# Patient Record
Sex: Female | Born: 1935 | Race: Black or African American | Hispanic: No | Smoking: Former smoker
Health system: Southern US, Community
[De-identification: ages and names within clinical notes are randomized; demographics above are authoritative.]

## PROBLEM LIST (undated history)

## (undated) DIAGNOSIS — M1990 Unspecified osteoarthritis, unspecified site: Secondary | ICD-10-CM

## (undated) DIAGNOSIS — I639 Cerebral infarction, unspecified: Secondary | ICD-10-CM

## (undated) DIAGNOSIS — D649 Anemia, unspecified: Secondary | ICD-10-CM

## (undated) DIAGNOSIS — Z9289 Personal history of other medical treatment: Secondary | ICD-10-CM

## (undated) DIAGNOSIS — M109 Gout, unspecified: Secondary | ICD-10-CM

## (undated) DIAGNOSIS — I10 Essential (primary) hypertension: Secondary | ICD-10-CM

## (undated) DIAGNOSIS — I4891 Unspecified atrial fibrillation: Secondary | ICD-10-CM

## (undated) DIAGNOSIS — I82409 Acute embolism and thrombosis of unspecified deep veins of unspecified lower extremity: Secondary | ICD-10-CM

## (undated) DIAGNOSIS — E119 Type 2 diabetes mellitus without complications: Secondary | ICD-10-CM

## (undated) DIAGNOSIS — H409 Unspecified glaucoma: Secondary | ICD-10-CM

## (undated) DIAGNOSIS — N289 Disorder of kidney and ureter, unspecified: Secondary | ICD-10-CM

## (undated) DIAGNOSIS — I6529 Occlusion and stenosis of unspecified carotid artery: Secondary | ICD-10-CM

## (undated) DIAGNOSIS — K759 Inflammatory liver disease, unspecified: Secondary | ICD-10-CM

## (undated) DIAGNOSIS — E7800 Pure hypercholesterolemia, unspecified: Secondary | ICD-10-CM

## (undated) DIAGNOSIS — I739 Peripheral vascular disease, unspecified: Secondary | ICD-10-CM

## (undated) DIAGNOSIS — I2510 Atherosclerotic heart disease of native coronary artery without angina pectoris: Secondary | ICD-10-CM

## (undated) DIAGNOSIS — N182 Chronic kidney disease, stage 2 (mild): Secondary | ICD-10-CM

## (undated) HISTORY — PX: JOINT REPLACEMENT: SHX530

## (undated) HISTORY — PX: CORONARY ARTERY BYPASS GRAFT: SHX141

## (undated) HISTORY — PX: CARDIAC CATHETERIZATION: SHX172

## (undated) HISTORY — PX: CATARACT EXTRACTION W/ INTRAOCULAR LENS  IMPLANT, BILATERAL: SHX1307

## (undated) HISTORY — PX: CORONARY ANGIOPLASTY: SHX604

---

## 1989-07-22 HISTORY — PX: REPLACEMENT TOTAL KNEE: SUR1224

## 2013-11-21 HISTORY — PX: ATRIAL FIBRILLATION ABLATION: SHX5732

## 2013-11-21 HISTORY — PX: CAROTID ENDARTERECTOMY: SUR193

## 2014-08-21 DIAGNOSIS — I639 Cerebral infarction, unspecified: Secondary | ICD-10-CM

## 2014-08-21 HISTORY — DX: Cerebral infarction, unspecified: I63.9

## 2014-08-31 ENCOUNTER — Emergency Department (HOSPITAL_COMMUNITY)
Admission: EM | Admit: 2014-08-31 | Discharge: 2014-08-31 | Disposition: A | Discharge status: Home / Self Care | Payer: Medicare (Managed Care) | Source: Home / Self Care | Attending: Emergency Medicine | Admitting: Emergency Medicine

## 2014-08-31 ENCOUNTER — Encounter (HOSPITAL_COMMUNITY): Payer: Self-pay | Admitting: Emergency Medicine

## 2014-08-31 ENCOUNTER — Emergency Department (HOSPITAL_COMMUNITY): Payer: Medicare (Managed Care)

## 2014-08-31 ENCOUNTER — Emergency Department (INDEPENDENT_AMBULATORY_CARE_PROVIDER_SITE_OTHER)
Admission: EM | Admit: 2014-08-31 | Discharge: 2014-08-31 | Disposition: A | Discharge status: Nursing Home (Includes ICF) | Payer: PRIVATE HEALTH INSURANCE | Source: Home / Self Care | Attending: Family Medicine | Admitting: Family Medicine

## 2014-08-31 DIAGNOSIS — Z7901 Long term (current) use of anticoagulants: Secondary | ICD-10-CM

## 2014-08-31 DIAGNOSIS — R410 Disorientation, unspecified: Secondary | ICD-10-CM

## 2014-08-31 DIAGNOSIS — Z87448 Personal history of other diseases of urinary system: Secondary | ICD-10-CM

## 2014-08-31 DIAGNOSIS — I4891 Unspecified atrial fibrillation: Secondary | ICD-10-CM | POA: Insufficient documentation

## 2014-08-31 DIAGNOSIS — Z79899 Other long term (current) drug therapy: Secondary | ICD-10-CM | POA: Insufficient documentation

## 2014-08-31 DIAGNOSIS — E119 Type 2 diabetes mellitus without complications: Secondary | ICD-10-CM | POA: Insufficient documentation

## 2014-08-31 DIAGNOSIS — M549 Dorsalgia, unspecified: Secondary | ICD-10-CM | POA: Insufficient documentation

## 2014-08-31 DIAGNOSIS — Z792 Long term (current) use of antibiotics: Secondary | ICD-10-CM

## 2014-08-31 DIAGNOSIS — N3000 Acute cystitis without hematuria: Secondary | ICD-10-CM | POA: Insufficient documentation

## 2014-08-31 DIAGNOSIS — R4182 Altered mental status, unspecified: Secondary | ICD-10-CM | POA: Insufficient documentation

## 2014-08-31 DIAGNOSIS — M5416 Radiculopathy, lumbar region: Secondary | ICD-10-CM

## 2014-08-31 DIAGNOSIS — Z7982 Long term (current) use of aspirin: Secondary | ICD-10-CM | POA: Insufficient documentation

## 2014-08-31 DIAGNOSIS — G9340 Encephalopathy, unspecified: Secondary | ICD-10-CM

## 2014-08-31 DIAGNOSIS — I615 Nontraumatic intracerebral hemorrhage, intraventricular: Secondary | ICD-10-CM | POA: Diagnosis not present

## 2014-08-31 HISTORY — DX: Unspecified atrial fibrillation: I48.91

## 2014-08-31 HISTORY — DX: Disorder of kidney and ureter, unspecified: N28.9

## 2014-08-31 LAB — CBC
HEMATOCRIT: 32.6 % — AB (ref 36.0–46.0)
HEMOGLOBIN: 10.4 g/dL — AB (ref 12.0–15.0)
MCH: 29.3 pg (ref 26.0–34.0)
MCHC: 31.9 g/dL (ref 30.0–36.0)
MCV: 91.8 fL (ref 78.0–100.0)
Platelets: 204 10*3/uL (ref 150–400)
RBC: 3.55 MIL/uL — ABNORMAL LOW (ref 3.87–5.11)
RDW: 17.4 % — ABNORMAL HIGH (ref 11.5–15.5)
WBC: 9.1 10*3/uL (ref 4.0–10.5)

## 2014-08-31 LAB — COMPREHENSIVE METABOLIC PANEL
ALT: 16 U/L (ref 0–35)
ANION GAP: 13 (ref 5–15)
AST: 18 U/L (ref 0–37)
Albumin: 3.5 g/dL (ref 3.5–5.2)
Alkaline Phosphatase: 73 U/L (ref 39–117)
BUN: 24 mg/dL — ABNORMAL HIGH (ref 6–23)
CALCIUM: 9.1 mg/dL (ref 8.4–10.5)
CO2: 23 mEq/L (ref 19–32)
Chloride: 108 mEq/L (ref 96–112)
Creatinine, Ser: 1.37 mg/dL — ABNORMAL HIGH (ref 0.50–1.10)
GFR, EST AFRICAN AMERICAN: 40 mL/min — AB (ref >90)
GFR, EST NON AFRICAN AMERICAN: 34 mL/min — AB (ref >90)
GLUCOSE: 99 mg/dL (ref 70–99)
Potassium: 4.1 mEq/L (ref 3.7–5.3)
Sodium: 144 mEq/L (ref 137–147)
TOTAL PROTEIN: 7.5 g/dL (ref 6.0–8.3)
Total Bilirubin: 0.3 mg/dL (ref 0.3–1.2)

## 2014-08-31 LAB — PROTIME-INR
INR: 1.84 — AB (ref 0.00–1.49)
Prothrombin Time: 21.3 seconds — ABNORMAL HIGH (ref 11.6–15.2)

## 2014-08-31 LAB — URINALYSIS, ROUTINE W REFLEX MICROSCOPIC
BILIRUBIN URINE: NEGATIVE
Glucose, UA: NEGATIVE mg/dL
Ketones, ur: NEGATIVE mg/dL
LEUKOCYTES UA: NEGATIVE
Nitrite: POSITIVE — AB
Specific Gravity, Urine: 1.019 (ref 1.005–1.030)
Urobilinogen, UA: 0.2 mg/dL (ref 0.0–1.0)
pH: 5.5 (ref 5.0–8.0)

## 2014-08-31 LAB — CBG MONITORING, ED: Glucose-Capillary: 94 mg/dL (ref 70–99)

## 2014-08-31 LAB — URINE MICROSCOPIC-ADD ON

## 2014-08-31 LAB — I-STAT CG4 LACTIC ACID, ED: Lactic Acid, Venous: 0.7 mmol/L (ref 0.5–2.2)

## 2014-08-31 LAB — AMMONIA: Ammonia: 21 umol/L (ref 11–60)

## 2014-08-31 MED ORDER — ONDANSETRON HCL 4 MG/2ML IJ SOLN
4.0000 mg | Freq: Once | INTRAMUSCULAR | Status: AC
  Administered 2014-08-31: 4 mg via INTRAVENOUS
  Filled 2014-08-31: qty 2

## 2014-08-31 MED ORDER — ACETAMINOPHEN 500 MG PO TABS
500.0000 mg | ORAL_TABLET | Freq: Once | ORAL | Status: AC
Start: 2014-08-31 — End: 2014-08-31
  Administered 2014-08-31: 500 mg via ORAL
  Filled 2014-08-31: qty 1

## 2014-08-31 MED ORDER — CEPHALEXIN 500 MG PO CAPS: 500.0000 mg | ORAL_CAPSULE | Freq: Four times a day (QID) | ORAL | Status: DC

## 2014-08-31 MED ORDER — DEXTROSE 5 % IV SOLN
1.0000 g | Freq: Once | INTRAVENOUS | Status: AC
  Administered 2014-08-31: 1 g via INTRAVENOUS
  Filled 2014-08-31: qty 10

## 2014-08-31 NOTE — ED Notes (Signed)
The pt vomited after she received the tylenol po.  zofran given for  Nausea.  Family wants to speak with the edp before she is discharged

## 2014-08-31 NOTE — ED Provider Notes (Signed)
Susan Calhoun is a 80 y.o. female who presents to Urgent Care today for right leg pain. Patient is visiting from [place]. She has a history of atrial fibrillation status post ablation currently on warfarin. For the past several days she's had pain radiating from her back down her right leg however this morning she became more confused according to her caregivers and was a little more unsteady on her feet. She denies any chest pains or palpitations or shortness of breath. Her caregivers note that she forgot how to take her medications this morning and cannot remember that she are to them. This is very uncharacteristic of her. No new medications. She notes that she's been having trouble managing her warfarin recently.   Past Medical History  Diagnosis Date  . Diabetes mellitus without complication   . A-fib   . Renal disorder    History  Substance Use Topics  . Smoking status: Never Smoker   . Smokeless tobacco: Not on file  . Alcohol Use: No   ROS as above Medications: No current facility-administered medications for this encounter.   No current outpatient prescriptions on file.    Exam:  BP 166/44  Pulse 58  Temp(Src) 98.7 F (37.1 C) (Oral)  Resp 18  SpO2 99% Gen: Well NAD HEENT: EOMI,  MMM Lungs: Normal work of breathing. CTABL Heart: RRR no MRG Abd: NABS, Soft. Nondistended, Nontender Exts: Brisk capillary refill, warm and well perfused.  Neuro: Alert and oriented to person and place. Patient states that it is September 1975 and on a Sunday. She is not sure about the date of the month.  Back: Nontender to spinal midline Patient is able to stand up on her own and stand on her heels and toes. She is able to squat a little bit. Reflexes are equal bilateral knees and ankles. Sensation is intact throughout Patient has normal bilateral intact strength upper extremities.  No results found for this or any previous visit (from the past 24 hour(s)). No results  found.  Assessment and Plan: 80 y.o. female with right leg radicular pain in the setting of questionably controlled INR and new confusion. The differential at this time is quite large. Plan to transfer to the emergency department for evaluation and management of this issue.  Discussed warning signs or symptoms. Please see discharge instructions. Patient expresses understanding.     Rodolph BongEvan S Corey, MD 08/31/14 [phone]

## 2014-08-31 NOTE — Discharge Instructions (Signed)

## 2014-08-31 NOTE — ED Notes (Addendum)
Family states she is typically independent and has noticed since Wednesday that she has been disoriented, incontinent, and forgetting about her medications. Pt is diabetic, hx of afib, oot from [place] visiting family. Family states this is not her usual. Pt also states the back of both of her legs hurt. Denies any SOB, cp, or palpitations.

## 2014-08-31 NOTE — ED Provider Notes (Signed)
80 y.o. Female with leg pain and some confusion over the past 36 hours.  NO report of trauma or fever.  Patient taking po without difficulty.  Filed Vitals:   08/31/14 2253  BP: 175/59  Pulse: 71  Temp: 98.9 F (37.2 C)  Resp:    WDWN elderly female nad Patient is oriented to person and place but not to date.   Hr- rrr Abdomen- soft nontender Lower extremities with full arom, dtr equal, sensation intact Back- no deformity noted  Patient with le positive, bacteria and wbc on urinalysis.  Patient to be treated for uti.  Family given strict return precautions and need for follow up.  I saw and evaluated the patient, reviewed the resident's note and I agree with the findings and plan.    Hilario Quarryanielle S Avonte Sensabaugh, MD 08/31/14 [phone]

## 2014-08-31 NOTE — ED Notes (Signed)
Pt triaged and assessed by provider 

## 2014-08-31 NOTE — ED Provider Notes (Signed)
CSN: [id number]     Arrival date & time 08/31/14  1738 History   First MD Initiated Contact with Patient 08/31/14 1848     Chief Complaint  Patient presents with  . Leg Pain  . Altered Mental Status     (Consider location/radiation/quality/duration/timing/severity/associated sxs/prior Treatment) HPI Comments: Patient is 80 year old female who is a transfer from urgent care. 10 days ago patient came from [place] to visit family. She's been having bilateral back pain radiating into her thighs she's had before. Denies any numbness or weakness of her legs. Is still able to ambulate without difficulty. Does have some back pain from time to time which is similar. Worse with movement better with rest. She also has some confusion and her family has reported. Patient normally lives by herself and does all her ADLs without difficulty. Family states that she has been a little more confused still able to function. She still oriented. Patient denies any symptoms outside of her back pain. Denies any vomiting diarrhea or abdominal pain. No fevers. Denies chest pain, shortness of breath, hemoptysis. She is on warfarin for A. fib. Denies any leg swelling. No lower leg pain.  Patient is a 80 y.o. female presenting with altered mental status. The history is provided by the patient.  Altered Mental Status Presenting symptoms: confusion   Presenting symptoms: no behavior changes, no combativeness, no disorientation, no lethargy, no partial responsiveness and no unresponsiveness   Severity:  Mild Most recent episode:  2 days ago Episode history:  Continuous Duration:  2 days Timing:  Constant Progression:  Waxing and waning Chronicity:  New Context: not alcohol use, not head injury, not homeless, taking medications as prescribed and not a recent illness   Associated symptoms: no abdominal pain, normal movement, no difficulty breathing, no fever, no nausea, no rash, no vomiting and no weakness     Past  Medical History  Diagnosis Date  . Diabetes mellitus without complication   . A-fib   . Renal disorder    Past Surgical History  Procedure Laterality Date  . Replacement total knee     No family history on file. History  Substance Use Topics  . Smoking status: Never Smoker   . Smokeless tobacco: Not on file  . Alcohol Use: No   OB History   Grav Para Term Preterm Abortions TAB SAB Ect Mult Living                 Review of Systems  Constitutional: Negative for fever, activity change, appetite change and fatigue.  HENT: Negative for congestion and rhinorrhea.   Eyes: Negative for discharge, redness and itching.  Respiratory: Negative for shortness of breath and wheezing.   Cardiovascular: Negative for chest pain.  Gastrointestinal: Negative for nausea, vomiting, abdominal pain and diarrhea.  Genitourinary: Negative for dysuria and hematuria.  Musculoskeletal: Positive for back pain.  Skin: Negative for rash and wound.  Neurological: Negative for syncope and weakness.  Psychiatric/Behavioral: Positive for confusion.      Allergies  Septra  Home Medications   Prior to Admission medications   Medication Sig Start Date End Date Taking? Authorizing Provider  acetaminophen (TYLENOL) 500 MG tablet Take 1,000 mg by mouth 3 (three) times daily as needed (pain).   Yes Historical Provider, MD  allopurinol (ZYLOPRIM) 100 MG tablet Take 100 mg by mouth daily before supper.    Yes Historical Provider, MD  aspirin EC 81 MG tablet Take 81 mg by mouth daily.   Yes Historical  Provider, MD  atorvastatin (LIPITOR) 80 MG tablet Take 80 mg by mouth daily after supper.    Yes Historical Provider, MD  carvedilol (COREG) 25 MG tablet Take 25 mg by mouth 2 (two) times daily with a meal.   Yes Historical Provider, MD  cholecalciferol (VITAMIN D) 1000 UNITS tablet Take 1,000 Units by mouth every other day.   Yes Historical Provider, MD  Cyanocobalamin (VITAMIN B-12 PO) Take 1 tablet by mouth  every other day.   Yes Historical Provider, MD  ferrous sulfate 325 (65 FE) MG tablet Take 325 mg by mouth daily with breakfast.   Yes Historical Provider, MD  furosemide (LASIX) 20 MG tablet Take 20 mg by mouth 2 (two) times daily.    Yes Historical Provider, MD  lisinopril (PRINIVIL,ZESTRIL) 5 MG tablet Take 5 mg by mouth daily.   Yes Historical Provider, MD  metFORMIN (GLUCOPHAGE) 500 MG tablet Take 500 mg by mouth daily with breakfast.    Yes Historical Provider, MD  NIFEdipine (NIFEDICAL XL) 60 MG 24 hr tablet Take 60 mg by mouth daily after supper.    Yes Historical Provider, MD  Travoprost, BAK Free, (TRAVATAN) 0.004 % SOLN ophthalmic solution Place 1 drop into the right eye at bedtime.   Yes Historical Provider, MD  warfarin (COUMADIN) 5 MG tablet Take 5-10 mg by mouth daily after supper. Take 2 tablets (10 mg) on Monday, then take 1 tablet (5 mg) on Tuesday thru Sunday   Yes Historical Provider, MD  cephALEXin (KEFLEX) 500 MG capsule Take 1 capsule (500 mg total) by mouth 4 (four) times daily. 08/31/14   Pilar Jarvisoug Jesiel Garate, MD   BP 110/91  Pulse 73  Temp(Src) 99.1 F (37.3 C) (Rectal)  Resp 18  Ht 4\' 11"  (1.499 m)  Wt 140 lb (63.504 kg)  BMI 28.26 kg/m2  SpO2 100% Physical Exam  Constitutional: She is oriented to person, place, and time. She appears well-developed and well-nourished. No distress.  No acute distress, pleasant, nontoxic  HENT:  Head: Normocephalic and atraumatic.  Mouth/Throat: Oropharynx is clear and moist. No oropharyngeal exudate.  Eyes: Conjunctivae and EOM are normal. Pupils are equal, round, and reactive to light. Right eye exhibits no discharge. Left eye exhibits no discharge. No scleral icterus.  Neck: Normal range of motion. Neck supple.  Cardiovascular: Normal rate, regular rhythm and normal heart sounds.   No murmur heard. Pulmonary/Chest: Effort normal and breath sounds normal. No respiratory distress. She has no wheezes. She has no rales.  Abdominal: Soft.  She exhibits no distension and no mass. There is no tenderness.  Musculoskeletal:  Mild bilateral lower back (lumbosacral )ttp. No midline ttp. Pain reproduced with moving legs.  No CVA tenderness to palpation  Neurological: She is alert and oriented to person, place, and time. She exhibits normal muscle tone. Coordination normal.  5/5 strength in all exts Normal sensation in all exts 2+ DTRs in patella and brachioradilias b/l Alert oriented x3. Has some mild confusion with complex questions but gives full history and is very pleasant  Skin: Skin is warm. No rash noted. She is not diaphoretic.    ED Course  Procedures (including critical care time) Labs Review Labs Reviewed  CBC - Abnormal; Notable for the following:    RBC 3.55 (*)    Hemoglobin 10.4 (*)    HCT 32.6 (*)    RDW 17.4 (*)    All other components within normal limits  COMPREHENSIVE METABOLIC PANEL - Abnormal; Notable for the following:  BUN 24 (*)    Creatinine, Ser 1.37 (*)    GFR calc non Af Amer 34 (*)    GFR calc Af Amer 40 (*)    All other components within normal limits  PROTIME-INR - Abnormal; Notable for the following:    Prothrombin Time 21.3 (*)    INR 1.84 (*)    All other components within normal limits  URINALYSIS, ROUTINE W REFLEX MICROSCOPIC - Abnormal; Notable for the following:    APPearance CLOUDY (*)    Hgb urine dipstick SMALL (*)    Protein, ur >300 (*)    Nitrite POSITIVE (*)    All other components within normal limits  URINE MICROSCOPIC-ADD ON - Abnormal; Notable for the following:    Squamous Epithelial / LPF FEW (*)    Bacteria, UA MANY (*)    All other components within normal limits  AMMONIA  CBG MONITORING, ED  I-STAT CG4 LACTIC ACID, ED    Imaging Review Dg Chest 2 View  08/31/2014   CLINICAL DATA:  Disoriented. History of atrial fibrillation. Initial encounter.  EXAM: CHEST  2 VIEW  COMPARISON:  None.  FINDINGS: Borderline enlarged cardiac silhouette and mediastinal  contours with atherosclerotic plaque within the thoracic aorta. Post median sternotomy. Evaluation retrosternal clear space obscured secondary to overlying soft tissues. The lungs appear mildly hyperexpanded with mild diffuse slightly nodular thickening of the pulmonary interstitium. No focal airspace opacities. No pleural effusion or pneumothorax. No evidence of edema. No acute osseus abnormalities.  IMPRESSION: Borderline cardiomegaly and lung hyperexpansion without acute cardiopulmonary disease.   Electronically Signed   By: Simonne Come M.D.   On: 08/31/2014 21:03   Ct Head Wo Contrast  08/31/2014   CLINICAL DATA:  Disoriented.  Initial encounter.  EXAM: CT HEAD WITHOUT CONTRAST  TECHNIQUE: Contiguous axial images were obtained from the base of the skull through the vertex without intravenous contrast.  COMPARISON:  None.  FINDINGS: There is mild, likely age-appropriate, centralized volume loss with mild ex vacuo dilatation of the ventricular system. Gray-white differentiation is maintained without CT evidence of acute large territory infarct. No intraparenchymal or extra-axial mass or hemorrhage. Intracranial atherosclerosis. Limited visualization of the paranasal sinuses and mastoid air cells is normal. No air-fluid levels. Note is made of mild hyperostosis frontalis. Post bilateral cataract surgery. Regional soft tissues appear normal. No displaced calvarial fracture.  IMPRESSION: Mild, likely age-appropriate, centralized volume loss without acute intracranial process.   Electronically Signed   By: Simonne Come M.D.   On: 08/31/2014 21:01     EKG Interpretation None      MDM   MDM: 80 year old female presents today for bilateral back pain as well as mild confusion. Patient is well-appearing nontoxic has a mild confusion. She's alert she's oriented has no other neurological deficits. She's here with her family as they're traveling from [place]. Normally she does all ADLs herself. She is afebrile  and has normal vitals. Nontoxic. Patient is on Coumadin for A. fib but has no head trauma. Concern for bleed we'll check head CT. Will check labs and urine for infectious versus metabolic cause. Head CT negative. Chest x-ray negative. Labs largely unremarkable. Urine shows likely GI. Covered with Rocephin 1 mg while in emergency department. We'll treat with by mouth course of antibiotics. Patient has reliable family around the clock as she is on medication. He understands to bring her back if she has worsening mental status, vomiting, or any concerning symptoms. She should see her Dr. when  she gets back to [place] regardless. Discharged.  Final diagnoses:  Altered mental state  Acute cystitis without hematuria    New Prescriptions   CEPHALEXIN (KEFLEX) 500 MG CAPSULE    Take 1 capsule (500 mg total) by mouth 4 (four) times daily.   [facility] EMERGENCY DEPARTMENT [id number] [place] [address] [phone]  As needed  Pcp Not In System  In 1 week    Discharged  Pilar Jarvis, MD 08/31/14 2246

## 2014-08-31 NOTE — ED Notes (Signed)
CBG Taken =  94 

## 2014-08-31 NOTE — ED Notes (Signed)
Up to the br 

## 2014-09-01 ENCOUNTER — Encounter (HOSPITAL_COMMUNITY): Payer: Self-pay | Admitting: Family Medicine

## 2014-09-01 ENCOUNTER — Inpatient Hospital Stay (HOSPITAL_COMMUNITY)
Admission: EM | Admit: 2014-09-01 | Discharge: 2014-09-30 | DRG: 023 | Disposition: A | Discharge status: Skilled Nursing Facility | Payer: Medicare (Managed Care) | Attending: Neurology | Admitting: Neurology

## 2014-09-01 DIAGNOSIS — R059 Cough, unspecified: Secondary | ICD-10-CM

## 2014-09-01 DIAGNOSIS — Z9289 Personal history of other medical treatment: Secondary | ICD-10-CM

## 2014-09-01 DIAGNOSIS — Z96651 Presence of right artificial knee joint: Secondary | ICD-10-CM | POA: Diagnosis present

## 2014-09-01 DIAGNOSIS — I2510 Atherosclerotic heart disease of native coronary artery without angina pectoris: Secondary | ICD-10-CM | POA: Diagnosis present

## 2014-09-01 DIAGNOSIS — I6529 Occlusion and stenosis of unspecified carotid artery: Secondary | ICD-10-CM | POA: Diagnosis present

## 2014-09-01 DIAGNOSIS — Z4659 Encounter for fitting and adjustment of other gastrointestinal appliance and device: Secondary | ICD-10-CM

## 2014-09-01 DIAGNOSIS — G039 Meningitis, unspecified: Secondary | ICD-10-CM

## 2014-09-01 DIAGNOSIS — R251 Tremor, unspecified: Secondary | ICD-10-CM

## 2014-09-01 DIAGNOSIS — I10 Essential (primary) hypertension: Secondary | ICD-10-CM | POA: Diagnosis present

## 2014-09-01 DIAGNOSIS — N189 Chronic kidney disease, unspecified: Secondary | ICD-10-CM | POA: Diagnosis present

## 2014-09-01 DIAGNOSIS — E1342 Other specified diabetes mellitus with diabetic polyneuropathy: Secondary | ICD-10-CM

## 2014-09-01 DIAGNOSIS — I482 Chronic atrial fibrillation, unspecified: Secondary | ICD-10-CM

## 2014-09-01 DIAGNOSIS — H409 Unspecified glaucoma: Secondary | ICD-10-CM | POA: Diagnosis present

## 2014-09-01 DIAGNOSIS — Z951 Presence of aortocoronary bypass graft: Secondary | ICD-10-CM

## 2014-09-01 DIAGNOSIS — T45515A Adverse effect of anticoagulants, initial encounter: Secondary | ICD-10-CM | POA: Diagnosis present

## 2014-09-01 DIAGNOSIS — N12 Tubulo-interstitial nephritis, not specified as acute or chronic: Secondary | ICD-10-CM | POA: Diagnosis present

## 2014-09-01 DIAGNOSIS — R05 Cough: Secondary | ICD-10-CM

## 2014-09-01 DIAGNOSIS — I4891 Unspecified atrial fibrillation: Secondary | ICD-10-CM

## 2014-09-01 DIAGNOSIS — I454 Nonspecific intraventricular block: Secondary | ICD-10-CM | POA: Diagnosis not present

## 2014-09-01 DIAGNOSIS — R40244 Other coma, without documented Glasgow coma scale score, or with partial score reported, unspecified time: Secondary | ICD-10-CM

## 2014-09-01 DIAGNOSIS — N170 Acute kidney failure with tubular necrosis: Secondary | ICD-10-CM | POA: Diagnosis present

## 2014-09-01 DIAGNOSIS — R531 Weakness: Secondary | ICD-10-CM | POA: Insufficient documentation

## 2014-09-01 DIAGNOSIS — R401 Stupor: Secondary | ICD-10-CM

## 2014-09-01 DIAGNOSIS — R0602 Shortness of breath: Secondary | ICD-10-CM

## 2014-09-01 DIAGNOSIS — E870 Hyperosmolality and hypernatremia: Secondary | ICD-10-CM | POA: Diagnosis not present

## 2014-09-01 DIAGNOSIS — Z6829 Body mass index (BMI) 29.0-29.9, adult: Secondary | ICD-10-CM

## 2014-09-01 DIAGNOSIS — N181 Chronic kidney disease, stage 1: Secondary | ICD-10-CM

## 2014-09-01 DIAGNOSIS — I472 Ventricular tachycardia, unspecified: Secondary | ICD-10-CM

## 2014-09-01 DIAGNOSIS — I5033 Acute on chronic diastolic (congestive) heart failure: Secondary | ICD-10-CM | POA: Diagnosis present

## 2014-09-01 DIAGNOSIS — R4182 Altered mental status, unspecified: Secondary | ICD-10-CM

## 2014-09-01 DIAGNOSIS — Z7901 Long term (current) use of anticoagulants: Secondary | ICD-10-CM

## 2014-09-01 DIAGNOSIS — I480 Paroxysmal atrial fibrillation: Secondary | ICD-10-CM

## 2014-09-01 DIAGNOSIS — N390 Urinary tract infection, site not specified: Secondary | ICD-10-CM

## 2014-09-01 DIAGNOSIS — I639 Cerebral infarction, unspecified: Secondary | ICD-10-CM

## 2014-09-01 DIAGNOSIS — I619 Nontraumatic intracerebral hemorrhage, unspecified: Secondary | ICD-10-CM

## 2014-09-01 DIAGNOSIS — E785 Hyperlipidemia, unspecified: Secondary | ICD-10-CM | POA: Diagnosis present

## 2014-09-01 DIAGNOSIS — E669 Obesity, unspecified: Secondary | ICD-10-CM | POA: Diagnosis present

## 2014-09-01 DIAGNOSIS — N10 Acute tubulo-interstitial nephritis: Secondary | ICD-10-CM

## 2014-09-01 DIAGNOSIS — E119 Type 2 diabetes mellitus without complications: Secondary | ICD-10-CM

## 2014-09-01 DIAGNOSIS — G9340 Encephalopathy, unspecified: Secondary | ICD-10-CM

## 2014-09-01 DIAGNOSIS — E860 Dehydration: Secondary | ICD-10-CM | POA: Diagnosis present

## 2014-09-01 DIAGNOSIS — I4819 Other persistent atrial fibrillation: Secondary | ICD-10-CM

## 2014-09-01 DIAGNOSIS — R400 Somnolence: Secondary | ICD-10-CM

## 2014-09-01 DIAGNOSIS — J9811 Atelectasis: Secondary | ICD-10-CM | POA: Diagnosis present

## 2014-09-01 DIAGNOSIS — K9429 Other complications of gastrostomy: Secondary | ICD-10-CM

## 2014-09-01 DIAGNOSIS — G919 Hydrocephalus, unspecified: Secondary | ICD-10-CM

## 2014-09-01 DIAGNOSIS — I509 Heart failure, unspecified: Secondary | ICD-10-CM

## 2014-09-01 DIAGNOSIS — E46 Unspecified protein-calorie malnutrition: Secondary | ICD-10-CM

## 2014-09-01 DIAGNOSIS — E876 Hypokalemia: Secondary | ICD-10-CM

## 2014-09-01 DIAGNOSIS — I5031 Acute diastolic (congestive) heart failure: Secondary | ICD-10-CM

## 2014-09-01 DIAGNOSIS — R000 Tachycardia, unspecified: Secondary | ICD-10-CM

## 2014-09-01 DIAGNOSIS — I615 Nontraumatic intracerebral hemorrhage, intraventricular: Principal | ICD-10-CM

## 2014-09-01 DIAGNOSIS — I739 Peripheral vascular disease, unspecified: Secondary | ICD-10-CM | POA: Diagnosis present

## 2014-09-01 DIAGNOSIS — Z95828 Presence of other vascular implants and grafts: Secondary | ICD-10-CM

## 2014-09-01 DIAGNOSIS — R509 Fever, unspecified: Secondary | ICD-10-CM

## 2014-09-01 DIAGNOSIS — E1142 Type 2 diabetes mellitus with diabetic polyneuropathy: Secondary | ICD-10-CM

## 2014-09-01 DIAGNOSIS — Z113 Encounter for screening for infections with a predominantly sexual mode of transmission: Secondary | ICD-10-CM

## 2014-09-01 DIAGNOSIS — I129 Hypertensive chronic kidney disease with stage 1 through stage 4 chronic kidney disease, or unspecified chronic kidney disease: Secondary | ICD-10-CM | POA: Diagnosis present

## 2014-09-01 DIAGNOSIS — D649 Anemia, unspecified: Secondary | ICD-10-CM | POA: Diagnosis present

## 2014-09-01 DIAGNOSIS — R1310 Dysphagia, unspecified: Secondary | ICD-10-CM | POA: Diagnosis present

## 2014-09-01 DIAGNOSIS — E871 Hypo-osmolality and hyponatremia: Secondary | ICD-10-CM | POA: Diagnosis not present

## 2014-09-01 HISTORY — DX: Essential (primary) hypertension: I10

## 2014-09-01 HISTORY — DX: Unspecified glaucoma: H40.9

## 2014-09-01 HISTORY — DX: Peripheral vascular disease, unspecified: I73.9

## 2014-09-01 HISTORY — DX: Occlusion and stenosis of unspecified carotid artery: I65.29

## 2014-09-01 LAB — URINE MICROSCOPIC-ADD ON

## 2014-09-01 LAB — URINALYSIS, ROUTINE W REFLEX MICROSCOPIC
Bilirubin Urine: NEGATIVE
Glucose, UA: NEGATIVE mg/dL
Ketones, ur: NEGATIVE mg/dL
LEUKOCYTES UA: NEGATIVE
NITRITE: NEGATIVE
Protein, ur: >300 mg/dL — AB
Specific Gravity, Urine: 1.018 (ref 1.005–1.030)
Urobilinogen, UA: 0.2 mg/dL (ref 0.0–1.0)
pH: 6 (ref 5.0–8.0)

## 2014-09-01 LAB — PROTIME-INR
INR: 1.51 — ABNORMAL HIGH (ref 0.00–1.49)
Prothrombin Time: 18.3 seconds — ABNORMAL HIGH (ref 11.6–15.2)

## 2014-09-01 LAB — COMPREHENSIVE METABOLIC PANEL
ALT: 17 U/L (ref 0–35)
AST: 22 U/L (ref 0–37)
Albumin: 3.5 g/dL (ref 3.5–5.2)
Alkaline Phosphatase: 81 U/L (ref 39–117)
Anion gap: 17 — ABNORMAL HIGH (ref 5–15)
BUN: 18 mg/dL (ref 6–23)
CO2: 20 meq/L (ref 19–32)
CREATININE: 1.04 mg/dL (ref 0.50–1.10)
Calcium: 9.4 mg/dL (ref 8.4–10.5)
Chloride: 103 mEq/L (ref 96–112)
GFR, EST AFRICAN AMERICAN: 56 mL/min — AB (ref >90)
GFR, EST NON AFRICAN AMERICAN: 48 mL/min — AB (ref >90)
Glucose, Bld: 129 mg/dL — ABNORMAL HIGH (ref 70–99)
Potassium: 4.1 mEq/L (ref 3.7–5.3)
Sodium: 140 mEq/L (ref 137–147)
Total Bilirubin: 0.5 mg/dL (ref 0.3–1.2)
Total Protein: 8.1 g/dL (ref 6.0–8.3)

## 2014-09-01 LAB — CBC WITH DIFFERENTIAL/PLATELET
Basophils Absolute: 0 10*3/uL (ref 0.0–0.1)
Basophils Relative: 0 % (ref 0–1)
EOS PCT: 0 % (ref 0–5)
Eosinophils Absolute: 0 10*3/uL (ref 0.0–0.7)
HEMATOCRIT: 39.2 % (ref 36.0–46.0)
Hemoglobin: 12.9 g/dL (ref 12.0–15.0)
LYMPHS PCT: 12 % (ref 12–46)
Lymphs Abs: 1.2 10*3/uL (ref 0.7–4.0)
MCH: 29.8 pg (ref 26.0–34.0)
MCHC: 32.9 g/dL (ref 30.0–36.0)
MCV: 90.5 fL (ref 78.0–100.0)
MONO ABS: 0.3 10*3/uL (ref 0.1–1.0)
Monocytes Relative: 3 % (ref 3–12)
Neutro Abs: 8.4 10*3/uL — ABNORMAL HIGH (ref 1.7–7.7)
Neutrophils Relative %: 85 % — ABNORMAL HIGH (ref 43–77)
Platelets: 252 10*3/uL (ref 150–400)
RBC: 4.33 MIL/uL (ref 3.87–5.11)
RDW: 17 % — AB (ref 11.5–15.5)
WBC: 9.9 10*3/uL (ref 4.0–10.5)

## 2014-09-01 LAB — I-STAT CG4 LACTIC ACID, ED: Lactic Acid, Venous: 0.74 mmol/L (ref 0.5–2.2)

## 2014-09-01 LAB — AMMONIA: AMMONIA: 20 umol/L (ref 11–60)

## 2014-09-01 LAB — TROPONIN I: Troponin I: <0.3 ng/mL (ref <0.30)

## 2014-09-01 MED ORDER — WARFARIN SODIUM 2.5 MG PO TABS
2.5000 mg | ORAL_TABLET | Freq: Once | ORAL | Status: DC
  Filled 2014-09-01: qty 1

## 2014-09-01 MED ORDER — ALLOPURINOL 100 MG PO TABS
100.0000 mg | ORAL_TABLET | Freq: Two times a day (BID) | ORAL | Status: DC
  Filled 2014-09-01 (×5): qty 1

## 2014-09-01 MED ORDER — FUROSEMIDE 20 MG PO TABS
20.0000 mg | ORAL_TABLET | Freq: Two times a day (BID) | ORAL | Status: DC
  Filled 2014-09-01 (×5): qty 1

## 2014-09-01 MED ORDER — ACETAMINOPHEN 325 MG PO TABS
650.0000 mg | ORAL_TABLET | Freq: Four times a day (QID) | ORAL | Status: DC | PRN
  Administered 2014-09-04 – 2014-09-24 (×9): 650 mg via ORAL
  Filled 2014-09-01 (×9): qty 2

## 2014-09-01 MED ORDER — LATANOPROST 0.005 % OP SOLN
1.0000 [drp] | Freq: Every day | OPHTHALMIC | Status: DC
  Filled 2014-09-01: qty 2.5

## 2014-09-01 MED ORDER — WARFARIN - PHARMACIST DOSING INPATIENT: Freq: Every day | Status: DC

## 2014-09-01 MED ORDER — LEVOFLOXACIN IN D5W 750 MG/150ML IV SOLN
750.0000 mg | INTRAVENOUS | Status: DC
  Administered 2014-09-01: 750 mg via INTRAVENOUS
  Filled 2014-09-01: qty 150

## 2014-09-01 MED ORDER — ASPIRIN EC 81 MG PO TBEC
81.0000 mg | DELAYED_RELEASE_TABLET | Freq: Every day | ORAL | Status: DC
  Filled 2014-09-01 (×3): qty 1

## 2014-09-01 MED ORDER — CARVEDILOL 25 MG PO TABS
25.0000 mg | ORAL_TABLET | Freq: Two times a day (BID) | ORAL | Status: DC
  Filled 2014-09-01 (×5): qty 1

## 2014-09-01 MED ORDER — NIFEDIPINE ER 60 MG PO TB24
60.0000 mg | ORAL_TABLET | Freq: Every day | ORAL | Status: DC
  Filled 2014-09-01 (×2): qty 1

## 2014-09-01 MED ORDER — ACETAMINOPHEN 650 MG RE SUPP: 650.0000 mg | Freq: Four times a day (QID) | RECTAL | Status: DC | PRN

## 2014-09-01 MED ORDER — LISINOPRIL 5 MG PO TABS
5.0000 mg | ORAL_TABLET | Freq: Every day | ORAL | Status: DC
  Filled 2014-09-01 (×3): qty 1

## 2014-09-01 MED ORDER — SODIUM CHLORIDE 0.9 % IV SOLN
INTRAVENOUS | Status: DC
  Administered 2014-09-01: 1000 mL via INTRAVENOUS
  Administered 2014-09-02 – 2014-09-03 (×2): via INTRAVENOUS
  Administered 2014-09-03: 1000 mL via INTRAVENOUS
  Administered 2014-09-05 (×2): via INTRAVENOUS

## 2014-09-01 MED ORDER — ATORVASTATIN CALCIUM 80 MG PO TABS
80.0000 mg | ORAL_TABLET | Freq: Every day | ORAL | Status: DC
  Administered 2014-09-04 – 2014-09-05 (×2): 80 mg via ORAL
  Filled 2014-09-01 (×5): qty 1

## 2014-09-01 MED ORDER — INSULIN ASPART 100 UNIT/ML ~~LOC~~ SOLN
0.0000 [IU] | Freq: Three times a day (TID) | SUBCUTANEOUS | Status: DC
  Administered 2014-09-02 – 2014-09-03 (×3): 1 [IU] via SUBCUTANEOUS

## 2014-09-01 NOTE — Progress Notes (Signed)
ANTICOAGULATION CONSULT NOTE - Initial Consult  Pharmacy Consult for Warfarin Indication: atrial fibrillation  Allergies  Allergen Reactions  . Septra [Sulfamethoxazole-Trimethoprim] Hives    Patient Measurements: Height: 4\' 11"  (149.9 cm) Weight: 139 lb 15.9 oz (63.5 kg) IBW/kg (Calculated) : 43.2  Vital Signs: Temp: 98.3 F (36.8 C) (10/12 2040) Temp Source: Axillary (10/12 2040) BP: 127/76 mmHg (10/12 2040) Pulse Rate: 81 (10/12 2040)  Labs:  Recent Labs  08/31/14 1807 09/01/14 1648  HGB 10.4* 12.9  HCT 32.6* 39.2  PLT 204 252  LABPROT 21.3* 18.3*  INR 1.84* 1.51*  CREATININE 1.37* 1.04  TROPONINI  --  <0.30    Estimated Creatinine Clearance: 32.6 ml/min (by C-G formula based on Cr of 1.04).   Medical History: Past Medical History  Diagnosis Date  . Diabetes mellitus without complication   . A-fib   . Renal disorder     Medications:  Scheduled:  . allopurinol  100 mg Oral BID  . [START ON 09/02/2014] aspirin EC  81 mg Oral Q breakfast  . [START ON 09/02/2014] atorvastatin  80 mg Oral QPC supper  . [START ON 09/02/2014] carvedilol  25 mg Oral BID WC  . [START ON 09/02/2014] furosemide  20 mg Oral BID  . [START ON 09/02/2014] insulin aspart  0-9 Units Subcutaneous TID WC  . latanoprost  1 drop Both Eyes QHS  . levofloxacin (LEVAQUIN) IV  750 mg Intravenous Q24H  . [START ON 09/02/2014] lisinopril  5 mg Oral Q breakfast  . NIFEdipine  60 mg Oral QPC supper   Infusions:  . sodium chloride 1,000 mL (09/01/14 2101)    Assessment: [age] yoF admitted on 10/12 with AMS, suspected UTI.  She is on chronic warfarin anticoagulation for atrial fibrillation.  She takes 5mg  daily except 10mg  mondays, with her last dose taken on 10/12 (10mg ).  Pharmacy is consulted to continue warfarin dosing inpatient.  INR: 1.51, subtherapeutic CBC: 12.9, Plt 252 Drug-drug interactions:  Levaquin will prolong INR.   Goal of Therapy:  INR 2-3 Monitor platelets by  anticoagulation protocol: Yes   Plan:   Warfarin dose 10mg  taken PTA.  For subtherapeutic INR, take additional 2.5mg  PO today once.  (Caution dose increase due to expected levaquin interaction.)  Daily INR  Lynann Beaverhristine Daneille Desilva PharmD, BCPS Pager [phone] 09/01/2014 9:18 PM

## 2014-09-01 NOTE — H&P (Addendum)
[facility] History and Physical  Brande Uncapher WUJ:[id number] DOB: 12/03/1927 DOA: 09/01/2014  Referring physician: Dr. Blinda Leatherwood PCP: Pcp Not In System  Specialists: none  Chief Complaint: Confusion  Assessment/Plan Active Problems:   Acute encephalopathy UTI HTN Afib DM CKD HLD  Acute Encephalopathy: No worse than on day of presentation. No focal deficits. Pt answers questions appropriately but is primarily tired adn un able to care for self. Likely secondary to UTI/Pyelo. Pt has not received any ABX since being seen in the ED the day prior to admission. Polypharmacy is also a posibility but less likely. No sign of stroke and pt anticoagulated. CT scan from 10/11, nml. Lactic Acid 0.74. WBC 9.9. BUN nml. Glucose 129. CXR w/o PNA.  - Admit for Obs - Pyelo workup below - Ammonia - BCX, UCX (low yield as not done prior to ABX) - Bedside swallow eval, advance diet as tolerated  Pyelonephritis: UA improved on admission compared to 10/11. CTX x 1 in ED on 10/11. No UCX done. No CVA tenderness today. WBC 9.9. Renal function returned to normal on todays admission - IV Levaquin (monitor dosing and consider changing when improving due to interaction w/ coumadin). PHarmacy to assist - UCX as above - NS 43ml/hr - Tylenol PRN  HTN: SBP 164-189. Pt has not had medications in over a day - Restart home carvedilol, nifedipine, lasix, lisinopril,    Afib: Rate controlled and on anticoagulation. subtherapeutic INR 1.59 - Continue home Nifedipine, carvedilol - Warfarin   DM: on metformin at home - Hold metformin during admission - SSI - A1c  CKD: No evidence of CKD on admission. Acute kidney injury from 10/11 from pyelo, resolved. Nml Cr 1.04.  - IVF as above - BMET in the am  DVT Prophylaxis: FUll anticoagulation due to Afib  Code Status: FULL Family Communication: Daughter adn Chief Technology Officer at bedside.  Disposition Plan: Pending improvement  HPI: Susan Calhoun  is a 80 y.o. female came to [facility] ed 09/01/2014 with  Altered Mental Status. Came down from [place] 12 days ago to visit. Started to devleop confusion, refusal eat, fatigue and inability to care for self. At baseline pt is able to live independently. Denies any CP, SOB, palpitations, abd pain, dysuria, frequency. Went to ED last night and treated for UTI. Given Rocephin last night in ED. Rx for Keflex given but pt has not taken this as she has not been awake enough to take the medication. Has not taken any medications since yesterday.   Review of Systems: Per HPI w/ all other systems negative.   Past Medical History  Diagnosis Date  . Diabetes mellitus without complication   . A-fib   . Renal disorder    Past Surgical History  Procedure Laterality Date  . Replacement total knee     Social History:  History   Social History Narrative  . No narrative on file    Allergies  Allergen Reactions  . Septra [Sulfamethoxazole-Trimethoprim] Hives     Prior to Admission medications   Medication Sig Start Date End Date Taking? Authorizing Provider  acetaminophen (TYLENOL) 500 MG tablet Take 1,000 mg by mouth 3 (three) times daily as needed (pain).   Yes Historical Provider, MD  allopurinol (ZYLOPRIM) 100 MG tablet Take 100 mg by mouth 2 (two) times daily.    Yes Historical Provider, MD  aspirin EC 81 MG tablet Take 81 mg by mouth daily with breakfast.    Yes Historical Provider, MD  atorvastatin (LIPITOR) 80 MG tablet  Take 80 mg by mouth daily after supper.    Yes Historical Provider, MD  carvedilol (COREG) 25 MG tablet Take 25 mg by mouth 2 (two) times daily with a meal.   Yes Historical Provider, MD  cephALEXin (KEFLEX) 500 MG capsule Take 1 capsule (500 mg total) by mouth 4 (four) times daily. 08/31/14  Yes Pilar Jarvisoug Brtalik, MD  cholecalciferol (VITAMIN D) 1000 UNITS tablet Take 1,000 Units by mouth every other day.   Yes Historical Provider, MD  Cyanocobalamin (VITAMIN B-12 PO) Take 1 tablet by mouth  every other day.   Yes Historical Provider, MD  ferrous sulfate 325 (65 FE) MG tablet Take 325 mg by mouth daily with breakfast.   Yes Historical Provider, MD  furosemide (LASIX) 20 MG tablet Take 20 mg by mouth 2 (two) times daily.    Yes Historical Provider, MD  lisinopril (PRINIVIL,ZESTRIL) 5 MG tablet Take 5 mg by mouth daily with breakfast.    Yes Historical Provider, MD  metFORMIN (GLUCOPHAGE) 500 MG tablet Take 500 mg by mouth daily with breakfast.    Yes Historical Provider, MD  NIFEdipine (NIFEDICAL XL) 60 MG 24 hr tablet Take 60 mg by mouth daily after supper.    Yes Historical Provider, MD  Travoprost, BAK Free, (TRAVATAN) 0.004 % SOLN ophthalmic solution Place 1 drop into the right eye at bedtime.   Yes Historical Provider, MD  warfarin (COUMADIN) 5 MG tablet Take 5-10 mg by mouth daily after supper. Take 2 tablets (10 mg) on Monday, then take 1 tablet (5 mg) on Tuesday thru Sunday   Yes Historical Provider, MD   Physical Exam: Filed Vitals:   09/01/14 1700 09/01/14 1730 09/01/14 1800 09/01/14 1830  BP: 188/54 175/53 164/98 187/80  Pulse:      Temp:      TempSrc:      Resp: 20 24 21 26   SpO2:         General:  Sleeping but arousable. elderly  Eyes: EOMI, PERRL  ENT: numerous missing teeth, mmm,  Neck: no JVD, FROM  Cardiovascular: RRR, III/VI systolic murmur  Respiratory: Nml WOB. No wheezes, ronchi. Good breath sounds throughout  Abdomen: NABS, nonttp  Skin: warm, well perfused, intact  Musculoskeletal: No LE edema, moves all extremities spontaneously   Psychiatric: sleepy, but responds to commands w/ yest and no appropriately  Neurologic: CN2-12 Grossly intact,   Labs on Admission:  Basic Metabolic Panel:  Recent Labs Lab 08/31/14 1807 09/01/14 1648  NA 144 140  K 4.1 4.1  CL 108 103  CO2 23 20  GLUCOSE 99 129*  BUN 24* 18  CREATININE 1.37* 1.04  CALCIUM 9.1 9.4   Liver Function Tests:  Recent Labs Lab 08/31/14 1807 09/01/14 1648  AST 18  22  ALT 16 17  ALKPHOS 73 81  BILITOT 0.3 0.5  PROT 7.5 8.1  ALBUMIN 3.5 3.5   No results found for this basename: LIPASE, AMYLASE,  in the last 168 hours  Recent Labs Lab 08/31/14 2107  AMMONIA 21   CBC:  Recent Labs Lab 08/31/14 1807 09/01/14 1648  WBC 9.1 9.9  NEUTROABS  --  8.4*  HGB 10.4* 12.9  HCT 32.6* 39.2  MCV 91.8 90.5  PLT 204 252   Cardiac Enzymes:  Recent Labs Lab 09/01/14 1648  TROPONINI <0.30    BNP (last 3 results) No results found for this basename: PROBNP,  in the last 8760 hours CBG:  Recent Labs Lab 08/31/14 1926  GLUCAP 94  Radiological Exams on Admission: Dg Chest 2 View  08/31/2014   CLINICAL DATA:  Disoriented. History of atrial fibrillation. Initial encounter.  EXAM: CHEST  2 VIEW  COMPARISON:  None.  FINDINGS: Borderline enlarged cardiac silhouette and mediastinal contours with atherosclerotic plaque within the thoracic aorta. Post median sternotomy. Evaluation retrosternal clear space obscured secondary to overlying soft tissues. The lungs appear mildly hyperexpanded with mild diffuse slightly nodular thickening of the pulmonary interstitium. No focal airspace opacities. No pleural effusion or pneumothorax. No evidence of edema. No acute osseus abnormalities.  IMPRESSION: Borderline cardiomegaly and lung hyperexpansion without acute cardiopulmonary disease.   Electronically Signed   By: Simonne ComeJohn  Watts M.D.   On: 08/31/2014 21:03   Ct Head Wo Contrast  08/31/2014   CLINICAL DATA:  Disoriented.  Initial encounter.  EXAM: CT HEAD WITHOUT CONTRAST  TECHNIQUE: Contiguous axial images were obtained from the base of the skull through the vertex without intravenous contrast.  COMPARISON:  None.  FINDINGS: There is mild, likely age-appropriate, centralized volume loss with mild ex vacuo dilatation of the ventricular system. Gray-white differentiation is maintained without CT evidence of acute large territory infarct. No intraparenchymal or  extra-axial mass or hemorrhage. Intracranial atherosclerosis. Limited visualization of the paranasal sinuses and mastoid air cells is normal. No air-fluid levels. Note is made of mild hyperostosis frontalis. Post bilateral cataract surgery. Regional soft tissues appear normal. No displaced calvarial fracture.  IMPRESSION: Mild, likely age-appropriate, centralized volume loss without acute intracranial process.   Electronically Signed   By: Simonne ComeJohn  Watts M.D.   On: 08/31/2014 21:01       Time spent: >70 min in direct pt care and coordination   Corisa Montini J, MD [facility] [email or web address] Password [email or web address] 09/01/2014, 7:08 PM

## 2014-09-01 NOTE — ED Provider Notes (Signed)
CSN: [id number]     Arrival date & time 09/01/14  1451 History   First MD Initiated Contact with Patient 09/01/14 1520     Chief Complaint  Patient presents with  . Altered Mental Status  . Urinary Tract Infection     (Consider location/radiation/quality/duration/timing/severity/associated sxs/prior Treatment) HPI Comments: Patient brought to the emergency department by family for evaluation of confusion. Patient was seen in [facility] emergency department yesterday for back pain and mild confusion. She was diagnosed with urinary tract infection. Patient was administered Rocephin and discharged. Family reports that since discharge she has had progressive decline of her mental status. She is much less alert than normally. She has not been able to get up to eat, drink or take any of her medications. Family report low-grade fever earlier. He reports that they called the primary doctor who suggested coming back to the ER for admission because of her progressive decline.  Patient is a 80 y.o. female presenting with altered mental status and urinary tract infection.  Altered Mental Status Presenting symptoms: confusion   Urinary Tract Infection    Past Medical History  Diagnosis Date  . Diabetes mellitus without complication   . A-fib   . Renal disorder    Past Surgical History  Procedure Laterality Date  . Replacement total knee     No family history on file. History  Substance Use Topics  . Smoking status: Never Smoker   . Smokeless tobacco: Not on file  . Alcohol Use: No   OB History   Grav Para Term Preterm Abortions TAB SAB Ect Mult Living                 Review of Systems  Musculoskeletal: Positive for back pain.  Psychiatric/Behavioral: Positive for confusion.  All other systems reviewed and are negative.     Allergies  Septra  Home Medications   Prior to Admission medications   Medication Sig Start Date End Date Taking? Authorizing Provider  acetaminophen  (TYLENOL) 500 MG tablet Take 1,000 mg by mouth 3 (three) times daily as needed (pain).   Yes Historical Provider, MD  allopurinol (ZYLOPRIM) 100 MG tablet Take 100 mg by mouth 2 (two) times daily.    Yes Historical Provider, MD  aspirin EC 81 MG tablet Take 81 mg by mouth daily with breakfast.    Yes Historical Provider, MD  atorvastatin (LIPITOR) 80 MG tablet Take 80 mg by mouth daily after supper.    Yes Historical Provider, MD  carvedilol (COREG) 25 MG tablet Take 25 mg by mouth 2 (two) times daily with a meal.   Yes Historical Provider, MD  cephALEXin (KEFLEX) 500 MG capsule Take 1 capsule (500 mg total) by mouth 4 (four) times daily. 08/31/14  Yes Pilar Jarvis, MD  cholecalciferol (VITAMIN D) 1000 UNITS tablet Take 1,000 Units by mouth every other day.   Yes Historical Provider, MD  Cyanocobalamin (VITAMIN B-12 PO) Take 1 tablet by mouth every other day.   Yes Historical Provider, MD  ferrous sulfate 325 (65 FE) MG tablet Take 325 mg by mouth daily with breakfast.   Yes Historical Provider, MD  furosemide (LASIX) 20 MG tablet Take 20 mg by mouth 2 (two) times daily.    Yes Historical Provider, MD  lisinopril (PRINIVIL,ZESTRIL) 5 MG tablet Take 5 mg by mouth daily with breakfast.    Yes Historical Provider, MD  metFORMIN (GLUCOPHAGE) 500 MG tablet Take 500 mg by mouth daily with breakfast.    Yes  Historical Provider, MD  NIFEdipine (NIFEDICAL XL) 60 MG 24 hr tablet Take 60 mg by mouth daily after supper.    Yes Historical Provider, MD  Travoprost, BAK Free, (TRAVATAN) 0.004 % SOLN ophthalmic solution Place 1 drop into the right eye at bedtime.   Yes Historical Provider, MD  warfarin (COUMADIN) 5 MG tablet Take 5-10 mg by mouth daily after supper. Take 2 tablets (10 mg) on Monday, then take 1 tablet (5 mg) on Tuesday thru Sunday   Yes Historical Provider, MD   BP 112/92  Pulse 73  Temp(Src) 99.2 F (37.3 C) (Oral)  SpO2 96% Physical Exam  Constitutional: She appears well-developed and  well-nourished. No distress.  HENT:  Head: Normocephalic and atraumatic.  Right Ear: Hearing normal.  Left Ear: Hearing normal.  Nose: Nose normal.  Mouth/Throat: Oropharynx is clear and moist and mucous membranes are normal.  Eyes: Conjunctivae and EOM are normal. Pupils are equal, round, and reactive to light.  Neck: Normal range of motion. Neck supple.  Cardiovascular: Regular rhythm, S1 normal and S2 normal.  Exam reveals no gallop and no friction rub.   No murmur heard. Pulmonary/Chest: Effort normal and breath sounds normal. No respiratory distress. She exhibits no tenderness.  Abdominal: Soft. Normal appearance and bowel sounds are normal. There is no hepatosplenomegaly. There is no tenderness. There is no rebound, no guarding, no tenderness at McBurney's point and negative Murphy's sign. No hernia.  Musculoskeletal: Normal range of motion.  Neurological: She is alert. She has normal strength. No cranial nerve deficit or sensory deficit. Coordination normal. GCS eye subscore is 4. GCS verbal subscore is 5. GCS motor subscore is 6.  Patient is somnolent, does awaken to voice and follow commands  Skin: Skin is warm, dry and intact. No rash noted. No cyanosis.  Psychiatric: She has a normal mood and affect. Her speech is normal and behavior is normal. Thought content normal.    ED Course  Procedures (including critical care time) Labs Review Labs Reviewed  CBC WITH DIFFERENTIAL - Abnormal; Notable for the following:    RDW 17.0 (*)    Neutrophils Relative % 85 (*)    Neutro Abs 8.4 (*)    All other components within normal limits  COMPREHENSIVE METABOLIC PANEL - Abnormal; Notable for the following:    Glucose, Bld 129 (*)    GFR calc non Af Amer 48 (*)    GFR calc Af Amer 56 (*)    Anion gap 17 (*)    All other components within normal limits  URINALYSIS, ROUTINE W REFLEX MICROSCOPIC - Abnormal; Notable for the following:    Hgb urine dipstick SMALL (*)    Protein, ur >300  (*)    All other components within normal limits  TROPONIN I  URINE MICROSCOPIC-ADD ON  PROTIME-INR  I-STAT CG4 LACTIC ACID, ED    Imaging Review Dg Chest 2 View  08/31/2014   CLINICAL DATA:  Disoriented. History of atrial fibrillation. Initial encounter.  EXAM: CHEST  2 VIEW  COMPARISON:  None.  FINDINGS: Borderline enlarged cardiac silhouette and mediastinal contours with atherosclerotic plaque within the thoracic aorta. Post median sternotomy. Evaluation retrosternal clear space obscured secondary to overlying soft tissues. The lungs appear mildly hyperexpanded with mild diffuse slightly nodular thickening of the pulmonary interstitium. No focal airspace opacities. No pleural effusion or pneumothorax. No evidence of edema. No acute osseus abnormalities.  IMPRESSION: Borderline cardiomegaly and lung hyperexpansion without acute cardiopulmonary disease.   Electronically Signed   By:  Simonne ComeJohn  Watts M.D.   On: 08/31/2014 21:03   Ct Head Wo Contrast  08/31/2014   CLINICAL DATA:  Disoriented.  Initial encounter.  EXAM: CT HEAD WITHOUT CONTRAST  TECHNIQUE: Contiguous axial images were obtained from the base of the skull through the vertex without intravenous contrast.  COMPARISON:  None.  FINDINGS: There is mild, likely age-appropriate, centralized volume loss with mild ex vacuo dilatation of the ventricular system. Gray-white differentiation is maintained without CT evidence of acute large territory infarct. No intraparenchymal or extra-axial mass or hemorrhage. Intracranial atherosclerosis. Limited visualization of the paranasal sinuses and mastoid air cells is normal. No air-fluid levels. Note is made of mild hyperostosis frontalis. Post bilateral cataract surgery. Regional soft tissues appear normal. No displaced calvarial fracture.  IMPRESSION: Mild, likely age-appropriate, centralized volume loss without acute intracranial process.   Electronically Signed   By: Simonne ComeJohn  Watts M.D.   On: 08/31/2014 21:01      EKG Interpretation   Date/Time:  Monday September 01 2014 16:07:48 EDT Ventricular Rate:  79 PR Interval:  188 QRS Duration: 94 QT Interval:  414 QTC Calculation: 475 R Axis:   -34 Text Interpretation:  Sinus rhythm Left axis deviation Consider anterior  infarct No significant change since last tracing Confirmed by Merari Pion  MD,  Dov Dill [phone]) on 09/01/2014 4:30:46 PM      MDM   Final diagnoses:  None   acute encephalopathy  UTI  Patient presents to the ER for evaluation of progressive worsening of mental status changes. Patient was seen overnight last night for similar symptoms and diagnosed with urinary tract infection. She was treated with Rocephin and discharged on Keflex. Family reports that today, however, patient has not gotten out of bed. She has not been able to get a PE or drink anything. Has not been able to get her to take her medications. This is a significant change for her, she normally is very independent and can manage her medications and take care of herself independently.  Examination here reveals that the patient is somnolent. She does awaken to voice and answer questions, but is disoriented. This is not his norm. There are no focal findings, moving all 4 extremities and following commands with all 4 extremities. CT was performed yesterday and did not show any acute findings. Check chest x-ray performed yesterday as well. Repeat blood work and urinalysis today did not show any significant findings, urinalysis does appear improved since yesterday. Based on the patient's clinical decline, however, she will require hospitalization for further management.    Gilda Creasehristopher J. Ante Arredondo, MD 09/01/14 [id number]

## 2014-09-01 NOTE — ED Notes (Signed)
Pt's daughter states pt started having back pain on Friday and was [facility] for back pain and altered mental status and diagnosed with a UTI. Pt's daughter further states she was given IV rocephin yesterday and prescribed keflex, but they have been unable to feed her and get her to take the medication due to decreased LOC.

## 2014-09-01 NOTE — ED Notes (Signed)
Bed: WA15 Expected date:  Expected time:  Means of arrival:  Comments: EMS  

## 2014-09-02 ENCOUNTER — Encounter (HOSPITAL_COMMUNITY): Payer: Self-pay | Admitting: *Deleted

## 2014-09-02 DIAGNOSIS — E860 Dehydration: Secondary | ICD-10-CM | POA: Diagnosis present

## 2014-09-02 DIAGNOSIS — I472 Ventricular tachycardia: Secondary | ICD-10-CM | POA: Diagnosis not present

## 2014-09-02 DIAGNOSIS — I739 Peripheral vascular disease, unspecified: Secondary | ICD-10-CM | POA: Diagnosis present

## 2014-09-02 DIAGNOSIS — H409 Unspecified glaucoma: Secondary | ICD-10-CM | POA: Diagnosis present

## 2014-09-02 DIAGNOSIS — E1142 Type 2 diabetes mellitus with diabetic polyneuropathy: Secondary | ICD-10-CM | POA: Diagnosis present

## 2014-09-02 DIAGNOSIS — E669 Obesity, unspecified: Secondary | ICD-10-CM | POA: Diagnosis present

## 2014-09-02 DIAGNOSIS — E870 Hyperosmolality and hypernatremia: Secondary | ICD-10-CM | POA: Diagnosis not present

## 2014-09-02 DIAGNOSIS — I6529 Occlusion and stenosis of unspecified carotid artery: Secondary | ICD-10-CM | POA: Diagnosis present

## 2014-09-02 DIAGNOSIS — I2510 Atherosclerotic heart disease of native coronary artery without angina pectoris: Secondary | ICD-10-CM | POA: Diagnosis present

## 2014-09-02 DIAGNOSIS — I4891 Unspecified atrial fibrillation: Secondary | ICD-10-CM | POA: Diagnosis present

## 2014-09-02 DIAGNOSIS — Z951 Presence of aortocoronary bypass graft: Secondary | ICD-10-CM | POA: Diagnosis not present

## 2014-09-02 DIAGNOSIS — I615 Nontraumatic intracerebral hemorrhage, intraventricular: Secondary | ICD-10-CM | POA: Diagnosis present

## 2014-09-02 DIAGNOSIS — E876 Hypokalemia: Secondary | ICD-10-CM | POA: Diagnosis present

## 2014-09-02 DIAGNOSIS — J9811 Atelectasis: Secondary | ICD-10-CM | POA: Diagnosis present

## 2014-09-02 DIAGNOSIS — T45515A Adverse effect of anticoagulants, initial encounter: Secondary | ICD-10-CM | POA: Diagnosis present

## 2014-09-02 DIAGNOSIS — E871 Hypo-osmolality and hyponatremia: Secondary | ICD-10-CM | POA: Diagnosis not present

## 2014-09-02 DIAGNOSIS — E46 Unspecified protein-calorie malnutrition: Secondary | ICD-10-CM | POA: Diagnosis present

## 2014-09-02 DIAGNOSIS — I482 Chronic atrial fibrillation: Secondary | ICD-10-CM

## 2014-09-02 DIAGNOSIS — N170 Acute kidney failure with tubular necrosis: Secondary | ICD-10-CM | POA: Diagnosis present

## 2014-09-02 DIAGNOSIS — N390 Urinary tract infection, site not specified: Secondary | ICD-10-CM | POA: Diagnosis present

## 2014-09-02 DIAGNOSIS — I5033 Acute on chronic diastolic (congestive) heart failure: Secondary | ICD-10-CM | POA: Diagnosis present

## 2014-09-02 DIAGNOSIS — I10 Essential (primary) hypertension: Secondary | ICD-10-CM | POA: Diagnosis present

## 2014-09-02 DIAGNOSIS — Z7901 Long term (current) use of anticoagulants: Secondary | ICD-10-CM | POA: Diagnosis not present

## 2014-09-02 DIAGNOSIS — Z6829 Body mass index (BMI) 29.0-29.9, adult: Secondary | ICD-10-CM | POA: Diagnosis not present

## 2014-09-02 DIAGNOSIS — R410 Disorientation, unspecified: Secondary | ICD-10-CM | POA: Diagnosis present

## 2014-09-02 DIAGNOSIS — D649 Anemia, unspecified: Secondary | ICD-10-CM | POA: Diagnosis present

## 2014-09-02 DIAGNOSIS — N10 Acute tubulo-interstitial nephritis: Secondary | ICD-10-CM

## 2014-09-02 DIAGNOSIS — R1310 Dysphagia, unspecified: Secondary | ICD-10-CM | POA: Diagnosis present

## 2014-09-02 DIAGNOSIS — I129 Hypertensive chronic kidney disease with stage 1 through stage 4 chronic kidney disease, or unspecified chronic kidney disease: Secondary | ICD-10-CM | POA: Diagnosis present

## 2014-09-02 DIAGNOSIS — R400 Somnolence: Secondary | ICD-10-CM

## 2014-09-02 DIAGNOSIS — G919 Hydrocephalus, unspecified: Secondary | ICD-10-CM | POA: Diagnosis present

## 2014-09-02 DIAGNOSIS — N12 Tubulo-interstitial nephritis, not specified as acute or chronic: Secondary | ICD-10-CM | POA: Diagnosis present

## 2014-09-02 DIAGNOSIS — Z96651 Presence of right artificial knee joint: Secondary | ICD-10-CM | POA: Diagnosis present

## 2014-09-02 DIAGNOSIS — E785 Hyperlipidemia, unspecified: Secondary | ICD-10-CM | POA: Diagnosis present

## 2014-09-02 DIAGNOSIS — N189 Chronic kidney disease, unspecified: Secondary | ICD-10-CM | POA: Diagnosis present

## 2014-09-02 DIAGNOSIS — I454 Nonspecific intraventricular block: Secondary | ICD-10-CM | POA: Diagnosis not present

## 2014-09-02 LAB — GLUCOSE, CAPILLARY
GLUCOSE-CAPILLARY: 150 mg/dL — AB (ref 70–99)
Glucose-Capillary: 130 mg/dL — ABNORMAL HIGH (ref 70–99)
Glucose-Capillary: 140 mg/dL — ABNORMAL HIGH (ref 70–99)
Glucose-Capillary: 127 mg/dL — ABNORMAL HIGH (ref 70–99)
Glucose-Capillary: 135 mg/dL — ABNORMAL HIGH (ref 70–99)

## 2014-09-02 LAB — COMPREHENSIVE METABOLIC PANEL
ALBUMIN: 2.9 g/dL — AB (ref 3.5–5.2)
ALK PHOS: 71 U/L (ref 39–117)
ALT: 15 U/L (ref 0–35)
AST: 19 U/L (ref 0–37)
Anion gap: 16 — ABNORMAL HIGH (ref 5–15)
BUN: 24 mg/dL — ABNORMAL HIGH (ref 6–23)
CO2: 20 mEq/L (ref 19–32)
Calcium: 8.6 mg/dL (ref 8.4–10.5)
Chloride: 108 mEq/L (ref 96–112)
Creatinine, Ser: 1.08 mg/dL (ref 0.50–1.10)
GFR calc Af Amer: 53 mL/min — ABNORMAL LOW (ref >90)
GFR calc non Af Amer: 46 mL/min — ABNORMAL LOW (ref >90)
Glucose, Bld: 149 mg/dL — ABNORMAL HIGH (ref 70–99)
POTASSIUM: 3.8 meq/L (ref 3.7–5.3)
SODIUM: 144 meq/L (ref 137–147)
TOTAL PROTEIN: 6.8 g/dL (ref 6.0–8.3)
Total Bilirubin: 0.4 mg/dL (ref 0.3–1.2)

## 2014-09-02 LAB — CBC
HCT: 39.4 % (ref 36.0–46.0)
Hemoglobin: 12.5 g/dL (ref 12.0–15.0)
MCH: 29.1 pg (ref 26.0–34.0)
MCHC: 31.7 g/dL (ref 30.0–36.0)
MCV: 91.8 fL (ref 78.0–100.0)
PLATELETS: 237 10*3/uL (ref 150–400)
RBC: 4.29 MIL/uL (ref 3.87–5.11)
RDW: 17.1 % — AB (ref 11.5–15.5)
WBC: 11.1 10*3/uL — ABNORMAL HIGH (ref 4.0–10.5)

## 2014-09-02 LAB — HEMOGLOBIN A1C
HEMOGLOBIN A1C: 5.6 % (ref <5.7)
MEAN PLASMA GLUCOSE: 114 mg/dL (ref <117)

## 2014-09-02 LAB — PROTIME-INR
INR: 1.92 — AB (ref 0.00–1.49)
Prothrombin Time: 22.2 seconds — ABNORMAL HIGH (ref 11.6–15.2)

## 2014-09-02 LAB — LIPID PANEL
CHOLESTEROL: 204 mg/dL — AB (ref 0–200)
HDL: 64 mg/dL (ref >39)
LDL CALC: 110 mg/dL — AB (ref 0–99)
Total CHOL/HDL Ratio: 3.2 RATIO
Triglycerides: 149 mg/dL (ref <150)
VLDL: 30 mg/dL (ref 0–40)

## 2014-09-02 MED ORDER — CETYLPYRIDINIUM CHLORIDE 0.05 % MT LIQD
7.0000 mL | Freq: Two times a day (BID) | OROMUCOSAL | Status: DC
  Administered 2014-09-04 – 2014-09-30 (×45): 7 mL via OROMUCOSAL

## 2014-09-02 MED ORDER — CHLORHEXIDINE GLUCONATE 0.12 % MT SOLN
15.0000 mL | Freq: Two times a day (BID) | OROMUCOSAL | Status: DC
  Administered 2014-09-03 – 2014-09-30 (×50): 15 mL via OROMUCOSAL
  Filled 2014-09-02 (×53): qty 15

## 2014-09-02 MED ORDER — ONDANSETRON HCL 4 MG/2ML IJ SOLN
4.0000 mg | Freq: Three times a day (TID) | INTRAMUSCULAR | Status: DC | PRN
  Administered 2014-09-02 – 2014-09-09 (×2): 4 mg via INTRAVENOUS
  Filled 2014-09-02 (×2): qty 2

## 2014-09-02 MED ORDER — LATANOPROST 0.005 % OP SOLN
1.0000 [drp] | Freq: Every day | OPHTHALMIC | Status: DC
  Administered 2014-09-02 – 2014-09-29 (×29): 1 [drp] via OPHTHALMIC
  Filled 2014-09-02 (×4): qty 2.5

## 2014-09-02 MED ORDER — HYDRALAZINE HCL 20 MG/ML IJ SOLN: 10.0000 mg | INTRAMUSCULAR | Status: DC | PRN

## 2014-09-02 MED ORDER — AMIODARONE HCL 200 MG PO TABS
200.0000 mg | ORAL_TABLET | Freq: Every day | ORAL | Status: DC
  Administered 2014-09-05 – 2014-09-06 (×2): 200 mg via ORAL
  Filled 2014-09-02 (×5): qty 1

## 2014-09-02 MED ORDER — WARFARIN SODIUM 5 MG PO TABS
5.0000 mg | ORAL_TABLET | Freq: Once | ORAL | Status: DC
  Filled 2014-09-02: qty 1

## 2014-09-02 NOTE — Progress Notes (Addendum)
Progress Note   Susan SoGiuseppina Rotondo RUE:[id number] DOB: 12/27/1927 DOA: 09/01/2014 PCP: Pcp Not In System Dr. Saul FordyceSaito in [place] [phone]   Brief Narrative:   Susan Calhoun is an 80 y.o. female with PMH of diabetes managed with metformin, a true fibrillation on aspirin/Coumadin and rate control with nifedipine and Coreg, who was admitted 09/01/14 with altered mental status after being evaluated in the ED and sent home the day before with back pain for which she was found to have a UTI and discharged on Keflex.  The patient became increasingly confused prompting her family to bring her back for further evaluation.  She lives in [place] and is visiting her granddaughter locally.  Upon initial evaluation in the ED, the patient had an unremarkable CT of the head.  Her [place]CP in [place] was contacted and provided additional information about her history and medications.  Assessment/Plan:   Principal Problem:   Acute encephalopathy rule out CVA  Although UTI is a possible cause of her encephalopathy, the patient is at high risk for CVA with carotid artery disease, chronic atrial fibrillation, diabetes and hypertension. Her INR has been subtherapeutic. Her inability to follow commands makes her neuro exam difficult to interpret.  We'll obtain MRI. Add fasting lipid panel to ensure good lipid control.  PT/OT/ST evaluations ordered, but patient may not be able to participate fully at present.  No evidence of hyperammonemia or significant metabolic derangements to explain.  On empiric antibiotics for treatment of her UTI.  Note: Please send a copy of the patient's discharge summary home with the patient when she is discharged along with any radiographic reports so that she can bring this to her PCP in [place].  Active Problems:   Diabetes mellitus with diabetic polyneuropathy  Metformin on hold, currently on insulin sensitive SS I.  CBGs 94-150.  Hemoglobin A1c is 5.6%, indicating excellent  outpatient control.    A-fib  Currently maintaining sinus rhythm.  Resume amiodarone.  Continue Coumadin per pharmacy and nifedipine/Coreg for rate control.    Accelerated Hypertension  Blood pressure suboptimally controlled, but would not institute aggressive treatment until acute CVA has been ruled out.  If MRI rules out CVA, would work to lower bpm she may have hypertensive encephalopathy.    UTI (urinary tract infection)  On empiric Levaquin. Followup cultures.    DVT Prophylaxis  On Coumadin.  Code Status: Full. Family Communication: Granddaughter at bedside. Disposition Plan: Home when stable.   IV Access:    Peripheral IV   Procedures and diagnostic studies:   Dg Chest 2 View 08/31/2014: Borderline cardiomegaly and lung hyperexpansion without acute cardiopulmonary disease.     Ct Head Wo Contrast 08/31/2014: Mild, likely age-appropriate, centralized volume loss without acute intracranial process.     Medical Consultants:    None.   Other Consultants:    None.   Anti-Infectives:    Levaquin 09/01/14--->  Subjective:   Susan SoGiuseppina Huether is somnolent, responsive to stimulation but unable to follow commands.  Objective:    Filed Vitals:   09/01/14 1900 09/01/14 1905 09/01/14 2040 09/02/14 0450  BP: 189/112  127/76 167/67  Pulse:   81 86  Temp:  99.2 F (37.3 C) 98.3 F (36.8 C) 97.7 F (36.5 C)  TempSrc:   Axillary Oral  Resp: 24  22 20   Height:   4\' 11"  (1.499 m)   Weight:   63.5 kg (139 lb 15.9 oz)   SpO2:   98% 99%  Intake/Output Summary (Last 24 hours) at 09/02/14 1034 Last data filed at 09/02/14 0925  Gross per 24 hour  Intake    926 ml  Output    650 ml  Net    276 ml    Exam: Gen:  Somnolent Cardiovascular:  RRR, No M/R/G Respiratory:  Lungs diminished throughout Gastrointestinal:  Abdomen soft, NT/ND, + BS Extremities:  No C/E/C Neuro: Unable to follow commands. Left pupil with defect, likely from prior  cataract surgery. Right pupil responsive.   Data Reviewed:    Labs: Basic Metabolic Panel:  Recent Labs Lab 08/31/14 1807 09/01/14 1648 09/02/14 0530  NA 144 140 144  K 4.1 4.1 3.8  CL 108 103 108  CO2 23 20 20   GLUCOSE 99 129* 149*  BUN 24* 18 24*  CREATININE 1.37* 1.04 1.08  CALCIUM 9.1 9.4 8.6   GFR Estimated Creatinine Clearance: 31.4 ml/min (by C-G formula based on Cr of 1.08). Liver Function Tests:  Recent Labs Lab 08/31/14 1807 09/01/14 1648 09/02/14 0530  AST 18 22 19   ALT 16 17 15   ALKPHOS 73 81 71  BILITOT 0.3 0.5 0.4  PROT 7.5 8.1 6.8  ALBUMIN 3.5 3.5 2.9*    Recent Labs Lab 08/31/14 2107 09/01/14 2116  AMMONIA 21 20   Coagulation profile  Recent Labs Lab 08/31/14 1807 09/01/14 1648 09/02/14 0530  INR 1.84* 1.51* 1.92*    CBC:  Recent Labs Lab 08/31/14 1807 09/01/14 1648 09/02/14 0530  WBC 9.1 9.9 11.1*  NEUTROABS  --  8.4*  --   HGB 10.4* 12.9 12.5  HCT 32.6* 39.2 39.4  MCV 91.8 90.5 91.8  PLT 204 252 237   Cardiac Enzymes:  Recent Labs Lab 09/01/14 1648  TROPONINI <0.30   CBG:  Recent Labs Lab 08/31/14 1926 09/02/14 0008 09/02/14 0745  GLUCAP 94 150* 130*   Hgb A1c:  Recent Labs  09/01/14 1648  HGBA1C 5.6   Sepsis Labs:  Recent Labs Lab 08/31/14 1807 08/31/14 2119 09/01/14 1648 09/01/14 1657 09/02/14 0530  WBC 9.1  --  9.9  --  11.1*  LATICACIDVEN  --  0.70  --  0.74  --    Microbiology No results found for this or any previous visit (from the past 240 hour(s)).   Medications:   . allopurinol  100 mg Oral BID  . amiodarone  200 mg Oral Daily  . antiseptic oral rinse  7 mL Mouth Rinse q12n4p  . aspirin EC  81 mg Oral Q breakfast  . atorvastatin  80 mg Oral QPC supper  . carvedilol  25 mg Oral BID WC  . chlorhexidine  15 mL Mouth Rinse BID  . furosemide  20 mg Oral BID  . insulin aspart  0-9 Units Subcutaneous TID WC  . latanoprost  1 drop Right Eye QHS  . levofloxacin (LEVAQUIN) IV   750 mg Intravenous Q48H  . lisinopril  5 mg Oral Q breakfast  . NIFEdipine  60 mg Oral QPC supper  . warfarin  5 mg Oral ONCE-1800  . Warfarin - Pharmacist Dosing Inpatient   Does not apply q1800   Continuous Infusions: . sodium chloride 1,000 mL (09/01/14 2101)    Time spent: 35 minutes with > 50% of time discussing current diagnostic test results, clinical impression and plan of care with the patient's daughter at the bedside and coordinating care with PCP in [place].    LOS: 1 day   Kandis Henry  [facility] Pager [phone]. If unable  to reach me by pager, please call my cell phone at [phone].  *Please refer to [email or web address], password [email or web address] to get updated schedule on who will round on this patient, as hospitalists switch teams weekly. If 7PM-7AM, please contact night-coverage at [email or web address], password [email or web address] for any overnight needs.  09/02/2014, 10:34 AM

## 2014-09-02 NOTE — Care Management Note (Signed)
CARE MANAGEMENT NOTE 09/02/2014  Patient:  Susan Calhoun,Susan Calhoun   Account Number:  [id number]  Date Initiated:  09/02/2014  Documentation initiated by:  Sandford CrazeLEMENTS,Josue Kass  Subjective/Objective Assessment:   80 yo female admitted for Acute Encephalopathy.     Action/Plan:   Pt from [place] is here with daughter visiting granddaugter   Anticipated DC Date:  09/05/2014   Anticipated DC Plan:  HOME/SELF CARE      DC Planning Services  CM consult      Choice offered to / List presented to:             Status of service:  In process, will continue to follow Medicare Important Message given?   (If response is "NO", the following Medicare IM given date fields will be blank) Date Medicare IM given:   Medicare IM given by:   Date Additional Medicare IM given:   Additional Medicare IM given by:    Discharge Disposition:    Per UR Regulation:  Reviewed for med. necessity/level of care/duration of stay  If discussed at Long Length of Stay Meetings, dates discussed:    Comments:  09/02/14 Berenda MoraleNora Hefner RN,BSN,NCM Chart reviewed and CM following for DC needs.

## 2014-09-02 NOTE — Progress Notes (Signed)
UR completed 

## 2014-09-02 NOTE — Evaluation (Addendum)
Clinical/Bedside Swallow Evaluation Patient Details  Name: Susan Calhoun MRN: [id number] Date of Birth: 04/07/1928  Today's Date: 09/02/2014 Time: [id number] SLP Time Calculation (min): 17 min  Past Medical History:  Past Medical History  Diagnosis Date  . Diabetes mellitus with diabetic polyneuropathy   . A-fib   . Renal disorder   . Hypertension   . Anemia   . Glaucoma     right eye   . Carotid artery stenosis   . PVD (peripheral vascular disease)    Past Surgical History:  Past Surgical History  Procedure Laterality Date  . Replacement total knee Right   . Carotid endarterectomy  2015  . Coronary artery bypass graft    . Ablation      cardiac for arrthymia    HPI:  80 yo female [facility]- pt is from [place] and independent prior to admission. Pt presented to ED recently with confusion - concern is present for possible CVA per MD notes.  Per hospitalist note, UTI is a possible cause of her encephalopathy, but the patient is at high risk for CVA with carotid artery disease, chronic atrial fibrillation, diabetes and hypertension. Per MD note, pt's INR has been subtherapeutic. Neuro exam difficult to interpret due to decreased ability for pt to follow commands.  Head CT and chest xray negative at admission.    Assessment / Plan / Recommendation Clinical Impression  Anticipate pt's swallow function to be adequate when her mental status improves given lack of CN deficit (from eval able to be completed).    Pt did awaken to answer approximately 50% of questions and help to slide herself up when placed in Trendelenburg position.  She did require max verbal/tactile cues to stay alert and participate.    No indications of significant pharyngeal dysphagia with minimal intake observed.  Pt accepted only 1 tsp, one straw sip and 1/4 tsp amount of applesauce.  She demonstrated poor oral awareness of applesauce and did not orally transit or swallow- liquid swallow effective.     Recommend continue npo due to lethargy/AMS (except medicine with water when fully alert).  SLP to follow up for readiness for dietary advancement.  RN informed of results and suspicion for rapid improvement with swallow ability.  Please order speech/language evaluation if indicated when pt becomes alert.  Thanks.     Aspiration Risk  Severe    Diet Recommendation NPO;NPO except meds        Other  Recommendations Oral Care Recommendations: Oral care Q4 per protocol   Follow Up Recommendations   (tbd)    Frequency and Duration min 2x/week  2 weeks   Pertinent Vitals/Pain Low grade temperature      Swallow Study Prior Functional Status   per RN, pt was independent living in [place] prior to recent events leading up to this admission     General Date of Onset: 09/02/14 HPI: 80 [facility] female [facility]- pt is from [place] and independent prior to admission. Pt presented to ED recently with confusion - concern is present for possible CVA per MD notes.  Per hospitalist note, UTI is a possible cause of her encephalopathy, but the patient is at high risk for CVA with carotid artery disease, chronic atrial fibrillation, diabetes and hypertension. Per MD note, pt's INR has been subtherapeutic. Neuro exam difficult to interpret due to decreased ability for pt to follow commands.   Type of Study: Bedside swallow evaluation Diet Prior to this Study: NPO (  except medications with water) Temperature Spikes Noted:  (low grade fever) Respiratory Status: Room air History of Recent Intubation: No Behavior/Cognition: Doesn't follow directions;Decreased sustained attention;Confused;Lethargic;Distractible Oral Cavity - Dentition:  (no upper dentition, few lower dentition) Self-Feeding Abilities: Total assist Patient Positioning: Upright in bed Baseline Vocal Quality: Low vocal intensity Volitional Cough: Cognitively unable to elicit Volitional Swallow: Unable to elicit    Oral/Motor/Sensory Function  Overall Oral Motor/Sensory Function: Appears within functional limits for tasks assessed (from exam able to be completed due to decreased participation) Lingual Strength:  (tongue midline upon protrusion) Facial Symmetry:  (no focal droop) Velum: Within Functional Limits   Ice Chips Ice chips: Not tested   Thin Liquid Thin Liquid: Impaired Presentation: Spoon;Straw Oral Phase Impairments: Impaired anterior to posterior transit Oral Phase Functional Implications: Prolonged oral transit;Oral holding Other Comments: pt able to form suction on straw with second attempt    Nectar Thick Nectar Thick Liquid: Not tested   Honey Thick Honey Thick Liquid: Not tested   Puree Puree: Impaired Presentation: Spoon (pt accepted only 1/4 tsp) Oral Phase Impairments: Poor awareness of bolus;Reduced lingual movement/coordination;Impaired anterior to posterior transit Oral Phase Functional Implications: Oral holding Other Comments: pt did not swallow applesauce, use of thin via straw faciliated clearance   Solid   GO    Solid: Not tested Other Comments: due to mental status, aspiration risk       Donavan Burnetamara Markis Langland, MS [facility] SLP [phone]

## 2014-09-02 NOTE — Progress Notes (Addendum)
ANTICOAGULATION CONSULT NOTE - Follow Up Consult  Pharmacy Consult for Warfarin Indication: atrial fibrillation  Allergies  Allergen Reactions  . Septra [Sulfamethoxazole-Trimethoprim] Hives    Patient Measurements: Height: 4\' 11"  (149.9 cm) Weight: 139 lb 15.9 oz (63.5 kg) IBW/kg (Calculated) : 43.2  Vital Signs: Temp: 97.7 F (36.5 C) (10/13 0450) Temp Source: Oral (10/13 0450) BP: 167/67 mmHg (10/13 0450) Pulse Rate: 86 (10/13 0450)  Labs:  Recent Labs  08/31/14 1807 09/01/14 1648 09/02/14 0530  HGB 10.4* 12.9 12.5  HCT 32.6* 39.2 39.4  PLT 204 252 237  LABPROT 21.3* 18.3* 22.2*  INR 1.84* 1.51* 1.92*  CREATININE 1.37* 1.04 1.08  TROPONINI  --  <0.30  --     Estimated Creatinine Clearance: 31.4 ml/min (by C-G formula based on Cr of 1.08).   Medical History: Past Medical History  Diagnosis Date  . Diabetes mellitus without complication   . A-fib   . Renal disorder   . Hypertension   . Anemia   . Glaucoma     right eye     Medications:  Scheduled:  . allopurinol  100 mg Oral BID  . antiseptic oral rinse  7 mL Mouth Rinse q12n4p  . aspirin EC  81 mg Oral Q breakfast  . atorvastatin  80 mg Oral QPC supper  . carvedilol  25 mg Oral BID WC  . chlorhexidine  15 mL Mouth Rinse BID  . furosemide  20 mg Oral BID  . insulin aspart  0-9 Units Subcutaneous TID WC  . latanoprost  1 drop Right Eye QHS  . levofloxacin (LEVAQUIN) IV  750 mg Intravenous Q48H  . lisinopril  5 mg Oral Q breakfast  . NIFEdipine  60 mg Oral QPC supper  . Warfarin - Pharmacist Dosing Inpatient   Does not apply q1800   Infusions:  . sodium chloride 1,000 mL (09/01/14 2101)    Assessment: [age] yoF admitted on 10/12 with AMS, suspected UTI.  She is on chronic warfarin anticoagulation for atrial fibrillation.  She takes 5mg  daily except 10mg  mondays, with her last dose PTA taken on 10/12 (10mg ).  Pharmacy is consulted to continue warfarin dosing inpatient.  INR: slightly below goal  range of 2-3 at 1.92, though trending up (1.51 yesterday) CBC: stable, Hgb 12.5, Plt 237 Drug-drug interactions:  Levaquin may increase the INR. Amiodarone also started today which can increase the INR as well.  No bleeding issues noted.   Of note, given INR below goal range upon admission, an extra 2.5 mg of warfarin was ordered, though per the [facility], patient refused this dose.    Goal of Therapy:  INR 2-3 Monitor platelets by anticoagulation protocol: Yes   Plan:   Given the quick upward trend in INR, and the potential for interaction with Levaquin and amiodarone, will remain slightly more conservative with dosing, and continue with home regimen dose today (5 mg PO x 1) and adjust as necessary based on daily INR.  Obtain daily INR  Monitor for bleeding complications  Jamse ArnKatie Odile Veloso, PharmD Clinical Pharmacist PGY2 Hematology/Oncology Resident Pager: [phone] 09/02/2014 10:27 AM

## 2014-09-03 ENCOUNTER — Inpatient Hospital Stay (HOSPITAL_COMMUNITY): Payer: Medicare (Managed Care)

## 2014-09-03 ENCOUNTER — Encounter (HOSPITAL_COMMUNITY): Payer: Self-pay | Admitting: Neurological Surgery

## 2014-09-03 DIAGNOSIS — E1142 Type 2 diabetes mellitus with diabetic polyneuropathy: Secondary | ICD-10-CM

## 2014-09-03 DIAGNOSIS — G9340 Encephalopathy, unspecified: Secondary | ICD-10-CM

## 2014-09-03 DIAGNOSIS — N390 Urinary tract infection, site not specified: Secondary | ICD-10-CM

## 2014-09-03 DIAGNOSIS — G919 Hydrocephalus, unspecified: Secondary | ICD-10-CM

## 2014-09-03 DIAGNOSIS — I4891 Unspecified atrial fibrillation: Secondary | ICD-10-CM

## 2014-09-03 DIAGNOSIS — E1342 Other specified diabetes mellitus with diabetic polyneuropathy: Secondary | ICD-10-CM

## 2014-09-03 DIAGNOSIS — I10 Essential (primary) hypertension: Secondary | ICD-10-CM

## 2014-09-03 DIAGNOSIS — I480 Paroxysmal atrial fibrillation: Secondary | ICD-10-CM

## 2014-09-03 DIAGNOSIS — I615 Nontraumatic intracerebral hemorrhage, intraventricular: Principal | ICD-10-CM

## 2014-09-03 LAB — PROTIME-INR
INR: 2.1 — ABNORMAL HIGH (ref 0.00–1.49)
INR: 1.46 (ref 0.00–1.49)
Prothrombin Time: 23.7 seconds — ABNORMAL HIGH (ref 11.6–15.2)
Prothrombin Time: 17.9 seconds — ABNORMAL HIGH (ref 11.6–15.2)

## 2014-09-03 LAB — GLUCOSE, CAPILLARY
GLUCOSE-CAPILLARY: 142 mg/dL — AB (ref 70–99)
GLUCOSE-CAPILLARY: 125 mg/dL — AB (ref 70–99)
GLUCOSE-CAPILLARY: 113 mg/dL — AB (ref 70–99)
Glucose-Capillary: 167 mg/dL — ABNORMAL HIGH (ref 70–99)

## 2014-09-03 LAB — BLOOD GAS, ARTERIAL
Acid-base deficit: 3.8 mmol/L — ABNORMAL HIGH (ref 0.0–2.0)
Bicarbonate: 19.5 mEq/L — ABNORMAL LOW (ref 20.0–24.0)
Drawn by: 331001
FIO2: 0.21 %
O2 Saturation: 96.7 %
PCO2 ART: 27.3 mmHg — AB (ref 35.0–45.0)
PO2 ART: 90.8 mmHg (ref 80.0–100.0)
Patient temperature: 97.5
TCO2: 20.4 mmol/L (ref 0–100)
pH, Arterial: 7.465 — ABNORMAL HIGH (ref 7.350–7.450)

## 2014-09-03 LAB — URINE CULTURE
Colony Count: NO GROWTH
Culture: NO GROWTH

## 2014-09-03 LAB — ABO/RH: ABO/RH(D): O POS

## 2014-09-03 LAB — APTT: APTT: 30 s (ref 24–37)

## 2014-09-03 LAB — MRSA PCR SCREENING: MRSA BY PCR: NEGATIVE

## 2014-09-03 LAB — TYPE AND SCREEN
ABO/RH(D): O POS
Antibody Screen: NEGATIVE

## 2014-09-03 MED ORDER — HYDRALAZINE HCL 20 MG/ML IJ SOLN
10.0000 mg | INTRAMUSCULAR | Status: DC | PRN
  Administered 2014-09-03 – 2014-09-04 (×4): 10 mg via INTRAVENOUS
  Filled 2014-09-03 (×2): qty 1

## 2014-09-03 MED ORDER — PANTOPRAZOLE SODIUM 40 MG IV SOLR
40.0000 mg | INTRAVENOUS | Status: DC
  Administered 2014-09-03 – 2014-09-05 (×3): 40 mg via INTRAVENOUS
  Filled 2014-09-03 (×5): qty 40

## 2014-09-03 MED ORDER — FUROSEMIDE 10 MG/ML IJ SOLN
20.0000 mg | Freq: Every day | INTRAMUSCULAR | Status: DC
  Administered 2014-09-03: 20 mg via INTRAVENOUS
  Filled 2014-09-03: qty 2

## 2014-09-03 MED ORDER — DEXTROSE 5 % IV SOLN
430.0000 mg | Freq: Once | INTRAVENOUS | Status: AC
  Administered 2014-09-03: 430 mg via INTRAVENOUS
  Filled 2014-09-03: qty 8.6

## 2014-09-03 MED ORDER — SODIUM CHLORIDE 0.9 % IV BOLUS (SEPSIS)
250.0000 mL | Freq: Once | INTRAVENOUS | Status: AC
  Administered 2014-09-03: 250 mL via INTRAVENOUS

## 2014-09-03 MED ORDER — SODIUM CHLORIDE 0.9 % IV SOLN
Freq: Once | INTRAVENOUS | Status: AC
  Administered 2014-09-03: 17:00:00 via INTRAVENOUS

## 2014-09-03 MED ORDER — HYDRALAZINE HCL 20 MG/ML IJ SOLN
5.0000 mg | Freq: Four times a day (QID) | INTRAMUSCULAR | Status: DC
  Administered 2014-09-03 – 2014-09-04 (×4): 5 mg via INTRAVENOUS
  Administered 2014-09-04: 22:00:00 via INTRAVENOUS
  Administered 2014-09-04: 5 mg via INTRAVENOUS
  Filled 2014-09-03 (×2): qty 0.25
  Filled 2014-09-03 (×3): qty 1
  Filled 2014-09-03: qty 0.25
  Filled 2014-09-03: qty 1
  Filled 2014-09-03: qty 0.25
  Filled 2014-09-03: qty 1

## 2014-09-03 MED ORDER — DEXTROSE 5 % IV SOLN
2.0000 g | Freq: Once | INTRAVENOUS | Status: DC
  Filled 2014-09-03: qty 2

## 2014-09-03 MED ORDER — SODIUM CHLORIDE 0.9 % IV SOLN: Freq: Once | INTRAVENOUS | Status: DC

## 2014-09-03 MED ORDER — VITAMIN K1 10 MG/ML IJ SOLN
5.0000 mg | Freq: Once | INTRAVENOUS | Status: AC
  Administered 2014-09-03: 5 mg via INTRAVENOUS
  Filled 2014-09-03: qty 0.5

## 2014-09-03 MED ORDER — VANCOMYCIN HCL IN DEXTROSE 750-5 MG/150ML-% IV SOLN: 750.0000 mg | INTRAVENOUS | Status: DC

## 2014-09-03 MED ORDER — LORAZEPAM 2 MG/ML IJ SOLN
INTRAMUSCULAR | Status: AC
  Administered 2014-09-03: 2 mg
  Filled 2014-09-03: qty 1

## 2014-09-03 MED ORDER — DEXTROSE 5 % IV SOLN
430.0000 mg | Freq: Two times a day (BID) | INTRAVENOUS | Status: DC
  Administered 2014-09-03 – 2014-09-04 (×2): 430 mg via INTRAVENOUS
  Filled 2014-09-03 (×4): qty 8.6

## 2014-09-03 MED ORDER — DEXTROSE 5 % IV SOLN: 2.0000 g | Freq: Two times a day (BID) | INTRAVENOUS | Status: DC

## 2014-09-03 MED ORDER — VANCOMYCIN HCL 10 G IV SOLR
1500.0000 mg | Freq: Once | INTRAVENOUS | Status: DC
  Filled 2014-09-03: qty 1500

## 2014-09-03 MED ORDER — GADOBENATE DIMEGLUMINE 529 MG/ML IV SOLN
15.0000 mL | Freq: Once | INTRAVENOUS | Status: AC | PRN
  Administered 2014-09-03: 13 mL via INTRAVENOUS

## 2014-09-03 MED ORDER — LORAZEPAM 2 MG/ML IJ SOLN
INTRAMUSCULAR | Status: AC
  Filled 2014-09-03: qty 1

## 2014-09-03 MED ORDER — LORAZEPAM 2 MG/ML IJ SOLN
1.0000 mg | Freq: Once | INTRAMUSCULAR | Status: AC
  Administered 2014-09-03: 1 mg via INTRAVENOUS
  Filled 2014-09-03: qty 1

## 2014-09-03 MED ORDER — SODIUM CHLORIDE 0.9 % IV SOLN
Freq: Once | INTRAVENOUS | Status: AC
  Administered 2014-09-04: 01:00:00 via INTRAVENOUS

## 2014-09-03 MED ORDER — MIDAZOLAM HCL 2 MG/2ML IJ SOLN
INTRAMUSCULAR | Status: AC
  Administered 2014-09-04: 2 mg
  Filled 2014-09-03: qty 6

## 2014-09-03 MED ORDER — SODIUM CHLORIDE 0.9 % IV SOLN
1.0000 g | Freq: Four times a day (QID) | INTRAVENOUS | Status: DC
  Filled 2014-09-03: qty 1000

## 2014-09-03 MED ORDER — MORPHINE SULFATE 2 MG/ML IJ SOLN
INTRAMUSCULAR | Status: AC
  Administered 2014-09-04: 2 mg via INTRAVENOUS
  Filled 2014-09-03: qty 1

## 2014-09-03 MED ORDER — METOPROLOL TARTRATE 1 MG/ML IV SOLN
2.5000 mg | Freq: Four times a day (QID) | INTRAVENOUS | Status: DC
  Administered 2014-09-03 – 2014-09-04 (×3): 2.5 mg via INTRAVENOUS
  Administered 2014-09-04: 5 mg via INTRAVENOUS
  Administered 2014-09-04 (×3): 2.5 mg via INTRAVENOUS
  Filled 2014-09-03 (×11): qty 5

## 2014-09-03 MED ORDER — INSULIN ASPART 100 UNIT/ML ~~LOC~~ SOLN
0.0000 [IU] | SUBCUTANEOUS | Status: DC
  Administered 2014-09-03: 2 [IU] via SUBCUTANEOUS
  Administered 2014-09-04 – 2014-09-05 (×3): 1 [IU] via SUBCUTANEOUS
  Administered 2014-09-05: 2 [IU] via SUBCUTANEOUS
  Administered 2014-09-05: 1 [IU] via SUBCUTANEOUS
  Administered 2014-09-05: 2 [IU] via SUBCUTANEOUS
  Administered 2014-09-05: 1 [IU] via SUBCUTANEOUS
  Administered 2014-09-05 – 2014-09-06 (×2): 2 [IU] via SUBCUTANEOUS
  Administered 2014-09-06: 1 [IU] via SUBCUTANEOUS

## 2014-09-03 NOTE — Progress Notes (Signed)
OT Cancellation Note  Patient Details Name: Susan Calhoun MRN: [id number] DOB: 07/09/1928   Cancelled Treatment:    Reason Eval/Treat Not Completed: Other (comment) Pt being moved to negative pressure room and is going for MRI.  Will recheck on pt later in day or tomorrow Thanks, Dorena BodoLori   Rilyn Upshaw D 09/03/2014, 10:32 AM

## 2014-09-03 NOTE — Consult Note (Signed)
Reason for Consult:HCP Referring Physician: hospitalists  Susan Calhoun is an 80 y.o. female.   HPI:  80 yo wf admitted 3 days ago with MS changes. Initial CT showed some ventriculomegaly. Was being treated for UTI. MS changes worsened  and an MRI was performed with the findings as below. Pt is lethargic and cannot cooperate with H&P. I can take no history. She was on coumadin for afib. Latest INR 2.1. I was called after MRI. ID is seeing to r/o meningitis. She was transferred to ICU.  Past Medical History  Diagnosis Date  . Diabetes mellitus with diabetic polyneuropathy   . A-fib   . Renal disorder   . Hypertension   . Anemia   . Glaucoma     right eye   . Carotid artery stenosis   . PVD (peripheral vascular disease)     Past Surgical History  Procedure Laterality Date  . Replacement total knee Right   . Carotid endarterectomy  2015  . Coronary artery bypass graft    . Ablation      cardiac for arrthymia     Allergies  Allergen Reactions  . Septra [Sulfamethoxazole-Trimethoprim] Hives    History  Substance Use Topics  . Smoking status: Never Smoker   . Smokeless tobacco: Not on file  . Alcohol Use: No    History reviewed. No pertinent family history.   Review of Systems  Positive ROS: unable to obtain  All other systems have been reviewed and were otherwise negative with the exception of those mentioned in the HPI and as above.  Objective: Vital signs in last 24 hours: Temp:  [97.3 F (36.3 C)-99 F (37.2 C)] 99 F (37.2 C) (10/14 1908) Pulse Rate:  [86-98] 94 (10/14 1908) Resp:  [16-25] 23 (10/14 1908) BP: (142-204)/(64-107) 204/70 mmHg (10/14 2039) SpO2:  [96 %-100 %] 96 % (10/14 1900)  General Appearance: somnolent, appears stated age Head: Normocephalic, without obvious abnormality, atraumatic Eyes: PERRL, conjunctiva/corneas clear      Neck: Supple, symmetrical, trachea midline Lungs: respirations unlabored  NEUROLOGIC:   Mental status:  somnolent, unable to test speech, attention span, Memory or fund of knowledge Motor Exam - localizes B Sensory Exam - unable to test Reflexes: symmetric, no pathologic reflexes, No Hoffman's, No clonus Coordination - unable to test Gait - unable to test Balance - unable to test Cranial Nerves: I: smell Not tested  II: visual acuity  OS: na   OD: na  II: visual fields Unable to test  II: pupils Equal, round, reactive to light  III,VII: ptosis   III,IV,VI: extraocular muscles  Gaze conjugate  V: mastication   V: facial light touch sensation    V,VII: corneal reflex  Present  VII: facial muscle function - upper    VII: facial muscle function - lower   VIII: hearing   IX: soft palate elevation    IX,X: gag reflex   XI: trapezius strength    XI: sternocleidomastoid strength   XI: neck flexion strength    XII: tongue strength      Data Review Lab Results  Component Value Date   WBC 11.1* 09/02/2014   HGB 12.5 09/02/2014   HCT 39.4 09/02/2014   MCV 91.8 09/02/2014   PLT 237 09/02/2014   Lab Results  Component Value Date   NA 144 09/02/2014   K 3.8 09/02/2014   CL 108 09/02/2014   CO2 20 09/02/2014   BUN 24* 09/02/2014   CREATININE 1.08 09/02/2014  GLUCOSE 149* 09/02/2014   Lab Results  Component Value Date   INR 2.10* 09/03/2014    Radiology: No results found.   Assessment/Plan: 80 yo female with acute HCP of unclear etiology and potentially some IVH whose anticoagulation is being reversed with FFP. Once coags are corrected I would rec a ventric and CSF sampling. I have spoken to dr. Venetia MaxonStern who takes over at 7 pm.   Tia AlertJONES,Seth Friedlander S 09/03/2014 8:49 PM

## 2014-09-03 NOTE — Progress Notes (Signed)
Report called to Victorino DikeJennifer, RN on [facility].

## 2014-09-03 NOTE — Progress Notes (Signed)
SLP Cancellation Note  Patient Details Name: Susan Calhoun MRN: [id number] DOB: 08/10/1928   Cancelled treatment:       Reason Eval/Treat Not Completed:  Per RN, pt is not more alert today than previous date. For MRI at approx 11 am today.  Will attempt to return this pm for reevaluation of swallow ability.     Donavan Burnetamara Brandt Chaney, MS [facility] SLP [phone]

## 2014-09-03 NOTE — Consult Note (Addendum)
[facility]    Date of Admission:  09/01/2014  Date of Consult:  09/03/2014  Reason for Consult: possible meningitis in pt with hydrocephalus Referring Physician: Dr. Charlies Silvers  HPI: Susan Calhoun is an 80 y.o. female known Atrial fibrillation on coumadin, HTN, PVD who had been in typical state of health which was per daughter very high functioning until this past weekend when she became more confused as noticed by her daughter. She then lost balance and fell vs dresser but did not hit her head. She developed progressive confusion as well as pain radiating down her back and right side. She had been seen in the ED and thought to possibly have UTI with confusion due to this. UA with 7-10 wbc on 08/31/14 but no culture done. l She was given rocephin 32m in ED. She was dc w script for keflex which she did not yet fill and she was brought back with worsening confusion and was admitted on 10/12 with worsening confusion. She was admitted and placed on levaquin for possible UTI on the 12th but cultures were negative from urine and UA was negative. CT from the 11th read as atrophy.   On admission she was somnolent but hypertensive, with temp max of 99.2.  She ultimately had an MRI brain which showed :   "Acute hydrocephalus, worsened since 3 days ago (where it was present with retrospect) . Layering material in  the occipital horns of lateral ventricles, at least partly  Hemorrhagic."  There was a question of ?  focal thrombus in the atrium of the right  lateral ventricle, possibly associated with the choroid. Dr. SMaree Eriementioned that meningitis was also in differential for this presentation.  Pt also has a crop of few vesicles on her buttocks that team was concerned could be VZV. She has been started on broad spectrum antibiotics for meningoencephalitis with rocephin, AMP, Vancomycin along with acyclovir.   Past Medical History  Diagnosis Date  . Diabetes  mellitus with diabetic polyneuropathy   . A-fib   . Renal disorder   . Hypertension   . Anemia   . Glaucoma     right eye   . Carotid artery stenosis   . PVD (peripheral vascular disease)     Past Surgical History  Procedure Laterality Date  . Replacement total knee Right   . Carotid endarterectomy  2015  . Coronary artery bypass graft    . Ablation      cardiac for arrthymia   ergies:   Allergies  Allergen Reactions  . Septra [Sulfamethoxazole-Trimethoprim] Hives     Medications: I have reviewed patients current medications as documented in Epic Anti-infectives   Start     Dose/Rate Route Frequency Ordered Stop   09/04/14 1600  vancomycin (VANCOCIN) IVPB 750 mg/150 ml premix  Status:  Discontinued     750 mg 150 mL/hr over 60 Minutes Intravenous Every 24 hours 09/03/14 1518 09/03/14 1537   09/04/14 0200  cefTRIAXone (ROCEPHIN) 2 g in dextrose 5 % 50 mL IVPB  Status:  Discontinued     2 g 100 mL/hr over 30 Minutes Intravenous Every 12 hours 09/03/14 1431 09/03/14 1537   09/03/14 2200  acyclovir (ZOVIRAX) 430 mg in dextrose 5 % 100 mL IVPB     430 mg 108.6 mL/hr over 60 Minutes Intravenous Every 12 hours 09/03/14 1011     09/03/14 1800  ampicillin (OMNIPEN) 1 g in sodium chloride 0.9 % 50 mL IVPB  Status:  Discontinued     1 g 150 mL/hr over 20 Minutes Intravenous 4 times per day 09/03/14 1519 09/03/14 1537   09/03/14 1600  vancomycin (VANCOCIN) 1,500 mg in sodium chloride 0.9 % 500 mL IVPB  Status:  Discontinued     1,500 mg 250 mL/hr over 120 Minutes Intravenous  Once 09/03/14 1517 09/03/14 1537   09/03/14 1515  cefTRIAXone (ROCEPHIN) 2 g in dextrose 5 % 50 mL IVPB  Status:  Discontinued     2 g 100 mL/hr over 30 Minutes Intravenous  Once 09/03/14 1430 09/03/14 1537   09/03/14 1100  acyclovir (ZOVIRAX) 430 mg in dextrose 5 % 100 mL IVPB     430 mg 108.6 mL/hr over 60 Minutes Intravenous  Once 09/03/14 1010 09/03/14 1342   09/01/14 2200  levofloxacin (LEVAQUIN) IVPB  750 mg  Status:  Discontinued     750 mg 100 mL/hr over 90 Minutes Intravenous Every 48 hours 09/01/14 2039 09/03/14 1422      Social History:  reports that she has never smoked. She does not have any smokeless tobacco history on file. She reports that she does not drink alcohol or use illicit drugs.  History reviewed. No pertinent family history.  As in HPI and primary teams notes otherwise 12 point review of systems is negative  Blood pressure 173/80, pulse 95, temperature 98.5 F (36.9 C), temperature source Oral, resp. rate 18, height 4' 11"  (1.499 m), weight 139 lb 15.9 oz (63.5 kg), SpO2 100.00%.   General: obtunded HEENT:  No focal lesions,  CVS tachycardic  rate, normal r,  no murmur rubs or gallops Chest: clear to auscultation bilaterally, no wheezing, rales or rhonchi Abdomen: softnondistended, normal bowel sounds, Extremities: no  clubbing or edema noted bilaterally Skin:  She has a crop of vesicles on buttocks, certainly NOT taking up the entire dermatome      Neuro:  Obtunded but moving all extremities   Results for orders placed during the hospital encounter of 09/01/14 (from the past 48 hour(s))  URINALYSIS, ROUTINE W REFLEX MICROSCOPIC     Status: Abnormal   Collection Time    09/01/14  4:27 PM      Result Value Ref Range   Color, Urine YELLOW  YELLOW   APPearance CLEAR  CLEAR   Specific Gravity, Urine 1.018  1.005 - 1.030   pH 6.0  5.0 - 8.0   Glucose, UA NEGATIVE  NEGATIVE mg/dL   Hgb urine dipstick SMALL (*) NEGATIVE   Bilirubin Urine NEGATIVE  NEGATIVE   Ketones, ur NEGATIVE  NEGATIVE mg/dL   Protein, ur >300 (*) NEGATIVE mg/dL   Urobilinogen, UA 0.2  0.0 - 1.0 mg/dL   Nitrite NEGATIVE  NEGATIVE   Leukocytes, UA NEGATIVE  NEGATIVE  URINE MICROSCOPIC-ADD ON     Status: None   Collection Time    09/01/14  4:27 PM      Result Value Ref Range   Squamous Epithelial / LPF RARE  RARE   WBC, UA 0-2  <3 WBC/hpf   RBC / HPF 0-2  <3 RBC/hpf  CBC WITH  DIFFERENTIAL     Status: Abnormal   Collection Time    09/01/14  4:48 PM      Result Value Ref Range   WBC 9.9  4.0 - 10.5 K/uL   RBC 4.33  3.87 - 5.11 MIL/uL   Hemoglobin 12.9  12.0 - 15.0 g/dL   HCT 39.2  36.0 - 46.0 %   MCV 90.5  78.0 - 100.0 fL   MCH 29.8  26.0 - 34.0 pg   MCHC 32.9  30.0 - 36.0 g/dL   RDW 17.0 (*) 11.5 - 15.5 %   Platelets 252  150 - 400 K/uL   Neutrophils Relative % 85 (*) 43 - 77 %   Neutro Abs 8.4 (*) 1.7 - 7.7 K/uL   Lymphocytes Relative 12  12 - 46 %   Lymphs Abs 1.2  0.7 - 4.0 K/uL   Monocytes Relative 3  3 - 12 %   Monocytes Absolute 0.3  0.1 - 1.0 K/uL   Eosinophils Relative 0  0 - 5 %   Eosinophils Absolute 0.0  0.0 - 0.7 K/uL   Basophils Relative 0  0 - 1 %   Basophils Absolute 0.0  0.0 - 0.1 K/uL  COMPREHENSIVE METABOLIC PANEL     Status: Abnormal   Collection Time    09/01/14  4:48 PM      Result Value Ref Range   Sodium 140  137 - 147 mEq/L   Potassium 4.1  3.7 - 5.3 mEq/L   Chloride 103  96 - 112 mEq/L   CO2 20  19 - 32 mEq/L   Glucose, Bld 129 (*) 70 - 99 mg/dL   BUN 18  6 - 23 mg/dL   Creatinine, Ser 1.04  0.50 - 1.10 mg/dL   Calcium 9.4  8.4 - 10.5 mg/dL   Total Protein 8.1  6.0 - 8.3 g/dL   Albumin 3.5  3.5 - 5.2 g/dL   AST 22  0 - 37 U/L   Comment: SLIGHT HEMOLYSIS     HEMOLYSIS AT THIS LEVEL MAY AFFECT RESULT   ALT 17  0 - 35 U/L   Alkaline Phosphatase 81  39 - 117 U/L   Total Bilirubin 0.5  0.3 - 1.2 mg/dL   GFR calc non Af Amer 48 (*) >90 mL/min   GFR calc Af Amer 56 (*) >90 mL/min   Comment: (NOTE)     The eGFR has been calculated using the CKD EPI equation.     This calculation has not been validated in all clinical situations.     eGFR's persistently <90 mL/min signify possible Chronic Kidney     Disease.   Anion gap 17 (*) 5 - 15  TROPONIN I     Status: None   Collection Time    09/01/14  4:48 PM      Result Value Ref Range   Troponin I <0.30  <0.30 ng/mL   Comment:            Due to the release kinetics of cTnI,      a negative result within the first hours     of the onset of symptoms does not rule out     myocardial infarction with certainty.     If myocardial infarction is still suspected,     repeat the test at appropriate intervals.  PROTIME-INR     Status: Abnormal   Collection Time    09/01/14  4:48 PM      Result Value Ref Range   Prothrombin Time 18.3 (*) 11.6 - 15.2 seconds   INR 1.51 (*) 0.00 - 1.49  HEMOGLOBIN A1C     Status: None   Collection Time    09/01/14  4:48 PM      Result Value Ref Range   Hemoglobin A1C 5.6  <5.7 %   Comment: (NOTE)  According to the ADA Clinical Practice Recommendations for 2011, when     HbA1c is used as a screening test:      >=6.5%   Diagnostic of Diabetes Mellitus               (if abnormal result is confirmed)     5.7-6.4%   Increased risk of developing Diabetes Mellitus     References:Diagnosis and Classification of Diabetes Mellitus,Diabetes     OYDX,4128,78(MVEHM 1):S62-S69 and Standards of Medical Care in             Diabetes - 2011,Diabetes CNOB,0962,83 (Suppl 1):S11-S61.   Mean Plasma Glucose 114  <117 mg/dL   Comment: Performed at [facility], ED     Status: None   Collection Time    09/01/14  4:57 PM      Result Value Ref Range   Lactic Acid, Venous 0.74  0.5 - 2.2 mmol/L  URINE CULTURE     Status: None   Collection Time    09/01/14  8:52 PM      Result Value Ref Range   Specimen Description URINE, RANDOM     Special Requests NONE     Culture  Setup Time       Value: 09/02/2014 05:33     Performed at [place] [facility]       Value: NO GROWTH     Performed at [facility]   Culture       Value: NO GROWTH     Performed at [facility]   Report Status 09/03/2014 FINAL    AMMONIA     Status: None   Collection Time    09/01/14  9:16 PM      Result Value Ref Range   Ammonia 20  11 - 60  umol/L  CULTURE, BLOOD (ROUTINE X 2)     Status: None   Collection Time    09/01/14  9:18 PM      Result Value Ref Range   Specimen Description BLOOD RIGHT ARM     Special Requests BOTTLES DRAWN AEROBIC AND ANAEROBIC 4CC     Culture  Setup Time       Value: 09/02/2014 04:13     Performed at [facility]   Culture       Value:        BLOOD CULTURE RECEIVED NO GROWTH TO DATE CULTURE WILL BE HELD FOR 5 DAYS BEFORE ISSUING A FINAL NEGATIVE REPORT     Performed at [facility]   Report Status PENDING    CULTURE, BLOOD (ROUTINE X 2)     Status: None   Collection Time    09/01/14  9:45 PM      Result Value Ref Range   Specimen Description BLOOD RIGHT HAND     Special Requests BOTTLES DRAWN AEROBIC ONLY 8CC     Culture  Setup Time       Value: 09/02/2014 04:12     Performed at [facility]   Culture       Value:        BLOOD CULTURE RECEIVED NO GROWTH TO DATE CULTURE WILL BE HELD FOR 5 DAYS BEFORE ISSUING A FINAL NEGATIVE REPORT     Performed at [facility]   Report Status PENDING    GLUCOSE, CAPILLARY     Status: Abnormal   Collection Time    09/02/14 12:08 AM  Result Value Ref Range   Glucose-Capillary 150 (*) 70 - 99 mg/dL   Comment 1 Notify RN    CBC     Status: Abnormal   Collection Time    09/02/14  5:30 AM      Result Value Ref Range   WBC 11.1 (*) 4.0 - 10.5 K/uL   RBC 4.29  3.87 - 5.11 MIL/uL   Hemoglobin 12.5  12.0 - 15.0 g/dL   HCT 39.4  36.0 - 46.0 %   MCV 91.8  78.0 - 100.0 fL   MCH 29.1  26.0 - 34.0 pg   MCHC 31.7  30.0 - 36.0 g/dL   RDW 17.1 (*) 11.5 - 15.5 %   Platelets 237  150 - 400 K/uL  COMPREHENSIVE METABOLIC PANEL     Status: Abnormal   Collection Time    09/02/14  5:30 AM      Result Value Ref Range   Sodium 144  137 - 147 mEq/L   Potassium 3.8  3.7 - 5.3 mEq/L   Chloride 108  96 - 112 mEq/L   CO2 20  19 - 32 mEq/L   Glucose, Bld 149 (*) 70 - 99 mg/dL   BUN 24 (*) 6 - 23 mg/dL   Creatinine, Ser 1.08  0.50 -  1.10 mg/dL   Calcium 8.6  8.4 - 10.5 mg/dL   Total Protein 6.8  6.0 - 8.3 g/dL   Albumin 2.9 (*) 3.5 - 5.2 g/dL   AST 19  0 - 37 U/L   ALT 15  0 - 35 U/L   Alkaline Phosphatase 71  39 - 117 U/L   Total Bilirubin 0.4  0.3 - 1.2 mg/dL   GFR calc non Af Amer 46 (*) >90 mL/min   GFR calc Af Amer 53 (*) >90 mL/min   Comment: (NOTE)     The eGFR has been calculated using the CKD EPI equation.     This calculation has not been validated in all clinical situations.     eGFR's persistently <90 mL/min signify possible Chronic Kidney     Disease.   Anion gap 16 (*) 5 - 15  PROTIME-INR     Status: Abnormal   Collection Time    09/02/14  5:30 AM      Result Value Ref Range   Prothrombin Time 22.2 (*) 11.6 - 15.2 seconds   INR 1.92 (*) 0.00 - 1.49  LIPID PANEL     Status: Abnormal   Collection Time    09/02/14  5:30 AM      Result Value Ref Range   Cholesterol 204 (*) 0 - 200 mg/dL   Triglycerides 149  <150 mg/dL   HDL 64  >39 mg/dL   Total CHOL/HDL Ratio 3.2     VLDL 30  0 - 40 mg/dL   LDL Cholesterol 110 (*) 0 - 99 mg/dL   Comment:            Total Cholesterol/HDL:CHD Risk     Coronary Heart Disease Risk Table                         Men   Women      1/2 Average Risk   3.4   3.3      Average Risk       5.0   4.4      2 X Average Risk   9.6   7.1  3 X Average Risk  23.4   11.0                Use the calculated Patient Ratio     above and the CHD Risk Table     to determine the patient's CHD Risk.                ATP III CLASSIFICATION (LDL):      <100     mg/dL   Optimal      100-129  mg/dL   Near or Above                        Optimal      130-159  mg/dL   Borderline      160-189  mg/dL   High      >190     mg/dL   Very High     Performed at [place], CAPILLARY     Status: Abnormal   Collection Time    09/02/14  7:45 AM      Result Value Ref Range   Glucose-Capillary 130 (*) 70 - 99 mg/dL   Comment 1 Documented in Chart     Comment 2 Notify RN      GLUCOSE, CAPILLARY     Status: Abnormal   Collection Time    09/02/14 11:52 AM      Result Value Ref Range   Glucose-Capillary 140 (*) 70 - 99 mg/dL  GLUCOSE, CAPILLARY     Status: Abnormal   Collection Time    09/02/14  4:43 PM      Result Value Ref Range   Glucose-Capillary 127 (*) 70 - 99 mg/dL  GLUCOSE, CAPILLARY     Status: Abnormal   Collection Time    09/02/14  9:08 PM      Result Value Ref Range   Glucose-Capillary 135 (*) 70 - 99 mg/dL   Comment 1 Notify RN    PROTIME-INR     Status: Abnormal   Collection Time    09/03/14  4:46 AM      Result Value Ref Range   Prothrombin Time 23.7 (*) 11.6 - 15.2 seconds   INR 2.10 (*) 0.00 - 1.49  GLUCOSE, CAPILLARY     Status: Abnormal   Collection Time    09/03/14  7:58 AM      Result Value Ref Range   Glucose-Capillary 142 (*) 70 - 99 mg/dL   Comment 1 Documented in Chart     Comment 2 Notify RN    GLUCOSE, CAPILLARY     Status: Abnormal   Collection Time    09/03/14 12:55 PM      Result Value Ref Range   Glucose-Capillary 125 (*) 70 - 99 mg/dL   Comment 1 Documented in Chart     Comment 2 Notify RN     @BRIEFLABTABLE (sdes,specrequest,cult,reptstatus)   ) Recent Results (from the past 720 hour(s))  URINE CULTURE     Status: None   Collection Time    09/01/14  8:52 PM      Result Value Ref Range Status   Specimen Description URINE, RANDOM   Final   Special Requests NONE   Final   Culture  Setup Time     Final   Value: 09/02/2014 05:33     Performed at [place]     Final   Value: NO GROWTH  Performed at [facility]     Final   Value: NO GROWTH     Performed at [facility]   Report Status 09/03/2014 FINAL   Final  CULTURE, BLOOD (ROUTINE X 2)     Status: None   Collection Time    09/01/14  9:18 PM      Result Value Ref Range Status   Specimen Description BLOOD RIGHT ARM   Final   Special Requests BOTTLES DRAWN AEROBIC AND ANAEROBIC 4CC   Final    Culture  Setup Time     Final   Value: 09/02/2014 04:13     Performed at [facility]   Culture     Final   Value:        BLOOD CULTURE RECEIVED NO GROWTH TO DATE CULTURE WILL BE HELD FOR 5 DAYS BEFORE ISSUING A FINAL NEGATIVE REPORT     Performed at [facility]   Report Status PENDING   Incomplete  CULTURE, BLOOD (ROUTINE X 2)     Status: None   Collection Time    09/01/14  9:45 PM      Result Value Ref Range Status   Specimen Description BLOOD RIGHT HAND   Final   Special Requests BOTTLES DRAWN AEROBIC ONLY 8CC   Final   Culture  Setup Time     Final   Value: 09/02/2014 04:12     Performed at [facility]   Culture     Final   Value:        BLOOD CULTURE RECEIVED NO GROWTH TO DATE CULTURE WILL BE HELD FOR 5 DAYS BEFORE ISSUING A FINAL NEGATIVE REPORT     Performed at [facility]   Report Status PENDING   Incomplete     Impression/Recommendation  Principal Problem:   Acute encephalopathy Active Problems:   Diabetes mellitus with diabetic polyneuropathy   A-fib   Hypertension   UTI (urinary tract infection)   Puneet Selden is a 80 y.o. female with atrial fibrillation on coumadin, with onset of confusion, obtundation now found to have hydrocephalus with likely hemorrhagic component  #1 Hydrocephalus with hemorrhagic component. I discussed this case with Dr. Maree Erie with re to differential for meningitis. He listed that as his less likely dx. Additionally upon review with him there is NO leptomeningeal enhancement whatsoever which I would certainly expect with TRUE bacterial meningoencephalitis. I also dont think her clinical hx is c/w this.  Certainly she DOES NOT have HSV 1 meningoencephalitis with pristine temporal lobes on MRI 4-5 days into her illness  HSV 2 does not cause this type of picture  VZV also does NOT look c/w with picture on MRI  I am stopping her antibiotics (with exception of acylovir) see below  I think key to her  care will be directed by Neurosurgery and I expect that they will likely drain of CSF as part of therapy  This can certainly be sent for CSF cell count, differential protein, glucose and CSF cultures  #2 ? Possible shingles: lesions on back are more likely HSV 2 eruption rather than VZV though both are possible  --I would recommend having one of these unroofed and sent for HSV by PCR and VZV by PCR --IP tells me their policy is airborne precautions. This seems overkill to me but is policy unless we can exclude VZV --ok to continue acyclovir but I am certain she DOES NOT have CNS HSV1, or VZV infection  of CNS   #3 Will screen for HIV and hepatitis  Dr. Linus Salmons will be doing consults at [facility] tomorrow and can follow her up there  Tomorrow.   09/03/2014, 3:37 PM   Thank you so much for this interesting consult  [place] [facility] [place] [phone] (pager) [phone] (office) 09/03/2014, 3:37 PM  Susan Calhoun 09/03/2014, 3:37 PM

## 2014-09-03 NOTE — Consult Note (Signed)
PULMONARY / CRITICAL CARE MEDICINE - consult   Name: Susan Calhoun MRN: [id number] DOB: [birth year]    ADMISSION DATE:  09/01/2014 CONSULTATION DATE:  10/14   REFERRING MD :  Elisabeth Pigeon   CHIEF COMPLAINT:  Acute encephalopathy   INITIAL PRESENTATION:   80 y.o. female with PMH of dm, AF (on rate cntl and coumadin). Admitted 09/01/14 with working dx of UTI and acute encephalopathy. CT head Patient was started on empiric Levaquin for possible urinary tract infection. CT head was negative on admit. Had no improvement so MRI obtained on 10/14 showing:  worsening hydrocephalus and layering material in the occipital horns of the lateral ventricles w/ question of hemorrhage raising concern for meningitis. Transferred to [facility] for further evalation.    STUDIES:  CT head 10/14: Mild, likely age-appropriate, centralized volume loss without acute intracranial process MR brain 10/14: Acute hydrocephalus, worsened since 3 days ago. Layering material in the occipital horns of lateral ventricles, at least partly hemorrhagic. Question focal thrombus in the atrium of the right lateral ventricle, possibly associated with the choroid. This could  be the site of hemorrhage. No specific vascular lesion is identified. No intraparenchymal hemorrhage. The differential diagnosis does include meningitis.   SIGNIFICANT EVENTS: 10/12: admitted to [facility] for acute encephalopathy  10/14: No improvement. MR brain worrisome for worsening hydrocephalus and possibly meningitis. Moved to [facility]     HISTORY OF PRESENT ILLNESS:    80 y.o. female with PMH of diabetes managed with metformin, atrial fibrillation on aspirin/Coumadin and rate control with nifedipine and Coreg, who was admitted 09/01/14 with altered mental status after being evaluated in the ED and sent home the day before with back pain for which she was found to have a UTI and discharged on Keflex. The patient became increasingly confused prompting her family to bring her  back for further evaluation. She lives in [place] and is visiting her granddaughter locally. CT head on admission did not show acute intracranial findings. Patient was started on empiric Levaquin for possible urinary tract infection. No significant changes in mental status since the admission. She had an MRI on 10/14 to further evaluate. MRI findings showed worsening hydrocephalus and layering material in the occipital horns of the lateral ventricles w/ question of hemorrhage raising concern for meningitis.    PAST MEDICAL HISTORY :   has a past medical history of Diabetes mellitus with diabetic polyneuropathy; A-fib; Renal disorder; Hypertension; Anemia; Glaucoma; Carotid artery stenosis; and PVD (peripheral vascular disease).  has past surgical history that includes Replacement total knee (Right); Carotid endarterectomy (2015); Coronary artery bypass graft; and Ablation. Prior to Admission medications   Medication Sig Start Date End Date Taking? Authorizing Provider  acetaminophen (TYLENOL) 500 MG tablet Take 1,000 mg by mouth 3 (three) times daily as needed (pain).   Yes Historical Provider, MD  allopurinol (ZYLOPRIM) 100 MG tablet Take 100 mg by mouth 2 (two) times daily.    Yes Historical Provider, MD  aspirin EC 81 MG tablet Take 81 mg by mouth daily with breakfast.    Yes Historical Provider, MD  atorvastatin (LIPITOR) 80 MG tablet Take 80 mg by mouth daily after supper.    Yes Historical Provider, MD  carvedilol (COREG) 25 MG tablet Take 25 mg by mouth 2 (two) times daily with a meal.   Yes Historical Provider, MD  cephALEXin (KEFLEX) 500 MG capsule Take 1 capsule (500 mg total) by mouth 4 (four) times daily. 08/31/14  Yes Pilar Jarvis, MD  cholecalciferol (VITAMIN D) 1000  UNITS tablet Take 1,000 Units by mouth every other day.   Yes Historical Provider, MD  Cyanocobalamin (VITAMIN B-12 PO) Take 1 tablet by mouth every other day.   Yes Historical Provider, MD  ferrous sulfate 325 (65 FE) MG tablet  Take 325 mg by mouth daily with breakfast.   Yes Historical Provider, MD  furosemide (LASIX) 20 MG tablet Take 20 mg by mouth 2 (two) times daily.    Yes Historical Provider, MD  lisinopril (PRINIVIL,ZESTRIL) 5 MG tablet Take 5 mg by mouth daily with breakfast.    Yes Historical Provider, MD  metFORMIN (GLUCOPHAGE) 500 MG tablet Take 500 mg by mouth daily with breakfast.    Yes Historical Provider, MD  NIFEdipine (NIFEDICAL XL) 60 MG 24 hr tablet Take 60 mg by mouth daily after supper.    Yes Historical Provider, MD  Travoprost, BAK Free, (TRAVATAN) 0.004 % SOLN ophthalmic solution Place 1 drop into the right eye at bedtime.   Yes Historical Provider, MD  warfarin (COUMADIN) 5 MG tablet Take 5-10 mg by mouth daily after supper. Take 2 tablets (10 mg) on Monday, then take 1 tablet (5 mg) on Tuesday thru Sunday   Yes Historical Provider, MD   Allergies  Allergen Reactions  . Septra [Sulfamethoxazole-Trimethoprim] Hives    FAMILY HISTORY:  indicated that her mother is deceased. She indicated that her father is deceased.  SOCIAL HISTORY:  reports that she has never smoked. She does not have any smokeless tobacco history on file. She reports that she does not drink alcohol or use illicit drugs.  REVIEW OF SYSTEMS:  Unable   SUBJECTIVE: limited arousal   VITAL SIGNS: Temp:  [98.7 F (37.1 C)-98.8 F (37.1 C)] 98.7 F (37.1 C) (10/14 0518) Pulse Rate:  [86-87] 87 (10/14 0518) Resp:  [16-18] 16 (10/14 0518) BP: (182-198)/(80-89) 196/80 mmHg (10/14 0518) SpO2:  [98 %-100 %] 98 % (10/14 0518) HEMODYNAMICS:   VENTILATOR SETTINGS:   INTAKE / OUTPUT:  Intake/Output Summary (Last 24 hours) at 09/03/14 1503 Last data filed at 09/02/14 1758  Gross per 24 hour  Intake      0 ml  Output    500 ml  Net   -500 ml    PHYSICAL EXAMINATION: General:  Lethargic, to sternal rub will awaken, moves upper ext Neuro:  Cat left eye, rt 1 mm sluggish, moves uppers and lowers HEENT:  jvd low, some  rigidity Cardiovascular:  s1s2 RRR Lungs:  CTA Abdomen:  Soft, BS wnl, no r/g Musculoskeletal:  No edema Skin:  Left buttox small cluster nondermatomal pustules no joint swelling  LABS:  CBC  Recent Labs Lab 08/31/14 1807 09/01/14 1648 09/02/14 0530  WBC 9.1 9.9 11.1*  HGB 10.4* 12.9 12.5  HCT 32.6* 39.2 39.4  PLT 204 252 237   Coag's  Recent Labs Lab 09/01/14 1648 09/02/14 0530 09/03/14 0446  INR 1.51* 1.92* 2.10*   BMET  Recent Labs Lab 08/31/14 1807 09/01/14 1648 09/02/14 0530  NA 144 140 144  K 4.1 4.1 3.8  CL 108 103 108  CO2 23 20 20   BUN 24* 18 24*  CREATININE 1.37* 1.04 1.08  GLUCOSE 99 129* 149*   Electrolytes  Recent Labs Lab 08/31/14 1807 09/01/14 1648 09/02/14 0530  CALCIUM 9.1 9.4 8.6   Sepsis Markers  Recent Labs Lab 08/31/14 2119 09/01/14 1657  LATICACIDVEN 0.70 0.74   ABG No results found for this basename: PHART, PCO2ART, PO2ART,  in the last 168 hours Liver Enzymes  Recent Labs Lab 08/31/14 1807 09/01/14 1648 09/02/14 0530  AST 18 22 19   ALT 16 17 15   ALKPHOS 73 81 71  BILITOT 0.3 0.5 0.4  ALBUMIN 3.5 3.5 2.9*   Cardiac Enzymes  Recent Labs Lab 09/01/14 1648  TROPONINI <0.30   Glucose  Recent Labs Lab 09/02/14 0745 09/02/14 1152 09/02/14 1643 09/02/14 2108 09/03/14 0758 09/03/14 1255  GLUCAP 130* 140* 127* 135* 142* 125*    Imaging No results found.   ASSESSMENT / PLAN:  PULMONARY OETT A: Air way protection concerns P:   Pulse ox  O2 for sats > 92%  abg May need ett  CARDIOVASCULAR CVL A:  Chronic Afib  P:  Rate control Tele reversing coags for drain likely needed / LP  RENAL A: At risk ATN P:   Keep euvolemic  Trend chemistry  Acyclovir on board, no lasix  GASTROINTESTINAL A:  No acute  P:   NPO  If ett will OGT and feed  HEMATOLOGIC A:   Coumadin induced coagulapthy P:  Reverse INR w/ goal < 1.5 pending LP vs IVC insertion  4 STAT ffp ordered Vir K 5 mg  IV coags to follow Place scds D/c coumadin   INFECTIOUS A:   UTI  Acute hydrocephalus r/o meningitis/ encephalitis   P:   BCx2 10/12>>> UC 10/12: neg   Abx: CTX, start date 10/14, day 0/x ABX vanc start date 10/14 day 0/x abx ampicillin date 10/14 day 0/x Acyclovir start date 10/14>>>  LP or drain with analysis  ENDOCRINE A:   DM   P:   ssi protocol   NEUROLOGIC A:   Acute encephalitis / meningitis? R/o Intrvent blood complicating above  P:   RASS goal: n/a Neuro checks q 2 Supportive care  D/w NS, consider drian placement for analayis, FFP ordered STAT   Family updated: none   Interdisciplinary Family Meeting v Palliative Care Meeting:    TODAY'S SUMMARY: Acute hydro, concern meningitis / enceph, with debri favor bacterial, need sample CSF, hydro likely needs drain, aggressive BBB ABX, low threshold ETT, assess abg  I have personally obtained a history, examined the patient, evaluated laboratory and imaging results, formulated the assessment and plan and placed orders.   Mcarthur Rossettianiel J. Tyson AliasFeinstein, MD, FACP Pgr: [phone] [facility]  Pulmonary and Critical Care Medicine [facility] Pager: [phone]  09/03/2014, 3:03 PM

## 2014-09-03 NOTE — Progress Notes (Signed)
[facility] Medical [facility] to give report to receiving . Nurse stated receiving RN would return call in 5 minutes.

## 2014-09-03 NOTE — Progress Notes (Signed)
VASCULAR LAB PRELIMINARY  PRELIMINARY  PRELIMINARY  PRELIMINARY  Carotid duplex completed.    Preliminary report:  Right - 1% to 39% Internal Carotid Artery stenosis. Vertebral artery flow is antegrade. Left - 40% to 59% Internal Carotid Artery Stenosis. Vertebral artery flow is antegrade  Letesha Klecker, RVS 09/03/2014, 2:57 PM

## 2014-09-03 NOTE — Progress Notes (Signed)
Called [facility]. No transport available at this time. Advised by [facility] to notify EMS for transport. EMS notified.

## 2014-09-03 NOTE — Progress Notes (Addendum)
ANTIBIOTIC CONSULT NOTE - INITIAL  Pharmacy Consult for acyclovir and renal adjustment of antibiotics, ceftriaxone, ampicillin, vancomycin Indication: shingles and UTI and meningitis  Allergies  Allergen Reactions  . Septra [Sulfamethoxazole-Trimethoprim] Hives    Patient Measurements: Height: 4\' 11"  (149.9 cm) Weight: 139 lb 15.9 oz (63.5 kg) IBW/kg (Calculated) : 43.2 ABW: 54kg  Vital Signs: Temp: 98.7 F (37.1 C) (10/14 0518) Temp Source: Oral (10/14 0518) BP: 196/80 mmHg (10/14 0518) Pulse Rate: 87 (10/14 0518) Intake/Output from previous day: 10/13 0701 - 10/14 0700 In: 0  Out: 500 [Urine:500] Intake/Output from this shift:    Labs:  Recent Labs  08/31/14 1807 09/01/14 1648 09/02/14 0530  WBC 9.1 9.9 11.1*  HGB 10.4* 12.9 12.5  PLT 204 252 237  CREATININE 1.37* 1.04 1.08   Estimated Creatinine Clearance: 31.4 ml/min (by C-G formula based on Cr of 1.08).  Medical History: Past Medical History  Diagnosis Date  . Diabetes mellitus with diabetic polyneuropathy   . A-fib   . Renal disorder   . Hypertension   . Anemia   . Glaucoma     right eye   . Carotid artery stenosis   . PVD (peripheral vascular disease)     Medications:  Scheduled:  . allopurinol  100 mg Oral BID  . amiodarone  200 mg Oral Daily  . antiseptic oral rinse  7 mL Mouth Rinse q12n4p  . aspirin EC  81 mg Oral Q breakfast  . atorvastatin  80 mg Oral QPC supper  . chlorhexidine  15 mL Mouth Rinse BID  . furosemide  20 mg Intravenous Daily  . hydrALAZINE  5 mg Intravenous Q6H  . insulin aspart  0-9 Units Subcutaneous TID WC  . latanoprost  1 drop Right Eye QHS  . levofloxacin (LEVAQUIN) IV  750 mg Intravenous Q48H  . warfarin  5 mg Oral ONCE-1800  . Warfarin - Pharmacist Dosing Inpatient   Does not apply q1800   Infusions:  . sodium chloride 1,000 mL (09/03/14 0030)   Assessment: 86 yoF seen in ED 10/11 for back pain and AMS. Pt was treated for UTI at that time with ceftriaxone  x 1 in the ED and then sent home on PO cephalexin. Pt then admitted 10/12 because family was unable to give patient PO antibiotics due to progressive AMS. Patient was ruled out for CVA per CT and levofloxacin was started on admission for the UTI. Pt now with rash on back, MD suspects shingles and Pharmacy has been consulted to dose acyclovir for shingles. Noted patient unable to take PO medications, therefore will use the IV formulation.  Antiinfectives 10/11 >> ceftriaxone x1 10/12 >> levofloxacin >> 10/14 >> acyclovir >>    Labs / vitals Tmax: afebrile WBCs: 11.1 on 10/13 Renal: SCr 1.08, CrCl 31 ml/min CG, 44 ml/min N  Microbiology 10/11 UA: many bacteria, few squams, +nitrites 10/12 UA: grossly negative for UTI (received ceftriaxone x1 on 10/11) 10/12 urine: NGF 10/12 blood x2: IP  10/14:  D3 levofloxacin 750mg  IV q48h for UTI.  D1/7 acyclovir 430mg  IV q12h (10 mg/kg/dose using IBW) for shingles.  Normal acyclovir dosing is 10 mg/kg/dose q8h for 7 days, but reduced frequency to q12h for decreased renal function.    Goal of Therapy:  levofloxacin and acyclovir per indication and renal function  Plan:  - continue levofloxacin 750mg  IV q48h as ordered for UTI - start acyclovir 430mg  IV q12h for 7 days for shingles treatment - follow-up clinical course, culture results, renal  function - follow-up antibiotic de-escalation and length of therapy - follow up for ability to change to PO formulations  Thank you for the consult.  Ross LudwigJesse Mack Akers, PharmD, BCPS Pager: [phone] Pharmacy: [phone] 09/03/2014 10:06 AM    Addendum: 14:27 Brain MRI completed. Shows acute hydrocephalus, partly hemorrhagic focal thrombus in the atrium of the R ventricle, with meningitis in the differential diagnosis. Pharmacy has been consulted to dose ceftriaxone. TR[facility] MD note states she is contacting ID to decide whether or not to add vancomycin and ampicillin to regimen as is indicated for her  age. Lumbar puncture by IR ordered. Noted levofloxacin has been discontinued.  Plan - start ceftriaxone 2g IV q12h - follow-up additional antibiotics/ID conult  Thank you for the consult.  Ross LudwigJesse Mack Akers, PharmD, BCPS Pager: [phone] Pharmacy: [phone] 09/03/2014 2:39 PM   Addendum: 15:11 MD adding vancomycin and ampicillin as indicated for meningitis based on patient's age  Plan - ampicillin 1g IV q6h - vancomycin 1500mg  IV x1 as a loading dose - vancomycin 750mg  IV q24h to start 10/15 at 1600 - vancomycin trough at steady state if indicated - follow-up clinical course, culture results, renal function - follow-up antibiotic de-escalation and length of therapy  Thank you for the consult.  Ross LudwigJesse Mack Akers, PharmD, BCPS Pager: [phone] Pharmacy: [phone] 09/03/2014 3:20 PM   Addendum:  Changed Acyclovir to 500mg  IV q12h (Rounded to nearest vial size, still appropriate dose based on wt/ht).  Link SnufferJessica Leslee Haueter, PharmD, BCPS Clinical Pharmacist [phone] 09/04/2014, 1:49 PM

## 2014-09-03 NOTE — Progress Notes (Addendum)
Patient ID: Susan Calhoun, female   DOB: 03/14/1928, 80 y.o.   MRN: [id number] [facility] PROGRESS NOTE  Susan Calhoun [id number] DOB: 06/29/1928 DOA: 09/01/2014 PCP: Pcp Not In System  Brief narrative: 80 y.o. female with PMH of diabetes managed with metformin, atrial fibrillation on aspirin/Coumadin and rate control with nifedipine and Coreg, who was admitted 09/01/14 with altered mental status after being evaluated in the ED and sent home the day before with back pain for which she was found to have a UTI and discharged on Keflex. The patient became increasingly confused prompting her family to bring her back for further evaluation. She lives in [place] and is visiting her granddaughter locally. CT head on admission did not show acute intracranial findings. Patient was started on empiric Levaquin for possible urinary tract infection. No significant changes in mental status since the admission.  Assessment/Plan:   Principal Problem:  Acute encephalopathy  Most certainly this could be a stroke but we should rule out potential infections including but not limited to HSV encephalitis. Per patient's daughter, patient has been in good state of health until the day of the admission when her mental status suddenly deteriorated and she was confused, lethargic, minimally following commands. No slurred speech, no upper or lower extremity weakness, numbness.Patient may also require LP for further evaluation. Patient is on Levaquin for UTI but will stop this since urine culture shows no growth. Patient may need different antibiotics pending the results of MRI. Started acyclovir for possible shingles ?HSV- she has a small vesicular rash on right buttock area concerning for shingles. MRI of the brain is pending. Order placed for carotid Doppler as part of stroke work up. Lipid panel showed cholesterol 204 and LDL 110 otherwise HDL and triglycerides within normal limits. PT and OT evaluation once  patient able to participate.  Active Problems:  Small vesicular rash noted on right gluteal region  Concerning for shingles in patient with previous history of shingles. Started acyclovir, dosing per pharmacy. Diabetes mellitus with diabetic polyneuropathy  Metformin on hold. Continue sliding scale insulin. CBGs 94-150.  Hemoglobin A1c is 5.6%, indicating excellent outpatient control. A-fib, chronic Currently maintaining sinus rhythm.  Resumed amiodarone.  Therapeutic INR this am. Last dose of coumadin 09/02/2014. Will stop Coreg and will use low dose metoprolol 2.5 mg IV every 6 hours. Patient cant take anything PO due to altered mental status.  Accelerated Hypertension  By mouth meds on hold. Started Lasix 20 mg IV (patient is on 40 mg Lasix by mouth at home), hydralazine 5 mg IV every 6 hours, metoprolol 2.5 mg IV every 6 hours. May use hydralazine when necessary for blood pressure of 150/90. UTI (urinary tract infection)  On empiric Levaquin. Urine culture shows no growth. Stop Levaquin. DVT Prophylaxis  On therapeutic anticoagulation with Coumadin.   Code Status: Full.  Family Communication: Daughter at bedside.  Disposition Plan: Home when stable.   IV Access:   Peripheral IV Procedures and diagnostic studies:    Dg Chest 2 View 08/31/2014: Borderline cardiomegaly and lung hyperexpansion without acute cardiopulmonary disease.   Ct Head Wo Contrast 08/31/2014: Mild, likely age-appropriate, centralized volume loss without acute intracranial process.  Medical Consultants:   None. Other Consultants:   None. Anti-Infectives:   Levaquin 09/01/14---> 09/03/2014   Manson PasseyEVINE, ALMA, MD  [facility] Pager [phone]  If 7PM-7AM, please contact night-coverage [email or web address] Password [email or web address] 09/03/2014, 12:10 PM   LOS: 2 days    HPI/Subjective: No acute overnight events.top   Objective: Filed Vitals:  09/02/14 1413 09/02/14 2112 09/03/14 0219 09/03/14 0518  BP:  167/83 198/80 182/89 196/80  Pulse: 86 86  87  Temp: 98.5 F (36.9 C) 98.8 F (37.1 C)  98.7 F (37.1 C)  TempSrc: Axillary Oral  Oral  Resp: 20 18  16   Height:      Weight:      SpO2: 99% 100%  98%    Intake/Output Summary (Last 24 hours) at 09/03/14 1210 Last data filed at 09/02/14 1758  Gross per 24 hour  Intake      0 ml  Output    500 ml  Net   -500 ml    Exam:   General:  Pt is somewhat alert, not following any commands  Cardiovascular: rate controlled, S1/S2 appreciated   Respiratory: Clear to auscultation bilaterally, no wheezing, no crackles, no rhonchi  Abdomen: Soft, non tender, non distended, bowel sounds present  Extremities: No edema, pulses DP and PT palpable bilaterally  Neuro: confused, restless  Data Reviewed: Basic Metabolic Panel:  Recent Labs Lab 08/31/14 1807 09/01/14 1648 09/02/14 0530  NA 144 140 144  K 4.1 4.1 3.8  CL 108 103 108  CO2 23 20 20   GLUCOSE 99 129* 149*  BUN 24* 18 24*  CREATININE 1.37* 1.04 1.08  CALCIUM 9.1 9.4 8.6   Liver Function Tests:  Recent Labs Lab 08/31/14 1807 09/01/14 1648 09/02/14 0530  AST 18 22 19   ALT 16 17 15   ALKPHOS 73 81 71  BILITOT 0.3 0.5 0.4  PROT 7.5 8.1 6.8  ALBUMIN 3.5 3.5 2.9*   No results found for this basename: LIPASE, AMYLASE,  in the last 168 hours  Recent Labs Lab 08/31/14 2107 09/01/14 2116  AMMONIA 21 20   CBC:  Recent Labs Lab 08/31/14 1807 09/01/14 1648 09/02/14 0530  WBC 9.1 9.9 11.1*  NEUTROABS  --  8.4*  --   HGB 10.4* 12.9 12.5  HCT 32.6* 39.2 39.4  MCV 91.8 90.5 91.8  PLT 204 252 237   Cardiac Enzymes:  Recent Labs Lab 09/01/14 1648  TROPONINI <0.30   BNP: No components found with this basename: POCBNP,  CBG:  Recent Labs Lab 09/02/14 0745 09/02/14 1152 09/02/14 1643 09/02/14 2108 09/03/14 0758  GLUCAP 130* 140* 127* 135* 142*    URINE CULTURE     Status: None   Collection Time    09/01/14  8:52 PM      Result Value Ref Range  Status   Specimen Description URINE, RANDOM   Final   Value: NO GROWTH     [facility] at [facility]   Report Status 09/03/2014 FINAL   Final  CULTURE, BLOOD (ROUTINE X 2)     Status: None   Collection Time    09/01/14  9:18 PM      Result Value Ref Range Status   Specimen Description BLOOD RIGHT ARM   Final   Value:        BLOOD CULTURE RECEIVED NO GROWTH TO DATE CULTURE WILL BE HELD FOR 5 DAYS BEFORE ISSUING A FINAL NEGATIVE REPORT     Performed at [facility]   Report Status PENDING   Incomplete  CULTURE, BLOOD (ROUTINE X 2)     Status: None   Collection Time    09/01/14  9:45 PM      Result Value Ref Range Status   Specimen Description BLOOD RIGHT HAND   Final   Value:        BLOOD  CULTURE RECEIVED NO GROWTH TO DATE CULTURE WILL BE HELD FOR 5 DAYS BEFORE ISSUING A FINAL NEGATIVE REPORT     Performed at [facility]   Report Status PENDING   Incomplete     Scheduled Meds: . acyclovir  430 mg Intravenous Once  . acyclovir  430 mg Intravenous Q12H  . allopurinol  100 mg Oral BID  . amiodarone  200 mg Oral Daily  . aspirin EC  81 mg Oral Q breakfast  . atorvastatin  80 mg Oral QPC supper  . furosemide  20 mg Intravenous Daily  . hydrALAZINE  5 mg Intravenous Q6H  . insulin aspart  0-9 Units Subcutaneous TID WC  . latanoprost  1 drop Right Eye QHS  . levofloxacin (LEVAQUIN)   750 mg Intravenous Q48H  . LORazepam      . warfarin  5 mg Oral ONCE-1800

## 2014-09-03 NOTE — Progress Notes (Addendum)
MRI brain findings reviewed. Called neurosurgery for input on management in regards to acute hydrocephalus. ID also called since there is a finding of meningitis on MRI brain. Started rocephin, vanco and ampicillin.  Stopped anticoagulation ( warfarin, aspirin). [facility]. Spoke with [facility] and [facility]. I updated the patient;s daughter at the bedside. Manson Passeylma Enrique Weiss [facility] [phone].

## 2014-09-03 NOTE — Progress Notes (Signed)
SLP Cancellation Note  Patient Details Name: Susan Calhoun MRN: [id number] DOB: 01/10/1928   Cancelled treatment:       Reason Eval/Treat Not Completed:  (SlP spoke to RN and reviewed results of MRI, RN reports pt continues to be lethargic.  Will sign off, please reorder when indicated.  Thanks. )   Donavan Burnetamara Leveon Pelzer, MS [facility] SLP [phone]

## 2014-09-03 NOTE — Progress Notes (Signed)
PT Cancellation Note  Patient Details Name: Susan Calhoun MRN: [id number] DOB: 01/08/1928   Cancelled Treatment:    Reason Eval/Treat Not Completed: Medical issues which prohibited therapy (for MRI today, decreased alertness. return in AM.)   Rada HayHill, Kale Rondeau Elizabeth 09/03/2014, 11:59 AM Blanchard KelchKaren Nakeia Calvi PT [phone]

## 2014-09-03 NOTE — Progress Notes (Signed)
ANTICOAGULATION CONSULT NOTE - Initial Consult  Pharmacy Consult for warfarin -> enoxaparin Indication: atrial fibrillation  Allergies  Allergen Reactions  . Septra [Sulfamethoxazole-Trimethoprim] Hives    Patient Measurements: Height: 4\' 11"  (149.9 cm) Weight: 139 lb 15.9 oz (63.5 kg) IBW/kg (Calculated) : 43.2  Vital Signs: Temp: 98.7 F (37.1 C) (10/14 0518) Temp Source: Oral (10/14 0518) BP: 196/80 mmHg (10/14 0518) Pulse Rate: 87 (10/14 0518)  Labs:  Recent Labs  08/31/14 1807 09/01/14 1648 09/02/14 0530 09/03/14 0446  HGB 10.4* 12.9 12.5  --   HCT 32.6* 39.2 39.4  --   PLT 204 252 237  --   LABPROT 21.3* 18.3* 22.2* 23.7*  INR 1.84* 1.51* 1.92* 2.10*  CREATININE 1.37* 1.04 1.08  --   TROPONINI  --  <0.30  --   --     Estimated Creatinine Clearance: 31.4 ml/min (by C-G formula based on Cr of 1.08).   Medical History: Past Medical History  Diagnosis Date  . Diabetes mellitus with diabetic polyneuropathy   . A-fib   . Renal disorder   . Hypertension   . Anemia   . Glaucoma     right eye   . Carotid artery stenosis   . PVD (peripheral vascular disease)     Medications:  Scheduled:  . acyclovir  430 mg Intravenous Once  . acyclovir  430 mg Intravenous Q12H  . allopurinol  100 mg Oral BID  . amiodarone  200 mg Oral Daily  . antiseptic oral rinse  7 mL Mouth Rinse q12n4p  . aspirin EC  81 mg Oral Q breakfast  . atorvastatin  80 mg Oral QPC supper  . chlorhexidine  15 mL Mouth Rinse BID  . furosemide  20 mg Intravenous Daily  . hydrALAZINE  5 mg Intravenous Q6H  . insulin aspart  0-9 Units Subcutaneous TID WC  . latanoprost  1 drop Right Eye QHS  . levofloxacin (LEVAQUIN) IV  750 mg Intravenous Q48H  . LORazepam  1 mg Intravenous Once  . warfarin  5 mg Oral ONCE-1800  . Warfarin - Pharmacist Dosing Inpatient   Does not apply q1800   Infusions:  . sodium chloride 1,000 mL (09/03/14 0030)    Assessment: [age] yoF admitted on 10/12 with AMS,  suspected UTI.  She is on chronic warfarin anticoagulation for atrial fibrillation. CHADS2 score = 3. Pharmacy originally consulted to continue warfarin dosing inpatient however patient is unable to take PO medication and patient will be switched to full dose enoxaparin therapy. MD notes patient at high risk for CVA given carotid artery disease, chronic atrial fibrillation, diabetes and hypertension so bridging with enoxaparin is warranted.  Home warfarin dose 5mg  daily except 10mg  mondays, with her last dose PTA taken on 10/12 (10mg ).  INR on admission: 1.51, below therapeutic range Drug-drug interactions:  Levaquin may increase the INR. Amiodarone also started 10/13 which can increase the INR as well.   10/14: INR: 2.10, within goal range of 2-3            CBC: stable, Hgb 12.5, Plt 237            No bleeding issues noted.             SCr 1.08, CrCl 31 ml/min  INR trend: 1.51 > 1.92 > 2.10 Warfarin doses: no doses have been administered this admission (patient refused or too lethargic)  Enoxaparin will be started once INR becomes sub-therapeutic (<2)   Goal of Therapy:  Anti-Xa level  0.6-1 units/ml 4hrs after LMWH dose given INR 2-3 Monitor platelets by anticoagulation protocol: Yes   Plan:  - no warfarin tonight - follow- up INR in AM - start enoxaparin 60mg  SQ q24h (1mg /kg SQ q24h) once INR is less than 2  - using renally adjusted dosing frequency even though patient is on borderline to receive q12h dosing due to advanced age and low weight - daily INR until below 2 - follow up renal function, CBC - monitor for signs of bleeding  Thank you for the consult.  Ross LudwigJesse Griffin Gerrard Akers, PharmD, BCPS Pager: [phone] Pharmacy: [phone] 09/03/2014 10:56 AM

## 2014-09-04 ENCOUNTER — Inpatient Hospital Stay (HOSPITAL_COMMUNITY): Payer: Medicare (Managed Care)

## 2014-09-04 DIAGNOSIS — R21 Rash and other nonspecific skin eruption: Secondary | ICD-10-CM

## 2014-09-04 LAB — PREPARE FRESH FROZEN PLASMA
UNIT DIVISION: 0
Unit division: 0

## 2014-09-04 LAB — CBC
HCT: 31.9 % — ABNORMAL LOW (ref 36.0–46.0)
Hemoglobin: 10.6 g/dL — ABNORMAL LOW (ref 12.0–15.0)
MCH: 29.9 pg (ref 26.0–34.0)
MCHC: 33.2 g/dL (ref 30.0–36.0)
MCV: 90.1 fL (ref 78.0–100.0)
PLATELETS: 169 10*3/uL (ref 150–400)
RBC: 3.54 MIL/uL — ABNORMAL LOW (ref 3.87–5.11)
RDW: 17.2 % — AB (ref 11.5–15.5)
WBC: 11.4 10*3/uL — AB (ref 4.0–10.5)

## 2014-09-04 LAB — CSF CELL COUNT WITH DIFFERENTIAL
Lymphs, CSF: 7 % — ABNORMAL LOW (ref 40–80)
Monocyte-Macrophage-Spinal Fluid: 8 % — ABNORMAL LOW (ref 15–45)
RBC Count, CSF: 70000 /mm3 — ABNORMAL HIGH
SEGMENTED NEUTROPHILS-CSF: 85 % — AB (ref 0–6)
TUBE #: 1
WBC, CSF: 168 /mm3 (ref 0–5)

## 2014-09-04 LAB — GLUCOSE, CAPILLARY
GLUCOSE-CAPILLARY: 128 mg/dL — AB (ref 70–99)
Glucose-Capillary: 100 mg/dL — ABNORMAL HIGH (ref 70–99)
Glucose-Capillary: 103 mg/dL — ABNORMAL HIGH (ref 70–99)
Glucose-Capillary: 107 mg/dL — ABNORMAL HIGH (ref 70–99)
Glucose-Capillary: 146 mg/dL — ABNORMAL HIGH (ref 70–99)
Glucose-Capillary: 99 mg/dL (ref 70–99)

## 2014-09-04 LAB — PROTEIN AND GLUCOSE, CSF
Glucose, CSF: 78 mg/dL — ABNORMAL HIGH (ref 43–76)
Total  Protein, CSF: 139 mg/dL — ABNORMAL HIGH (ref 15–45)

## 2014-09-04 LAB — BASIC METABOLIC PANEL
ANION GAP: 14 (ref 5–15)
BUN: 35 mg/dL — ABNORMAL HIGH (ref 6–23)
CHLORIDE: 111 meq/L (ref 96–112)
CO2: 22 mEq/L (ref 19–32)
Calcium: 8.4 mg/dL (ref 8.4–10.5)
Creatinine, Ser: 1.27 mg/dL — ABNORMAL HIGH (ref 0.50–1.10)
GFR, EST AFRICAN AMERICAN: 44 mL/min — AB (ref >90)
GFR, EST NON AFRICAN AMERICAN: 38 mL/min — AB (ref >90)
Glucose, Bld: 152 mg/dL — ABNORMAL HIGH (ref 70–99)
POTASSIUM: 3.2 meq/L — AB (ref 3.7–5.3)
Sodium: 147 mEq/L (ref 137–147)

## 2014-09-04 LAB — GRAM STAIN

## 2014-09-04 LAB — PROTIME-INR
INR: 1.22 (ref 0.00–1.49)
Prothrombin Time: 15.5 seconds — ABNORMAL HIGH (ref 11.6–15.2)

## 2014-09-04 LAB — HERPES SIMPLEX VIRUS(HSV) DNA BY PCR
HSV 1 DNA: NOT DETECTED
HSV 2 DNA: NOT DETECTED

## 2014-09-04 MED ORDER — MIDAZOLAM HCL 2 MG/2ML IJ SOLN
2.0000 mg | Freq: Once | INTRAMUSCULAR | Status: AC
  Administered 2014-09-04: 1 mg via INTRAVENOUS
  Filled 2014-09-04: qty 2

## 2014-09-04 MED ORDER — POTASSIUM CHLORIDE 10 MEQ/100ML IV SOLN
10.0000 meq | INTRAVENOUS | Status: AC
Start: 2014-09-04 — End: 2014-09-04
  Administered 2014-09-04 (×3): 10 meq via INTRAVENOUS
  Filled 2014-09-04 (×2): qty 100

## 2014-09-04 MED ORDER — DEXTROSE 5 % IV SOLN
500.0000 mg | Freq: Two times a day (BID) | INTRAVENOUS | Status: DC
  Filled 2014-09-04: qty 10

## 2014-09-04 MED ORDER — VITAL HIGH PROTEIN PO LIQD
1000.0000 mL | ORAL | Status: DC
  Filled 2014-09-04 (×2): qty 1000

## 2014-09-04 MED ORDER — MORPHINE SULFATE 2 MG/ML IJ SOLN: 2.0000 mg | Freq: Once | INTRAMUSCULAR | Status: DC

## 2014-09-04 MED ORDER — FENTANYL CITRATE 0.05 MG/ML IJ SOLN
INTRAMUSCULAR | Status: AC
  Administered 2014-09-04: 25 ug
  Filled 2014-09-04: qty 2

## 2014-09-04 MED ORDER — PRO-STAT SUGAR FREE PO LIQD
30.0000 mL | Freq: Two times a day (BID) | ORAL | Status: AC
  Administered 2014-09-04: 23:00:00
  Administered 2014-09-05: 30 mL
  Filled 2014-09-04 (×3): qty 30

## 2014-09-04 MED ORDER — VITAL AF 1.2 CAL PO LIQD
1000.0000 mL | ORAL | Status: DC
  Administered 2014-09-04 – 2014-09-24 (×19): 1000 mL
  Filled 2014-09-04 (×14): qty 1000
  Filled 2014-09-04: qty 237
  Filled 2014-09-04 (×15): qty 1000

## 2014-09-04 NOTE — Progress Notes (Signed)
BP elevated . Prn hydralazine if SBP >220 or DBP > 105. Family concerned about BP. Paged NP Donnamarie PoagK. Kirby. No change in previous orders. Made family aware that would monitor BP more frequently than ordered.

## 2014-09-04 NOTE — Op Note (Signed)
Preop Dx: IVH and Hydrocephalus Postop Dx: Same Procedure: Right frontal IVC placement Surgeon: Venetia MaxonStern Anesthesia: Lidocaine and IV sedation with Morphine and Versed Complications: None  Patient has HCP and IVH with obtundation.  Right frontal scalp was prepped and draped after shaving this region.  Area of planned incision was infiltrated with lidocaine with epinephrine.  A stab incision was created and trephine made. The dura was incised and IVC drain was placed with brisk return of blood tinged CSF under pressure.  The drain was tunneled, anchored and hooked to the Buretrol after incisions were closed with 3-0 Nylon sutures.  The wound was dressed with a sterile occlusive dressing.  The patient appeared to tolerate the procedure well.  3 cc CSF was collected in 4 tubes to be sent for culture and routine studies.

## 2014-09-04 NOTE — Progress Notes (Signed)
Patient stilll with no void since foley cath removal. When asked if need to void patient shakes head no and even said "no" when daughter asked. Pt not appearing uncomfortable.  Paged NP on call. Orders received. Increased IVF rate for bolus. Will reck back.

## 2014-09-04 NOTE — Progress Notes (Signed)
Still with no output. Bladder scan > . Paged NP. Orders received. Will replace foley.

## 2014-09-04 NOTE — Progress Notes (Signed)
Patient ID: Susan Calhoun, female   DOB: 07/09/1928, 80 y.o.   MRN: [id number] No change in exam. Remained somnolent. [id number]. The physical therapist documented that she opened her eyes one time and followed one simple command earlier when sitting on the side of the bed for therapy. Ventriculostomy drain is patent. CSF thus far looks negative for evidence of infection. Following. CT scan of head pending.

## 2014-09-04 NOTE — Consult Note (Signed)
PULMONARY / CRITICAL CARE MEDICINE - consult   Name: Susan Calhoun MRN: [id number] DOB: 27-Apr-1928    ADMISSION DATE:  09/01/2014 CONSULTATION DATE:  10/14   REFERRING MD :  Elisabeth Pigeon   CHIEF COMPLAINT:  Acute encephalopathy   INITIAL PRESENTATION:   80 y.o. female with PMH of dm, AF (on rate cntl and coumadin). Admitted 09/01/14 with working dx of UTI and acute encephalopathy. CT head Patient was started on empiric Levaquin for possible urinary tract infection. CT head was negative on admit. Had no improvement so MRI obtained on 10/14 showing:  worsening hydrocephalus and layering material in the occipital horns of the lateral ventricles w/ question of hemorrhage raising concern for meningitis. Transferred to [facility] for further evalation.   STUDIES:  CT head 10/14: Mild, likely age-appropriate, centralized volume loss without acute intracranial process MR brain 10/14: Acute hydrocephalus, worsened since 3 days ago. Layering material in the occipital horns of lateral ventricles, at least partly hemorrhagic. Question focal thrombus in the atrium of the right lateral ventricle, possibly associated with the choroid. This could  be the site of hemorrhage. No specific vascular lesion is identified. No intraparenchymal hemorrhage. The differential diagnosis does include meningitis.   SIGNIFICANT EVENTS: 10/12: admitted to [facility] for acute encephalopathy  10/14: No improvement. MR brain worrisome for worsening hydrocephalus and possibly meningitis. Moved to [facility]   10/14- coags corrected and drain IVC placed, high pressure noted  SUBJECTIVE:  S/p Right frontal IVC placement   VITAL SIGNS: Temp:  [97.3 F (36.3 C)-99.7 F (37.6 C)] 99.4 F (37.4 C) (10/15 1204) Pulse Rate:  [72-109] 83 (10/15 1200) Resp:  [12-27] 15 (10/15 1200) BP: (52-208)/(24-158) 180/47 mmHg (10/15 1200) SpO2:  [94 %-100 %] 95 % (10/15 1230) HEMODYNAMICS:   VENTILATOR SETTINGS:   INTAKE / OUTPUT:  Intake/Output  Summary (Last 24 hours) at 09/04/14 1322 Last data filed at 09/04/14 1200  Gross per 24 hour  Intake 6056.2 ml  Output   2912 ml  Net 3144.2 ml    PHYSICAL EXAMINATION: General:  Lethargic, to sternal rub will awaken, moves upper ext Neuro:  Cat left eye, rt 1 mm sluggish, drain in place HEENT:  jvd low, some rigidity Cardiovascular:  s1s2 RRR Lungs:  CTA, no ronchi Abdomen:  Soft, BS wnl, no r/g Musculoskeletal:  No edema Skin:  Left buttox small cluster nondermatomal pustules -not changed  LABS:  CBC  Recent Labs Lab 09/01/14 1648 09/02/14 0530 09/04/14 0243  WBC 9.9 11.1* 11.4*  HGB 12.9 12.5 10.6*  HCT 39.2 39.4 31.9*  PLT 252 237 169   Coag's  Recent Labs Lab 09/03/14 0446 09/03/14 2245 09/04/14 0243  APTT  --  30  --   INR 2.10* 1.46 1.22   BMET  Recent Labs Lab 09/01/14 1648 09/02/14 0530 09/04/14 0243  NA 140 144 147  K 4.1 3.8 3.2*  CL 103 108 111  CO2 20 20 22   BUN 18 24* 35*  CREATININE 1.04 1.08 1.27*  GLUCOSE 129* 149* 152*   Electrolytes  Recent Labs Lab 09/01/14 1648 09/02/14 0530 09/04/14 0243  CALCIUM 9.4 8.6 8.4   Sepsis Markers  Recent Labs Lab 08/31/14 2119 09/01/14 1657  LATICACIDVEN 0.70 0.74   ABG  Recent Labs Lab 09/03/14 1738  PHART 7.465*  PCO2ART 27.3*  PO2ART 90.8   Liver Enzymes  Recent Labs Lab 08/31/14 1807 09/01/14 1648 09/02/14 0530  AST 18 22 19   ALT 16 17 15   ALKPHOS 73 81 71  BILITOT  0.3 0.5 0.4  ALBUMIN 3.5 3.5 2.9*   Cardiac Enzymes  Recent Labs Lab 09/01/14 1648  TROPONINI <0.30   Glucose  Recent Labs Lab 09/03/14 1633 09/03/14 2014 09/04/14 0013 09/04/14 0406 09/04/14 0817 09/04/14 1202  GLUCAP 167* 113* 146* 128* 99 100*    Imaging Mr Brain W Wo Contrast  09/03/2014   CLINICAL DATA:  New onset altered mental status. Disorientation. Incontinence. Acute encephalopathy. Somnolence.  EXAM: MRI HEAD WITHOUT AND WITH CONTRAST  TECHNIQUE: Multiplanar, multiecho  pulse sequences of the brain and surrounding structures were obtained without and with intravenous contrast.  CONTRAST:  13mL MULTIHANCE GADOBENATE DIMEGLUMINE 529 MG/ML IV SOLN  COMPARISON:  Head CT 08/31/2014  FINDINGS: Diffusion imaging does not show any acute or subacute infarction.  There is hydrocephalus of the lateral and third ventricles. There is layering material in the occipital forms of both lateral ventricles. This shows some susceptibility suggesting that it may be in part hemorrhagic. There appears to be slightly more susceptibility in the region of the atrium of the right lateral ventricle. This could be a focal collide or could indicate that this site of hemorrhage could be the choroid in that region. I think the ventricles are slightly more prominent than were seen on the CT scan of 3 days ago. The brain parenchyma shows minimal small vessel change of the deep white matter. Some of the periventricular signal intensity could relate to transependymal resorption of CSF. After contrast administration, no abnormal enhancement occurs. No evidence of mass lesion. No extra-axial collection. No pituitary mass. No inflammatory sinus disease. No skull or skullbase lesion.  IMPRESSION: Acute hydrocephalus, worsened since 3 days ago. Layering material in the occipital horns of lateral ventricles, at least partly hemorrhagic. Question focal thrombus in the atrium of the right lateral ventricle, possibly associated with the choroid. This could be the site of hemorrhage. No specific vascular lesion is identified. No intraparenchymal hemorrhage. The differential diagnosis does include meningitis.  I am in the process of contacting the clinical team to discuss this result. Pages are pending at this time.   Electronically Signed   By: Paulina FusiMark  Shogry M.D.   On: 09/03/2014 13:18     ASSESSMENT / PLAN:  PULMONARY OETT A: Air way protection ok for now P:   Pulse ox  O2 for sats > 92%  Low threshold ETT No  further abg reviewed  CARDIOVASCULAR A:  Chronic Afib  For CPP management P:  Rate control on own Tele Allow pos balance likely would benefit cvp, needs further access, place LINE Goal MAP 80  RENAL A: ARF, ATN, hypoK P:   Keep euvolemic to pos k supp  Trend chemistry in am  Acyclovir on board, lft again in am  NA goal 150-154 ok , until ct for edema re evaluated No free water  GASTROINTESTINAL A:  NPO P:   NPO  Place OG, strict asp precaution, start feeding  HEMATOLOGIC A:   Coumadin induced coagulapthy- resolved P:  INR 1.22 coags again in am, may rise SCDs  INFECTIOUS A:   UTI  No evidence meningitis Unlikely encephalitis  Minimal suspicion for bacterial component P:   BCx2 10/12>>> UC 10/12: neg  CSF with WBC 168, neutros 85, lymphs 7 ' CSF cx >>  Abx: CTX, start date 10/14, d/c ABX vanc start date 10/14 d/c abx ampicillin date 10/14 d/c Acyclovir start date 10/14>>>  LP or drain with analysis Unroof vesicles to send? tzank smear Can we dc isolation, will  d/w id i do NOT think we need isolation further  ENDOCRINE A:   DM   P:   ssi protocol   NEUROLOGIC A:   Hydrocephalus s/p ventricular drain ?viral meningitis (No leptomeningela enhancement)  IV bleed as primary event? P:   RASS goal: n/a Neuro checks q 2 Supportive care  For CT head  TODAY'S SUMMARY: CT head, may need ett, start feeds, dc all ABX except HSV coverage  Charlane FerrettiMelanie C Marsh, MD Family Medicine PGY-2 Please page or call with questions  09/04/2014, 1:22 PM  Ccm time 30 min  I have fully examined this patient and agree with above findings.     Ccm time 30 min   I have fully examined this patient and agree with above findings.    And edited in full  Mcarthur RossettiDaniel J. Tyson AliasFeinstein, MD, FACP Pgr: [phone] [facility]

## 2014-09-04 NOTE — Progress Notes (Signed)
Unable to get patient to take coumadin.

## 2014-09-04 NOTE — Progress Notes (Signed)
Patient has been admitted to room 1342 from ED with UTI, confusion, & mental status changes. Patient open eyes to being touched and name being called at times. Unable to follow commands, sleepy, but opens eyes easily. Daughter and granddaughter at bedside. Family oriented to room and unit routines.

## 2014-09-04 NOTE — Progress Notes (Signed)
INITIAL NUTRITION ASSESSMENT  DOCUMENTATION CODES Per approved criteria  -Not Applicable   INTERVENTION:  Initiate TF via OGT with Vital AF 1.2 at 25 ml/h and Prostat 30 ml BID on day 1; on day 2, d/c Prostat and increase to goal rate of 45 ml/h (1080 ml per day) to provide 1296 kcals, 81 gm protein, 876 ml free water daily.  NUTRITION DIAGNOSIS: Inadequate oral intake related to inability to eat as evidenced by NPO status.   Goal: Intake to meet >90% of estimated nutrition needs.  Monitor:  TF tolerance/adequacy, weight trend, labs, vent status.  Reason for Assessment: MD Consult for TF initiation and management.  80 y.o. female  Admitting Dx: Acute encephalopathy  ASSESSMENT: Admitted to [facility] on 09/01/14 with working dx of UTI and acute encephalopathy. CT head was negative on admit. Had no improvement so MRI obtained on 10/14 showing: worsening hydrocephalus and layering material in the occipital horns of the lateral ventricles w/ question of hemorrhage raising concern for meningitis. [facility]   Nutrition focused physical exam completed.  No muscle or subcutaneous fat depletion noticed.  Patient is currently intubated on ventilator support MV: 9.8 L/min Temp (24hrs), Avg:98.6 F (37 C), Min:97.3 F (36.3 C), Max:99.7 F (37.6 C)   Height: Ht Readings from Last 1 Encounters:  09/01/14 4\' 11"  (1.499 m)    Weight: Wt Readings from Last 1 Encounters:  09/01/14 139 lb 15.9 oz (63.5 kg)    Ideal Body Weight: 44.5 kg  % Ideal Body Weight: 143%  Wt Readings from Last 10 Encounters:  09/01/14 139 lb 15.9 oz (63.5 kg)  08/31/14 140 lb (63.504 kg)    Usual Body Weight: unknown  % Usual Body Weight: N/A  BMI:  Body mass index is 28.26 kg/(m^2).  Estimated Nutritional Needs: Kcal: 1328 Protein: 80-95 gm Fluid: 1.5 L  Skin: no issues  Diet Order: NPO  EDUCATION NEEDS: -Education not appropriate at this  time   Intake/Output Summary (Last 24 hours) at 09/04/14 1408 Last data filed at 09/04/14 1300  Gross per 24 hour  Intake 6056.2 ml  Output   2920 ml  Net 3136.2 ml    Last BM: PTA   Labs:   Recent Labs Lab 09/01/14 1648 09/02/14 0530 09/04/14 0243  NA 140 144 147  K 4.1 3.8 3.2*  CL 103 108 111  CO2 20 20 22   BUN 18 24* 35*  CREATININE 1.04 1.08 1.27*  CALCIUM 9.4 8.6 8.4  GLUCOSE 129* 149* 152*    CBG (last 3)   Recent Labs  09/04/14 0406 09/04/14 0817 09/04/14 1202  GLUCAP 128* 99 100*    Scheduled Meds: . sodium chloride   Intravenous Once  . acyclovir  500 mg Intravenous Q12H  . amiodarone  200 mg Oral Daily  . antiseptic oral rinse  7 mL Mouth Rinse q12n4p  . atorvastatin  80 mg Oral QPC supper  . chlorhexidine  15 mL Mouth Rinse BID  . feeding supplement (VITAL HIGH PROTEIN)  1,000 mL Per Tube Q24H  . hydrALAZINE  5 mg Intravenous Q6H  . insulin aspart  0-9 Units Subcutaneous 6 times per day  . latanoprost  1 drop Right Eye QHS  . metoprolol  2.5 mg Intravenous 4 times per day  . midazolam  2 mg Intravenous Once  .  morphine injection  2 mg Intravenous Once  . pantoprazole (PROTONIX) IV  40 mg Intravenous Q24H    Continuous Infusions: . sodium  chloride 75 mL/hr at 09/03/14 2252    Past Medical History  Diagnosis Date  . Diabetes mellitus with diabetic polyneuropathy   . A-fib   . Renal disorder   . Hypertension   . Anemia   . Glaucoma     right eye   . Carotid artery stenosis   . PVD (peripheral vascular disease)     Past Surgical History  Procedure Laterality Date  . Replacement total knee Right   . Carotid endarterectomy  2015  . Coronary artery bypass graft    . Ablation      cardiac for arrthymia     Joaquin CourtsKimberly Farryn Linares, RD, LDN, CNSC Pager [phone] After Hours Pager [phone]

## 2014-09-04 NOTE — Progress Notes (Addendum)
NP Susan Calhoun Craige CottaKirby came to assess rash on r buttock. ? Shingles but in a small cluster only. +vesicles. Stated for area to be monitored.

## 2014-09-04 NOTE — Progress Notes (Signed)
[facility]  Date of Admission:  09/01/2014  Antibiotics: acyclovir  Subjective: Non verbal  Objective: Temp:  [97.3 F (36.3 C)-99.7 F (37.6 C)] 99.4 F (37.4 C) (10/15 1204) Pulse Rate:  [72-109] 83 (10/15 1200) Resp:  [12-27] 15 (10/15 1200) BP: (52-208)/(24-158) 180/47 mmHg (10/15 1200) SpO2:  [94 %-100 %] 95 % (10/15 1230)  General: not awake, post surgical Skin: small crop of erythema at buttock, unchanged, no new areas Lungs: CTA B Cor: RRR Abdomen: soft, nt HEENT: shunt placed  Lab Results Lab Results  Component Value Date   WBC 11.4* 09/04/2014   HGB 10.6* 09/04/2014   HCT 31.9* 09/04/2014   MCV 90.1 09/04/2014   PLT 169 09/04/2014    Lab Results  Component Value Date   CREATININE 1.27* 09/04/2014   BUN 35* 09/04/2014   NA 147 09/04/2014   K 3.2* 09/04/2014   CL 111 09/04/2014   CO2 22 09/04/2014    Lab Results  Component Value Date   ALT 15 09/02/2014   AST 19 09/02/2014   ALKPHOS 71 09/02/2014   BILITOT 0.4 09/02/2014      Microbiology: Recent Results (from the past 240 hour(s))  URINE CULTURE     Status: None   Collection Time    09/01/14  8:52 PM      Result Value Ref Range Status   Specimen Description URINE, RANDOM   Final   Special Requests NONE   Final   Culture  Setup Time     Final   Value: 09/02/2014 05:33     Performed at [facility] Count     Final   Value: NO GROWTH     Performed at [facility]   Culture     Final   Value: NO GROWTH     Performed at [facility]   Report Status 09/03/2014 FINAL   Final  CULTURE, BLOOD (ROUTINE X 2)     Status: None   Collection Time    09/01/14  9:18 PM      Result Value Ref Range Status   Specimen Description BLOOD RIGHT ARM   Final   Special Requests BOTTLES DRAWN AEROBIC AND ANAEROBIC 4CC   Final   Culture  Setup Time     Final   Value: 09/02/2014 04:13     Performed at [facility]   Culture     Final   Value:        BLOOD CULTURE RECEIVED NO GROWTH TO DATE CULTURE WILL BE HELD FOR 5 DAYS BEFORE ISSUING A FINAL NEGATIVE REPORT     Performed at [facility]   Report Status PENDING   Incomplete  CULTURE, BLOOD (ROUTINE X 2)     Status: None   Collection Time    09/01/14  9:45 PM      Result Value Ref Range Status   Specimen Description BLOOD RIGHT HAND   Final   Special Requests BOTTLES DRAWN AEROBIC ONLY 8CC   Final   Culture  Setup Time     Final   Value: 09/02/2014 04:12     Performed at [facility]   Culture     Final   Value:        BLOOD CULTURE RECEIVED NO GROWTH TO DATE CULTURE WILL BE HELD FOR 5 DAYS BEFORE ISSUING A FINAL NEGATIVE REPORT     Performed at [facility]   Report  Status PENDING   Incomplete  MRSA PCR SCREENING     Status: None   Collection Time    09/03/14  4:41 PM      Result Value Ref Range Status   MRSA by PCR NEGATIVE  NEGATIVE Final   Comment:            The GeneXpert MRSA Assay (FDA     approved for NASAL specimens     only), is one component of a     comprehensive MRSA colonization     surveillance program. It is not     intended to diagnose MRSA     infection nor to guide or     monitor treatment for     MRSA infections.  CSF CULTURE     Status: None   Collection Time    09/03/14  5:40 PM      Result Value Ref Range Status   Specimen Description CSF   Final   Special Requests NONE   Final   Gram Stain     Final   Value: RARE WBC PRESENT,BOTH PMN AND MONONUCLEAR     NO ORGANISMS SEEN     [facility]formed [facility]     Performed at [facility]   Culture PENDING   Incomplete   Report Status PENDING   Incomplete  GRAM STAIN     Status: None   Collection Time    09/03/14  5:40 PM      Result Value Ref Range Status   Specimen Description CSF   Final   Special Requests NONE   Final   Gram Stain     Final   Value: RARE WBC PRESENT,BOTH PMN AND MONONUCLEAR     NO ORGANISMS SEEN   Report Status 09/04/2014  FINAL   Final    Studies/Results: Mr Lodema PilotBrain W Wo Contrast  09/03/2014   CLINICAL DATA:  New onset altered mental status. Disorientation. Incontinence. Acute encephalopathy. Somnolence.  EXAM: MRI HEAD WITHOUT AND WITH CONTRAST  TECHNIQUE: Multiplanar, multiecho pulse sequences of the brain and surrounding structures were obtained without and with intravenous contrast.  CONTRAST:  13mL MULTIHANCE GADOBENATE DIMEGLUMINE 529 MG/ML IV SOLN  COMPARISON:  Head CT 08/31/2014  FINDINGS: Diffusion imaging does not show any acute or subacute infarction.  There is hydrocephalus of the lateral and third ventricles. There is layering material in the occipital forms of both lateral ventricles. This shows some susceptibility suggesting that it may be in part hemorrhagic. There appears to be slightly more susceptibility in the region of the atrium of the right lateral ventricle. This could be a focal collide or could indicate that this site of hemorrhage could be the choroid in that region. I think the ventricles are slightly more prominent than were seen on the CT scan of 3 days ago. The brain parenchyma shows minimal small vessel change of the deep white matter. Some of the periventricular signal intensity could relate to transependymal resorption of CSF. After contrast administration, no abnormal enhancement occurs. No evidence of mass lesion. No extra-axial collection. No pituitary mass. No inflammatory sinus disease. No skull or skullbase lesion.  IMPRESSION: Acute hydrocephalus, worsened since 3 days ago. Layering material in the occipital horns of lateral ventricles, at least partly hemorrhagic. Question focal thrombus in the atrium of the right lateral ventricle, possibly associated with the choroid. This could be the site of hemorrhage. No specific vascular lesion is identified. No intraparenchymal hemorrhage. The differential diagnosis does include meningitis.  I am in the process of contacting the clinical team to  discuss this result. Pages are pending at this time.   Electronically Signed   By: Paulina FusiMark  Shogry M.D.   On: 09/03/2014 13:18    Assessment/Plan: 1) hydrocephalus - CSF, course, MRI, not consistent with meningoencephalitis.  No indication for antibiotics.  2) rash - not consistent with HSV or VZV zoster.  I will d/c isolation, d/c acyclovir.    I will sign off, please call with questions.    Staci RighterOMER, Anique Beckley, MD [facility] [email or web address] [phone] pager   [phone] cell 09/04/2014, 2:19 PM

## 2014-09-04 NOTE — Evaluation (Signed)
Physical Therapy Evaluation Patient Details Name: Susan Calhoun MRN: [id number] DOB: 02/14/1928 Today's Date: 09/04/2014   History of Present Illness  80 yo wf admitted on 09/01/14 with MS changes. Initial CT showed some ventriculomegaly. Was being treated for UTI. MS changes worsened and an MRI was performed revelaed IVH, Ventricular drain placed on 09/04/14.  Clinical Impression  Limited evaluation secondary to patient level of arousal; Pt presents with lethargy and +2 dependence in all mobility. She opened her eyes x 1 and followed a simple command x 1 after having sat EOB for several minutes. She withdraws to pain stimulation and grimaces with repositioning in bed. Nsg aware.  BP elevated at start of session 170s systolic but decreased with sitting EOB 159 systolic. Will trial PT acutely pending arousal level and ability to participate.     Follow Up Recommendations SNF;Supervision/Assistance - 24 hour    Equipment Recommendations   (TBD)    Recommendations for Other Services       Precautions / Restrictions Precautions Precaution Comments: ventricular drain Restrictions Weight Bearing Restrictions: No      Mobility  Bed Mobility Overal bed mobility: +2 for physical assistance;Needs Assistance Bed Mobility: Supine to Sit;Sit to Supine     Supine to sit: +2 for physical assistance;Total assist Sit to supine: +2 for physical assistance;Total assist   General bed mobility comments: sat EOB x 5 minutes with total assist  Transfers                    Ambulation/Gait                Stairs            Wheelchair Mobility    Modified Rankin (Stroke Patients Only)       Balance Overall balance assessment: Needs assistance   Sitting balance-Leahy Scale: Zero                                       Pertinent Vitals/Pain Pain Assessment: Faces Faces Pain Scale: Hurts a little bit Pain Location: withdraws to pain R foot> L,  grimaces with repositioning. Pain Descriptors / Indicators: Grimacing;Other (Comment) (withdrawal) Pain Intervention(s): Repositioned    Home Living Family/patient expects to be discharged to:: Skilled nursing facility                 Additional Comments: no family available for PLOF    Prior Function                 Hand Dominance        Extremity/Trunk Assessment   Upper Extremity Assessment: Difficult to assess due to impaired cognition;RUE deficits/detail;LUE deficits/detail RUE Deficits / Details: performs spontaneous hand to face and top of head, arthritic changes mild edema     LUE Deficits / Details: performs hand to face, full PROM, arthritic changes in hand, moderate edema   Lower Extremity Assessment: RLE deficits/detail;LLE deficits/detail;Difficult to assess due to impaired cognition RLE Deficits / Details: some active ROM noted during session, unable to follow commands, + arthritic changes noted and PROM performed  (moderate edema noted) LLE Deficits / Details: some active ROM noted during session, unable to follow commands, + arthritic changes noted and PROM performed  (moderate edema noted)     Communication   Communication: Other (comment) (non verbal)  Cognition Arousal/Alertness: Lethargic Behavior During Therapy: Flat affect Overall Cognitive Status: Impaired/Different from  baseline Area of Impairment: Attention;Following commands       Following Commands: Follows one step commands inconsistently;Follows one step commands with increased time (squeezed therapist's hand x 1)       General Comments: opened eyes momentarily after returned to supine    General Comments      Exercises General Exercises - Lower Extremity Ankle Circles/Pumps: PROM;Both Heel Slides: PROM;Both      Assessment/Plan    PT Assessment Patient needs continued PT services  PT Diagnosis Altered mental status   PT Problem List Decreased strength;Decreased  range of motion;Decreased activity tolerance;Decreased balance;Decreased mobility;Decreased coordination;Decreased cognition  PT Treatment Interventions DME instruction;Gait training;Stair training;Functional mobility training;Therapeutic activities;Therapeutic exercise;Balance training;Cognitive remediation;Patient/family education   PT Goals (Current goals can be found in the Care Plan section) Acute Rehab PT Goals Patient Stated Goal: unable to state PT Goal Formulation: Patient unable to participate in goal setting Time For Goal Achievement: 09/18/14 Potential to Achieve Goals: Fair    Frequency Min 2X/week   Barriers to discharge        Co-evaluation PT/OT/SLP Co-Evaluation/Treatment: Yes Reason for Co-Treatment: Complexity of the patient's impairments (multi-system involvement) PT goals addressed during session: Mobility/safety with mobility OT goals addressed during session: Strengthening/ROM       End of Session   Activity Tolerance: Patient limited by fatigue;Patient limited by lethargy Patient left: in bed;with call bell/phone within reach;with bed alarm set;with nursing/sitter in room Nurse Communication: Mobility status;Other (comment) (arousal noted when returned to supine post sitting)         Time: 1610-96040930-0949 PT Time Calculation (min): 19 min   Charges:   PT Evaluation $Initial PT Evaluation Tier I: 1 Procedure PT Treatments $Therapeutic Activity: 8-22 mins   PT G CodesFabio Asa:          Quinzell Malcomb J 09/04/2014, 11:41 AM Charlotte Crumbevon Nirvi Boehler, PT DPT  [phone]

## 2014-09-04 NOTE — Progress Notes (Addendum)
I spoke with patient's family and explained the need for ventricular drain.  They understand that the patient has hydrocephalus and ventriculomegaly.  They understand the risks and benefits and wish for me to proceed.  Ventricular drain set to 10cm H20 after it was placed.  INR was corrected to 1.46 prior to placing drain.  I elected to place drain and patient was given an additional unit FFP at the same time, rather than waiting longer because of progressive obtundation.

## 2014-09-04 NOTE — Evaluation (Signed)
Occupational Therapy Evaluation Patient Details Name: Susan Calhoun MRN: [id number] DOB: 09/02/1928 Today's Date: 09/04/2014    History of Present Illness 80 yo wf admitted on 09/01/14 with MS changes. Initial CT showed some ventriculomegaly. Was being treated for UTI. MS changes worsened and an MRI was performed revelaed IVH, Ventricular drain placed on 09/04/14.   Clinical Impression   Pt presents with lethargy and +2 dependence in all mobility.  She opened her eyes x 1 and followed a simple command x 1 after having sat EOB for several minutes. She withdraws to pain stimulation and grimaces with repositioning in bed.  She appears to perform purposeful hand to nose movement to scratch. No family available to provide PLOF specifics, but pt lived alone per chart.  Will follow acutely.      Follow Up Recommendations  SNF;Supervision/Assistance - 24 hour    Equipment Recommendations       Recommendations for Other Services       Precautions / Restrictions Precautions Precaution Comments: ventricular drain Restrictions Weight Bearing Restrictions: No      Mobility Bed Mobility Overal bed mobility: +2 for physical assistance;Needs Assistance Bed Mobility: Supine to Sit;Sit to Supine     Supine to sit: +2 for physical assistance;Total assist Sit to supine: +2 for physical assistance;Total assist   General bed mobility comments: sat EOB x 5 minutes with total assist  Transfers                      Balance Overall balance assessment: Needs assistance   Sitting balance-Leahy Scale: Zero                                      ADL Overall ADL's : Needs assistance/impaired                                       General ADL Comments: Total assist for all ADL.     Vision                     Perception     Praxis      Pertinent Vitals/Pain Pain Assessment: Faces Faces Pain Scale: Hurts a little bit Pain Location:  withdraws to pain R foot> L, grimaces with repositioning. Pain Descriptors / Indicators: Grimacing;Other (Comment) (withdrawal) Pain Intervention(s): Repositioned     Hand Dominance     Extremity/Trunk Assessment Upper Extremity Assessment Upper Extremity Assessment: Difficult to assess due to impaired cognition;RUE deficits/detail;LUE deficits/detail RUE Deficits / Details: performs spontaneous hand to face and top of head, arthritic changes mild edema RUE Coordination: decreased fine motor;decreased gross motor LUE Deficits / Details: performs hand to face, full PROM, arthritic changes in hand, moderate edema LUE Coordination: decreased fine motor;decreased gross motor   Lower Extremity Assessment Lower Extremity Assessment: Defer to PT evaluation       Communication Communication Communication: Other (comment) (non verbal)   Cognition Arousal/Alertness: Lethargic Behavior During Therapy: Flat affect Overall Cognitive Status: Impaired/Different from baseline Area of Impairment: Attention;Following commands       Following Commands: Follows one step commands inconsistently;Follows one step commands with increased time (squeezed therapist's hand x 1)       General Comments: opened eyes momentarily after returned to supine   General Comments  Exercises Exercises: General Upper Extremity     Shoulder Instructions      Home Living Family/patient expects to be discharged to:: Skilled nursing facility                                 Additional Comments: no family available for PLOF      Prior Functioning/Environment               OT Diagnosis: Generalized weakness;Cognitive deficits   OT Problem List: Decreased strength;Decreased activity tolerance;Impaired balance (sitting and/or standing);Decreased cognition;Impaired UE functional use   OT Treatment/Interventions: Therapeutic exercise;Therapeutic activities;Cognitive  remediation/compensation;Patient/family education;Balance training    OT Goals(Current goals can be found in the care plan section) Acute Rehab OT Goals OT Goal Formulation: Patient unable to participate in goal setting Time For Goal Achievement: 09/18/14 Potential to Achieve Goals: Fair ADL Goals Additional ADL Goal #1: Pt will follow one step commands 25% of time. Additional ADL Goal #2: Pt will participate in therapeutic activities with her eyes open x 5 minutes. Additional ADL Goal #3: Pt will sit EOB with moderate assistance x 5 minutes as precursor to ADL. Additional ADL Goal #4: Pt will demonstrate purposeful UE use by performing hand to face with washcloth, tissue, mouth swab.  OT Frequency: Min 2X/week   Barriers to D/C:            Co-evaluation PT/OT/SLP Co-Evaluation/Treatment: Yes Reason for Co-Treatment: Complexity of the patient's impairments (multi-system involvement)   OT goals addressed during session: Strengthening/ROM      End of Session Nurse Communication:  (activity tolerance, opened eyes, followed command x 1)  Activity Tolerance: Patient limited by lethargy Patient left: in bed;with call bell/phone within reach;with nursing/sitter in room;with restraints reapplied   Time: [id number] OT Time Calculation (min): 32 min Charges:  OT General Charges $OT Visit: 1 Procedure OT Evaluation $Initial OT Evaluation Tier I: 1 Procedure OT Treatments $Therapeutic Exercise: 8-22 mins G-Codes:    Evern BioMayberry, Damilola Flamm Lynn 09/04/2014, 10:14 AM [phone]

## 2014-09-04 NOTE — Progress Notes (Signed)
CRITICAL VALUE ALERT  Critical value received: CSF WBC Date of notification:  ime of notification:  09/04/14 0234 HRS  Critical value read back:YES  Nurse who received alert: Rockwell AlexandriaMark Jayro Mcmath, RN MD notified :  DR Simmonds(Elink)  Time notified:  [phone]

## 2014-09-04 NOTE — Procedures (Signed)
Central Venous Catheter Insertion Procedure Note Jamesetta SoGiuseppina Stagliano [id number] 09/13/1928  Procedure: Insertion of Central Venous Catheter Indications: Assessment of intravascular volume and Drug and/or fluid administration  Procedure Details Consent: Risks of procedure as well as the alternatives and risks of each were explained to the (patient/caregiver).  Consent for procedure obtained. Time Out: Verified patient identification, verified procedure, site/side was marked, verified correct patient position, special equipment/implants available, medications/allergies/relevent history reviewed, required imaging and test results available.  Performed  Maximum sterile technique was used including antiseptics, cap, gloves, gown, hand hygiene, mask and sheet. Skin prep: Chlorhexidine; local anesthetic administered A antimicrobial bonded/coated triple lumen catheter was placed in the left internal jugular vein using the Seldinger technique.  Evaluation Blood flow good Complications: No apparent complications Patient did tolerate procedure well. Chest X-ray ordered to verify placement.  CXR: pending.  Procedure performed under direct ultrasound guidance for real time vessel cannulation.     Ventricular drain was manipulated and turned to off prior to head of bed being lowered and procedure being started.  Emokpae, Ejiroghene 09/04/2014, 3:34 PM

## 2014-09-04 NOTE — Progress Notes (Signed)
Patient drunk 2 sips of water from cup with straw after much coercion. Within a minute of swallowing (no cough or difficulty) patient retched and vomited < 30 ml of bilious emesis and nodded yes that she was nauseated. Paged NP new orders received.

## 2014-09-05 DIAGNOSIS — R40244 Other coma, without documented Glasgow coma scale score, or with partial score reported: Secondary | ICD-10-CM

## 2014-09-05 LAB — MAGNESIUM: MAGNESIUM: 2 mg/dL (ref 1.5–2.5)

## 2014-09-05 LAB — CBC
HCT: 36.3 % (ref 36.0–46.0)
Hemoglobin: 11.7 g/dL — ABNORMAL LOW (ref 12.0–15.0)
MCH: 29.5 pg (ref 26.0–34.0)
MCHC: 32.2 g/dL (ref 30.0–36.0)
MCV: 91.7 fL (ref 78.0–100.0)
Platelets: 171 10*3/uL (ref 150–400)
RBC: 3.96 MIL/uL (ref 3.87–5.11)
RDW: 17.6 % — AB (ref 11.5–15.5)
WBC: 11.6 10*3/uL — ABNORMAL HIGH (ref 4.0–10.5)

## 2014-09-05 LAB — BASIC METABOLIC PANEL
Anion gap: 13 (ref 5–15)
BUN: 31 mg/dL — ABNORMAL HIGH (ref 6–23)
CO2: 23 mEq/L (ref 19–32)
Calcium: 8.4 mg/dL (ref 8.4–10.5)
Chloride: 113 mEq/L — ABNORMAL HIGH (ref 96–112)
Creatinine, Ser: 1.21 mg/dL — ABNORMAL HIGH (ref 0.50–1.10)
GFR calc non Af Amer: 40 mL/min — ABNORMAL LOW (ref >90)
GFR, EST AFRICAN AMERICAN: 46 mL/min — AB (ref >90)
Glucose, Bld: 152 mg/dL — ABNORMAL HIGH (ref 70–99)
POTASSIUM: 3.3 meq/L — AB (ref 3.7–5.3)
SODIUM: 149 meq/L — AB (ref 137–147)

## 2014-09-05 LAB — GLUCOSE, CAPILLARY
GLUCOSE-CAPILLARY: 142 mg/dL — AB (ref 70–99)
GLUCOSE-CAPILLARY: 147 mg/dL — AB (ref 70–99)
Glucose-Capillary: 125 mg/dL — ABNORMAL HIGH (ref 70–99)
Glucose-Capillary: 148 mg/dL — ABNORMAL HIGH (ref 70–99)
Glucose-Capillary: 162 mg/dL — ABNORMAL HIGH (ref 70–99)
Glucose-Capillary: 160 mg/dL — ABNORMAL HIGH (ref 70–99)

## 2014-09-05 LAB — PROTIME-INR
INR: 1.11 (ref 0.00–1.49)
PROTHROMBIN TIME: 14.4 s (ref 11.6–15.2)

## 2014-09-05 LAB — PATHOLOGIST SMEAR REVIEW

## 2014-09-05 LAB — PHOSPHORUS: PHOSPHORUS: 2.6 mg/dL (ref 2.3–4.6)

## 2014-09-05 MED ORDER — METOPROLOL TARTRATE 1 MG/ML IV SOLN
5.0000 mg | Freq: Four times a day (QID) | INTRAVENOUS | Status: DC
  Administered 2014-09-05: 5 mg via INTRAVENOUS
  Filled 2014-09-05 (×4): qty 5

## 2014-09-05 MED ORDER — METOPROLOL TARTRATE 1 MG/ML IV SOLN
2.5000 mg | INTRAVENOUS | Status: DC | PRN
  Administered 2014-09-05 – 2014-09-07 (×6): 5 mg via INTRAVENOUS
  Administered 2014-09-12: 2.5 mg via INTRAVENOUS
  Administered 2014-09-13 – 2014-09-20 (×12): 5 mg via INTRAVENOUS
  Filled 2014-09-05 (×18): qty 5

## 2014-09-05 MED ORDER — HYDRALAZINE HCL 20 MG/ML IJ SOLN
10.0000 mg | INTRAMUSCULAR | Status: DC | PRN
  Administered 2014-09-05: 40 mg via INTRAVENOUS
  Administered 2014-09-06: 20 mg via INTRAVENOUS
  Administered 2014-09-06: 40 mg via INTRAVENOUS
  Administered 2014-09-13: 20 mg via INTRAVENOUS
  Filled 2014-09-05 (×2): qty 1
  Filled 2014-09-05 (×2): qty 2
  Filled 2014-09-05: qty 1

## 2014-09-05 MED ORDER — METOPROLOL TARTRATE 25 MG/10 ML ORAL SUSPENSION
25.0000 mg | Freq: Two times a day (BID) | ORAL | Status: DC
  Administered 2014-09-05 – 2014-09-15 (×22): 25 mg
  Filled 2014-09-05 (×24): qty 10

## 2014-09-05 NOTE — Progress Notes (Signed)
eLink Physician-Brief Progress Note Patient Name: Susan Calhoun DOB: 07/14/1928 MRN: [id number]   Date of Service  09/05/2014  HPI/Events of Note  Hypertension, tachycardia  eICU Interventions  -PRN metoprolol to maintain HR < 115/min -PRN hydralazine to maintain SBp < 170 mmHg -BP goal clarified - does not need to be < 150 mmHg as specified by previous order     Intervention Category Major Interventions: Arrhythmia - evaluation and management;Hypertension - evaluation and management  Billy FischerDavid Alvaretta Eisenberger 09/05/2014, 1:10 AM

## 2014-09-05 NOTE — Progress Notes (Signed)
eLink Physician-Brief Progress Note Patient Name: Susan Calhoun DOB: 06/15/1928 MRN: [id number]   Date of Service  09/05/2014  HPI/Events of Note  There is no Neuro RN available to float to 76M  eICU Interventions  Transfer to 15M ordered     Intervention Category Evaluation Type: Other  Billy FischerDavid Arjuna Doeden 09/05/2014, 4:28 AM

## 2014-09-05 NOTE — Procedures (Signed)
Tolerated and supervices in full US Acces needed  Mcarthur RossettiDaniel J. Tyson AliasFeinstein, MD, FACP Pgr: [phone] [facility]

## 2014-09-05 NOTE — Progress Notes (Signed)
UR completed.  Wynonna Fitzhenry, RN BSN MHA CCM Trauma/Neuro ICU Case Manager [phone]  

## 2014-09-05 NOTE — Progress Notes (Signed)
Patient ID: Susan Calhoun, female   DOB: 01/10/1928, 80 y.o.   MRN: [id number] Opens eyes today, but still no speech, just moans, localizes but no FC. Ventric patent under low pressure. CT essentially unchanged.

## 2014-09-05 NOTE — Consult Note (Signed)
PULMONARY / CRITICAL CARE MEDICINE - consult   Name: Susan Calhoun MRN: [id number] DOB: 07/26/1928    ADMISSION DATE:  09/01/2014 CONSULTATION DATE:  10/14   REFERRING MD :  Elisabeth Pigeonevine   CHIEF COMPLAINT:  Acute encephalopathy   INITIAL PRESENTATION:   80 y.o. female with PMH of dm, AF (on rate cntl and coumadin). Admitted 09/01/14 with working dx of UTI and acute encephalopathy. CT head Patient was started on empiric Levaquin for possible urinary tract infection. CT head was negative on admit. Had no improvement so MRI obtained on 10/14 showing:  worsening hydrocephalus and layering material in the occipital horns of the lateral ventricles w/ question of hemorrhage raising concern for meningitis. [facility]   STUDIES:  CT head 10/14: Mild, likely age-appropriate, centralized volume loss without acute intracranial process MR brain 10/14: Acute hydrocephalus, worsened since 3 days ago. Layering material in the occipital horns of lateral ventricles, at least partly hemorrhagic. Question focal thrombus in the atrium of the right lateral ventricle, possibly associated with the choroid. This could  be the site of hemorrhage. No specific vascular lesion is identified. No intraparenchymal hemorrhage. The differential diagnosis does include meningitis.   SIGNIFICANT EVENTS: 10/12: [facility]  10/14: No improvement. MR brain worrisome for worsening hydrocephalus and possibly meningitis. [facility]   10/14- coags corrected and drain IVC placed, high pressure noted  SUBJECTIVE:  S/p Right frontal IVC placement, following commands but lethargic.  Protecting airway.  VITAL SIGNS: Temp:  [98.3 F (36.8 C)-100.5 F (38.1 C)] 100.5 F (38.1 C) (10/16 0800) Pulse Rate:  [64-131] 125 (10/16 0900) Resp:  [14-36] 21 (10/16 0900) BP: (85-197)/(38-149) 154/56 mmHg (10/16 0900) SpO2:  [93 %-100 %] 94 % (10/16 0900) Weight:  [55.3 kg (121 lb  14.6 oz)] 55.3 kg (121 lb 14.6 oz) (10/16 0445) HEMODYNAMICS:   VENTILATOR SETTINGS:   INTAKE / OUTPUT:  Intake/Output Summary (Last 24 hours) at 09/05/14 0947 Last data filed at 09/05/14 0900  Gross per 24 hour  Intake 2292.35 ml  Output   1589 ml  Net 703.35 ml   PHYSICAL EXAMINATION: General: Lethargic, to sternal rub will awaken, moves upper ext Neuro: Left eye, rt 1 mm sluggish, drain in place HEENT: jvd low, some rigidity Cardiovascular:  s1s2 RRR Lungs:  CTA, no ronchi Abdomen:  Soft, BS wnl, no r/g Musculoskeletal:  No edema Skin:  Left buttox small cluster nondermatomal pustules -not changed  LABS:  CBC  Recent Labs Lab 09/02/14 0530 09/04/14 0243 09/05/14 0545  WBC 11.1* 11.4* 11.6*  HGB 12.5 10.6* 11.7*  HCT 39.4 31.9* 36.3  PLT 237 169 171   Coag's  Recent Labs Lab 09/03/14 2245 09/04/14 0243 09/05/14 0545  APTT 30  --   --   INR 1.46 1.22 1.11   BMET  Recent Labs Lab 09/02/14 0530 09/04/14 0243 09/05/14 0545  NA 144 147 149*  K 3.8 3.2* 3.3*  CL 108 111 113*  CO2 20 22 23   BUN 24* 35* 31*  CREATININE 1.08 1.27* 1.21*  GLUCOSE 149* 152* 152*   Electrolytes  Recent Labs Lab 09/02/14 0530 09/04/14 0243 09/05/14 0545  CALCIUM 8.6 8.4 8.4  MG  --   --  2.0  PHOS  --   --  2.6   Sepsis Markers  Recent Labs Lab 08/31/14 2119 09/01/14 1657  LATICACIDVEN 0.70 0.74   ABG  Recent Labs Lab 09/03/14 1738  PHART 7.465*  PCO2ART  27.3*  PO2ART 90.8   Liver Enzymes  Recent Labs Lab 08/31/14 1807 09/01/14 1648 09/02/14 0530  AST 18 22 19   ALT 16 17 15   ALKPHOS 73 81 71  BILITOT 0.3 0.5 0.4  ALBUMIN 3.5 3.5 2.9*   Cardiac Enzymes  Recent Labs Lab 09/01/14 1648  TROPONINI <0.30   Glucose  Recent Labs Lab 09/04/14 1202 09/04/14 1628 09/04/14 1953 09/04/14 2324 09/05/14 0443 09/05/14 0742  GLUCAP 100* 107* 103* 125* 142* 148*    Imaging Ct Head Wo Contrast  09/05/2014   CLINICAL DATA:  Follow-up  examination for hydrocephalus.  EXAM: CT HEAD WITHOUT CONTRAST  TECHNIQUE: Contiguous axial images were obtained from the base of the skull through the vertex without intravenous contrast.  COMPARISON:  Prior MRI from 09/03/2014 and CT from 08/31/2014  FINDINGS: There has been interval placement of a right frontal approach ventriculostomy catheter with tip in the third ventricle. The ventricles remain dilated, however, overall ventricular size is slightly decreased relative to prior study. There is layering hemorrhage within the occipital horns of both lateral ventricles, grossly similar to previous. No definite new intracranial hemorrhage. Faint linear hyperdensity at the right sylvian fissure may represent a small amount of subarachnoid hemorrhage (series 4, image 8).  No acute large vessel territory infarct. No midline shift. Basilar cisterns remain patent. No extra-axial fluid collection.  Calvarium intact. Scalp soft tissues within normal limits. No acute abnormality seen about either orbit.  Nasogastric tube partially visualized. There is mild opacity within the sphenoid sinuses bilaterally with scattered mucoperiosteal thickening within the maxillary sinuses. No mastoid effusion.  IMPRESSION: 1. Interval placement of right frontal approach ventriculostomy catheter with tip in the third ventricle. Ventricles remain dilated but are slightly decreased in size relative to previous MRI from 09/03/2014. 2. Layering hemorrhage within the occipital horns of the lateral ventricles bilaterally, stable from prior. 3. Question trace acute subarachnoid hemorrhage within the right sylvian fissure.   Electronically Signed   By: Rise Mu M.D.   On: 09/05/2014 00:35   Dg Chest Port 1 View  09/04/2014   CLINICAL DATA:  Central line placement. Atrial fibrillation. Coronary artery disease.  EXAM: PORTABLE CHEST - 1 VIEW  COMPARISON:  08/31/2014  FINDINGS: A new left internal jugular central venous catheter is  seen with tip in the distal SVC. No evidence of pneumothorax.  Cardiomegaly is stable. Both lungs remain clear. No evidence of pleural effusion.  IMPRESSION: New left jugular central venous catheter tip overlies the distal SVC. No evidence of pneumothorax.  Stable cardiomegaly.  No active lung disease.   Electronically Signed   By: Myles Rosenthal M.D.   On: 09/04/2014 16:35   Dg Abd Portable 1v  09/04/2014   CLINICAL DATA:  Feeding tube placement.  EXAM: PORTABLE ABDOMEN - 1 VIEW  COMPARISON:  None.  FINDINGS: The feeding tube tip projects in the mid stomach. Included bowel gas pattern is nondilated and nonobstructive. Limited view of the lung bases are clear. Moderate degenerative changes of the spine.  IMPRESSION: Feeding tube tip projects in mid stomach.   Electronically Signed   By: Awilda Metro   On: 09/04/2014 19:54   ASSESSMENT / PLAN:  PULMONARY OETT A: Air way protection ok for now P:   Pulse ox  O2 for sats > 92%  Low threshold ETT No further abg reviewed  CARDIOVASCULAR A:  Chronic Afib now in RVR For CPP management P:  Lopressor 25 mg PO BID Amiodarone PO Tele Goal MAP  80  RENAL A: ARF, ATN, hypoK P:   Keep euvolemic to possible Replace electrolytes as indicated Trend chemistry in am  Acyclovir d/ced NA goal 150-154 ok , until ct for edema re evaluated No free water  GASTROINTESTINAL A:  NPO P:   Protonix TF per neuro  HEMATOLOGIC A:   Coumadin induced coagulapthy- resolved P:  INR 1.22 SCDs  INFECTIOUS A:   UTI  No evidence meningitis Unlikely encephalitis  Minimal suspicion for bacterial component P:   BCx2 10/12>>> UC 10/12: neg  CSF with WBC 168, neutros 85, lymphs 7 ' CSF cx >>  Abx: CTX, start date 10/14, d/c ABX vanc start date 10/14 d/c abx ampicillin date 10/14 d/c Acyclovir start date 10/14>>>10/15  LP or drain with analysis Unroof vesicles to send? tzank smear I do NOT think we need isolation further  ENDOCRINE A:   DM    P:   SSI protocol   NEUROLOGIC A:   Hydrocephalus s/p ventricular drain ?viral meningitis (No leptomeningela enhancement)  IV bleed as primary event? P:   RASS goal: n/a Neuro checks q 2 Supportive care   TODAY'S SUMMARY: CT of the head noted, off of all abx at this time, low threshold to cover for aspiration.  Full code.  If respiratory condition deteriorates then low threshold for intubation.  Ccm time 35 min  Alyson ReedyWesam G. Yacoub, M.D. [facility]. Pager: [phone]. After hours pager: [phone].

## 2014-09-06 ENCOUNTER — Inpatient Hospital Stay (HOSPITAL_COMMUNITY): Payer: Medicare (Managed Care)

## 2014-09-06 LAB — BASIC METABOLIC PANEL
Anion gap: 10 (ref 5–15)
BUN: 40 mg/dL — AB (ref 6–23)
CALCIUM: 7.5 mg/dL — AB (ref 8.4–10.5)
CO2: 23 mEq/L (ref 19–32)
Chloride: 117 mEq/L — ABNORMAL HIGH (ref 96–112)
Creatinine, Ser: 1.28 mg/dL — ABNORMAL HIGH (ref 0.50–1.10)
GFR, EST AFRICAN AMERICAN: 43 mL/min — AB (ref >90)
GFR, EST NON AFRICAN AMERICAN: 37 mL/min — AB (ref >90)
GLUCOSE: 164 mg/dL — AB (ref 70–99)
POTASSIUM: 3.1 meq/L — AB (ref 3.7–5.3)
Sodium: 150 mEq/L — ABNORMAL HIGH (ref 137–147)

## 2014-09-06 LAB — GLUCOSE, CAPILLARY
GLUCOSE-CAPILLARY: 144 mg/dL — AB (ref 70–99)
GLUCOSE-CAPILLARY: 172 mg/dL — AB (ref 70–99)
Glucose-Capillary: 169 mg/dL — ABNORMAL HIGH (ref 70–99)
Glucose-Capillary: 163 mg/dL — ABNORMAL HIGH (ref 70–99)
Glucose-Capillary: 172 mg/dL — ABNORMAL HIGH (ref 70–99)
Glucose-Capillary: 161 mg/dL — ABNORMAL HIGH (ref 70–99)

## 2014-09-06 LAB — MAGNESIUM: Magnesium: 2 mg/dL (ref 1.5–2.5)

## 2014-09-06 LAB — CBC
HEMATOCRIT: 30.6 % — AB (ref 36.0–46.0)
Hemoglobin: 9.9 g/dL — ABNORMAL LOW (ref 12.0–15.0)
MCH: 29.6 pg (ref 26.0–34.0)
MCHC: 32.4 g/dL (ref 30.0–36.0)
MCV: 91.3 fL (ref 78.0–100.0)
Platelets: 149 10*3/uL — ABNORMAL LOW (ref 150–400)
RBC: 3.35 MIL/uL — ABNORMAL LOW (ref 3.87–5.11)
RDW: 17.5 % — AB (ref 11.5–15.5)
WBC: 10.8 10*3/uL — ABNORMAL HIGH (ref 4.0–10.5)

## 2014-09-06 LAB — PHOSPHORUS: PHOSPHORUS: 2.4 mg/dL (ref 2.3–4.6)

## 2014-09-06 LAB — PROTIME-INR
INR: 1.11 (ref 0.00–1.49)
Prothrombin Time: 14.4 seconds (ref 11.6–15.2)

## 2014-09-06 LAB — CLOSTRIDIUM DIFFICILE BY PCR: Toxigenic C. Difficile by PCR: NEGATIVE

## 2014-09-06 MED ORDER — FREE WATER
200.0000 mL | Freq: Three times a day (TID) | Status: DC
  Administered 2014-09-06 – 2014-09-14 (×24): 200 mL

## 2014-09-06 MED ORDER — POTASSIUM CHLORIDE 20 MEQ/15ML (10%) PO LIQD
30.0000 meq | ORAL | Status: AC
  Administered 2014-09-06 (×2): 30 meq
  Filled 2014-09-06 (×2): qty 30

## 2014-09-06 MED ORDER — ATORVASTATIN CALCIUM 80 MG PO TABS
80.0000 mg | ORAL_TABLET | Freq: Every day | ORAL | Status: DC
  Administered 2014-09-06 – 2014-09-13 (×8): 80 mg
  Filled 2014-09-06 (×9): qty 1

## 2014-09-06 MED ORDER — AMIODARONE HCL 200 MG PO TABS
200.0000 mg | ORAL_TABLET | Freq: Every day | ORAL | Status: DC
  Administered 2014-09-07 – 2014-09-30 (×24): 200 mg
  Filled 2014-09-06 (×24): qty 1

## 2014-09-06 MED ORDER — INSULIN ASPART 100 UNIT/ML ~~LOC~~ SOLN
0.0000 [IU] | SUBCUTANEOUS | Status: DC
  Administered 2014-09-06 – 2014-09-08 (×11): 3 [IU] via SUBCUTANEOUS
  Administered 2014-09-08: 2 [IU] via SUBCUTANEOUS
  Administered 2014-09-08: 3 [IU] via SUBCUTANEOUS
  Administered 2014-09-08: 2 [IU] via SUBCUTANEOUS
  Administered 2014-09-08 (×2): 3 [IU] via SUBCUTANEOUS
  Administered 2014-09-09: 2 [IU] via SUBCUTANEOUS
  Administered 2014-09-09: 3 [IU] via SUBCUTANEOUS
  Administered 2014-09-09: 2 [IU] via SUBCUTANEOUS
  Administered 2014-09-09 (×2): 3 [IU] via SUBCUTANEOUS
  Administered 2014-09-10 (×3): 2 [IU] via SUBCUTANEOUS
  Administered 2014-09-10 (×2): 3 [IU] via SUBCUTANEOUS
  Administered 2014-09-11: 2 [IU] via SUBCUTANEOUS
  Administered 2014-09-11 (×3): 3 [IU] via SUBCUTANEOUS
  Administered 2014-09-11 (×2): 2 [IU] via SUBCUTANEOUS
  Administered 2014-09-12: 3 [IU] via SUBCUTANEOUS
  Administered 2014-09-12: 5 [IU] via SUBCUTANEOUS
  Administered 2014-09-12 – 2014-09-13 (×5): 3 [IU] via SUBCUTANEOUS
  Administered 2014-09-13: 5 [IU] via SUBCUTANEOUS
  Administered 2014-09-13 (×3): 3 [IU] via SUBCUTANEOUS
  Administered 2014-09-14 – 2014-09-15 (×9): 2 [IU] via SUBCUTANEOUS
  Administered 2014-09-15: 3 [IU] via SUBCUTANEOUS
  Administered 2014-09-15 (×2): 2 [IU] via SUBCUTANEOUS
  Administered 2014-09-16: 3 [IU] via SUBCUTANEOUS

## 2014-09-06 NOTE — Progress Notes (Signed)
No significant change in status. No eye opening to noxious stimuli. Purposeful with both extremities to noxious stimuli.  Currently afebrile. Vitals are stable. Ventriculostomy functioning draining approximately 10 cc an hour.  Overall unchanged. Continue supportive efforts. Continue external ventricular drainage. Source of encephalopathy/hemorrhage remains unclear.

## 2014-09-06 NOTE — Progress Notes (Signed)
PULMONARY / CRITICAL CARE MEDICINE - consult   Name: Susan Calhoun MRN: [id number] DOB: 11/24/1927    ADMISSION DATE:  09/01/2014 CONSULTATION DATE:  10/14   REFERRING MD :  Susan Calhoun   CHIEF COMPLAINT:  Acute encephalopathy   INITIAL PRESENTATION:   80 y.o. female with PMH of dm, AF (on rate cntl and coumadin). Admitted 09/01/14 with working dx of UTI and acute encephalopathy. CT head Patient was started on empiric Levaquin for possible urinary tract infection. CT head was negative on admit. Had no improvement so MRI obtained on 10/14 showing:  worsening hydrocephalus and layering material in the occipital horns of the lateral ventricles w/ question of hemorrhage raising concern for meningitis. [facility]   STUDIES:  CT head 10/14: Mild, likely age-appropriate, centralized volume loss without acute intracranial process MR brain 10/14: Acute hydrocephalus, worsened since 3 days ago. Layering material in the occipital horns of lateral ventricles, at least partly hemorrhagic. Question focal thrombus in the atrium of the right lateral ventricle, possibly associated with the choroid. This could  be the site of hemorrhage. No specific vascular lesion is identified. No intraparenchymal hemorrhage. The differential diagnosis does include meningitis.   SIGNIFICANT EVENTS: 10/12: [facility]  10/14: No improvement. MR brain worrisome for worsening hydrocephalus and possibly meningitis. [facility]   10/14- coags corrected and drain IVC placed, high pressure noted 10/15 R frontal IVC placement Susan Maxon(Stern)  SUBJECTIVE:    VITAL SIGNS: Temp:  [98.3 F (36.8 C)-101.3 F (38.5 C)] 99 F (37.2 C) (10/17 1935) Pulse Rate:  [52-128] 67 (10/17 1900) Resp:  [18-32] 21 (10/17 1900) BP: (71-195)/(34-99) 144/71 mmHg (10/17 1900) SpO2:  [94 %-100 %] 98 % (10/17 1900) Weight:  [64.6 kg (142 lb 6.7 oz)] 64.6 kg (142 lb 6.7 oz) (10/17  0500) HEMODYNAMICS:   VENTILATOR SETTINGS:   INTAKE / OUTPUT:  Intake/Output Summary (Last 24 hours) at 09/06/14 2125 Last data filed at 09/06/14 1900  Gross per 24 hour  Intake   2390 ml  Output   1464 ml  Net    926 ml   PHYSICAL EXAMINATION: General: RASS -1, + F/C Neuro: MAEs HEENT: IVC drain in place Cardiovascular: IR, no M Lungs:  Clear Abdomen:  Soft, BS wnl, no r/g Ext:  No edema  LABS: I have reviewed all of today's lab results. Relevant abnormalities are discussed in the A/P section  CBC  Recent Labs Lab 09/04/14 0243 09/05/14 0545 09/06/14 0500  WBC 11.4* 11.6* 10.8*  HGB 10.6* 11.7* 9.9*  HCT 31.9* 36.3 30.6*  PLT 169 171 149*   Coag's  Recent Labs Lab 09/03/14 2245 09/04/14 0243 09/05/14 0545 09/06/14 0500  APTT 30  --   --   --   INR 1.46 1.22 1.11 1.11   BMET  Recent Labs Lab 09/04/14 0243 09/05/14 0545 09/06/14 0500  NA 147 149* 150*  K 3.2* 3.3* 3.1*  CL 111 113* 117*  CO2 22 23 23   BUN 35* 31* 40*  CREATININE 1.27* 1.21* 1.28*  GLUCOSE 152* 152* 164*   Electrolytes  Recent Labs Lab 09/04/14 0243 09/05/14 0545 09/06/14 0500  CALCIUM 8.4 8.4 7.5*  MG  --  2.0 2.0  PHOS  --  2.6 2.4   Sepsis Markers  Recent Labs Lab 08/31/14 2119 09/01/14 1657  LATICACIDVEN 0.70 0.74   ABG  Recent Labs Lab 09/03/14 1738  PHART 7.465*  PCO2ART 27.3*  PO2ART 90.8   Liver  Enzymes  Recent Labs Lab 08/31/14 1807 09/01/14 1648 09/02/14 0530  AST 18 22 19   ALT 16 17 15   ALKPHOS 73 81 71  BILITOT 0.3 0.5 0.4  ALBUMIN 3.5 3.5 2.9*   Cardiac Enzymes  Recent Labs Lab 09/01/14 1648  TROPONINI <0.30   Glucose  Recent Labs Lab 09/05/14 2302 09/06/14 0314 09/06/14 0750 09/06/14 1143 09/06/14 1558 09/06/14 1933  GLUCAP 169* 163* 144* 172* 172* 161*    Imaging No results found. ASSESSMENT / PLAN:  PULMONARY OETT A: Air way protection ok for now P:   Supp O2 Monitor in ICU  CARDIOVASCULAR A:  AF,  CVR P:  Cont current Rx  RENAL A:  Hypernatremia Hypokalemia P:   Monitor BMET intermittently Monitor I/Os Correct electrolytes as indicated  GASTROINTESTINAL A:   Dysphagia P:   SUP: N/I Cont TFs  HEMATOLOGIC A:   Coumadin induced coagulapthy- resolved P:  DVT px: SQ heparin Monitor CBC intermittently Transfuse per usual ICU guidelines  INFECTIOUS A:   UTI  No evidence meningitis Unlikely encephalitis  Minimal suspicion for bacterial component P:   BCx2 10/12>> NEG UC 10/12: neg  CSF with WBC 168, neutros 85, lymphs 7 ' CSF cx >> NEG  Abx: CTX, start date 10/14, d/c ABX vanc start date 10/14 d/c abx ampicillin date 10/14 d/c Acyclovir start date 10/14>>>10/15  ENDOCRINE A:   DM  2 P:   Cont SSI  NEUROLOGIC A:   Hydrocephalus s/p ventricular drain P:   Further eval and mgmt per neuro and NS  TODAY'S SUMMARY:     .Susan Fischeravid Simonds, MD ; [facility] Mobile [phone].  After 5:30 PM or weekends, call [phone]

## 2014-09-07 DIAGNOSIS — E119 Type 2 diabetes mellitus without complications: Secondary | ICD-10-CM

## 2014-09-07 LAB — BASIC METABOLIC PANEL
Anion gap: 12 (ref 5–15)
BUN: 42 mg/dL — AB (ref 6–23)
CALCIUM: 7.7 mg/dL — AB (ref 8.4–10.5)
CO2: 19 mEq/L (ref 19–32)
Chloride: 118 mEq/L — ABNORMAL HIGH (ref 96–112)
Creatinine, Ser: 1.33 mg/dL — ABNORMAL HIGH (ref 0.50–1.10)
GFR calc Af Amer: 41 mL/min — ABNORMAL LOW (ref >90)
GFR calc non Af Amer: 36 mL/min — ABNORMAL LOW (ref >90)
GLUCOSE: 156 mg/dL — AB (ref 70–99)
Potassium: 4.2 mEq/L (ref 3.7–5.3)
Sodium: 149 mEq/L — ABNORMAL HIGH (ref 137–147)

## 2014-09-07 LAB — CSF CULTURE: Culture: NO GROWTH

## 2014-09-07 LAB — GLUCOSE, CAPILLARY
GLUCOSE-CAPILLARY: 163 mg/dL — AB (ref 70–99)
GLUCOSE-CAPILLARY: 170 mg/dL — AB (ref 70–99)
GLUCOSE-CAPILLARY: 197 mg/dL — AB (ref 70–99)
Glucose-Capillary: 166 mg/dL — ABNORMAL HIGH (ref 70–99)
Glucose-Capillary: 162 mg/dL — ABNORMAL HIGH (ref 70–99)
Glucose-Capillary: 154 mg/dL — ABNORMAL HIGH (ref 70–99)

## 2014-09-07 LAB — CBC
HEMATOCRIT: 33.6 % — AB (ref 36.0–46.0)
Hemoglobin: 10.8 g/dL — ABNORMAL LOW (ref 12.0–15.0)
MCH: 30.3 pg (ref 26.0–34.0)
MCHC: 32.1 g/dL (ref 30.0–36.0)
MCV: 94.1 fL (ref 78.0–100.0)
PLATELETS: 143 10*3/uL — AB (ref 150–400)
RBC: 3.57 MIL/uL — AB (ref 3.87–5.11)
RDW: 17.7 % — AB (ref 11.5–15.5)
WBC: 12.2 10*3/uL — ABNORMAL HIGH (ref 4.0–10.5)

## 2014-09-07 LAB — CSF CULTURE W GRAM STAIN

## 2014-09-07 MED ORDER — LOPERAMIDE HCL 1 MG/5ML PO LIQD
4.0000 mg | ORAL | Status: DC | PRN
  Administered 2014-09-07 – 2014-09-16 (×5): 4 mg
  Filled 2014-09-07 (×6): qty 20

## 2014-09-07 NOTE — Progress Notes (Signed)
PULMONARY / CRITICAL CARE MEDICINE - consult   Name: Susan Calhoun MRN: [id number] DOB: 07/10/1928    ADMISSION DATE:  09/01/2014 CONSULTATION DATE:  10/14   REFERRING MD :  Elisabeth Pigeonevine   CHIEF COMPLAINT:  Acute encephalopathy   INITIAL PRESENTATION:   80 y.o. female with PMH of dm, AF (on rate cntl and coumadin). Admitted 09/01/14 with working dx of UTI and acute encephalopathy. CT head Patient was started on empiric Levaquin for possible urinary tract infection. CT head was negative on admit. Had no improvement so MRI obtained on 10/14 showing:  worsening hydrocephalus and layering material in the occipital horns of the lateral ventricles w/ question of hemorrhage raising concern for meningitis. [facility].   STUDIES:  CT head 10/14: Mild, likely age-appropriate, centralized volume loss without acute intracranial process MR brain 10/14: Acute hydrocephalus, worsened since 3 days ago. Layering material in the occipital horns of lateral ventricles, at least partly hemorrhagic. Question focal thrombus in the atrium of the right lateral ventricle, possibly associated with the choroid. This could  be the site of hemorrhage. No specific vascular lesion is identified. No intraparenchymal hemorrhage. The differential diagnosis does include meningitis.   SIGNIFICANT EVENTS: 10/12: [facility]  10/14: No improvement. MR brain worrisome for worsening hydrocephalus and possibly meningitis. [facility]   10/14- coags corrected and drain IVC placed, high pressure noted 10/15 R frontal IVC placement [name](Stern)  SUBJECTIVE:   No significant change. No distress. Unable to voice complaints  VITAL SIGNS: Temp:  [98.7 F (37.1 C)-99.9 F (37.7 C)] 99.4 F (37.4 C) (10/18 1948) Pulse Rate:  [62-127] 100 (10/18 2105) Resp:  [16-26] 18 (10/18 2105) BP: (120-178)/(37-120) 138/41 mmHg (10/18 2105) SpO2:  [95 %-99 %] 96 % (10/18 2100) Weight:   [61.5 kg (135 lb 9.3 oz)] 61.5 kg (135 lb 9.3 oz) (10/18 0500) HEMODYNAMICS:   VENTILATOR SETTINGS:   INTAKE / OUTPUT:  Intake/Output Summary (Last 24 hours) at 09/07/14 2229 Last data filed at 09/07/14 2100  Gross per 24 hour  Intake   1435 ml  Output   1133 ml  Net    302 ml   PHYSICAL EXAMINATION: General: RASS -1, + F/C Neuro: MAEs HEENT: IVC drain in place Cardiovascular: IR, no M Lungs:  Clear Abdomen:  Soft, BS wnl, no r/g Ext:  No edema  LABS: I have reviewed all of today's lab results. Relevant abnormalities are discussed in the A/P section  CBC  Recent Labs Lab 09/05/14 0545 09/06/14 0500 09/07/14 0255  WBC 11.6* 10.8* 12.2*  HGB 11.7* 9.9* 10.8*  HCT 36.3 30.6* 33.6*  PLT 171 149* 143*   Coag's  Recent Labs Lab 09/03/14 2245 09/04/14 0243 09/05/14 0545 09/06/14 0500  APTT 30  --   --   --   INR 1.46 1.22 1.11 1.11   BMET  Recent Labs Lab 09/05/14 0545 09/06/14 0500 09/07/14 0255  NA 149* 150* 149*  K 3.3* 3.1* 4.2  CL 113* 117* 118*  CO2 23 23 19   BUN 31* 40* 42*  CREATININE 1.21* 1.28* 1.33*  GLUCOSE 152* 164* 156*   Electrolytes  Recent Labs Lab 09/05/14 0545 09/06/14 0500 09/07/14 0255  CALCIUM 8.4 7.5* 7.7*  MG 2.0 2.0  --   PHOS 2.6 2.4  --    Sepsis Markers  Recent Labs Lab 09/01/14 1657  LATICACIDVEN 0.74   ABG  Recent Labs Lab 09/03/14 1738  PHART 7.465*  PCO2ART 27.3*  PO2ART 90.8   Liver Enzymes  Recent Labs Lab 09/01/14 1648 09/02/14 0530  AST 22 19  ALT 17 15  ALKPHOS 81 71  BILITOT 0.5 0.4  ALBUMIN 3.5 2.9*   Cardiac Enzymes  Recent Labs Lab 09/01/14 1648  TROPONINI <0.30   Glucose  Recent Labs Lab 09/06/14 1933 09/06/14 2348 09/07/14 0346 09/07/14 0741 09/07/14 1220 09/07/14 1550  GLUCAP 161* 163* 170* 166* 197* 162*    Imaging Dg Chest Port 1 View  09/06/2014   CLINICAL DATA:  Check endotracheal tube  EXAM: PORTABLE CHEST - 1 VIEW  COMPARISON:  Radiograph 09/04/2014   FINDINGS: No endotracheal tube present. Feeding tube extends into the stomach with weighted tip in the gastric antrum. Central venous line is unchanged. Stable mildly enlarged cardiac silhouette. No pneumothorax. No pulmonary edema.  IMPRESSION: Feeding tube with weighted tip in stomach.   Electronically Signed   By: Genevive BiStewart  Edmunds M.D.   On: 09/06/2014 07:43   ASSESSMENT / PLAN:  PULMONARY OETT A: Air way protection ok for now P:   Supp O2 Monitor in ICU  CARDIOVASCULAR A:  AF, CVR P:  Cont current Rx  RENAL A:  Hypernatremia Hypokalemia P:   Monitor BMET intermittently Monitor I/Os Correct electrolytes as indicated  GASTROINTESTINAL A:   Dysphagia P:   SUP: N/I Cont TFs  HEMATOLOGIC A:   Coumadin induced coagulapthy- resolved P:  DVT px: SQ heparin Monitor CBC intermittently Transfuse per usual ICU guidelines  INFECTIOUS A:   UTI  No evidence meningitis Unlikely encephalitis  Minimal suspicion for bacterial component P:   BCx2 10/12>> NEG UC 10/12: neg  CSF with WBC 168, neutros 85, lymphs 7 ' CSF cx >> NEG  Abx: CTX, start date 10/14, d/c ABX vanc start date 10/14 d/c abx ampicillin date 10/14 d/c Acyclovir start date 10/14>>>10/15  ENDOCRINE A:   DM  2 P:   Cont SSI  NEUROLOGIC A:   Hydrocephalus s/p ventricular drain P:   Further eval and mgmt per neuro and NS  TODAY'S SUMMARY:     .[name], MD ; [facility] Mobile [phone].  After 5:30 PM or weekends, call [phone]

## 2014-09-07 NOTE — Progress Notes (Signed)
No significant change in status. Patient still somewhat obtunded but will awaken to voice. She says a few words but does not converse. Moves all extremities to stimuli but will not role I would follow commands. Ventriculostomy functioning well.  Overall stable. Continue current management.

## 2014-09-08 ENCOUNTER — Inpatient Hospital Stay (HOSPITAL_COMMUNITY): Payer: Medicare (Managed Care)

## 2014-09-08 LAB — CULTURE, BLOOD (ROUTINE X 2)
CULTURE: NO GROWTH
Culture: NO GROWTH

## 2014-09-08 LAB — GLUCOSE, CAPILLARY
GLUCOSE-CAPILLARY: 142 mg/dL — AB (ref 70–99)
Glucose-Capillary: 139 mg/dL — ABNORMAL HIGH (ref 70–99)
Glucose-Capillary: 158 mg/dL — ABNORMAL HIGH (ref 70–99)
Glucose-Capillary: 164 mg/dL — ABNORMAL HIGH (ref 70–99)
Glucose-Capillary: 166 mg/dL — ABNORMAL HIGH (ref 70–99)
Glucose-Capillary: 169 mg/dL — ABNORMAL HIGH (ref 70–99)
Glucose-Capillary: 171 mg/dL — ABNORMAL HIGH (ref 70–99)

## 2014-09-08 MED ORDER — DEXTROSE 5 % IV SOLN
INTRAVENOUS | Status: DC
  Administered 2014-09-08: 1000 mL via INTRAVENOUS

## 2014-09-08 NOTE — Procedures (Signed)
ELECTROENCEPHALOGRAM REPORT  Date of Study: 09/08/2014 ID Number: [id number]  Patient's Name: Susan Calhoun MRN: [id number] Date of Birth: 10-22-1928  Referring Provider: Dr. Felicie Mornavid Smith  Clinical History: This is an 80 year old woman with altered mental status, found to have intracranial bleed s/p VP drain, with continued AMS.  Medications: acetaminophen (TYLENOL) suppository 650 mg  amiodarone (PACERONE) tablet 200 mg  atorvastatin (LIPITOR) tablet 80 mg     Technical Summary: A multichannel digital EEG recording measured by the international 10-20 system with electrodes applied with paste and impedances below 5000 ohms performed as portable with EKG monitoring in an unresponsive patient.  Hyperventilation and photic stimulation were not performed.  The digital EEG was referentially recorded, reformatted, and digitally filtered in a variety of bipolar and referential montages for optimal display.   Description: The patient is unresponsive during the recording.  There is no clear posterior dominant rhythm. The background is disorganized with a moderate amount of diffuse 4-6 Hz theta and 2-3 Hz delta slowing of the waking background, at times sharply contoured without clear epileptogenic potential. There are frequent triphasic-like waves with anteroposterior gradient seen, with occasional shifting asymmetry over the bilateral hemispheres. Normal sleep architecture is not seen.  There were no clear epileptiform discharges or electrographic seizures seen.    EKG lead showed irregular rhythm.  Impression: This EEG is abnormal due to the presence of: 1. Diffuse slowing of the background 2. Occasional triphasic-like waves   Clinical Correlation of the above findings indicates diffuse cerebral dysfunction that is non-specific in etiology and can be seen with hypoxic/ischemic injury, toxic/metabolic encephalopathies, or medication effect.  Triphasic-like waves are typically seen with hepatic  encephalopathy but may be seen with other metabolic encephalopathies as well.  The absence of epileptiform discharges does not rule out a clinical diagnosis of epilepsy.  Clinical correlation is advised.   Susan Calhoun, M.D.

## 2014-09-08 NOTE — Progress Notes (Signed)
PULMONARY / CRITICAL CARE MEDICINE - consult   Name: Susan Calhoun MRN: [id number] DOB: 11/23/1927    ADMISSION DATE:  09/01/2014 CONSULTATION DATE:  10/14   REFERRING MD :  Susan Calhoun   CHIEF COMPLAINT:  Acute encephalopathy   INITIAL PRESENTATION:   80 y.o. female with PMH of dm, AF (on rate cntl and coumadin). Admitted 09/01/14 with working dx of UTI and acute encephalopathy. CT head Patient was started on empiric Levaquin for possible urinary tract infection. CT head was negative on admit. Had no improvement so MRI obtained on 10/14 showing:  worsening hydrocephalus and layering material in the occipital horns of the lateral ventricles w/ question of hemorrhage raising concern for meningitis. [facility]   STUDIES:  CT head 10/14: Mild, likely age-appropriate, centralized volume loss without acute intracranial process MR brain 10/14: Acute hydrocephalus, worsened since 3 days ago. Layering material in the occipital horns of lateral ventricles, at least partly hemorrhagic. Question focal thrombus in the atrium of the right lateral ventricle, possibly associated with the choroid. This could  be the site of hemorrhage. No specific vascular lesion is identified. No intraparenchymal hemorrhage. The differential diagnosis does include meningitis.   SIGNIFICANT EVENTS: 10/12: [facility]  10/14: No improvement. MR brain worrisome for worsening hydrocephalus and possibly meningitis. [facility]   10/14- coags corrected and drain IVC placed, high pressure noted 10/15 R frontal IVC placement Susan Calhoun([place])  SUBJECTIVE:   Low gr fever No distress.  Unable to voice complaints  VITAL SIGNS: Temp:  [98.6 F (37 C)-100.8 F (38.2 C)] 100.8 F (38.2 C) (10/19 0755) Pulse Rate:  [37-127] 108 (10/19 1000) Resp:  [16-25] 19 (10/19 1000) BP: (87-178)/(37-102) 130/54 mmHg (10/19 1000) SpO2:  [95 %-99 %] 98 % (10/19 1000) Weight:  [60.6 kg  (133 lb 9.6 oz)] 60.6 kg (133 lb 9.6 oz) (10/19 0200) HEMODYNAMICS:   VENTILATOR SETTINGS:   INTAKE / OUTPUT:  Intake/Output Summary (Last 24 hours) at 09/08/14 1012 Last data filed at 09/08/14 1000  Gross per 24 hour  Intake   1680 ml  Output   1274 ml  Net    406 ml   PHYSICAL EXAMINATION: General: RASS -1, + F/C Neuro: MAEs HEENT: IVC drain in place Cardiovascular: IR, no M Lungs:  Clear Abdomen:  Soft, BS wnl, no r/g Ext:  No edema  LABS: I have reviewed all of today's lab results. Relevant abnormalities are discussed in the A/P section  CBC  Recent Labs Lab 09/05/14 0545 09/06/14 0500 09/07/14 0255  WBC 11.6* 10.8* 12.2*  HGB 11.7* 9.9* 10.8*  HCT 36.3 30.6* 33.6*  PLT 171 149* 143*   Coag's  Recent Labs Lab 09/03/14 2245 09/04/14 0243 09/05/14 0545 09/06/14 0500  APTT 30  --   --   --   INR 1.46 1.22 1.11 1.11   BMET  Recent Labs Lab 09/05/14 0545 09/06/14 0500 09/07/14 0255  NA 149* 150* 149*  K 3.3* 3.1* 4.2  CL 113* 117* 118*  CO2 23 23 19   BUN 31* 40* 42*  CREATININE 1.21* 1.28* 1.33*  GLUCOSE 152* 164* 156*   Electrolytes  Recent Labs Lab 09/05/14 0545 09/06/14 0500 09/07/14 0255  CALCIUM 8.4 7.5* 7.7*  MG 2.0 2.0  --   PHOS 2.6 2.4  --    Sepsis Markers  Recent Labs Lab 09/01/14 1657  LATICACIDVEN 0.74   ABG  Recent Labs Lab 09/03/14 1738  PHART 7.465*  PCO2ART  27.3*  PO2ART 90.8   Liver Enzymes  Recent Labs Lab 09/01/14 1648 09/02/14 0530  AST 22 19  ALT 17 15  ALKPHOS 81 71  BILITOT 0.5 0.4  ALBUMIN 3.5 2.9*   Cardiac Enzymes  Recent Labs Lab 09/01/14 1648  TROPONINI <0.30   Glucose  Recent Labs Lab 09/07/14 1220 09/07/14 1550 09/07/14 1948 09/07/14 2327 09/08/14 0343 09/08/14 0754  GLUCAP 197* 162* 154* 158* 142* 139*    Imaging No results found. ASSESSMENT / PLAN:  NEUROLOGIC A:   Hydrocephalus s/p ventricular drain -unclear cause P:   Further eval and mgmt per NS Neuro  consulted   PULMONARY OETT A: Air way protection ok for now P:   Supp O2 Monitor in ICU  CARDIOVASCULAR A:  AF, CVR P:  Cont current Rx  RENAL A:  Hypernatremia Hypokalemia P:   Monitor BMET intermittently Monitor I/Os Correct electrolytes as indicated Add d5w @ 50/h  GASTROINTESTINAL A:   Dysphagia P:   SUP: N/I Cont TFs  HEMATOLOGIC A:   Coumadin induced coagulapthy- resolved P:  DVT px: SQ heparin Monitor CBC intermittently Transfuse per usual ICU guidelines  INFECTIOUS A:   UTI  No evidence meningitis Unlikely encephalitis  Minimal suspicion for bacterial component P:   BCx2 10/12>> NEG UC 10/12: neg  CSF with WBC 168, neutros 85, lymphs 7 ' CSF cx >> NEG  Abx: CTX, start date 10/14, d/c ABX vanc start date 10/14 d/c abx ampicillin date 10/14 d/c Acyclovir start date 10/14>>>10/15  ENDOCRINE A:   DM  2 P:   Cont SSI    TODAY'S SUMMARY: Remains obtunded s/p v stomy ? etio Updated grand daughter  The patient is critically ill with multiple organ systems failure and requires high complexity decision making for assessment and support, frequent evaluation and titration of therapies, application of advanced monitoring technologies and extensive interpretation of multiple databases. Critical Care Time devoted to patient care services described in this note is 32 minutes.    Susan Mourningakesh Evann Koelzer MD. Tonny BollmanFCCP. [facility] Pager [phone] If no response call [phone]

## 2014-09-08 NOTE — Consult Note (Addendum)
NEURO HOSPITALIST CONSULT NOTE    Reason for Consult: AMS S/P UTI and placement of venticulostomy  HPI:                                                                                                                                          Susan Calhoun is an 80 y.o. female who was noted by family to be acting confused > 2 weeks prior.  Patient was brought to the hospital where she was diagnosed with a UTI.  She was given Rocephin and sent home on Keflex.  Due to not being awake enough to take her medications she was brought back to the hospital. MRI brain showed "layering material in the occipital horns of lateral ventricles, at least partly hemorrhagic. Question focal thrombus in the atrium of the right lateral ventricle, possibly associated with the choroid. This could be the site of hemorrhage". Infectious disease was consulted and they do not feel this is bacterial meningoencephalitis and ABX were D/C'd.  Neuro surgery placed a Ventriculostomy drain for HCP. Since drain placement her mentation has not fully resolved.  Repeat head CT shows no worsening of HCP or blood products. Neurology was asked to further evaluate.   Presently, patient intubated on the vent.  Temp 100.8 WBC 12.2 CSF: WBC 168, red blood cells 70000, lymph 7, neutrophile 85, glucose 78, protein 139  Past Medical History  Diagnosis Date  . Diabetes mellitus with diabetic polyneuropathy   . A-fib   . Renal disorder   . Hypertension   . Anemia   . Glaucoma     right eye   . Carotid artery stenosis   . PVD (peripheral vascular disease)     Past Surgical History  Procedure Laterality Date  . Replacement total knee Right   . Carotid endarterectomy  2015  . Coronary artery bypass graft    . Ablation      cardiac for arrthymia     History reviewed. No pertinent family history.  Family History: Unable to obtain  Social History:  reports that she has never smoked. She does not have any  smokeless tobacco history on file. She reports that she does not drink alcohol or use illicit drugs.  Allergies  Allergen Reactions  . Septra [Sulfamethoxazole-Trimethoprim] Hives    MEDICATIONS:  Prior to Admission:  Prescriptions prior to admission  Medication Sig Dispense Refill  . acetaminophen (TYLENOL) 500 MG tablet Take 1,000 mg by mouth 3 (three) times daily as needed (pain).      Marland Kitchen allopurinol (ZYLOPRIM) 100 MG tablet Take 100 mg by mouth 2 (two) times daily.       Marland Kitchen aspirin EC 81 MG tablet Take 81 mg by mouth daily with breakfast.       . atorvastatin (LIPITOR) 80 MG tablet Take 80 mg by mouth daily after supper.       . carvedilol (COREG) 25 MG tablet Take 25 mg by mouth 2 (two) times daily with a meal.      . cephALEXin (KEFLEX) 500 MG capsule Take 1 capsule (500 mg total) by mouth 4 (four) times daily.  28 capsule  0  . cholecalciferol (VITAMIN D) 1000 UNITS tablet Take 1,000 Units by mouth every other day.      . Cyanocobalamin (VITAMIN B-12 PO) Take 1 tablet by mouth every other day.      . ferrous sulfate 325 (65 FE) MG tablet Take 325 mg by mouth daily with breakfast.      . furosemide (LASIX) 20 MG tablet Take 20 mg by mouth 2 (two) times daily.       Marland Kitchen lisinopril (PRINIVIL,ZESTRIL) 5 MG tablet Take 5 mg by mouth daily with breakfast.       . metFORMIN (GLUCOPHAGE) 500 MG tablet Take 500 mg by mouth daily with breakfast.       . NIFEdipine (NIFEDICAL XL) 60 MG 24 hr tablet Take 60 mg by mouth daily after supper.       . Travoprost, BAK Free, (TRAVATAN) 0.004 % SOLN ophthalmic solution Place 1 drop into the right eye at bedtime.      Marland Kitchen warfarin (COUMADIN) 5 MG tablet Take 5-10 mg by mouth daily after supper. Take 2 tablets (10 mg) on Monday, then take 1 tablet (5 mg) on Tuesday thru Sunday       Scheduled: . amiodarone  200 mg Per Tube Daily  . antiseptic  oral rinse  7 mL Mouth Rinse q12n4p  . atorvastatin  80 mg Per Tube QPC supper  . chlorhexidine  15 mL Mouth Rinse BID  . free water  200 mL Per Tube 3 times per day  . insulin aspart  0-15 Units Subcutaneous 6 times per day  . latanoprost  1 drop Right Eye QHS  . metoprolol tartrate  25 mg Per Tube BID     ROS:                                                                                                                                       History obtained from the patient  General ROS: negative for - chills, fatigue, fever, night sweats, weight gain or weight loss Psychological ROS: negative for - behavioral disorder,  hallucinations, memory difficulties, mood swings or suicidal ideation Ophthalmic ROS: negative for - blurry vision, double vision, eye pain or loss of vision ENT ROS: negative for - epistaxis, nasal discharge, oral lesions, sore throat, tinnitus or vertigo Allergy and Immunology ROS: negative for - hives or itchy/watery eyes Hematological and Lymphatic ROS: negative for - bleeding problems, bruising or swollen lymph nodes Endocrine ROS: negative for - galactorrhea, hair pattern changes, polydipsia/polyuria or temperature intolerance Respiratory ROS: negative for - cough, hemoptysis, shortness of breath or wheezing Cardiovascular ROS: negative for - chest pain, dyspnea on exertion, edema or irregular heartbeat Gastrointestinal ROS: negative for - abdominal pain, diarrhea, hematemesis, nausea/vomiting or stool incontinence Genito-Urinary ROS: negative for - dysuria, hematuria, incontinence or urinary frequency/urgency Musculoskeletal ROS: negative for - joint swelling or muscular weakness Neurological ROS: as noted in HPI Dermatological ROS: negative for rash and skin lesion changes   Blood pressure 130/54, pulse 108, temperature 100.8 F (38.2 C), temperature source Axillary, resp. rate 19, height 4\' 11"  (1.499 m), weight 60.6 kg (133 lb 9.6 oz), SpO2  98.00%.   Neurologic Examination:                                                                                                      General: NAD Mental Status: Drowsy, follows no commands. No verbal output other than saying "yaa" to all questions.  Cranial Nerves: II: Discs flat bilaterally; blink to threat intact, left pupil asymmetric (S/P surgery) right pupil shows 2mm and symmetric III,IV, VI: ptosis not present, extra-ocular motions intact bilaterally and closes eyes tightly to light shown in eyes.  V,VII: smile symmetric, winces to pain  VIII: hearing normal bilaterally IX,X: gag reflex present XI: bilateral shoulder shrug XII: midline tongue extension   Motor: moving all extremities antigravity with good strength and symmetrically Sensory: Pinprick and light touch intact throughout, bilaterally Deep Tendon Reflexes:  1+ throughout  Plantars: Right: downgoing   Left: downgoing Cerebellar: Unable to assess Gait: unable to assess CV: pulses palpable throughout    Lab Results: Basic Metabolic Panel:  Recent Labs Lab 09/02/14 0530 09/04/14 0243 09/05/14 0545 09/06/14 0500 09/07/14 0255  NA 144 147 149* 150* 149*  K 3.8 3.2* 3.3* 3.1* 4.2  CL 108 111 113* 117* 118*  CO2 20 22 23 23 19   GLUCOSE 149* 152* 152* 164* 156*  BUN 24* 35* 31* 40* 42*  CREATININE 1.08 1.27* 1.21* 1.28* 1.33*  CALCIUM 8.6 8.4 8.4 7.5* 7.7*  MG  --   --  2.0 2.0  --   PHOS  --   --  2.6 2.4  --     Liver Function Tests:  Recent Labs Lab 09/01/14 1648 09/02/14 0530  AST 22 19  ALT 17 15  ALKPHOS 81 71  BILITOT 0.5 0.4  PROT 8.1 6.8  ALBUMIN 3.5 2.9*   No results found for this basename: LIPASE, AMYLASE,  in the last 168 hours  Recent Labs Lab 09/01/14 2116  AMMONIA 20    CBC:  Recent Labs Lab 09/01/14 1648 09/02/14 0530 09/04/14 0243 09/05/14 0545  09/06/14 0500 09/07/14 0255  WBC 9.9 11.1* 11.4* 11.6* 10.8* 12.2*  NEUTROABS 8.4*  --   --   --   --   --    HGB 12.9 12.5 10.6* 11.7* 9.9* 10.8*  HCT 39.2 39.4 31.9* 36.3 30.6* 33.6*  MCV 90.5 91.8 90.1 91.7 91.3 94.1  PLT 252 237 169 171 149* 143*    Cardiac Enzymes:  Recent Labs Lab 09/01/14 1648  TROPONINI <0.30    Lipid Panel:  Recent Labs Lab 09/02/14 0530  CHOL 204*  TRIG 149  HDL 64  CHOLHDL 3.2  VLDL 30  LDLCALC 161*    CBG:  Recent Labs Lab 09/07/14 1550 09/07/14 1948 09/07/14 2327 09/08/14 0343 09/08/14 0754  GLUCAP 162* 154* 158* 142* 139*    Microbiology: Results for orders placed during the hospital encounter of 09/01/14  URINE CULTURE     Status: None   Collection Time    09/01/14  8:52 PM      Result Value Ref Range Status   Specimen Description URINE, RANDOM   Final   Special Requests NONE   Final   Culture  Setup Time     Final   Value: 09/02/2014 05:33     Performed at [facility] Count     Final   Value: NO GROWTH     Performed at [facility]   Culture     Final   Value: NO GROWTH     Performed at [facility]   Report Status 09/03/2014 FINAL   Final  CULTURE, BLOOD (ROUTINE X 2)     Status: None   Collection Time    09/01/14  9:18 PM      Result Value Ref Range Status   Specimen Description BLOOD RIGHT ARM   Final   Special Requests BOTTLES DRAWN AEROBIC AND ANAEROBIC 4CC   Final   Culture  Setup Time     Final   Value: 09/02/2014 04:13     Performed at [facility]   Culture     Final   Value: NO GROWTH 5 DAYS     Performed at [facility]   Report Status 09/08/2014 FINAL   Final  CULTURE, BLOOD (ROUTINE X 2)     Status: None   Collection Time    09/01/14  9:45 PM      Result Value Ref Range Status   Specimen Description BLOOD RIGHT HAND   Final   Special Requests BOTTLES DRAWN AEROBIC ONLY 8CC   Final   Culture  Setup Time     Final   Value: 09/02/2014 04:12     Performed at [facility]   Culture     Final   Value: NO GROWTH 5 DAYS     Performed at [facility]   Report Status 09/08/2014 FINAL   Final  MRSA PCR SCREENING     Status: None   Collection Time    09/03/14  4:41 PM      Result Value Ref Range Status   MRSA by PCR NEGATIVE  NEGATIVE Final   Comment:            The GeneXpert MRSA Assay (FDA     approved for NASAL specimens     only), is one component of a     comprehensive MRSA colonization     surveillance program. It is not     intended to diagnose MRSA  infection nor to guide or     monitor treatment for     MRSA infections.  CSF CULTURE     Status: None   Collection Time    09/03/14  5:40 PM      Result Value Ref Range Status   Specimen Description CSF   Final   Special Requests NONE   Final   Gram Stain     Final   Value: RARE WBC PRESENT,BOTH PMN AND MONONUCLEAR     NO ORGANISMS SEEN     Performed at [facility]     Performed at [facility]   Culture     Final   Value: NO GROWTH 3 DAYS     Performed at [facility]   Report Status 09/07/2014 FINAL   Final  GRAM STAIN     Status: None   Collection Time    09/03/14  5:40 PM      Result Value Ref Range Status   Specimen Description CSF   Final   Special Requests NONE   Final   Gram Stain     Final   Value: RARE WBC PRESENT,BOTH PMN AND MONONUCLEAR     NO ORGANISMS SEEN   Report Status 09/04/2014 FINAL   Final  CLOSTRIDIUM DIFFICILE BY PCR     Status: None   Collection Time    09/06/14 12:00 PM      Result Value Ref Range Status   C difficile by pcr NEGATIVE  NEGATIVE Final    Coagulation Studies:  Recent Labs  09/06/14 0500  LABPROT 14.4  INR 1.11    Imaging: Ct Head Wo Contrast  09/08/2014   CLINICAL DATA:  Altered mental status with acute encephalopathy and somnolence. Hydrocephalus on recent imaging.  EXAM: CT HEAD WITHOUT CONTRAST  TECHNIQUE: Contiguous axial images were obtained from the base of the skull through the vertex without intravenous contrast.  COMPARISON:  Head CT 09/04/2014  FINDINGS: Images are  mildly to moderately degraded by motion artifact.  Right frontal approach ventriculostomy catheter remains in place terminating in the third ventricle, unchanged. Dilatation of the third ventricle has not significantly changed, measuring approximately 1.5 cm in diameter. Slightly increased dilatation of the temporal horns of the lateral ventricles is questioned, however apparent differences may be due to differences in angulation compared with the prior CT. The temporal horns do not appear significantly changed in size compared to the 09/03/2014 MRI. Small amount of blood layering in the occipital horns of lateral ventricles does not appear significantly changed. Trace subarachnoid blood is suspected in a right parietal sulcus (series 2, image 18). There is also likely a small amount of subarachnoid blood in the posterior aspects of the right greater than left sylvian fissures. There is no evidence of acute large territory cortical infarct. There is no midline shift or mass.  Prior bilateral cataract extraction is noted. There is minimal right and mild left maxillary sinus mucosal thickening. Mild sphenoid sinus mucosal thickening is also noted. Mastoid air cells are clear. Carotid siphon calcification is noted. Enteric tube is partially visualized.  IMPRESSION: 1. Unchanged dilatation of the third ventricle and likely no significant interval change in dilatation of the lateral ventricles. 2. Unchanged intraventricular blood. 3. Small volume of subarachnoid blood.   Electronically Signed   By: Sebastian Ache   On: 09/08/2014 11:02       Assessment and plan per attending neurologist  Susan Morn PA-C Susan Calhoun [phone]  09/08/2014, 11:31  AM   Assessment/Plan:  80 YO female initially was on coumadin with sub therapeutic INR presenting to hospital with UTI and AMS.  Later found to have bilateral occipital intraventricular blood product with hydrocephalus.  Intraventricular drain placed  with no improvement of mental status.  CSF  obtained showing no source of infection.  Etiology of AMS unclear at this time but suspect could be related to underlying primary intraventricular hemorrhage located at the occipital horns lateral ventricules.  Will order EEG to R/O status epilepticus.  Stroke team to follow up in the morning.   Recommend: 1) EEG 2) Will continue to follow   Patient seen and examined together with physician assistant and I concur with the assessment and plan.  Susan Portelasvaldo Masaru Chamberlin, MD

## 2014-09-08 NOTE — Progress Notes (Signed)
PT Cancellation Note  Patient Details Name: Susan Calhoun MRN: [id number] DOB: 02/20/1928   Cancelled Treatment:    Reason Eval/Treat Not Completed: Patient at procedure or test/unavailable , CT   Fabio AsaWerner, Jeani Fassnacht J 09/08/2014, 9:57 AM Charlotte Crumbevon Donella Pascarella, PT DPT  [phone]

## 2014-09-08 NOTE — Progress Notes (Signed)
EEG Completed; Results Pending  

## 2014-09-08 NOTE — Progress Notes (Signed)
Patient ID: Susan Calhoun, female   DOB: 04/03/1928, 80 y.o.   MRN: [id number] Pt opens eyes spontaneously now, occasionally FC per nursing staff, says "ouch" to painful stimuli. Ventric patent with 1-9 cc/ hr pinkish CSF. Moves all extremities.  I/ve never been convinced Hydrocephalus was her main problem (she may have baseline ventriculomegaly), and ventric size didn't really diminish when her first post-EVD Ct was completed. Will repeat CT head today and raise ventric to 15 cm and follow exam/ CTs. Hopefully wean drain and avoid permanent shunting.

## 2014-09-09 ENCOUNTER — Inpatient Hospital Stay (HOSPITAL_COMMUNITY): Payer: Medicare (Managed Care)

## 2014-09-09 DIAGNOSIS — I619 Nontraumatic intracerebral hemorrhage, unspecified: Secondary | ICD-10-CM | POA: Insufficient documentation

## 2014-09-09 LAB — GLUCOSE, CAPILLARY
GLUCOSE-CAPILLARY: 189 mg/dL — AB (ref 70–99)
Glucose-Capillary: 180 mg/dL — ABNORMAL HIGH (ref 70–99)
Glucose-Capillary: 160 mg/dL — ABNORMAL HIGH (ref 70–99)
Glucose-Capillary: 189 mg/dL — ABNORMAL HIGH (ref 70–99)
Glucose-Capillary: 143 mg/dL — ABNORMAL HIGH (ref 70–99)
Glucose-Capillary: 175 mg/dL — ABNORMAL HIGH (ref 70–99)

## 2014-09-09 LAB — BASIC METABOLIC PANEL
Anion gap: 11 (ref 5–15)
BUN: 38 mg/dL — ABNORMAL HIGH (ref 6–23)
CHLORIDE: 108 meq/L (ref 96–112)
CO2: 24 meq/L (ref 19–32)
Calcium: 7.9 mg/dL — ABNORMAL LOW (ref 8.4–10.5)
Creatinine, Ser: 1.26 mg/dL — ABNORMAL HIGH (ref 0.50–1.10)
GFR calc Af Amer: 44 mL/min — ABNORMAL LOW (ref >90)
GFR calc non Af Amer: 38 mL/min — ABNORMAL LOW (ref >90)
Glucose, Bld: 151 mg/dL — ABNORMAL HIGH (ref 70–99)
Potassium: 3.7 mEq/L (ref 3.7–5.3)
Sodium: 143 mEq/L (ref 137–147)

## 2014-09-09 LAB — HIV-1 RNA QUANT-NO REFLEX-BLD: HIV 1 RNA Quant: <20 copies/mL (ref <20)

## 2014-09-09 LAB — CBC
HCT: 29.9 % — ABNORMAL LOW (ref 36.0–46.0)
Hemoglobin: 9.6 g/dL — ABNORMAL LOW (ref 12.0–15.0)
MCH: 29.5 pg (ref 26.0–34.0)
MCHC: 32.1 g/dL (ref 30.0–36.0)
MCV: 92 fL (ref 78.0–100.0)
Platelets: 163 10*3/uL (ref 150–400)
RBC: 3.25 MIL/uL — ABNORMAL LOW (ref 3.87–5.11)
RDW: 17.5 % — ABNORMAL HIGH (ref 11.5–15.5)
WBC: 11.3 10*3/uL — ABNORMAL HIGH (ref 4.0–10.5)

## 2014-09-09 NOTE — Progress Notes (Signed)
STROKE TEAM PROGRESS NOTE   HISTORY Susan Calhoun is an 80 y.o. female who was noted by family to be acting confused > 2 weeks prior. Patient was brought to the hospital where she was diagnosed with a UTI. She was given Rocephin and sent home on Keflex. Due to not being awake enough to take her medications she was brought back to the hospital. MRI brain showed "layering material in the occipital horns of lateral ventricles, at least partly hemorrhagic. Question focal thrombus in the atrium of the right lateral ventricle, possibly associated with the choroid. This could be the site of hemorrhage". Infectious disease was consulted and they do not feel this is bacterial meningoencephalitis and ABX were D/C'd. Neuro surgery placed a Ventriculostomy drain for HCP. Since drain placement her mentation has not fully resolved. Repeat head CT shows no worsening of HCP or blood products. Neurology was asked to further evaluate. At time of evaluation, patient intubated on the vent, Temp 100.8, WBC 12.2. CSF: WBC 168, red blood cells 70000, lymph 7, neutrophile 85, glucose 78, protein 139   SUBJECTIVE (INTERVAL HISTORY) No family is at the bedside. CCM at bedside.   OBJECTIVE Temp:  [99.1 F (37.3 C)-100.9 F (38.3 C)] 99.1 F (37.3 C) (10/20 0742) Pulse Rate:  [78-121] 92 (10/20 0700) Cardiac Rhythm:  [-] Atrial fibrillation (10/20 0400) Resp:  [17-26] 22 (10/20 0700) BP: (118-175)/(35-107) 168/75 mmHg (10/20 0700) SpO2:  [95 %-99 %] 97 % (10/20 0700)   Recent Labs Lab 09/08/14 1552 09/08/14 1940 09/08/14 2331 09/09/14 0313 09/09/14 0740  GLUCAP 164* 171* 169* 180* 160*    Recent Labs Lab 09/04/14 0243 09/05/14 0545 09/06/14 0500 09/07/14 0255 09/09/14 0500  NA 147 149* 150* 149* 143  K 3.2* 3.3* 3.1* 4.2 3.7  CL 111 113* 117* 118* 108  CO2 22 23 23 19 24   GLUCOSE 152* 152* 164* 156* 151*  BUN 35* 31* 40* 42* 38*  CREATININE 1.27* 1.21* 1.28* 1.33* 1.26*  CALCIUM 8.4 8.4 7.5*  7.7* 7.9*  MG  --  2.0 2.0  --   --   PHOS  --  2.6 2.4  --   --    No results found for this basename: AST, ALT, ALKPHOS, BILITOT, PROT, ALBUMIN,  in the last 168 hours  Recent Labs Lab 09/04/14 0243 09/05/14 0545 09/06/14 0500 09/07/14 0255 09/09/14 0500  WBC 11.4* 11.6* 10.8* 12.2* 11.3*  HGB 10.6* 11.7* 9.9* 10.8* 9.6*  HCT 31.9* 36.3 30.6* 33.6* 29.9*  MCV 90.1 91.7 91.3 94.1 92.0  PLT 169 171 149* 143* 163   No results found for this basename: CKTOTAL, CKMB, CKMBINDEX, TROPONINI,  in the last 168 hours No results found for this basename: LABPROT, INR,  in the last 72 hours No results found for this basename: COLORURINE, APPERANCEUR, LABSPEC, PHURINE, GLUCOSEU, HGBUR, BILIRUBINUR, KETONESUR, PROTEINUR, UROBILINOGEN, NITRITE, LEUKOCYTESUR,  in the last 72 hours     Component Value Date/Time   CHOL 204* 09/02/2014 0530   TRIG 149 09/02/2014 0530   HDL 64 09/02/2014 0530   CHOLHDL 3.2 09/02/2014 0530   VLDL 30 09/02/2014 0530   LDLCALC 110* 09/02/2014 0530   Lab Results  Component Value Date   HGBA1C 5.6 09/01/2014   No results found for this basename: labopia, cocainscrnur, labbenz, amphetmu, thcu, labbarb    No results found for this basename: ETH,  in the last 168 hours    Ct Head Wo Contrast 09/08/2014  1. Unchanged dilatation of the third ventricle  and likely no significant interval change in dilatation of the lateral ventricles. 2. Unchanged intraventricular blood. 3. Small volume of subarachnoid blood.    09/05/2014   1. Interval placement of right frontal approach ventriculostomy catheter with tip in the third ventricle. Ventricles remain dilated but are slightly decreased in size relative to previous MRI from 09/03/2014. 2. Layering hemorrhage within the occipital horns of the lateral ventricles bilaterally, stable from prior. 3. Question trace acute subarachnoid hemorrhage within the right sylvian fissure.     08/31/2014  Mild, likely age-appropriate,  centralized volume loss without acute intracranial process.     Susan Calhoun W Wo Contrast 09/03/2014    Acute hydrocephalus, worsened since 3 days ago. Layering material in the occipital horns of lateral ventricles, at least partly hemorrhagic. Question focal thrombus in the atrium of the right lateral ventricle, possibly associated with the choroid. This could be the site of hemorrhage. No specific vascular lesion is identified. No intraparenchymal hemorrhage. The differential diagnosis does include meningitis.    Dg Chest Port 1 View 09/06/2014   Feeding tube with weighted tip in stomach.   09/04/2014    New left jugular central venous catheter tip overlies the distal SVC. No evidence of pneumothorax.  Stable cardiomegaly.  No active lung disease.     Dg Chest 2 View 08/31/2014    Borderline cardiomegaly and lung hyperexpansion without acute cardiopulmonary disease.     PHYSICAL EXAM Frail elderly Caucasian lady not in distress. . Afebrile. Head is nontraumatic. Neck is supple without bruit.  Cardiac exam no murmur or gallop. Lungs are clear to auscultation. Distal pulses are well felt. Neurological Exam : Drowsy. Barely opens eyes to stimuli. Pupils irregular but reactive. Slight disconjugate gaze with left eye hypotropia. Roving eye movements present. Does not blink to threat on either side. Fundi could not be visualized. Face is symmetric. Tongue is midline. Moves all 4 extremities purposefully against gravity but mild left sided weakness more in the arm than the leg. Left plantar upgoing right equivocal. Sensation and coordination cannot be reliably tested. Gait was not tested.  ASSESSMENT/PLAN Susan Calhoun is a 80 y.o. female with history of altered mental status following UTI, seen in the ED 08/31/2014 - admitted 09/01/2014 with confusion. Patient was on coumadin at that time for atrial fibrillation. INR no checked. Administered Levaquin. BP elevated on the 11th at 166/44, max BP next  day 189/112. In retrospect, initial CT with L IVH. Repeat CT done during workup found to have worsening of IVH with bilateral occipital intraventricular blood with hydrocephalus. Hypertension and coumadin associated coagulapathy etiology of hemorrhage.   Stroke:  Non-dominant IVH secondary to coumadin associated coagulopathy in setting of uncontrolled hypertension and levaquin use.   Intraventricular drain placed with no improvement of mental status.  CSF without source of infection.   SCDs for VTE prophylaxis   NPO  Up with assistance  warfarin prior to admission  Ongoing aggressive risk factor management  Resultant confusion, unable to follow commands  Therapy recommendations:  SNF  Disposition:  SNF  Atrial Fibrillation  Home meds:  Warfarin prior to admission  INR 1.51 on admission 10/12. Not checked 10/11 when seen in ED  No longer an anticoagulation candidate secondary to hemorrhage   Hypertension  Stable  Hyperlipidemia  Home meds:  lipitor 80 resumed in hospital  LDL 110 goal < 70  Continue statin at discharge  Diabetes  HgbA1c 5.6 goal < 7.0  Controlled  Other Stroke Risk Factors Advanced age  Obesity, Body mass index is 26.97 kg/(m^2).    Coronary artery disease - s/p CABG ICA stenosis s/p CEA 2015 PVD  Other Active Problems  UTI with pyelonephritis  CKD   Hospital day # 8  SHARON BIBY, MSN, RN, ANVP-BC, ANP-BC, [facility] Pager: [phone] 09/09/2014 10:12 AM  This patient is critically ill and at significant risk of neurological worsening, death and care requires constant monitoring of vital signs, hemodynamics,respiratory and cardiac monitoring,review of multiple databases, neurological assessment, discussion with family, other specialists and medical decision making of high complexity.I have made any additions or clarifications directly to the above note.  I spent 30 minutes of neurocritical care time  in the  care of  this patient. I have personally examined this patient, reviewed notes, independently viewed imaging studies, participated in medical decision making and plan of care. I have made any additions or clarifications directly to the above note. Agree with note above.   Delia Heady, MD Medical Director [facility] Pager: [phone] 09/09/2014 8:30 PM    To contact Stroke Continuity provider, please refer to [email or web address]. After hours, contact General Neurology

## 2014-09-09 NOTE — Progress Notes (Signed)
Physical Therapy Treatment Patient Details Name: Susan Calhoun MRN: [id number] DOB: 07/23/1928 Today's Date: 09/09/2014    History of Present Illness 80 yo wf admitted on 09/01/14 with MS changes. Initial CT showed some ventriculomegaly. Was being treated for UTI. MS changes worsened and an MRI was performed revelaed IVH, Ventricular drain placed on 09/04/14.    PT Comments    Patient remains significantly lethargic and unable to follow commands at this time. Patient continues to demonstrates active movement in all extremities and was able to improve with sitting balance compared to previous session. Will continue to see and progress as tolerated.   Follow Up Recommendations  SNF;Supervision/Assistance - 24 hour     Equipment Recommendations   (TBD)    Recommendations for Other Services       Precautions / Restrictions Precautions Precaution Comments: ventricular drain Restrictions Weight Bearing Restrictions: No    Mobility  Bed Mobility Overal bed mobility: +2 for physical assistance;Needs Assistance Bed Mobility: Supine to Sit;Sit to Supine     Supine to sit: +2 for physical assistance;Total assist Sit to supine: +2 for physical assistance;Total assist      Transfers                    Ambulation/Gait                 Stairs            Wheelchair Mobility    Modified Rankin (Stroke Patients Only)       Balance Overall balance assessment: Needs assistance Sitting-balance support: Bilateral upper extremity supported;Feet supported Sitting balance-Leahy Scale: Poor Sitting balance - Comments: Patient demonstrates brief periods of self supporting in sitting EOB with UE self supporting, however, unable to accept challege or correct balance after COG altered                            Cognition Arousal/Alertness: Lethargic Behavior During Therapy: Flat affect Overall Cognitive Status: Impaired/Different from  baseline Area of Impairment: Attention;Following commands               General Comments: opened eyes momentarily after returned to supine    Exercises      General Comments        Pertinent Vitals/Pain Pain Assessment: Faces Faces Pain Scale: Hurts a little bit Pain Location: withdrawls to some stimuli Pain Descriptors / Indicators: Grimacing Pain Intervention(s): Repositioned    Home Living                      Prior Function            PT Goals (current goals can now be found in the care plan section) Acute Rehab PT Goals Patient Stated Goal: unable to state PT Goal Formulation: Patient unable to participate in goal setting Time For Goal Achievement: 09/18/14 Potential to Achieve Goals: Fair Progress towards PT goals: Progressing toward goals (improved balance EOB)    Frequency  Min 2X/week    PT Plan      Co-evaluation PT/OT/SLP Co-Evaluation/Treatment: Yes Reason for Co-Treatment: Complexity of the patient's impairments (multi-system involvement) PT goals addressed during session: Mobility/safety with mobility       End of Session   Activity Tolerance: Patient limited by fatigue;Patient limited by lethargy Patient left: in bed;with call bell/phone within reach;with bed alarm set (in chair position)     Time: [id number] PT Time Calculation (min): 29 min  Charges:  $Therapeutic Activity: 8-22 mins                    G CodesFabio Calhoun:      Susan Calhoun 09/09/2014, 3:19 PM Susan Calhoun, PT DPT  [phone]

## 2014-09-09 NOTE — Progress Notes (Signed)
UR completed. Await removal of drain for transfer out of ICU.  Carlyle LipaMichelle Captola Teschner, RN BSN MHA CCM Trauma/Neuro ICU Case Manager [phone]

## 2014-09-09 NOTE — Progress Notes (Signed)
eLink Physician-Brief Progress Note Patient Name: Susan Calhoun DOB: 06/28/1928 MRN: [id number]   Date of Service  09/09/2014  HPI/Events of Note  Restraints required for pt safety  eICU Interventions  Ordered soft restraints prn      Intervention Category Minor Interventions: Routine modifications to care plan (e.g. PRN medications for pain, fever)  Sandrea HughsMichael Taahir Grisby 09/09/2014, 8:49 PM

## 2014-09-09 NOTE — Progress Notes (Signed)
Occupational Therapy Treatment Patient Details Name: Susan Calhoun MRN: [id number] DOB: 11/29/1927 Today's Date: 09/09/2014    History of present illness 80 yo wf admitted on 09/01/14 with MS changes. Initial CT showed some ventriculomegaly. Was being treated for UTI. MS changes worsened and an MRI was performed revelaed IVH, Ventricular drain placed on 09/04/14.   OT comments  Ventricular drain clamped for session. Session focused on bed mobility, following commands and sitting tolerance and trunk control sitting EOB. Increased alertness sitting EOB and initially upon return to supine. Not following commands today. Using BUE to "prop" self while sitting EOB. Appeared to shift wieght anteriorly as if to shift weight or in attempt to stand. Vitals stable throughout session. Pt left in chair position. Nsg notified. Will continue to follow to address established goals and facilitate D/C to next venue of care. Will plan to get pt OOB next session if appropriate.   Follow Up Recommendations  SNF;Supervision/Assistance - 24 hour    Equipment Recommendations  Other (comment) (TBD at SNF)    Recommendations for Other Services      Precautions / Restrictions Precautions Precautions: Fall;Other (comment) Precaution Comments: ventricular drain (clamped prior to session) Restrictions Weight Bearing Restrictions: No       Mobility Bed Mobility Overal bed mobility: Needs Assistance;+2 for physical assistance Bed Mobility: Supine to Sit;Sit to Supine     Supine to sit: +2 for physical assistance;Total assist Sit to supine: +2 for physical assistance;Total assist   General bed mobility comments: no initiation of movement. Moving BUE - attempts to pull at lines/tubes Transfers                 General transfer comment: not assessed    Balance Overall balance assessment: Needs assistance Sitting-balance support: Bilateral upper extremity supported;Feet supported Sitting  balance-Leahy Scale: Poor Sitting balance - Comments: Pt able to support self at midline sitting EOB @ 1-2  min with BUE before fatigued. Began to shift anteriorly as if to adjust position or ? Attempt to stand. Unable to self correct posture to upright position.                           ADL Overall ADL's : Needs assistance/impaired                                       General ADL Comments: Total assist for all ADL.Pt attempting to reach for face with L UE. guarded due to panda/multiple lines/tubes      Vision                 Additional Comments: brief periods of eyes open. Appeaers to demonstrate R gaze preference   Perception     Praxis  poor initiation    Cognition   Behavior During Therapy: Flat affect Overall Cognitive Status: Impaired/Different from baseline Area of Impairment: Attention;Following commands                General Comments: opened eyes in supine; no tracking of target; mgroans - no vocalizations    Extremity/Trunk Assessment               Exercises Other Exercises Other Exercises: BUE PROM in sitting   Shoulder Instructions       General Comments      Pertinent Vitals/ Pain       Pain Assessment:  Faces Faces Pain Scale: Hurts a little bit Pain Location: unable to detect Pain Descriptors / Indicators: Grimacing Pain Intervention(s): Monitored during session;Repositioned  Home Living                                          Prior Functioning/Environment              Frequency Min 2X/week     Progress Toward Goals  OT Goals(current goals can now be found in the care plan section)  Progress towards OT goals: Progressing toward goals  Acute Rehab OT Goals Patient Stated Goal: unable to state OT Goal Formulation: Patient unable to participate in goal setting Time For Goal Achievement: 09/18/14 Potential to Achieve Goals: Fair ADL Goals Additional ADL Goal #1: Pt will  follow one step commands 25% of time. Additional ADL Goal #2: Pt will participate in therapeutic activities with her eyes open x 5 minutes. Additional ADL Goal #3: Pt will sit EOB with moderate assistance x 5 minutes as precursor to ADL. Additional ADL Goal #4: Pt will demonstrate purposeful UE use by performing hand to face with washcloth, tissue, mouth swab.  Plan Discharge plan remains appropriate    Co-evaluation      Reason for Co-Treatment: Complexity of the patient's impairments (multi-system involvement);Necessary to address cognition/behavior during functional activity;For patient/therapist safety PT goals addressed during session: Mobility/safety with mobility OT goals addressed during session: ADL's and self-care      End of Session     Activity Tolerance Patient limited by lethargy   Patient Left in bed;with call bell/phone within reach;with restraints reapplied;Other (comment) (in chair position)   Nurse Communication Mobility status;Precautions;Other (comment) (chair position)        Time: [id number] OT Time Calculation (min): 25 min  Charges: OT General Charges $OT Visit: 1 Procedure OT Treatments $Therapeutic Activity: 8-22 mins  Yalissa Fink,HILLARY 09/09/2014, 3:56 PM   [facility] [place], OTR/L  [phone] 09/09/2014

## 2014-09-09 NOTE — Progress Notes (Signed)
PULMONARY / CRITICAL CARE MEDICINE - consult   Name: Susan Calhoun MRN: [id number] DOB: 10/12/1928    ADMISSION DATE:  09/01/2014 CONSULTATION DATE:  10/14   REFERRING MD :  Elisabeth Pigeonevine   CHIEF COMPLAINT:  Acute encephalopathy   INITIAL PRESENTATION:   80 y.o. female with PMH of dm, AF (on rate cntl and coumadin). Admitted 09/01/14 with working dx of UTI and acute encephalopathy. CT head Patient was started on empiric Levaquin for possible urinary tract infection. CT head was negative on admit. Had no improvement so MRI obtained on 10/14 showing:  worsening hydrocephalus and layering material in the occipital horns of the lateral ventricles w/ question of hemorrhage raising concern for meningitis. Transferred to [facility] for further evalation.   STUDIES:  CT head 10/14: Mild, likely age-appropriate, centralized volume loss without acute intracranial process MR brain 10/14: Acute hydrocephalus, worsened since 3 days ago. Layering material in the occipital horns of lateral ventricles, at least partly hemorrhagic. Question focal thrombus in the atrium of the right lateral ventricle, possibly associated with the choroid. This could  be the site of hemorrhage. No specific vascular lesion is identified. No intraparenchymal hemorrhage. The differential diagnosis does include meningitis.   SIGNIFICANT EVENTS: 10/12: [facility]  10/14: No improvement. MR brain worrisome for worsening hydrocephalus and possibly meningitis. [facility]   10/14- coags corrected and drain IVC placed, high pressure noted 10/15 R frontal IVC placement [name]([place])  SUBJECTIVE:   Low gr fever No obvious pain Answers yes- no to questions  VITAL SIGNS: Temp:  [99 F (37.2 C)-100.9 F (38.3 C)] 99 F (37.2 C) (10/20 1129) Pulse Rate:  [73-121] 73 (10/20 1200) Resp:  [17-26] 20 (10/20 1200) BP: (118-175)/(35-122) 154/59 mmHg (10/20 1200) SpO2:  [95 %-99 %] 99 % (10/20 1200) HEMODYNAMICS:    VENTILATOR SETTINGS:   INTAKE / OUTPUT:  Intake/Output Summary (Last 24 hours) at 09/09/14 1247 Last data filed at 09/09/14 1200  Gross per 24 hour  Intake 2234.17 ml  Output   1425 ml  Net 809.17 ml   PHYSICAL EXAMINATION: General: RASS -1, + F/C Neuro: MAEs HEENT: IVC drain in place Cardiovascular: IR, no M Lungs:  Clear Abdomen:  Soft, BS wnl, no r/g Ext:  No edema  LABS: I have reviewed all of today's lab results. Relevant abnormalities are discussed in the A/P section  CBC  Recent Labs Lab 09/06/14 0500 09/07/14 0255 09/09/14 0500  WBC 10.8* 12.2* 11.3*  HGB 9.9* 10.8* 9.6*  HCT 30.6* 33.6* 29.9*  PLT 149* 143* 163   Coag's  Recent Labs Lab 09/03/14 2245 09/04/14 0243 09/05/14 0545 09/06/14 0500  APTT 30  --   --   --   INR 1.46 1.22 1.11 1.11   BMET  Recent Labs Lab 09/06/14 0500 09/07/14 0255 09/09/14 0500  NA 150* 149* 143  K 3.1* 4.2 3.7  CL 117* 118* 108  CO2 23 19 24   BUN 40* 42* 38*  CREATININE 1.28* 1.33* 1.26*  GLUCOSE 164* 156* 151*   Electrolytes  Recent Labs Lab 09/05/14 0545 09/06/14 0500 09/07/14 0255 09/09/14 0500  CALCIUM 8.4 7.5* 7.7* 7.9*  MG 2.0 2.0  --   --   PHOS 2.6 2.4  --   --    Sepsis Markers No results found for this basename: LATICACIDVEN, PROCALCITON, O2SATVEN,  in the last 168 hours ABG  Recent Labs Lab 09/03/14 1738  PHART 7.465*  PCO2ART 27.3*  PO2ART 90.8   Liver  Enzymes No results found for this basename: AST, ALT, ALKPHOS, BILITOT, ALBUMIN,  in the last 168 hours Cardiac Enzymes No results found for this basename: TROPONINI, PROBNP,  in the last 168 hours Glucose  Recent Labs Lab 09/08/14 1552 09/08/14 1940 09/08/14 2331 09/09/14 0313 09/09/14 0740 09/09/14 1128  GLUCAP 164* 171* 169* 180* 160* 189*    Imaging Ct Head Wo Contrast  09/08/2014   CLINICAL DATA:  Altered mental status with acute encephalopathy and somnolence. Hydrocephalus on recent imaging.  EXAM: CT HEAD  WITHOUT CONTRAST  TECHNIQUE: Contiguous axial images were obtained from the base of the skull through the vertex without intravenous contrast.  COMPARISON:  Head CT 09/04/2014  FINDINGS: Images are mildly to moderately degraded by motion artifact.  Right frontal approach ventriculostomy catheter remains in place terminating in the third ventricle, unchanged. Dilatation of the third ventricle has not significantly changed, measuring approximately 1.5 cm in diameter. Slightly increased dilatation of the temporal horns of the lateral ventricles is questioned, however apparent differences may be due to differences in angulation compared with the prior CT. The temporal horns do not appear significantly changed in size compared to the 09/03/2014 MRI. Small amount of blood layering in the occipital horns of lateral ventricles does not appear significantly changed. Trace subarachnoid blood is suspected in a right parietal sulcus (series 2, image 18). There is also likely a small amount of subarachnoid blood in the posterior aspects of the right greater than left sylvian fissures. There is no evidence of acute large territory cortical infarct. There is no midline shift or mass.  Prior bilateral cataract extraction is noted. There is minimal right and mild left maxillary sinus mucosal thickening. Mild sphenoid sinus mucosal thickening is also noted. Mastoid air cells are clear. Carotid siphon calcification is noted. Enteric tube is partially visualized.  IMPRESSION: 1. Unchanged dilatation of the third ventricle and likely no significant interval change in dilatation of the lateral ventricles. 2. Unchanged intraventricular blood. 3. Small volume of subarachnoid blood.   Electronically Signed   By: Sebastian Ache   On: 09/08/2014 11:02   ASSESSMENT / PLAN:  NEUROLOGIC A:   Hydrocephalus s/p ventricular drain -unclear cause P:   Further eval and mgmt per NS Neuro consulted   PULMONARY OETT A: Air way protection ok  for now P:   Supp O2 Monitor in ICU  CARDIOVASCULAR A:  AF, CVR -INR 1.5 on admit P:  Not candidate for [facility]  RENAL A:  Hypernatremia Hypokalemia P:   Monitor BMET intermittently Monitor I/Os Correct electrolytes as indicated Dc  d5w   GASTROINTESTINAL A:   Dysphagia P:   SUP: N/I Cont TFs Evaluate swallow at some point -not ready yet  HEMATOLOGIC A:   Coumadin induced coagulapthy- resolved P:  DVT px: SQ heparin Monitor CBC intermittently Transfuse per usual ICU guidelines  INFECTIOUS A:   UTI  No evidence meningitis Unlikely encephalitis  Minimal suspicion for bacterial component P:   BCx2 10/12>> NEG UC 10/12: neg  CSF with WBC 168, neutros 85, lymphs 7 ' CSF cx >> NEG  Abx: CTX, start date 10/14, d/c ABX vanc start date 10/14 d/c abx ampicillin date 10/14 d/c Acyclovir start date 10/14>>>10/15  ENDOCRINE A:   DM  2 P:   Cont SSI  Family Update - 10/19 grand daughter  TODAY'S SUMMARY: Remains with fluctuant mental status s/p v stomy    The patient is critically ill with multiple organ systems failure and requires high complexity decision  making for assessment and support, frequent evaluation and titration of therapies, application of advanced monitoring technologies and extensive interpretation of multiple databases. Critical Care Time devoted to patient care services described in this note is 32 minutes.    Cyril Mourningakesh Rosamary Boudreau MD. Tonny BollmanFCCP. [place] Pager [phone] If no response call [phone]

## 2014-09-10 DIAGNOSIS — I615 Nontraumatic intracerebral hemorrhage, intraventricular: Secondary | ICD-10-CM

## 2014-09-10 DIAGNOSIS — I482 Chronic atrial fibrillation: Secondary | ICD-10-CM

## 2014-09-10 DIAGNOSIS — G9340 Encephalopathy, unspecified: Secondary | ICD-10-CM

## 2014-09-10 LAB — BASIC METABOLIC PANEL
Anion gap: 11 (ref 5–15)
BUN: 35 mg/dL — ABNORMAL HIGH (ref 6–23)
CO2: 24 mEq/L (ref 19–32)
Calcium: 7.8 mg/dL — ABNORMAL LOW (ref 8.4–10.5)
Chloride: 109 mEq/L (ref 96–112)
Creatinine, Ser: 1.21 mg/dL — ABNORMAL HIGH (ref 0.50–1.10)
GFR, EST AFRICAN AMERICAN: 46 mL/min — AB (ref >90)
GFR, EST NON AFRICAN AMERICAN: 40 mL/min — AB (ref >90)
Glucose, Bld: 150 mg/dL — ABNORMAL HIGH (ref 70–99)
POTASSIUM: 3.8 meq/L (ref 3.7–5.3)
SODIUM: 144 meq/L (ref 137–147)

## 2014-09-10 LAB — GLUCOSE, CAPILLARY
GLUCOSE-CAPILLARY: 118 mg/dL — AB (ref 70–99)
GLUCOSE-CAPILLARY: 134 mg/dL — AB (ref 70–99)
Glucose-Capillary: 148 mg/dL — ABNORMAL HIGH (ref 70–99)
Glucose-Capillary: 157 mg/dL — ABNORMAL HIGH (ref 70–99)
Glucose-Capillary: 122 mg/dL — ABNORMAL HIGH (ref 70–99)
Glucose-Capillary: 145 mg/dL — ABNORMAL HIGH (ref 70–99)

## 2014-09-10 LAB — CBC
HCT: 28.7 % — ABNORMAL LOW (ref 36.0–46.0)
Hemoglobin: 9.4 g/dL — ABNORMAL LOW (ref 12.0–15.0)
MCH: 30.7 pg (ref 26.0–34.0)
MCHC: 32.8 g/dL (ref 30.0–36.0)
MCV: 93.8 fL (ref 78.0–100.0)
PLATELETS: 188 10*3/uL (ref 150–400)
RBC: 3.06 MIL/uL — AB (ref 3.87–5.11)
RDW: 17.2 % — ABNORMAL HIGH (ref 11.5–15.5)
WBC: 10.2 10*3/uL (ref 4.0–10.5)

## 2014-09-10 NOTE — Progress Notes (Signed)
STROKE TEAM PROGRESS NOTE   HISTORY Susan Calhoun is an 80 y.o. female who was noted by family to be acting confused > 2 weeks prior. Patient was brought to the hospital where she was diagnosed with a UTI. She was given Rocephin and sent home on Keflex. Due to not being awake enough to take her medications she was brought back to the hospital. MRI brain showed "layering material in the occipital horns of lateral ventricles, at least partly hemorrhagic. Question focal thrombus in the atrium of the right lateral ventricle, possibly associated with the choroid. This could be the site of hemorrhage". Infectious disease was consulted and they do not feel this is bacterial meningoencephalitis and ABX were D/C'd. Neuro surgery placed a Ventriculostomy drain for HCP. Since drain placement her mentation has not fully resolved. Repeat head CT shows no worsening of HCP or blood products. Neurology was asked to further evaluate. At time of evaluation, patient intubated on the vent, Temp 100.8, WBC 12.2. CSF: WBC 168, red blood cells 70000, lymph 7, neutrophile 85, glucose 78, protein 139   SUBJECTIVE (INTERVAL HISTORY) No family is at the bedside. Patient remains somnolent and can be barely arouse to follow few questions. Patient's neurological exam has remained unchanged despite ventriculostomy as well as raising the pop off  to 20 cm neurosurgeon and plans to repeat CT head in a.m. to look for any change in ventricular size and discontinue the ventriculostomy  OBJECTIVE Temp:  [98.8 F (37.1 C)-100.2 F (37.9 C)] 100.2 F (37.9 C) (10/21 1134) Pulse Rate:  [27-106] 81 (10/21 1100) Cardiac Rhythm:  [-] Atrial fibrillation (10/21 0800) Resp:  [18-39] 24 (10/21 1100) BP: (105-165)/(27-81) 157/40 mmHg (10/21 1100) SpO2:  [94 %-100 %] 97 % (10/21 1100)   Recent Labs Lab 09/09/14 2021 09/09/14 2312 09/10/14 0317 09/10/14 0747 09/10/14 1129  GLUCAP 143* 175* 118* 148* 157*    Recent Labs Lab  09/04/14 0243 09/05/14 0545 09/06/14 0500 09/07/14 0255 09/09/14 0500 09/10/14 0500  NA 147 149* 150* 149* 143 144  K 3.2* 3.3* 3.1* 4.2 3.7 3.8  CL 111 113* 117* 118* 108 109  CO2 22 23 23 19 24 24   GLUCOSE 152* 152* 164* 156* 151* 150*  BUN 35* 31* 40* 42* 38* 35*  CREATININE 1.27* 1.21* 1.28* 1.33* 1.26* 1.21*  CALCIUM 8.4 8.4 7.5* 7.7* 7.9* 7.8*  MG  --  2.0 2.0  --   --   --   PHOS  --  2.6 2.4  --   --   --    No results found for this basename: AST, ALT, ALKPHOS, BILITOT, PROT, ALBUMIN,  in the last 168 hours  Recent Labs Lab 09/05/14 0545 09/06/14 0500 09/07/14 0255 09/09/14 0500 09/10/14 0500  WBC 11.6* 10.8* 12.2* 11.3* 10.2  HGB 11.7* 9.9* 10.8* 9.6* 9.4*  HCT 36.3 30.6* 33.6* 29.9* 28.7*  MCV 91.7 91.3 94.1 92.0 93.8  PLT 171 149* 143* 163 188   No results found for this basename: CKTOTAL, CKMB, CKMBINDEX, TROPONINI,  in the last 168 hours No results found for this basename: LABPROT, INR,  in the last 72 hours No results found for this basename: COLORURINE, APPERANCEUR, LABSPEC, PHURINE, GLUCOSEU, HGBUR, BILIRUBINUR, KETONESUR, PROTEINUR, UROBILINOGEN, NITRITE, LEUKOCYTESUR,  in the last 72 hours     Component Value Date/Time   CHOL 204* 09/02/2014 0530   TRIG 149 09/02/2014 0530   HDL 64 09/02/2014 0530   CHOLHDL 3.2 09/02/2014 0530   VLDL 30 09/02/2014 0530  LDLCALC 110* 09/02/2014 0530   Lab Results  Component Value Date   HGBA1C 5.6 09/01/2014   No results found for this basename: labopia,  cocainscrnur,  labbenz,  amphetmu,  thcu,  labbarb    No results found for this basename: ETH,  in the last 168 hours    Ct Head Wo Contrast 09/08/2014  1. Unchanged dilatation of the third ventricle and likely no significant interval change in dilatation of the lateral ventricles. 2. Unchanged intraventricular blood. 3. Small volume of subarachnoid blood.    09/05/2014   1. Interval placement of right frontal approach ventriculostomy catheter with tip in  the third ventricle. Ventricles remain dilated but are slightly decreased in size relative to previous MRI from 09/03/2014. 2. Layering hemorrhage within the occipital horns of the lateral ventricles bilaterally, stable from prior. 3. Question trace acute subarachnoid hemorrhage within the right sylvian fissure.     08/31/2014  Mild, likely age-appropriate, centralized volume loss without acute intracranial process.     [name] Contrast 09/03/2014    Acute hydrocephalus, worsened since 3 days ago. Layering material in the occipital horns of lateral ventricles, at least partly hemorrhagic. Question focal thrombus in the atrium of the right lateral ventricle, possibly associated with the choroid. This could be the site of hemorrhage. No specific vascular lesion is identified. No intraparenchymal hemorrhage. The differential diagnosis does include meningitis.    Dg Chest Port 1 View 09/06/2014   Feeding tube with weighted tip in stomach.   09/04/2014    New left jugular central venous catheter tip overlies the distal SVC. No evidence of pneumothorax.  Stable cardiomegaly.  No active lung disease.     Dg Chest 2 View 08/31/2014    Borderline cardiomegaly and lung hyperexpansion without acute cardiopulmonary disease.     PHYSICAL EXAM Frail elderly Caucasian lady not in distress. . Afebrile. Head is nontraumatic. Neck is supple without bruit.  Cardiac exam no murmur or gallop. Lungs are clear to auscultation. Distal pulses are well felt. Neurological Exam : Drowsy. Barely opens eyes to stimuli. Pupils irregular but reactive. Slight disconjugate gaze with left eye hypotropia. Roving eye movements present. Does not blink to threat on either side. Fundi could not be visualized. Face is symmetric. Tongue is midline. Moves all 4 extremities purposefully against gravity but mild left sided weakness more in the arm than the leg. Left plantar upgoing right equivocal. Sensation and coordination cannot be  reliably tested. Gait was not tested.  ASSESSMENT/PLAN Susan Calhoun is a 80 y.o. female with history of altered mental status following UTI, seen in the ED 08/31/2014 - admitted 09/01/2014 with confusion. Patient was on coumadin at that time for atrial fibrillation. INR no checked. Administered Levaquin. BP elevated on the 11th at 166/44, max BP next day 189/112. In retrospect, initial CT with L IVH. Repeat CT done during workup found to have worsening of IVH with bilateral occipital intraventricular blood with hydrocephalus. Hypertension and coumadin associated coagulapathy etiology of hemorrhage.   Stroke:  Non-dominant IVH secondary to coumadin associated coagulopathy in setting of uncontrolled hypertension and levaquin use.   Intraventricular drain placed with no improvement of mental status.  CSF without source of infection.   SCDs for VTE prophylaxis   NPO  Up with assistance  warfarin prior to admission  Ongoing aggressive risk factor management  Resultant confusion, unable to follow commands  Therapy recommendations:  SNF  Disposition:  SNF  Atrial Fibrillation  Home meds:  Warfarin prior  to admission  INR 1.51 on admission 10/12. Not checked 10/11 when seen in ED  No longer an anticoagulation candidate secondary to hemorrhage   Hypertension  Stable  Hyperlipidemia  Home meds:  lipitor 80 resumed in hospital  LDL 110 goal < 70  Continue statin at discharge  Diabetes  HgbA1c 5.6 goal < 7.0  Controlled  Other Stroke Risk Factors Advanced age   Obesity, Body mass index is 26.97 kg/(m^2).    Coronary artery disease - s/p CABG ICA stenosis s/p CEA 2015 PVD  Other Active Problems  UTI with pyelonephritis  CKD   Hospital day # 9  [facility] BIBY, MSN, RN, ANVP-BC, ANP-BC, [facility] Pager: [phone] 09/10/2014 2:49 PM  This patient is critically ill and at significant risk of neurological worsening, death and care  requires constant monitoring of vital signs, hemodynamics,respiratory and cardiac monitoring,review of multiple databases, neurological assessment, discussion with family, other specialists and medical decision making of high complexity.I have made any additions or clarifications directly to the above note.  I spent 30 minutes of neurocritical care time  in the care of  this patient. I have personally examined this patient, reviewed notes, independently viewed imaging studies, participated in medical decision making and plan of care. I have made any additions or clarifications directly to the above note. Agree with note above.   Delia Heady, MD Medical Director [facility] Pager: [phone] 09/10/2014 2:49 PM    To contact Stroke Continuity provider, please refer to [email or web address]. After hours, contact General Neurology

## 2014-09-10 NOTE — Progress Notes (Signed)
NUTRITION FOLLOW-UP  INTERVENTION:  Continue Vital AF 1.2 @ 45 ml/hr  Provides 1296 kcal (99% of her minimum needs), 81 gm protein, 876 ml free water daily.  NUTRITION DIAGNOSIS: Inadequate oral intake related to inability to eat as evidenced by NPO status; ongoing.   Goal: Intake to meet >90% of estimated nutrition needs, met.  Monitor:  TF tolerance/adequacy, weight trend, labs  ASSESSMENT: Admitted to [facility] on 09/01/14 with working dx of UTI and acute encephalopathy. CT head was negative on admit. Had no improvement so MRI obtained on 10/14 showing: worsening hydrocephalus and layering material in the occipital horns of the lateral ventricles w/ question of hemorrhage raising concern for meningitis. Transferred to [facility] for further evaluation.   Vital AF 1.2 @ 45 ml/hr providing: 1296 kcal and 81 grams protein which is meeting needs at this time. Feeding tube with tip in stomach at pylorus per xray.   BUN/Cr elevated CBG's: 118-157  Height: Ht Readings from Last 1 Encounters:  09/01/14 _0  (1.499 m)    Weight: Wt Readings from Last 1 Encounters:  09/08/14 133 lb 9.6 oz (60.6 kg)  Weight trends: 121-142 lb  BMI:  Body mass index is 26.97 kg/(m^2).  Estimated Nutritional Needs: Kcal: 1300-1500 Protein: 65-80 gm Fluid: 1.5 L  Skin: ecchymosis left hip, rash right buttocks  Diet Order:     Intake/Output Summary (Last 24 hours) at 09/10/14 1446 Last data filed at 09/10/14 1200  Gross per 24 hour  Intake   1440 ml  Output    897 ml  Net    543 ml    Last BM: 10/21 - diarrhea (c.diff ruled out) Rectal pouch placed 10/18 10/20: 200 ml  Labs:   Recent Labs Lab 09/04/14 0243 09/05/14 0545 09/06/14 0500 09/07/14 0255 09/09/14 0500 09/10/14 0500  NA 147 149* 150* 149* 143 144  K 3.2* 3.3* 3.1* 4.2 3.7 3.8  CL 111 113* 117* 118* 108 109  CO2 _1 BUN 35* 31* 40* 42* 38* 35*  CREATININE 1.27* 1.21* 1.28* 1.33* 1.26* 1.21*  CALCIUM 8.4 8.4  7.5* 7.7* 7.9* 7.8*  MG  --  2.0 2.0  --   --   --   PHOS  --  2.6 2.4  --   --   --   GLUCOSE 152* 152* 164* 156* 151* 150*    CBG (last 3)   Recent Labs  09/10/14 0317 09/10/14 0747 09/10/14 1129  GLUCAP 118* 148* 157*    Scheduled Meds: . amiodarone  200 mg Per Tube Daily  . antiseptic oral rinse  7 mL Mouth Rinse q12n4p  . atorvastatin  80 mg Per Tube QPC supper  . chlorhexidine  15 mL Mouth Rinse BID  . free water  200 mL Per Tube 3 times per day  . insulin aspart  0-15 Units Subcutaneous 6 times per day  . latanoprost  1 drop Right Eye QHS  . metoprolol tartrate  25 mg Per Tube BID    Continuous Infusions: . feeding supplement (VITAL AF 1.2 CAL) 1,000 mL (09/09/14 2039)   Maylon Peppers RD, [place], [place] Pager [phone] After Hours Pager

## 2014-09-10 NOTE — Progress Notes (Signed)
PULMONARY / CRITICAL CARE MEDICINE - consult   Name: Jamesetta SoGiuseppina Skipper MRN: [id number] DOB: 07/25/1928    ADMISSION DATE:  09/01/2014 CONSULTATION DATE:  10/14   REFERRING MD :  Elisabeth Pigeonevine   CHIEF COMPLAINT:  Acute encephalopathy   INITIAL PRESENTATION:   80 y.o. female with PMH of dm, AF (on rate cntl and coumadin). Admitted 09/01/14 with working dx of UTI and acute encephalopathy. CT head Patient was started on empiric Levaquin for possible urinary tract infection. CT head was negative on admit. Had no improvement so MRI obtained on 10/14 showing:  worsening hydrocephalus and layering material in the occipital horns of the lateral ventricles w/ question of hemorrhage raising concern for meningitis. Transferred to [facility] for further evalation.   STUDIES:  CT head 10/14: Mild, likely age-appropriate, centralized volume loss without acute intracranial process MR brain 10/14: Acute hydrocephalus, worsened since 3 days ago. Layering material in the occipital horns of lateral ventricles, at least partly hemorrhagic. Question focal thrombus in the atrium of the right lateral ventricle, possibly associated with the choroid. This could  be the site of hemorrhage. No specific vascular lesion is identified. No intraparenchymal hemorrhage. The differential diagnosis does include meningitis.   SIGNIFICANT EVENTS: 10/12: [facility]  10/14: No improvement. MR brain worrisome for worsening hydrocephalus and possibly meningitis. [facility]   10/14- coags corrected and drain IVC placed, high pressure noted 10/15 R frontal IVC placement [name]([place])  SUBJECTIVE:   afebrile No obvious pain  VITAL SIGNS: Temp:  [98.8 F (37.1 C)-100.1 F (37.8 C)] 98.8 F (37.1 C) (10/21 0748) Pulse Rate:  [27-106] 88 (10/21 0900) Resp:  [18-39] 24 (10/21 0900) BP: (105-177)/(27-81) 134/44 mmHg (10/21 0900) SpO2:  [94 %-100 %] 97 % (10/21 0900) HEMODYNAMICS:   VENTILATOR SETTINGS:    INTAKE / OUTPUT:  Intake/Output Summary (Last 24 hours) at 09/10/14 1024 Last data filed at 09/10/14 0900  Gross per 24 hour  Intake   1535 ml  Output    977 ml  Net    558 ml   PHYSICAL EXAMINATION: General: RASS -1, + F/C Neuro: MAEs, monosyllables, not oriented, int folows commands HEENT: IVC drain in place Cardiovascular: IR, no M Lungs:  Clear Abdomen:  Soft, BS wnl, no r/g Ext:  No edema  LABS: I have reviewed all of today's lab results. Relevant abnormalities are discussed in the A/P section  CBC  Recent Labs Lab 09/07/14 0255 09/09/14 0500 09/10/14 0500  WBC 12.2* 11.3* 10.2  HGB 10.8* 9.6* 9.4*  HCT 33.6* 29.9* 28.7*  PLT 143* 163 188   Coag's  Recent Labs Lab 09/03/14 2245 09/04/14 0243 09/05/14 0545 09/06/14 0500  APTT 30  --   --   --   INR 1.46 1.22 1.11 1.11   BMET  Recent Labs Lab 09/07/14 0255 09/09/14 0500 09/10/14 0500  NA 149* 143 144  K 4.2 3.7 3.8  CL 118* 108 109  CO2 19 24 24   BUN 42* 38* 35*  CREATININE 1.33* 1.26* 1.21*  GLUCOSE 156* 151* 150*   Electrolytes  Recent Labs Lab 09/05/14 0545 09/06/14 0500 09/07/14 0255 09/09/14 0500 09/10/14 0500  CALCIUM 8.4 7.5* 7.7* 7.9* 7.8*  MG 2.0 2.0  --   --   --   PHOS 2.6 2.4  --   --   --    Sepsis Markers No results found for this basename: LATICACIDVEN, PROCALCITON, O2SATVEN,  in the last 168 hours ABG  Recent Labs Lab  09/03/14 1738  PHART 7.465*  PCO2ART 27.3*  PO2ART 90.8   Liver Enzymes No results found for this basename: AST, ALT, ALKPHOS, BILITOT, ALBUMIN,  in the last 168 hours Cardiac Enzymes No results found for this basename: TROPONINI, PROBNP,  in the last 168 hours Glucose  Recent Labs Lab 09/09/14 1128 09/09/14 1548 09/09/14 2021 09/09/14 2312 09/10/14 0317 09/10/14 0747  GLUCAP 189* 189* 143* 175* 118* 148*    Imaging Dg Abd Portable 1v  09/09/2014   CLINICAL DATA:  80 year old female, status post enteric tube placement.  EXAM:  PORTABLE ABDOMEN - 1 VIEW  COMPARISON:  Plain film 09/04/2014  FINDINGS: Limited view the abdomen with exclusion of portions of the right abdomen.  Metallic tip enteric feeding tube projects over the lower mediastinum, following the greater curvature of the stomach, and terminating near the pylorus. Coaxial wire remains in place.  Unremarkable bowel gas pattern with gas within colon. No significant small bowel gas.  Surgical changes at the left lower chest, as well as changes of prior median sternotomy.  Extensive vascular calcifications.  Multilevel degenerative disc disease.  IMPRESSION: Limited plain film of the abdomen demonstrates metallic tip enteric feeding tube in place, with the tip terminating near the gastric antrum/pylorus.  Signed,  Yvone NeuJaime S. Loreta AveWagner, DO  [facility]  [facility]   Electronically Signed   By: Gilmer MorJaime  Wagner D.O.   On: 09/09/2014 16:46   ASSESSMENT / PLAN:  NEUROLOGIC A:   Hydrocephalus s/p ventricular drain -unclear cause P:   Neuro following - raising ventric with plan to dc eventual   PULMONARY  A: Air way protection ok for now P:   Supp O2 Monitor in ICU  CARDIOVASCULAR A:  AF, CVR -INR 1.5 on admit P:  Not candidate for [facility]  RENAL A:  Hypernatremia Hypokalemia P:   Monitor BMET intermittently Monitor I/Os Correct electrolytes as indicated Dc  d5w   GASTROINTESTINAL A:   Dysphagia P:   SUP: N/I Cont TFs Evaluate swallow   HEMATOLOGIC A:   Coumadin induced coagulapthy- resolved P:  DVT px: SQ heparin Monitor CBC intermittently Transfuse per usual ICU guidelines  INFECTIOUS A:   UTI  No evidence meningitis Unlikely encephalitis  Minimal suspicion for bacterial component P:   BCx2 10/12>> NEG UC 10/12: neg  CSF with WBC 168, neutros 85, lymphs 7 ' CSF cx >> NEG  Abx: CTX, start date 10/14, d/c ABX vanc start date 10/14 d/c abx ampicillin date 10/14 d/c Acyclovir start date  10/14>>>10/15  ENDOCRINE A:   DM  2 P:   Cont SSI  Family Updated - 10/21 grand daughter  TODAY'S SUMMARY: Remains with fluctuant mental status s/p v stomy    The patient is critically ill with multiple organ systems failure and requires high complexity decision making for assessment and support, frequent evaluation and titration of therapies, application of advanced monitoring technologies and extensive interpretation of multiple databases.    Cyril Mourningakesh Marcelino Campos MD. Tonny BollmanFCCP. [facility] Pager [phone] If no response call [phone]

## 2014-09-10 NOTE — Progress Notes (Signed)
Patient ID: Susan Calhoun, female   DOB: 12/24/1927, 80 y.o.   MRN: [id number] There has been no change in her neurologic exam despite raising the ventriculostomy drain to 20 cm and limiting the amount of drainage. She remains lethargic but opens her eyes spontaneously. She will occasionally follow simple commands. She continues to move all extremities. Today she actually nodded her head yes to a simple question.   I remain unconvinced hydrocephalus was her reason for her somnolence. Her ventriculostomy was not under pressure when it was inserted, her ventricular size has not changed despite drainage, her neurologic exam has not changed despite drainage, her neurologic exam has not changed despite raising the drain and limiting drainage. All of this has been explained to the family in detail.  Today we will clamp the drain and transduce pressures, then repeat the CT scan of her head tomorrow.

## 2014-09-11 ENCOUNTER — Inpatient Hospital Stay (HOSPITAL_COMMUNITY): Payer: Medicare (Managed Care)

## 2014-09-11 LAB — PROTEIN AND GLUCOSE, CSF
Glucose, CSF: 66 mg/dL (ref 43–76)
Total  Protein, CSF: 21 mg/dL (ref 15–45)

## 2014-09-11 LAB — CSF CELL COUNT WITH DIFFERENTIAL
LYMPHS CSF: 59 % (ref 40–80)
MONOCYTE-MACROPHAGE-SPINAL FLUID: 19 % (ref 15–45)
RBC Count, CSF: 660 /mm3 — ABNORMAL HIGH
SEGMENTED NEUTROPHILS-CSF: 22 % — AB (ref 0–6)
WBC, CSF: 14 /mm3 (ref 0–5)

## 2014-09-11 LAB — BASIC METABOLIC PANEL
ANION GAP: 10 (ref 5–15)
BUN: 34 mg/dL — ABNORMAL HIGH (ref 6–23)
CO2: 24 mEq/L (ref 19–32)
Calcium: 7.7 mg/dL — ABNORMAL LOW (ref 8.4–10.5)
Chloride: 107 mEq/L (ref 96–112)
Creatinine, Ser: 1.21 mg/dL — ABNORMAL HIGH (ref 0.50–1.10)
GFR calc Af Amer: 46 mL/min — ABNORMAL LOW (ref >90)
GFR, EST NON AFRICAN AMERICAN: 40 mL/min — AB (ref >90)
GLUCOSE: 121 mg/dL — AB (ref 70–99)
Potassium: 4 mEq/L (ref 3.7–5.3)
SODIUM: 141 meq/L (ref 137–147)

## 2014-09-11 LAB — GRAM STAIN

## 2014-09-11 LAB — CBC
HCT: 28.4 % — ABNORMAL LOW (ref 36.0–46.0)
Hemoglobin: 9.3 g/dL — ABNORMAL LOW (ref 12.0–15.0)
MCH: 29.9 pg (ref 26.0–34.0)
MCHC: 32.7 g/dL (ref 30.0–36.0)
MCV: 91.3 fL (ref 78.0–100.0)
Platelets: 197 10*3/uL (ref 150–400)
RBC: 3.11 MIL/uL — ABNORMAL LOW (ref 3.87–5.11)
RDW: 17.2 % — AB (ref 11.5–15.5)
WBC: 10.2 10*3/uL (ref 4.0–10.5)

## 2014-09-11 LAB — GLUCOSE, CAPILLARY
GLUCOSE-CAPILLARY: 177 mg/dL — AB (ref 70–99)
Glucose-Capillary: 121 mg/dL — ABNORMAL HIGH (ref 70–99)
Glucose-Capillary: 108 mg/dL — ABNORMAL HIGH (ref 70–99)
Glucose-Capillary: 148 mg/dL — ABNORMAL HIGH (ref 70–99)
Glucose-Capillary: 173 mg/dL — ABNORMAL HIGH (ref 70–99)

## 2014-09-11 MED ORDER — PHENYTOIN SODIUM 50 MG/ML IJ SOLN
100.0000 mg | Freq: Three times a day (TID) | INTRAMUSCULAR | Status: DC
  Administered 2014-09-12 – 2014-09-14 (×7): 100 mg via INTRAVENOUS
  Filled 2014-09-11 (×13): qty 2

## 2014-09-11 MED ORDER — METHYLPHENIDATE HCL 5 MG PO TABS
2.5000 mg | ORAL_TABLET | ORAL | Status: AC
  Administered 2014-09-11: 2.5 mg via ORAL
  Filled 2014-09-11: qty 1

## 2014-09-11 MED ORDER — SODIUM CHLORIDE 0.9 % IV SOLN
1000.0000 mg | Freq: Once | INTRAVENOUS | Status: AC
  Administered 2014-09-11: 1000 mg via INTRAVENOUS
  Filled 2014-09-11: qty 20

## 2014-09-11 NOTE — Progress Notes (Signed)
Patient ID: Susan Calhoun, female   DOB: 08/08/1928, 80 y.o.   MRN: [id number] Subjective: Patient remains unchanged, lethargic but opens eyes to stimuli, localizes, moans  Objective: Vital signs in last 24 hours: Temp:  [98.7 F (37.1 C)-100.2 F (37.9 C)] 98.7 F (37.1 C) (10/22 0751) Pulse Rate:  [30-110] 102 (10/22 0900) Resp:  [18-27] 19 (10/22 0900) BP: (110-157)/(29-104) 125/50 mmHg (10/22 0900) SpO2:  [96 %-100 %] 98 % (10/22 0900)  Intake/Output from previous day: 10/21 0701 - 10/22 0700 In: 1880 [NG/GT:1880] Out: 765 [Urine:575; Drains:5; Stool:185] Intake/Output this shift: Total I/O In: 90 [NG/GT:90] Out: -     Lab Results: Lab Results  Component Value Date   WBC 10.2 09/11/2014   HGB 9.3* 09/11/2014   HCT 28.4* 09/11/2014   MCV 91.3 09/11/2014   PLT 197 09/11/2014   Lab Results  Component Value Date   INR 1.11 09/06/2014   BMET Lab Results  Component Value Date   NA 141 09/11/2014   K 4.0 09/11/2014   CL 107 09/11/2014   CO2 24 09/11/2014   GLUCOSE 121* 09/11/2014   BUN 34* 09/11/2014   CREATININE 1.21* 09/11/2014   CALCIUM 7.7* 09/11/2014    Studies/Results: Ct Head Wo Contrast  09/11/2014   CLINICAL DATA:  Hydrocephalus.  Clamped intraventricular catheter.  EXAM: CT HEAD WITHOUT CONTRAST  TECHNIQUE: Contiguous axial images were obtained from the base of the skull through the vertex without intravenous contrast.  COMPARISON:  CT 09/08/2014  FINDINGS: Right frontal ventricular catheter extends into the third ventricle unchanged in position. Hydrocephalus of the third fourth and lateral ventricles is unchanged. Layering high density blood in the occipital horns bilaterally slightly improved. No new hemorrhage in the ventricles  Small 5 mm hyperdensity in the right parietal lobe may represent some subarachnoid hemorrhage or calcification. This is unchanged over serial exams.  IMPRESSION: Normal ventricular enlargement is stable. Intraventricular  hemorrhage slightly improved. Small area of hemorrhage or calcification in the right parietal lobe is stable.   Electronically Signed   By: Marlan Palauharles  Clark M.D.   On: 09/11/2014 08:02   Dg Abd Portable 1v  09/09/2014   CLINICAL DATA:  80 year old female, status post enteric tube placement.  EXAM: PORTABLE ABDOMEN - 1 VIEW  COMPARISON:  Plain film 09/04/2014  FINDINGS: Limited view the abdomen with exclusion of portions of the right abdomen.  Metallic tip enteric feeding tube projects over the lower mediastinum, following the greater curvature of the stomach, and terminating near the pylorus. Coaxial wire remains in place.  Unremarkable bowel gas pattern with gas within colon. No significant small bowel gas.  Surgical changes at the left lower chest, as well as changes of prior median sternotomy.  Extensive vascular calcifications.  Multilevel degenerative disc disease.  IMPRESSION: Limited plain film of the abdomen demonstrates metallic tip enteric feeding tube in place, with the tip terminating near the gastric antrum/pylorus.  Signed,  Yvone NeuJaime S. Loreta AveWagner, DO  [facility]  [facility]   Electronically Signed   By: Gilmer MorJaime  Wagner D.O.   On: 09/09/2014 16:46    Assessment/Plan: CSF sent for studies, ICP 7-15, CT unchanged re: vent size. May d/c drain once studies back.   LOS: 10 days    Wylie Coon S 09/11/2014, 10:08 AM

## 2014-09-11 NOTE — Progress Notes (Signed)
STROKE TEAM PROGRESS NOTE   HISTORY Susan Calhoun is an 80 y.o. female who was noted by family to be acting confused > 2 weeks prior. Patient was brought to the hospital where she was diagnosed with a UTI. She was given Rocephin and sent home on Keflex. Due to not being awake enough to take her medications she was brought back to the hospital. MRI brain showed "layering material in the occipital horns of lateral ventricles, at least partly hemorrhagic. Question focal thrombus in the atrium of the right lateral ventricle, possibly associated with the choroid. This could be the site of hemorrhage". Infectious disease was consulted and they do not feel this is bacterial meningoencephalitis and ABX were D/C'd. Neuro surgery placed a Ventriculostomy drain for HCP. Since drain placement her mentation has not fully resolved. Repeat head CT shows no worsening of HCP or blood products. Neurology was asked to further evaluate. At time of evaluation, patient intubated on the vent, Temp 100.8, WBC 12.2. CSF: WBC 168, red blood cells 70000, lymph 7, neutrophile 85, glucose 78, protein 139   SUBJECTIVE (INTERVAL HISTORY) No family is at the bedside. Patient remains somnolent and can be barely arouse to follow few questions. Patient's neurological exam has remained unchanged despite ventriculostomy as well as raising the pop off  to 20 cm neurosurgeon and repeat CT head today a.m. Does not show  any change in ventricular size and  Dr Yetta BarreJones to consider discontinuing the ventriculostomy  OBJECTIVE Temp:  [98.7 F (37.1 C)-100.1 F (37.8 C)] 98.7 F (37.1 C) (10/22 1152) Pulse Rate:  [30-110] 76 (10/22 1200) Cardiac Rhythm:  [-] Atrial fibrillation (10/22 0800) Resp:  [17-26] 17 (10/22 1200) BP: (99-157)/(29-104) 99/77 mmHg (10/22 1200) SpO2:  [97 %-100 %] 98 % (10/22 1200)   Recent Labs Lab 09/10/14 1959 09/10/14 2305 09/11/14 0314 09/11/14 0749 09/11/14 1151  GLUCAP 122* 145* 121* 108* 177*     Recent Labs Lab 09/05/14 0545 09/06/14 0500 09/07/14 0255 09/09/14 0500 09/10/14 0500 09/11/14 0500  NA 149* 150* 149* 143 144 141  K 3.3* 3.1* 4.2 3.7 3.8 4.0  CL 113* 117* 118* 108 109 107  CO2 23 23 19 24 24 24   GLUCOSE 152* 164* 156* 151* 150* 121*  BUN 31* 40* 42* 38* 35* 34*  CREATININE 1.21* 1.28* 1.33* 1.26* 1.21* 1.21*  CALCIUM 8.4 7.5* 7.7* 7.9* 7.8* 7.7*  MG 2.0 2.0  --   --   --   --   PHOS 2.6 2.4  --   --   --   --    No results found for this basename: AST, ALT, ALKPHOS, BILITOT, PROT, ALBUMIN,  in the last 168 hours  Recent Labs Lab 09/06/14 0500 09/07/14 0255 09/09/14 0500 09/10/14 0500 09/11/14 0500  WBC 10.8* 12.2* 11.3* 10.2 10.2  HGB 9.9* 10.8* 9.6* 9.4* 9.3*  HCT 30.6* 33.6* 29.9* 28.7* 28.4*  MCV 91.3 94.1 92.0 93.8 91.3  PLT 149* 143* 163 188 197   No results found for this basename: CKTOTAL, CKMB, CKMBINDEX, TROPONINI,  in the last 168 hours No results found for this basename: LABPROT, INR,  in the last 72 hours No results found for this basename: COLORURINE, APPERANCEUR, LABSPEC, PHURINE, GLUCOSEU, HGBUR, BILIRUBINUR, KETONESUR, PROTEINUR, UROBILINOGEN, NITRITE, LEUKOCYTESUR,  in the last 72 hours     Component Value Date/Time   CHOL 204* 09/02/2014 0530   TRIG 149 09/02/2014 0530   HDL 64 09/02/2014 0530   CHOLHDL 3.2 09/02/2014 0530   VLDL  30 09/02/2014 0530   LDLCALC 110* 09/02/2014 0530   Lab Results  Component Value Date   HGBA1C 5.6 09/01/2014   No results found for this basename: labopia,  cocainscrnur,  labbenz,  amphetmu,  thcu,  labbarb    No results found for this basename: ETH,  in the last 168 hours    Ct Head Wo Contrast 09/08/2014  1. Unchanged dilatation of the third ventricle and likely no significant interval change in dilatation of the lateral ventricles. 2. Unchanged intraventricular blood. 3. Small volume of subarachnoid blood.    09/05/2014   1. Interval placement of right frontal approach ventriculostomy  catheter with tip in the third ventricle. Ventricles remain dilated but are slightly decreased in size relative to previous MRI from 09/03/2014. 2. Layering hemorrhage within the occipital horns of the lateral ventricles bilaterally, stable from prior. 3. Question trace acute subarachnoid hemorrhage within the right sylvian fissure.     08/31/2014  Mild, likely age-appropriate, centralized volume loss without acute intracranial process.     [name] Contrast 09/03/2014    Acute hydrocephalus, worsened since 3 days ago. Layering material in the occipital horns of lateral ventricles, at least partly hemorrhagic. Question focal thrombus in the atrium of the right lateral ventricle, possibly associated with the choroid. This could be the site of hemorrhage. No specific vascular lesion is identified. No intraparenchymal hemorrhage. The differential diagnosis does include meningitis.    Dg Chest Port 1 View 09/06/2014   Feeding tube with weighted tip in stomach.   09/04/2014    New left jugular central venous catheter tip overlies the distal SVC. No evidence of pneumothorax.  Stable cardiomegaly.  No active lung disease.     Dg Chest 2 View 08/31/2014    Borderline cardiomegaly and lung hyperexpansion without acute cardiopulmonary disease.     PHYSICAL EXAM Frail elderly Caucasian lady not in distress. . Afebrile. Head is nontraumatic. Neck is supple without bruit.  Cardiac exam no murmur or gallop. Lungs are clear to auscultation. Distal pulses are well felt. Neurological Exam : Drowsy. Barely opens eyes to stimuli.not following commands. mumbles few words only Pupils irregular but reactive. Slight disconjugate gaze with left eye hypotropia. Roving eye movements present. Does not blink to threat on either side. Fundi could not be visualized. Face is symmetric. Tongue is midline. Moves all 4 extremities purposefully against gravity but mild left sided weakness more in the arm than the leg. Left  plantar upgoing right equivocal. Sensation and coordination cannot be reliably tested. Gait was not tested.  ASSESSMENT/PLAN Susan Calhoun is a 81 y.o. female with history of altered mental status following UTI, seen in the ED 08/31/2014 - admitted 09/01/2014 with confusion. Patient was on coumadin at that time for atrial fibrillation. INR no checked. Administered Levaquin. BP elevated on the 11th at 166/44, max BP next day 189/112. In retrospect, initial CT with L IVH. Repeat CT done during workup found to have worsening of IVH with bilateral occipital intraventricular blood with hydrocephalus. Hypertension and coumadin associated coagulapathy etiology of hemorrhage.   Stroke:  Non-dominant IVH secondary to coumadin associated coagulopathy in setting of uncontrolled hypertension and levaquin use.   Intraventricular drain placed with no improvement of mental status.  CSF without source of infection.   SCDs for VTE prophylaxis   NPO  Up with assistance  warfarin prior to admission  Ongoing aggressive risk factor management  Resultant confusion, unable to follow commands mental status disproportionate to amount of IVH  and CSF pressure. Plan trial of Ritalin 2.5 mg. Repeat EEG  Therapy recommendations:  SNF  Disposition:  SNF  Atrial Fibrillation  Home meds:  Warfarin prior to admission  INR 1.51 on admission 10/12. Not checked 10/11 when seen in ED  No longer an anticoagulation candidate secondary to hemorrhage   Hypertension  Stable  Hyperlipidemia  Home meds:  lipitor 80 resumed in hospital  LDL 110 goal < 70  Continue statin at discharge  Diabetes  HgbA1c 5.6 goal < 7.0  Controlled  Other Stroke Risk Factors Advanced age   Obesity, Body mass index is 26.97 kg/(m^2).    Coronary artery disease - s/p CABG ICA stenosis s/p CEA 2015 PVD  Other Active Problems  UTI with pyelonephritis  CKD   Hospital day # 10  Annie MainSHARON BIBY, MSN, RN, ANVP-BC,  ANP-BC, GNP-BC [facility] Pager: [phone] 09/11/2014 2:45 PM  This patient is critically ill and at significant risk of neurological worsening, death and care requires constant monitoring of vital signs, hemodynamics,respiratory and cardiac monitoring,review of multiple databases, neurological assessment, discussion with family, other specialists and medical decision making of high complexity.I have made any additions or clarifications directly to the above note.  I spent 35 minutes of neurocritical care time  in the care of  this patient. I have personally examined this patient, reviewed notes, independently viewed imaging studies, participated in medical decision making and plan of care. I have made any additions or clarifications directly to the above note. Agree with note above.   Delia HeadyPramod Sethi, MD Medical Director [facility] Pager: [phone] 09/11/2014 2:45 PM    To contact Stroke Continuity provider, please refer to [email or web address]. After hours, contact General Neurology

## 2014-09-11 NOTE — Progress Notes (Signed)
Physical Therapy Treatment Patient Details Name: Susan Calhoun MRN: [id number] DOB: 03/20/1928 Today's Date: 09/11/2014    History of Present Illness 80 yo wf admitted on 09/01/14 with MS changes. Initial CT showed some ventriculomegaly. Was being treated for UTI. MS changes worsened and an MRI was performed revelaed IVH, Ventricular drain placed on 09/04/14.    PT Comments    Patient demonstrates no significant improvements during this session. Spoke with nsg, encouraged OOB to chair with nsg via lift.  Will continue to see as indicated and progress as tolerated.  Follow Up Recommendations  SNF;Supervision/Assistance - 24 hour     Equipment Recommendations   (TBD)    Recommendations for Other Services       Precautions / Restrictions Precautions Precaution Comments: ventricular drain Restrictions Weight Bearing Restrictions: No    Mobility  Bed Mobility Overal bed mobility: +2 for physical assistance;Needs Assistance Bed Mobility: Supine to Sit;Sit to Supine     Supine to sit: +2 for physical assistance;Total assist Sit to supine: +2 for physical assistance;Total assist   General bed mobility comments: sat EOB x 5 minutes with total assist; no initiation of movement  Transfers                    Ambulation/Gait                 Stairs            Wheelchair Mobility    Modified Rankin (Stroke Patients Only)       Balance   Sitting-balance support: Bilateral upper extremity supported;Feet supported Sitting balance-Leahy Scale: Poor                              Cognition Arousal/Alertness: Lethargic Behavior During Therapy: Flat affect Overall Cognitive Status: Impaired/Different from baseline Area of Impairment: Attention;Following commands               General Comments: opened eyes momentarily after returned to supine    Exercises General Exercises - Lower Extremity Ankle Circles/Pumps: PROM;Both;5  reps Heel Slides: PROM;Both;5 reps Hip ABduction/ADduction: PROM;Both;5 reps Hip Flexion/Marching: PROM;Both;5 reps    General Comments        Pertinent Vitals/Pain Pain Assessment: Faces Faces Pain Scale: No hurt    Home Living                      Prior Function            PT Goals (current goals can now be found in the care plan section) Acute Rehab PT Goals Patient Stated Goal: unable to state PT Goal Formulation: Patient unable to participate in goal setting Time For Goal Achievement: 09/18/14 Potential to Achieve Goals: Fair Progress towards PT goals: Not progressing toward goals - comment    Frequency  Min 2X/week    PT Plan Current plan remains appropriate    Co-evaluation             End of Session   Activity Tolerance: Patient limited by fatigue;Patient limited by lethargy Patient left: in bed;with call bell/phone within reach;with bed alarm set (in chair position)     Time: [id number] PT Time Calculation (min): 18 min  Charges:  $Therapeutic Activity: 8-22 mins                    G CodesFabio Calhoun:      Susan Calhoun J 09/11/2014, 4:32 PM  Susan Calhoun, [place] DPT  [phone]

## 2014-09-11 NOTE — Procedures (Signed)
ELECTROENCEPHALOGRAM REPORT   Patient: Susan Calhoun       Room #: 1O103M12 EEG No. ID: [id number] Age: 80 y.o.        Sex: female Referring Physician: Pearlean BrownieSethi Report Date:  09/11/2014        Interpreting Physician: Thana FarrEYNOLDS,Nickolette Espinola D  History: Susan Calhoun is an 80 y.o. female with altered mental status s/p IVH and intraventricular drain placement  Medications:  Scheduled: . amiodarone  200 mg Per Tube Daily  . antiseptic oral rinse  7 mL Mouth Rinse q12n4p  . atorvastatin  80 mg Per Tube QPC supper  . chlorhexidine  15 mL Mouth Rinse BID  . free water  200 mL Per Tube 3 times per day  . insulin aspart  0-15 Units Subcutaneous 6 times per day  . latanoprost  1 drop Right Eye QHS  . metoprolol tartrate  25 mg Per Tube BID  . phenytoin (DILANTIN) IV  1,000 mg Intravenous Once  . [START ON 09/12/2014] phenytoin (DILANTIN) IV  100 mg Intravenous 3 times per day    Conditions of Recording:  This is a 16 channel EEG carried out with the patient in the poorly responsive state.  Description:  The background activity consists of a low voltage poorly organized mixture of theta and delta activity that is diffusely distributed.  For the most part this activity is continuous but there are noted occasional short periods of generalized attenuation.  Also noted frequently throughout the tracing are intermittent discharges of triphasic morphology.  On most occasions this activity is generalized but there are periods of independent left and right hemispheric triphasic discharges.   There is no evidence of normal drowse or sleep.   Hyperventilation and intermittent photic stimulation were not performed.   IMPRESSION: This is a markedly abnormal electroencephalogram secondary to general background slowing with frequent intermittent discharges of triphasic morphology.  Although this may be seen with a severe metabolic encephalopathy can not rule out the possibility that this may represent epileptic  phenomenon.  Clinical correlation recommended.     Results discussed with Dr. Pearlean BrownieSethi.  Dilantin started.     Thana FarrLeslie Kynleigh Artz, MD [facility] [phone] 09/11/2014, 5:49 PM

## 2014-09-11 NOTE — Progress Notes (Signed)
PULMONARY / CRITICAL CARE MEDICINE - consult   Name: Susan Calhoun MRN: [id number] DOB: 07/12/1928    ADMISSION DATE:  09/01/2014 CONSULTATION DATE:  10/14   REFERRING MD :  Elisabeth Pigeonevine   CHIEF COMPLAINT:  Acute encephalopathy   INITIAL PRESENTATION:   80 y.o. female with PMH of dm, AF (on rate cntl and coumadin). Admitted 09/01/14 with working dx of UTI and acute encephalopathy. CT head Patient was started on empiric Levaquin for possible urinary tract infection. CT head was negative on admit. Had no improvement so MRI obtained on 10/14 showing:  worsening hydrocephalus and layering material in the occipital horns of the lateral ventricles w/ question of hemorrhage raising concern for meningitis. [facility].   STUDIES:  CT head 10/14: Mild, likely age-appropriate, centralized volume loss without acute intracranial process MR brain 10/14: Acute hydrocephalus, worsened since 3 days ago. Layering material in the occipital horns of lateral ventricles, at least partly hemorrhagic. Question focal thrombus in the atrium of the right lateral ventricle, possibly associated with the choroid. This could  be the site of hemorrhage. No specific vascular lesion is identified. No intraparenchymal hemorrhage. The differential diagnosis does include meningitis.   SIGNIFICANT EVENTS: 10/12: [facility]  10/14: No improvement. MR brain worrisome for worsening hydrocephalus and possibly meningitis. [facility]   10/14- coags corrected and drain IVC placed, high pressure noted 10/15 R frontal IVC placement [name](Stern) 10/21 ventric clamped  SUBJECTIVE:   afebrile No obvious pain  VITAL SIGNS: Temp:  [98.7 F (37.1 C)-100.2 F (37.9 C)] 98.7 F (37.1 C) (10/22 0751) Pulse Rate:  [30-110] 87 (10/22 0700) Resp:  [18-27] 18 (10/22 0700) BP: (110-157)/(29-104) 134/46 mmHg (10/22 0700) SpO2:  [96 %-100 %] 98 % (10/22 0700) HEMODYNAMICS:    VENTILATOR SETTINGS:   INTAKE / OUTPUT:  Intake/Output Summary (Last 24 hours) at 09/11/14 0947 Last data filed at 09/11/14 0700  Gross per 24 hour  Intake   1790 ml  Output    760 ml  Net   1030 ml   PHYSICAL EXAMINATION: General: RASS -1, + F/C Neuro: MAEs, monosyllables, not oriented, int folows commands HEENT: IVC drain in place Cardiovascular: IR, no M Lungs:  Clear Abdomen:  Soft, BS wnl, no r/g Ext:  No edema  LABS: I have reviewed all of today's lab results. Relevant abnormalities are discussed in the A/P section  CBC  Recent Labs Lab 09/09/14 0500 09/10/14 0500 09/11/14 0500  WBC 11.3* 10.2 10.2  HGB 9.6* 9.4* 9.3*  HCT 29.9* 28.7* 28.4*  PLT 163 188 197   Coag's  Recent Labs Lab 09/05/14 0545 09/06/14 0500  INR 1.11 1.11   BMET  Recent Labs Lab 09/09/14 0500 09/10/14 0500 09/11/14 0500  NA 143 144 141  K 3.7 3.8 4.0  CL 108 109 107  CO2 24 24 24   BUN 38* 35* 34*  CREATININE 1.26* 1.21* 1.21*  GLUCOSE 151* 150* 121*   Electrolytes  Recent Labs Lab 09/05/14 0545 09/06/14 0500  09/09/14 0500 09/10/14 0500 09/11/14 0500  CALCIUM 8.4 7.5*  < > 7.9* 7.8* 7.7*  MG 2.0 2.0  --   --   --   --   PHOS 2.6 2.4  --   --   --   --   < > = values in this interval not displayed. Sepsis Markers No results found for this basename: LATICACIDVEN, PROCALCITON, O2SATVEN,  in the last 168 hours ABG No results  found for this basename: PHART, PCO2ART, PO2ART,  in the last 168 hours Liver Enzymes No results found for this basename: AST, ALT, ALKPHOS, BILITOT, ALBUMIN,  in the last 168 hours Cardiac Enzymes No results found for this basename: TROPONINI, PROBNP,  in the last 168 hours Glucose  Recent Labs Lab 09/10/14 1129 09/10/14 1557 09/10/14 1959 09/10/14 2305 09/11/14 0314 09/11/14 0749  GLUCAP 157* 134* 122* 145* 121* 108*    Imaging No results found. ASSESSMENT / PLAN:  NEUROLOGIC A:   Hydrocephalus s/p ventricular drain  -unclear cause P:   Neuro following - clamped ventric with plan to dc eventual   PULMONARY  A: Air way protection ok for now P:   Supp O2 Monitor in ICU  CARDIOVASCULAR A:  AF, CVR -INR 1.5 on admit P:  [facility]  RENAL A:  Hypernatremia -improved Hypokalemia P:   Monitor BMET intermittently Monitor I/Os Correct electrolytes as indicated   GASTROINTESTINAL A:   Dysphagia P:   SUP: N/I Cont TFs Doubt she will pass swallow   HEMATOLOGIC A:   Coumadin induced coagulapthy- resolved P:  DVT px: SQ heparin Monitor CBC intermittently Transfuse per usual ICU guidelines  INFECTIOUS A:   UTI  No evidence meningitis Unlikely encephalitis  Minimal suspicion for bacterial component P:   BCx2 10/12>> NEG UC 10/12: neg  CSF with WBC 168, neutros 85, lymphs 7 ' CSF cx >> NEG  Abx: CTX, start date 10/14, d/c ABX vanc start date 10/14 d/c abx ampicillin date 10/14 d/c Acyclovir start date 10/14>>>10/15  ENDOCRINE A:   DM  2 P:   Cont SSI  Family Updated - 10/21 grand daughter  TODAY'S SUMMARY: Remains with fluctuant mental status s/p v stomy    The patient is critically ill with multiple organ systems failure and requires high complexity decision making for assessment and support, frequent evaluation and titration of therapies, application of advanced monitoring technologies and extensive interpretation of multiple databases.    Cyril Mourningakesh Anival Pasha MD. Tonny BollmanFCCP. [facility] Pager [phone] If no response call [phone]

## 2014-09-11 NOTE — Progress Notes (Signed)
Critical Value on WBC on CSF fluid came back 14. Dr Yetta BarreJones made aware. No new orders at this time.

## 2014-09-11 NOTE — Progress Notes (Signed)
Bedside EEG completed, results pending. 

## 2014-09-12 ENCOUNTER — Inpatient Hospital Stay (HOSPITAL_COMMUNITY): Payer: Medicare (Managed Care)

## 2014-09-12 LAB — CBC
HEMATOCRIT: 29.7 % — AB (ref 36.0–46.0)
HEMOGLOBIN: 9.5 g/dL — AB (ref 12.0–15.0)
MCH: 29.9 pg (ref 26.0–34.0)
MCHC: 32 g/dL (ref 30.0–36.0)
MCV: 93.4 fL (ref 78.0–100.0)
Platelets: 233 10*3/uL (ref 150–400)
RBC: 3.18 MIL/uL — ABNORMAL LOW (ref 3.87–5.11)
RDW: 17.2 % — AB (ref 11.5–15.5)
WBC: 13.2 10*3/uL — ABNORMAL HIGH (ref 4.0–10.5)

## 2014-09-12 LAB — PHENYTOIN LEVEL, TOTAL: PHENYTOIN LVL: 9.3 ug/mL — AB (ref 10.0–20.0)

## 2014-09-12 LAB — GLUCOSE, CAPILLARY
GLUCOSE-CAPILLARY: 164 mg/dL — AB (ref 70–99)
Glucose-Capillary: 151 mg/dL — ABNORMAL HIGH (ref 70–99)
Glucose-Capillary: 153 mg/dL — ABNORMAL HIGH (ref 70–99)
Glucose-Capillary: 159 mg/dL — ABNORMAL HIGH (ref 70–99)
Glucose-Capillary: 174 mg/dL — ABNORMAL HIGH (ref 70–99)
Glucose-Capillary: 184 mg/dL — ABNORMAL HIGH (ref 70–99)
Glucose-Capillary: 211 mg/dL — ABNORMAL HIGH (ref 70–99)

## 2014-09-12 LAB — PATHOLOGIST SMEAR REVIEW: Path Review: INCREASED

## 2014-09-12 MED ORDER — PHENYTOIN SODIUM 50 MG/ML IJ SOLN: 100.0000 mg | Freq: Three times a day (TID) | INTRAMUSCULAR | Status: DC

## 2014-09-12 MED ORDER — LORAZEPAM 2 MG/ML IJ SOLN
1.0000 mg | Freq: Once | INTRAMUSCULAR | Status: AC
  Administered 2014-09-12: 1 mg via INTRAVENOUS

## 2014-09-12 MED ORDER — PIPERACILLIN-TAZOBACTAM 3.375 G IVPB 30 MIN
3.3750 g | Freq: Once | INTRAVENOUS | Status: AC
  Administered 2014-09-12: 3.375 g via INTRAVENOUS
  Filled 2014-09-12: qty 50

## 2014-09-12 MED ORDER — LORAZEPAM 2 MG/ML IJ SOLN
INTRAMUSCULAR | Status: AC
  Filled 2014-09-12: qty 1

## 2014-09-12 MED ORDER — SODIUM CHLORIDE 0.9 % IV SOLN
200.0000 mg | Freq: Once | INTRAVENOUS | Status: AC
  Administered 2014-09-12: 200 mg via INTRAVENOUS
  Filled 2014-09-12: qty 20

## 2014-09-12 MED ORDER — PIPERACILLIN-TAZOBACTAM 3.375 G IVPB
3.3750 g | Freq: Three times a day (TID) | INTRAVENOUS | Status: DC
  Administered 2014-09-13 – 2014-09-19 (×19): 3.375 g via INTRAVENOUS
  Filled 2014-09-12 (×20): qty 50

## 2014-09-12 MED ORDER — SODIUM CHLORIDE 0.9 % IV SOLN
100.0000 mg | Freq: Two times a day (BID) | INTRAVENOUS | Status: DC
  Administered 2014-09-13 (×2): 100 mg via INTRAVENOUS
  Filled 2014-09-12 (×6): qty 10

## 2014-09-12 MED ORDER — VANCOMYCIN HCL IN DEXTROSE 750-5 MG/150ML-% IV SOLN
750.0000 mg | INTRAVENOUS | Status: DC
  Administered 2014-09-12 – 2014-09-14 (×3): 750 mg via INTRAVENOUS
  Filled 2014-09-12 (×4): qty 150

## 2014-09-12 MED ORDER — GADOBENATE DIMEGLUMINE 529 MG/ML IV SOLN
15.0000 mL | Freq: Once | INTRAVENOUS | Status: AC
  Administered 2014-09-12: 13 mL via INTRAVENOUS

## 2014-09-12 NOTE — Progress Notes (Signed)
STROKE TEAM PROGRESS NOTE   HISTORY Susan Calhoun is an 80 y.o. female who was noted by family to be acting confused > 2 weeks prior. Patient was brought to the hospital where she was diagnosed with a UTI. She was given Rocephin and sent home on Keflex. Due to not being awake enough to take her medications she was brought back to the hospital. MRI brain showed "layering material in the occipital horns of lateral ventricles, at least partly hemorrhagic. Question focal thrombus in the atrium of the right lateral ventricle, possibly associated with the choroid. This could be the site of hemorrhage". Infectious disease was consulted and they do not feel this is bacterial meningoencephalitis and ABX were D/C'd. Neuro surgery placed a Ventriculostomy drain for HCP. Since drain placement her mentation has not fully resolved. Repeat head CT shows no worsening of HCP or blood products. Neurology was asked to further evaluate. At time of evaluation, patient intubated on the vent, Temp 100.8, WBC 12.2. CSF: WBC 168, red blood cells 70000, lymph 7, neutrophile 85, glucose 78, protein 139   SUBJECTIVE (INTERVAL HISTORY) Grand daughter is at the bedside. Patient remains somnolent and can be barely arouse to follow few questions. Patient's neurological exam has remained unchanged despite ventriculostomy  insertion and subsequent removal. Parameters for infection, metabolic have been unremarkable. She did not wake up with trial dose of Ritalin. EEG was normal few days ago but repeat EEG showed triphasics worrisome for possible seizures and was loaded with phenytoin but yet has not improved. OBJECTIVE Temp:  [98.2 F (36.8 C)-99.4 F (37.4 C)] 99.2 F (37.3 C) (10/23 1129) Pulse Rate:  [75-120] 100 (10/23 1300) Cardiac Rhythm:  [-] Atrial fibrillation (10/23 1300) Resp:  [18-33] 23 (10/23 1300) BP: (99-150)/(32-95) 138/90 mmHg (10/23 1300) SpO2:  [94 %-100 %] 99 % (10/23 1300)   Recent Labs Lab  09/11/14 2008 09/11/14 2340 09/12/14 0348 09/12/14 0827 09/12/14 1128  GLUCAP 173* 184* 151* 164* 174*    Recent Labs Lab 09/06/14 0500 09/07/14 0255 09/09/14 0500 09/10/14 0500 09/11/14 0500  NA 150* 149* 143 144 141  K 3.1* 4.2 3.7 3.8 4.0  CL 117* 118* 108 109 107  CO2 23 19 24 24 24   GLUCOSE 164* 156* 151* 150* 121*  BUN 40* 42* 38* 35* 34*  CREATININE 1.28* 1.33* 1.26* 1.21* 1.21*  CALCIUM 7.5* 7.7* 7.9* 7.8* 7.7*  MG 2.0  --   --   --   --   PHOS 2.4  --   --   --   --    No results found for this basename: AST, ALT, ALKPHOS, BILITOT, PROT, ALBUMIN,  in the last 168 hours  Recent Labs Lab 09/06/14 0500 09/07/14 0255 09/09/14 0500 09/10/14 0500 09/11/14 0500  WBC 10.8* 12.2* 11.3* 10.2 10.2  HGB 9.9* 10.8* 9.6* 9.4* 9.3*  HCT 30.6* 33.6* 29.9* 28.7* 28.4*  MCV 91.3 94.1 92.0 93.8 91.3  PLT 149* 143* 163 188 197   No results found for this basename: CKTOTAL, CKMB, CKMBINDEX, TROPONINI,  in the last 168 hours No results found for this basename: LABPROT, INR,  in the last 72 hours No results found for this basename: COLORURINE, APPERANCEUR, LABSPEC, PHURINE, GLUCOSEU, HGBUR, BILIRUBINUR, KETONESUR, PROTEINUR, UROBILINOGEN, NITRITE, LEUKOCYTESUR,  in the last 72 hours     Component Value Date/Time   CHOL 204* 09/02/2014 0530   TRIG 149 09/02/2014 0530   HDL 64 09/02/2014 0530   CHOLHDL 3.2 09/02/2014 0530   VLDL 30 09/02/2014  0530   LDLCALC 110* 09/02/2014 0530   Lab Results  Component Value Date   HGBA1C 5.6 09/01/2014   No results found for this basename: labopia,  cocainscrnur,  labbenz,  amphetmu,  thcu,  labbarb    No results found for this basename: ETH,  in the last 168 hours    Ct Head Wo Contrast 09/08/2014  1. Unchanged dilatation of the third ventricle and likely no significant interval change in dilatation of the lateral ventricles. 2. Unchanged intraventricular blood. 3. Small volume of subarachnoid blood.    09/05/2014   1. Interval  placement of right frontal approach ventriculostomy catheter with tip in the third ventricle. Ventricles remain dilated but are slightly decreased in size relative to previous MRI from 09/03/2014. 2. Layering hemorrhage within the occipital horns of the lateral ventricles bilaterally, stable from prior. 3. Question trace acute subarachnoid hemorrhage within the right sylvian fissure.     08/31/2014  Mild, likely age-appropriate, centralized volume loss without acute intracranial process.     [name] Contrast 09/03/2014    Acute hydrocephalus, worsened since 3 days ago. Layering material in the occipital horns of lateral ventricles, at least partly hemorrhagic. Question focal thrombus in the atrium of the right lateral ventricle, possibly associated with the choroid. This could be the site of hemorrhage. No specific vascular lesion is identified. No intraparenchymal hemorrhage. The differential diagnosis does include meningitis.    Dg Chest Port 1 View 09/06/2014   Feeding tube with weighted tip in stomach.   09/04/2014    New left jugular central venous catheter tip overlies the distal SVC. No evidence of pneumothorax.  Stable cardiomegaly.  No active lung disease.     Dg Chest 2 View 08/31/2014    Borderline cardiomegaly and lung hyperexpansion without acute cardiopulmonary disease.   EEG 09/11/14 : This is a markedly abnormal electroencephalogram secondary to general background slowing with frequent intermittent discharges of triphasic morphology. Although this may be seen with a severe metabolic encephalopathy can not rule out the possibility that this may represent epileptic phenomenon. Clinical correlation recommended.      PHYSICAL EXAM Frail elderly Caucasian lady not in distress. . Afebrile. Head is nontraumatic. Neck is supple without bruit.  Cardiac exam no murmur or gallop. Lungs are clear to auscultation. Distal pulses are well felt. Neurological Exam : Drowsy. Barely opens eyes  to stimuli.not following commands. mumbles few words only Pupils irregular but reactive. Slight dysconjugate gaze with left eye hypotropia. Roving eye movements present. Does not blink to threat on either side. Fundi could not be visualized. Face is symmetric. Tongue is midline. Moves all 4 extremities purposefully against gravity but mild left sided weakness more in the arm than the leg. Left plantar upgoing right equivocal. Sensation and coordination cannot be reliably tested. Gait was not tested.  ASSESSMENT/PLAN Susan Calhoun is a 80 y.o. female with history of altered mental status following UTI, seen in the ED 08/31/2014 - admitted 09/01/2014 with confusion. Patient was on coumadin at that time for atrial fibrillation. INR no checked. Administered Levaquin. BP elevated on the 11th at 166/44, max BP next day 189/112. In retrospect, initial CT with L IVH. Repeat CT done during workup found to have worsening of IVH with bilateral occipital intraventricular blood with hydrocephalus. Hypertension and coumadin associated coagulapathy etiology of hemorrhage.   Stroke:  Non-dominant IVH secondary to coumadin associated coagulopathy in setting of uncontrolled hypertension and levaquin use.   Intraventricular drain placed with no improvement  of mental status.  CSF without source of infection.   SCDs for VTE prophylaxis   NPO  Up with assistance  warfarin prior to admission  Ongoing aggressive risk factor management  Resultant confusion, unable to follow commands mental status disproportionate to amount of IVH and CSF pressure.    Therapy recommendations:  SNF  Disposition:  SNF Abnormal EEG showing frequent triphasic discharges ? Epileptiform - Continue dilantin 100 mg q 8 hrly. Will get Neurohospitalist second opinion per family wishes. Atrial Fibrillation  Home meds:  Warfarin prior to admission  INR 1.51 on admission 10/12. Not checked 10/11 when seen in ED  No longer an  anticoagulation candidate secondary to hemorrhage   Hypertension  Stable  Hyperlipidemia  Home meds:  lipitor 80 resumed in hospital  LDL 110 goal < 70  Continue statin at discharge  Diabetes  HgbA1c 5.6 goal < 7.0  Controlled  Other Stroke Risk Factors Advanced age   Obesity, Body mass index is 26.97 kg/(m^2).    Coronary artery disease - s/p CABG ICA stenosis s/p CEA 2015 PVD  Other Active Problems  UTI with pyelonephritis  CKD   Hospital day # 11  [facility] BIBY, MSN, RN, ANVP-BC, ANP-BC, Lawernce IonGNP-BC [facility] Pager: [phone] 09/12/2014 2:24 PM  This patient is critically ill and at significant risk of neurological worsening, death and care requires constant monitoring of vital signs, hemodynamics,respiratory and cardiac monitoring,review of multiple databases, neurological assessment, discussion with family, other specialists and medical decision making of high complexity.I have made any additions or clarifications directly to the above note.  I spent 38 minutes of neurocritical care time  in the care of  this patient. I have personally examined this patient, reviewed notes, independently viewed imaging studies, participated in medical decision making and plan of care. I have made any additions or clarifications directly to the above note. Agree with note above.   Delia HeadyPramod Berlie Persky, MD Medical Director [facility] Pager: [phone] 09/12/2014 2:24 PM    To contact Stroke Continuity provider, please refer to [email or web address]. After hours, contact General Neurology

## 2014-09-12 NOTE — Progress Notes (Signed)
Patient having rhythmic seizure-like activity in bilateral lower extremities. Temperature of 102.5 orally despite Tylenol and Ice Packs. E-Link notified as well as Dr. Roseanne RenoStewart. New orders given and carried out. Ripley FraiseHarper, Jashiya Bassett D

## 2014-09-12 NOTE — Progress Notes (Signed)
Notified Dr. Pearlean BrownieSethi of current assessment change.  Was reported that pt was opening eyes to pain.  Pt no longer opening eyes to pain.  Only w/d bue to pain; flex ble to pain.  MAE.  Seems to move right more than left.  Dr. Pearlean BrownieSethi ordered MRI with contrast and continued anti seizure medications.

## 2014-09-12 NOTE — Progress Notes (Signed)
OT Cancellation Note  Patient Details Name: Susan Calhoun MRN: [id number] DOB: 10/22/1928   Cancelled Treatment:    Reason Eval/Treat Not Completed: Medical issues which prohibited therapy - Pt with decreased responsiveness.  Will check back next week.   [name], [name], [name]  09/12/2014, 5:34 PM

## 2014-09-12 NOTE — Progress Notes (Signed)
NEURO HOSPITALIST PROGRESS NOTE   SUBJECTIVE:                                                                                                                        Asked by Dr. Pearlean Brownie to evaluate patient regarding persistent altered mental status and concerns for seizures. Patient is known to me as I was involved in her initial neurological consultation." Admitted 09/01/14 with working dx of UTI and acute encephalopathy. Due to not being awake enough to take her medications she was brought back to the hospital. MRI brain showed "layering material in the occipital horns of lateral ventricles, at least partly hemorrhagic. Question focal thrombus in the atrium of the right lateral ventricle, possibly associated with the choroid. This could be the site of hemorrhage. Infectious disease was consulted and they do not feel this is bacterial meningoencephalitis and ABX were D/C'd. Neuro surgery placed a Ventriculostomy drain for HCP. Since drain placement her mentation has not fully resolved"  Events of the past few days noted and clinical notes reviewed. Mrs. Borgmeyer remains encephalopathic despite no evidence of infection, electrolytes disturbances, or use of sedatives/psycotropics. Cr. 1.21(stable since admission). Ammonia 21. Nursing staff noted a change in mental status today stating that she is not opening her eyes to pain. CSF 10/22: wbc 14, RBC 660, protein 21, glucose 66. EEG 10/22: " general background slowing with frequent intermittent discharges of triphasic morphology. Although this may be seen with a severe metabolic encephalopathy can not rule out the possibility that this may represent epileptic phenomenon". Given these findings, patient was loaded with dilantin and is currently maintained in 100 mg IV q 8 hours, with dilantin level 9.3 this morning. The ventriculostomy catheter was removed 2 days ago. CT brain 10/22: unchanged hydrocephalus ,  intraventricular hemorrhage slightly improved. Small area of SAH or calcification in the right parietal lobe. Presently, patient is not responding to verbal commands.  OBJECTIVE:                                                                                                                           Vital signs in last 24 hours: Temp:  [98.2 F (36.8 C)-99.4 F (37.4 C)] 98.2 F (36.8 C) (10/23 0349) Pulse Rate:  [  75-99] 82 (10/23 0700) Resp:  [17-27] 20 (10/23 0700) BP: (99-150)/(32-77) 116/53 mmHg (10/23 0700) SpO2:  [94 %-100 %] 99 % (10/23 0700)  Intake/Output from previous day: 10/22 0701 - 10/23 0700 In: 1360 [NG/GT:1330] Out: 970 [Urine:970] Intake/Output this shift: Total I/O In: 130 [Other:40; NG/GT:90] Out: 125 [Urine:125] Nutritional status:    Past Medical History  Diagnosis Date  . Diabetes mellitus with diabetic polyneuropathy   . A-fib   . Renal disorder   . Hypertension   . Anemia   . Glaucoma     right eye   . Carotid artery stenosis   . PVD (peripheral vascular disease)    Physical exam: no apparent distress. Head: normocephalic. Neck: supple, no bruits, no JVD. Cardiac: no murmurs. Lungs: clear. Abdomen: soft, no tender, no mass. Extremities: no edema. Neurologic Exam:  General: NAD  Mental Status:  Unresponsive to verbal commands. Cranial Nerves:  II: Discs flat bilaterally; blink to threat intact, left pupil asymmetric (S/P surgery) right pupil shows 2mm and symmetric  III,IV, VI: ptosis not present, extra-ocular motions intact bilaterally and closes eyes tightly to light shown in eyes.  V,VII: smile symmetric, winces to pain  VIII: hearing no tested  IX,X: gag reflex present  XI: bilateral shoulder shrug  XII: midline tongue extension  Motor:  moving all extremities antigravity with good strength and symmetrically  Sensory: Pinprick and light touch intact throughout, bilaterally  Deep Tendon Reflexes:  1+ throughout  Plantars:   Right: downgoing Left: downgoing  Cerebellar:  Unable to assess  Gait: unable to assess   Lab Results: Lab Results  Component Value Date/Time   CHOL 204* 09/02/2014  5:30 AM   Lipid Panel No results found for this basename: CHOL, TRIG, HDL, CHOLHDL, VLDL, LDLCALC,  in the last 72 hours  Studies/Results: Ct Head Wo Contrast  09/11/2014   CLINICAL DATA:  Hydrocephalus.  Clamped intraventricular catheter.  EXAM: CT HEAD WITHOUT CONTRAST  TECHNIQUE: Contiguous axial images were obtained from the base of the skull through the vertex without intravenous contrast.  COMPARISON:  CT 09/08/2014  FINDINGS: Right frontal ventricular catheter extends into the third ventricle unchanged in position. Hydrocephalus of the third fourth and lateral ventricles is unchanged. Layering high density blood in the occipital horns bilaterally slightly improved. No new hemorrhage in the ventricles  Small 5 mm hyperdensity in the right parietal lobe may represent some subarachnoid hemorrhage or calcification. This is unchanged over serial exams.  IMPRESSION: Normal ventricular enlargement is stable. Intraventricular hemorrhage slightly improved. Small area of hemorrhage or calcification in the right parietal lobe is stable.   Electronically Signed   By: Marlan Palauharles  Clark M.D.   On: 09/11/2014 08:02    MEDICATIONS                                                                                                                        Scheduled: . amiodarone  200 mg Per Tube Daily  . antiseptic oral rinse  7  mL Mouth Rinse q12n4p  . atorvastatin  80 mg Per Tube QPC supper  . chlorhexidine  15 mL Mouth Rinse BID  . free water  200 mL Per Tube 3 times per day  . insulin aspart  0-15 Units Subcutaneous 6 times per day  . latanoprost  1 drop Right Eye QHS  . metoprolol tartrate  25 mg Per Tube BID  . phenytoin (DILANTIN) IV  100 mg Intravenous 3 times per day    ASSESSMENT/PLAN:                                                                                                             80 YO female initially was on coumadin with sub therapeutic INR presenting to hospital with UTI and AMS. Later found to have bilateral occipital intraventricular blood product with hydrocephalus. Intraventricular drain placed with no improvement of mental status.  CSF obtained is not suggestive of infection. Etiology of AMS unclear but EEG yesterday with concerns for subclinical seizures. Recommend: 1) Continuous EEG monitoring. 2) MRI brain (already ordered by Dr. Pearlean BrownieSethi). 3) Continue dilantin. 4) Will follow up. Discussed with daughter at the bedside.   Wyatt Portelasvaldo Camilo, MD [facility] [phone]  09/12/2014, 10:11 AM

## 2014-09-12 NOTE — Progress Notes (Signed)
NOTIFIED DR. Roseanne RenoSTEWART OF PT'S NEURO ASSESSMENT SLOWLY DECLINING AND PT'S CONCERNS THAT THIS BEGAN AFTER DRAIN WAS DC'D.  PT HAS BEEN WD FROM PAIN BUE AND FLEXING TO PAIN BLE WITH SPONTANEOUS MOVEMENT THIS SHIFT.  NOW PT IS ONLY FLEXING BUE WITH MINIMAL SPONTANEOUS MOVEMENT AND MINIMAL FLEXION TO PAIN BLE.  RIGHT PUPIL REACTIVE LEFT PUPIL  AT BASELINE - IRREGULAR AND NONREACTIVE.  REPORT GIVEN TO MONICA WHO WILL CONTINUE TO MONITOR FOR FURTHER CHANGE.  NO NEW ORDERS GIVEN AT THIS TIME.

## 2014-09-12 NOTE — Progress Notes (Signed)
UR completed.  Pt likely to need SNF at d/c. CSW aware.   Carlyle LipaMichelle Lenea Bywater, RN BSN MHA CCM Trauma/Neuro ICU Case Manager [phone]

## 2014-09-12 NOTE — Progress Notes (Signed)
ANTIBIOTIC CONSULT NOTE - INITIAL  Pharmacy Consult for Vancomycin Indication: Fever  Allergies  Allergen Reactions  . Septra [Sulfamethoxazole-Trimethoprim] Hives    Patient Measurements: Height: 4\' 11"  (149.9 cm) Weight: 133 lb 9.6 oz (60.6 kg) IBW/kg (Calculated) : 43.2 Vital Signs: Temp: 102.5 F (39.2 C) (10/23 2000) Temp Source: Oral (10/23 2000) BP: 121/56 mmHg (10/23 2200) Pulse Rate: 115 (10/23 2200) Intake/Output from previous day: 10/22 0701 - 10/23 0700 In: 1360 [NG/GT:1330] Out: 970 [Urine:970] Intake/Output from this shift: Total I/O In: 490 [NG/GT:490] Out: 300 [Urine:300]  Labs:  Recent Labs  09/10/14 0500 09/11/14 0500  WBC 10.2 10.2  HGB 9.4* 9.3*  PLT 188 197  CREATININE 1.21* 1.21*   Estimated Creatinine Clearance: 27.4 ml/min (by C-G formula based on Cr of 1.21). No results found for this basename: VANCOTROUGH, Leodis BinetVANCOPEAK, VANCORANDOM, GENTTROUGH, GENTPEAK, GENTRANDOM, TOBRATROUGH, TOBRAPEAK, TOBRARND, AMIKACINPEAK, AMIKACINTROU, AMIKACIN,  in the last 72 hours   Microbiology: Recent Results (from the past 720 hour(s))  URINE CULTURE     Status: None   Collection Time    09/01/14  8:52 PM      Result Value Ref Range Status   Specimen Description URINE, RANDOM   Final   Special Requests NONE   Final   Culture  Setup Time     Final   Value: 09/02/2014 05:33     Performed at [facility]   Colony Count     Final   Value: NO GROWTH     [facility]formed [facility]   Culture     Final   Value: NO GROWTH     Performed at [facility]   Report Status 09/03/2014 FINAL   Final  CULTURE, BLOOD (ROUTINE X 2)     Status: None   Collection Time    09/01/14  9:18 PM      Result Value Ref Range Status   Specimen Description BLOOD RIGHT ARM   Final   Special Requests BOTTLES DRAWN AEROBIC AND ANAEROBIC 4CC   Final   Culture  Setup Time     Final   Value: 09/02/2014 04:13     Performed at [facility]   Culture      Final   Value: NO GROWTH 5 DAYS     Performed at [facility]   Report Status 09/08/2014 FINAL   Final  CULTURE, BLOOD (ROUTINE X 2)     Status: None   Collection Time    09/01/14  9:45 PM      Result Value Ref Range Status   Specimen Description BLOOD RIGHT HAND   Final   Special Requests BOTTLES DRAWN AEROBIC ONLY 8CC   Final   Culture  Setup Time     Final   Value: 09/02/2014 04:12     Performed at [facility]   Culture     Final   Value: NO GROWTH 5 DAYS     Performed at [facility]   Report Status 09/08/2014 FINAL   Final  MRSA PCR SCREENING     Status: None   Collection Time    09/03/14  4:41 PM      Result Value Ref Range Status   MRSA by PCR NEGATIVE  NEGATIVE Final   Comment:            The GeneXpert MRSA Assay (FDA     approved for NASAL specimens     only), is one component of a  comprehensive MRSA colonization     surveillance program. It is not     intended to diagnose MRSA     infection nor to guide or     monitor treatment for     MRSA infections.  CSF CULTURE     Status: None   Collection Time    09/03/14  5:40 PM      Result Value Ref Range Status   Specimen Description CSF   Final   Special Requests NONE   Final   Gram Stain     Final   Value: RARE WBC PRESENT,BOTH PMN AND MONONUCLEAR     NO ORGANISMS SEEN     Performed at [facility]     Performed at [facility]   Culture     Final   Value: NO GROWTH 3 DAYS     Performed at [facility]   Report Status 09/07/2014 FINAL   Final  GRAM STAIN     Status: None   Collection Time    09/03/14  5:40 PM      Result Value Ref Range Status   Specimen Description CSF   Final   Special Requests NONE   Final   Gram Stain     Final   Value: RARE WBC PRESENT,BOTH PMN AND MONONUCLEAR     NO ORGANISMS SEEN   Report Status 09/04/2014 FINAL   Final  CLOSTRIDIUM DIFFICILE BY PCR     Status: None   Collection Time    09/06/14 12:00 PM      Result Value Ref  Range Status   C difficile by pcr NEGATIVE  NEGATIVE Final  CSF CULTURE     Status: None   Collection Time    09/11/14 10:08 AM      Result Value Ref Range Status   Specimen Description CSF   Final   Special Requests NO 3 CUP   Final   Gram Stain     Final   Value: WBC PRESENT,BOTH PMN AND MONONUCLEAR     NO ORGANISMS SEEN     CYTOSPIN Performed at [facility]     Performed at [facility]   Culture     Final   Value: NO GROWTH 1 DAY     Performed at [facility]   Report Status PENDING   Incomplete  GRAM STAIN     Status: None   Collection Time    09/11/14 10:08 AM      Result Value Ref Range Status   Specimen Description CSF   Final   Special Requests NO 3 CUP   Final   Gram Stain     Final   Value: cytospin slide     WBC PRESENT,BOTH PMN AND MONONUCLEAR     NO ORGANISMS SEEN   Report Status 09/11/2014 FINAL   Final    Medical History: Past Medical History  Diagnosis Date  . Diabetes mellitus with diabetic polyneuropathy   . A-fib   . Renal disorder   . Hypertension   . Anemia   . Glaucoma     right eye   . Carotid artery stenosis   . PVD (peripheral vascular disease)     Medications:  Anti-infectives   Start     Dose/Rate Route Frequency Ordered Stop   09/13/14 0600  piperacillin-tazobactam (ZOSYN) IVPB 3.375 g     3.375 g 12.5 mL/hr over 240 Minutes Intravenous 3 times per day 09/12/14 2248  09/12/14 2300  piperacillin-tazobactam (ZOSYN) IVPB 3.375 g     3.375 g 100 mL/hr over 30 Minutes Intravenous  Once 09/12/14 2254     09/04/14 2200  acyclovir (ZOVIRAX) 500 mg in dextrose 5 % 100 mL IVPB  Status:  Discontinued     500 mg 110 mL/hr over 60 Minutes Intravenous Every 12 hours 09/04/14 1345 09/04/14 1543   09/04/14 1600  vancomycin (VANCOCIN) IVPB 750 mg/150 ml premix  Status:  Discontinued     750 mg 150 mL/hr over 60 Minutes Intravenous Every 24 hours 09/03/14 1518 09/03/14 1537   09/04/14 0200  cefTRIAXone (ROCEPHIN) 2 g in  dextrose 5 % 50 mL IVPB  Status:  Discontinued     2 g 100 mL/hr over 30 Minutes Intravenous Every 12 hours 09/03/14 1431 09/03/14 1537   09/03/14 2200  acyclovir (ZOVIRAX) 430 mg in dextrose 5 % 100 mL IVPB  Status:  Discontinued     430 mg 108.6 mL/hr over 60 Minutes Intravenous Every 12 hours 09/03/14 1011 09/04/14 1345   09/03/14 1800  ampicillin (OMNIPEN) 1 g in sodium chloride 0.9 % 50 mL IVPB  Status:  Discontinued     1 g 150 mL/hr over 20 Minutes Intravenous 4 times per day 09/03/14 1519 09/03/14 1537   09/03/14 1600  vancomycin (VANCOCIN) 1,500 mg in sodium chloride 0.9 % 500 mL IVPB  Status:  Discontinued     1,500 mg 250 mL/hr over 120 Minutes Intravenous  Once 09/03/14 1517 09/03/14 1537   09/03/14 1515  cefTRIAXone (ROCEPHIN) 2 g in dextrose 5 % 50 mL IVPB  Status:  Discontinued     2 g 100 mL/hr over 30 Minutes Intravenous  Once 09/03/14 1430 09/03/14 1537   09/03/14 1100  acyclovir (ZOVIRAX) 430 mg in dextrose 5 % 100 mL IVPB     430 mg 108.6 mL/hr over 60 Minutes Intravenous  Once 09/03/14 1010 09/03/14 1342   09/01/14 2200  levofloxacin (LEVAQUIN) IVPB 750 mg  Status:  Discontinued     750 mg 100 mL/hr over 90 Minutes Intravenous Every 48 hours 09/01/14 2039 09/03/14 1422     Assessment: 80 year old female with new fever of 102.5, WBC (from 10/22) wnl. SCR 1.21 on 10/22. Est CrCl ~ 25-30 mL/min. Good UOP. Pharmacy consulted to dose vancomycin and Zosyn ordered per MD.   Goal of Therapy:  Vancomycin trough level 15-20 mcg/ml  Plan:  1. Vancomycin 750mg  IV q24h.  2. Monitor renal function, culture results, and clinical status.   Link SnufferJessica Anastacia Reinecke, PharmD, BCPS Clinical Pharmacist [phone] 09/12/2014,11:07 PM

## 2014-09-13 ENCOUNTER — Inpatient Hospital Stay (HOSPITAL_COMMUNITY): Payer: Medicare (Managed Care)

## 2014-09-13 LAB — BLOOD GAS, ARTERIAL
Acid-base deficit: 1.8 mmol/L (ref 0.0–2.0)
Bicarbonate: 21.4 mEq/L (ref 20.0–24.0)
Drawn by: 36496
FIO2: 0.21 %
O2 Saturation: 93.3 %
PCO2 ART: 32.8 mmHg — AB (ref 35.0–45.0)
PH ART: 7.439 (ref 7.350–7.450)
Patient temperature: 102.3
TCO2: 22.3 mmol/L (ref 0–100)
pO2, Arterial: 75.8 mmHg — ABNORMAL LOW (ref 80.0–100.0)

## 2014-09-13 LAB — GLUCOSE, CAPILLARY
GLUCOSE-CAPILLARY: 185 mg/dL — AB (ref 70–99)
GLUCOSE-CAPILLARY: 233 mg/dL — AB (ref 70–99)
GLUCOSE-CAPILLARY: 177 mg/dL — AB (ref 70–99)
Glucose-Capillary: 157 mg/dL — ABNORMAL HIGH (ref 70–99)
Glucose-Capillary: 168 mg/dL — ABNORMAL HIGH (ref 70–99)

## 2014-09-13 LAB — BASIC METABOLIC PANEL
Anion gap: 11 (ref 5–15)
BUN: 34 mg/dL — AB (ref 6–23)
CALCIUM: 7.9 mg/dL — AB (ref 8.4–10.5)
CHLORIDE: 103 meq/L (ref 96–112)
CO2: 23 meq/L (ref 19–32)
CREATININE: 1.17 mg/dL — AB (ref 0.50–1.10)
GFR calc Af Amer: 48 mL/min — ABNORMAL LOW (ref >90)
GFR calc non Af Amer: 42 mL/min — ABNORMAL LOW (ref >90)
Glucose, Bld: 175 mg/dL — ABNORMAL HIGH (ref 70–99)
Potassium: 4.4 mEq/L (ref 3.7–5.3)
Sodium: 137 mEq/L (ref 137–147)

## 2014-09-13 NOTE — Progress Notes (Signed)
Seizure-like activity returned in bilateral lower extremities. Patient less responsive with snoring respirations. Right pupil sluggish. Dr. Roseanne RenoStewart notified. Ripley FraiseHarper, Rudell Marlowe D

## 2014-09-13 NOTE — Progress Notes (Signed)
D/C LTM EEG per Dr Leroy Kennedyamilo

## 2014-09-13 NOTE — Progress Notes (Signed)
NEURO HOSPITALIST PROGRESS NOTE   SUBJECTIVE:                                                                                                                        Remains encephalopathic. c-EEG monitoring revealed marked diffuse generalized slowing consistent with severe encephalopathy but no evidence of electrographic seizures MRI brain 10/23 essentially unchanged. Dilantin d/c today and was given vimpat last night. Low grade fever. CXR with increasing bibasilar atelectasis or infiltrates and suspicion of small effusions.  On zosyn and vanco.   OBJECTIVE:                                                                                                                           Vital signs in last 24 hours: Temp:  [98 F (36.7 C)-102.5 F (39.2 C)] 100.3 F (37.9 C) (10/24 1545) Pulse Rate:  [107-122] 115 (10/24 1700) Resp:  [14-36] 25 (10/24 1700) BP: (91-174)/(30-91) 103/67 mmHg (10/24 1700) SpO2:  [97 %-100 %] 100 % (10/24 1700)  Intake/Output from previous day: 10/23 0701 - 10/24 0700 In: 2040 [NG/GT:1545; IV Piggyback:295] Out: 1325 [Urine:1325] Intake/Output this shift: Total I/O In: 710 [NG/GT:650; IV Piggyback:60] Out: 525 [Urine:525] Nutritional status:    Past Medical History  Diagnosis Date  . Diabetes mellitus with diabetic polyneuropathy   . A-fib   . Renal disorder   . Hypertension   . Anemia   . Glaucoma     right eye   . Carotid artery stenosis   . PVD (peripheral vascular disease)   Physical exam: no apparent distress.  Head: normocephalic.  Neck: supple, no bruits, no JVD.  Cardiac: no murmurs.  Lungs: clear.  Abdomen: soft, no tender, no mass.  Extremities: no edema.  Neurologic Exam:  General: NAD  Mental Status:  Unresponsive to verbal commands.  Cranial Nerves:  II: Discs flat bilaterally; doesn't blink to threat , left pupil asymmetric (S/P surgery) right pupil shows 2mm and symmetric  III,IV, VI:  ptosis not present, extra-ocular motions intact bilaterally and closes eyes tightly to light shown in eyes.  V,VII: smile symmetric, winces to pain  VIII: hearing no tested  IX,X: gag reflex present  XI: bilateral shoulder shrug  XII: midline tongue extension  Motor:  moving all extremities upon noxious stimuli Sensory: unable to assess reliably Deep Tendon Reflexes:  1+ throughout  Plantars:  Right: downgoing Left: downgoing  Cerebellar:  Unable to assess  Gait: unable to assess   Lab Results: Lab Results  Component Value Date/Time   CHOL 204* 09/02/2014  5:30 AM   Lipid Panel No results found for this basename: CHOL, TRIG, HDL, CHOLHDL, VLDL, LDLCALC,  in the last 72 hours  Studies/Results: [name] Contrast  09/12/2014   CLINICAL DATA:  Encephalopathy.  Ventriculostomy drain removal  EXAM: MRI HEAD WITHOUT AND WITH CONTRAST  TECHNIQUE: Multiplanar, multiecho pulse sequences of the brain and surrounding structures were obtained without and with intravenous contrast.  CONTRAST:  13mL MULTIHANCE GADOBENATE DIMEGLUMINE 529 MG/ML IV SOLN  COMPARISON:  CT head 09/11/2014  FINDINGS: Interval removal of right ventricular drain catheter. There is now gas in the right frontal horn which was not prepped yesterday. Diffuse ventricular enlargement is unchanged. There is blood layering in the occipital horns bilaterally. Small amount of blood also present in the left midbrain adjacent to the previous location of the tip of the catheter. There is mild subarachnoid hemorrhage.  Negative for ischemic acute infarct. Negative for mass lesion. No shift of the midline structures.  Postcontrast imaging reveals no enhancing mass lesion. Leptomeningeal enhancement is normal.  IMPRESSION: Diffuse ventricular enlargement unchanged from CT scan yesterday. Findings suggest communicating hydrocephalus. Interval removal ventricular catheter with gas now in the right frontal horn.  Intraventricular hemorrhage  and subarachnoid hemorrhage appears stable.  Negative for acute ischemic infarct.   Electronically Signed   By: Marlan Palau M.D.   On: 09/12/2014 13:24   Dg Chest Port 1 View  09/13/2014   CLINICAL DATA:  Fever.  EXAM: PORTABLE CHEST - 1 VIEW  COMPARISON:  09/06/2014  FINDINGS: Left central line and feeding tube remain in place, unchanged. Cardiomegaly with vascular congestion. Bilateral lower lobe airspace opacities have increased since prior study. Cannot exclude small bilateral effusions. No acute bony abnormality.  IMPRESSION: Increasing bibasilar atelectasis or infiltrates. Suspect small effusions.   Electronically Signed   By: Charlett Nose M.D.   On: 09/13/2014 01:44    MEDICATIONS                                                                                                                        Scheduled: . amiodarone  200 mg Per Tube Daily  . antiseptic oral rinse  7 mL Mouth Rinse q12n4p  . atorvastatin  80 mg Per Tube QPC supper  . chlorhexidine  15 mL Mouth Rinse BID  . free water  200 mL Per Tube 3 times per day  . insulin aspart  0-15 Units Subcutaneous 6 times per day  . lacosamide (VIMPAT) IV  100 mg Intravenous Q12H  . latanoprost  1 drop Right Eye QHS  . metoprolol tartrate  25 mg Per Tube BID  . phenytoin (DILANTIN) IV  100 mg Intravenous  3 times per day  . piperacillin-tazobactam (ZOSYN)  IV  3.375 g Intravenous 3 times per day  . vancomycin  750 mg Intravenous Q24H    ASSESSMENT/PLAN:                                                                                                            80 YO female initially was on coumadin with sub therapeutic INR presenting to hospital with UTI and AMS. Later found to have bilateral occipital intraventricular blood product with hydrocephalus. Intraventricular drain placed with no improvement of mental status.  CSF obtained and is not suggestive of infection.  c-EEG without electrographic seizures but consistent with severe  encephalopathy. I surmise that patient's persistently impaired mental status may be due to a toxic encephalopathy superimposed on recent cerebral insult in an elderly patient with poor cerebral reserve. Will consider stopping vimpat. Will continue to follow.  Wyatt Portelasvaldo Josely Moffat, MD [facility] [phone]  09/13/2014, 5:52 PM

## 2014-09-13 NOTE — Progress Notes (Signed)
Patient's breathing becoming more labored with use of accessory muscles. Snoring, tachypnic respirations.MD notified. ABG ordered. Ripley FraiseHarper, Saadiya Wilfong D

## 2014-09-13 NOTE — Progress Notes (Signed)
STROKE TEAM PROGRESS NOTE   HISTORY Susan Calhoun is an 80 y.o. female who was noted by family to be acting confused > 2 weeks prior. Patient was brought to the hospital where she was diagnosed with a UTI. She was given Rocephin and sent home on Keflex. Due to not being awake enough to take her medications she was brought back to the hospital. MRI brain showed "layering material in the occipital horns of lateral ventricles, at least partly hemorrhagic. Question focal thrombus in the atrium of the right lateral ventricle, possibly associated with the choroid. This could be the site of hemorrhage". Infectious disease was consulted and they do not feel this is bacterial meningoencephalitis and ABX were D/C'd. Neuro surgery placed a Ventriculostomy drain for HCP. Since drain placement her mentation has not fully resolved. Repeat head CT shows no worsening of HCP or blood products. Neurology was asked to further evaluate. At time of evaluation, patient intubated on the vent, Temp 100.8, WBC 12.2. CSF: WBC 168, red blood cells 70000, lymph 7, neutrophile 85, glucose 78, protein 139   SUBJECTIVE (INTERVAL HISTORY)   Patient remains somnolent and can be barely aroused. She had a second opinion consult from Dr. Cyril Mourningamillo. She was placed on long-term EEG recording which has been discontinued this morning. She was apparently started on impact last night by Dr. Roseanne RenoStewart but she seems no better. MRI scan of the brain was repeated yesterday which shows no surprises.   OBJECTIVE Temp:  [98 F (36.7 C)-102.5 F (39.2 C)] 100.1 F (37.8 C) (10/24 1150) Pulse Rate:  [100-122] 109 (10/24 1200) Cardiac Rhythm:  [-] Atrial fibrillation (10/24 1200) Resp:  [14-36] 30 (10/24 1200) BP: (91-174)/(30-91) 98/53 mmHg (10/24 1200) SpO2:  [97 %-100 %] 100 % (10/24 1200)   Recent Labs Lab 09/12/14 2018 09/12/14 2312 09/13/14 0322 09/13/14 0826 09/13/14 1149  GLUCAP 211* 159* 185* 233* 157*    Recent Labs Lab  09/07/14 0255 09/09/14 0500 09/10/14 0500 09/11/14 0500 09/12/14 2300  NA 149* 143 144 141 137  K 4.2 3.7 3.8 4.0 4.4  CL 118* 108 109 107 103  CO2 19 24 24 24 23   GLUCOSE 156* 151* 150* 121* 175*  BUN 42* 38* 35* 34* 34*  CREATININE 1.33* 1.26* 1.21* 1.21* 1.17*  CALCIUM 7.7* 7.9* 7.8* 7.7* 7.9*   No results found for this basename: AST, ALT, ALKPHOS, BILITOT, PROT, ALBUMIN,  in the last 168 hours  Recent Labs Lab 09/07/14 0255 09/09/14 0500 09/10/14 0500 09/11/14 0500 09/12/14 2300  WBC 12.2* 11.3* 10.2 10.2 13.2*  HGB 10.8* 9.6* 9.4* 9.3* 9.5*  HCT 33.6* 29.9* 28.7* 28.4* 29.7*  MCV 94.1 92.0 93.8 91.3 93.4  PLT 143* 163 188 197 233   No results found for this basename: CKTOTAL, CKMB, CKMBINDEX, TROPONINI,  in the last 168 hours No results found for this basename: LABPROT, INR,  in the last 72 hours No results found for this basename: COLORURINE, APPERANCEUR, LABSPEC, PHURINE, GLUCOSEU, HGBUR, BILIRUBINUR, KETONESUR, PROTEINUR, UROBILINOGEN, NITRITE, LEUKOCYTESUR,  in the last 72 hours     Component Value Date/Time   CHOL 204* 09/02/2014 0530   TRIG 149 09/02/2014 0530   HDL 64 09/02/2014 0530   CHOLHDL 3.2 09/02/2014 0530   VLDL 30 09/02/2014 0530   LDLCALC 110* 09/02/2014 0530   Lab Results  Component Value Date   HGBA1C 5.6 09/01/2014   No results found for this basename: labopia,  cocainscrnur,  labbenz,  amphetmu,  thcu,  labbarb  No results found for this basename: ETH,  in the last 168 hours    Ct Head Wo Contrast 09/08/2014  1. Unchanged dilatation of the third ventricle and likely no significant interval change in dilatation of the lateral ventricles. 2. Unchanged intraventricular blood. 3. Small volume of subarachnoid blood.    09/05/2014   1. Interval placement of right frontal approach ventriculostomy catheter with tip in the third ventricle. Ventricles remain dilated but are slightly decreased in size relative to previous MRI from 09/03/2014. 2.  Layering hemorrhage within the occipital horns of the lateral ventricles bilaterally, stable from prior. 3. Question trace acute subarachnoid hemorrhage within the right sylvian fissure.     08/31/2014  Mild, likely age-appropriate, centralized volume loss without acute intracranial process.     [name] Contrast 09/03/2014    Acute hydrocephalus, worsened since 3 days ago. Layering material in the occipital horns of lateral ventricles, at least partly hemorrhagic. Question focal thrombus in the atrium of the right lateral ventricle, possibly associated with the choroid. This could be the site of hemorrhage. No specific vascular lesion is identified. No intraparenchymal hemorrhage. The differential diagnosis does include meningitis.    Dg Chest Port 1 View 09/06/2014   Feeding tube with weighted tip in stomach.   09/04/2014    New left jugular central venous catheter tip overlies the distal SVC. No evidence of pneumothorax.  Stable cardiomegaly.  No active lung disease.     Dg Chest 2 View 08/31/2014    Borderline cardiomegaly and lung hyperexpansion without acute cardiopulmonary disease.   EEG 09/11/14 : This is a markedly abnormal electroencephalogram secondary to general background slowing with frequent intermittent discharges of triphasic morphology. Although this may be seen with a severe metabolic encephalopathy can not rule out the possibility that this may represent epileptic phenomenon. Clinical correlation recommended.   MRI brain 09/12/14 : Diffuse ventricular enlargement unchanged from CT scan yesterday.  Findings suggest communicating hydrocephalus. Interval removal ventricular catheter with gas now in the right frontal horn. Intraventricular hemorrhage and subarachnoid hemorrhage appears stable. Negative for acute ischemic infarct.    PHYSICAL EXAM Frail elderly Caucasian lady not in distress. . Afebrile. Head is nontraumatic. Neck is supple without bruit.  Cardiac exam no  murmur or gallop. Lungs are clear to auscultation. Distal pulses are well felt. Neurological Exam : Drowsy. Barely opens eyes to stimuli.not following commands. mumbles few words only Pupils irregular but reactive. Slight dysconjugate gaze with left eye hypotropia. Roving eye movements present. Does not blink to threat on either side. Fundi could not be visualized. Face is symmetric. Tongue is midline. Moves all 4 extremities purposefully against gravity but mild left sided weakness more in the arm than the leg. Left plantar upgoing right equivocal. Sensation and coordination cannot be reliably tested. Gait was not tested.  ASSESSMENT/PLAN Susan Calhoun is a 80 y.o. female with history of altered mental status following UTI, seen in the ED 08/31/2014 - admitted 09/01/2014 with confusion. Patient was on coumadin at that time for atrial fibrillation. INR no checked. Administered Levaquin. BP elevated on the 11th at 166/44, max BP next day 189/112. In retrospect, initial CT with L IVH. Repeat CT done during workup found to have worsening of IVH with bilateral occipital intraventricular blood with hydrocephalus. Hypertension and coumadin associated coagulapathy etiology of hemorrhage.   Stroke:  Non-dominant IVH secondary to coumadin associated coagulopathy in setting of uncontrolled hypertension and levaquin use.   Intraventricular drain placed with no improvement of  mental status.  CSF without source of infection.   SCDs for VTE prophylaxis   NPO  Up with assistance  warfarin prior to admission  Ongoing aggressive risk factor management  Resultant confusion, unable to follow commands mental status disproportionate to amount of IVH and CSF pressure.    Therapy recommendations:  SNF  Disposition:  SNF Abnormal EEG showing frequent triphasic discharges ? Epileptiform - Continue dilantin 100 mg q 8 hrly. Will get Neurohospitalist second opinion per family wishes. Long term 24 EEG done  results pending Atrial Fibrillation  Home meds:  Warfarin prior to admission  INR 1.51 on admission 10/12. Not checked 10/11 when seen in ED  No longer an anticoagulation candidate secondary to hemorrhage   Hypertension  Stable  Hyperlipidemia  Home meds:  lipitor 80 resumed in hospital  LDL 110 goal < 70  Continue statin at discharge  Diabetes  HgbA1c 5.6 goal < 7.0  Controlled  Other Stroke Risk Factors Advanced age   Obesity, Body mass index is 26.97 kg/(m^2).    Coronary artery disease - s/p CABG ICA stenosis s/p CEA 2015 PVD  Other Active Problems  UTI with pyelonephritis  CKD   Hospital day # 12 I spoke to the granddaughter over the phone and updated her. Patient has persistent encephalopathy with etiology not clear. I did discontinue Dilantin and  as   Vimpat has been started and Dilantin was not working. I will review  need for antibiotics and amiodarone and discuss with pharmacy      This patient is critically ill and at significant risk of neurological worsening, death and care requires constant monitoring of vital signs, hemodynamics,respiratory and cardiac monitoring,review of multiple databases, neurological assessment, discussion with family, other specialists and medical decision making of high complexity.I have made any additions or clarifications directly to the above note.  I spent 38 minutes of neurocritical care time  in the care of  this patient.   Delia HeadyPramod Victormanuel Mclure, MD Medical Director [facility] Pager: [phone] 09/13/2014 12:46 PM    To contact Stroke Continuity provider, please refer to [email or web address]. After hours, contact General Neurology

## 2014-09-14 ENCOUNTER — Inpatient Hospital Stay (HOSPITAL_COMMUNITY): Payer: Medicare (Managed Care)

## 2014-09-14 ENCOUNTER — Encounter (HOSPITAL_COMMUNITY): Payer: Self-pay | Admitting: Radiology

## 2014-09-14 LAB — CBC
HEMATOCRIT: 26.2 % — AB (ref 36.0–46.0)
Hemoglobin: 8.5 g/dL — ABNORMAL LOW (ref 12.0–15.0)
MCH: 29.3 pg (ref 26.0–34.0)
MCHC: 32.4 g/dL (ref 30.0–36.0)
MCV: 90.3 fL (ref 78.0–100.0)
PLATELETS: 248 10*3/uL (ref 150–400)
RBC: 2.9 MIL/uL — ABNORMAL LOW (ref 3.87–5.11)
RDW: 17.3 % — ABNORMAL HIGH (ref 11.5–15.5)
WBC: 11.9 10*3/uL — AB (ref 4.0–10.5)

## 2014-09-14 LAB — GLUCOSE, CAPILLARY
GLUCOSE-CAPILLARY: 125 mg/dL — AB (ref 70–99)
GLUCOSE-CAPILLARY: 135 mg/dL — AB (ref 70–99)
GLUCOSE-CAPILLARY: 137 mg/dL — AB (ref 70–99)
GLUCOSE-CAPILLARY: 143 mg/dL — AB (ref 70–99)
GLUCOSE-CAPILLARY: 144 mg/dL — AB (ref 70–99)
Glucose-Capillary: 138 mg/dL — ABNORMAL HIGH (ref 70–99)
Glucose-Capillary: 137 mg/dL — ABNORMAL HIGH (ref 70–99)

## 2014-09-14 LAB — BASIC METABOLIC PANEL
ANION GAP: 13 (ref 5–15)
BUN: 42 mg/dL — ABNORMAL HIGH (ref 6–23)
CHLORIDE: 100 meq/L (ref 96–112)
CO2: 23 mEq/L (ref 19–32)
Calcium: 7.6 mg/dL — ABNORMAL LOW (ref 8.4–10.5)
Creatinine, Ser: 1.25 mg/dL — ABNORMAL HIGH (ref 0.50–1.10)
GFR calc Af Amer: 44 mL/min — ABNORMAL LOW (ref >90)
GFR calc non Af Amer: 38 mL/min — ABNORMAL LOW (ref >90)
Glucose, Bld: 137 mg/dL — ABNORMAL HIGH (ref 70–99)
Potassium: 3.8 mEq/L (ref 3.7–5.3)
SODIUM: 136 meq/L — AB (ref 137–147)

## 2014-09-14 LAB — CSF CULTURE: Culture: NO GROWTH

## 2014-09-14 LAB — CSF CULTURE W GRAM STAIN

## 2014-09-14 MED ORDER — DILTIAZEM HCL 30 MG PO TABS
30.0000 mg | ORAL_TABLET | Freq: Three times a day (TID) | ORAL | Status: DC
  Administered 2014-09-14 – 2014-09-17 (×10): 30 mg via ORAL
  Filled 2014-09-14 (×12): qty 1

## 2014-09-14 MED ORDER — SODIUM CHLORIDE 0.9 % IV SOLN
INTRAVENOUS | Status: DC
  Administered 2014-09-14 – 2014-09-16 (×2): via INTRAVENOUS
  Administered 2014-09-20: 500 mL via INTRAVENOUS

## 2014-09-14 MED ORDER — SODIUM CHLORIDE 0.9 % IV SOLN
INTRAVENOUS | Status: DC
  Administered 2014-09-15 – 2014-09-16 (×2): via INTRAVENOUS

## 2014-09-14 NOTE — Progress Notes (Signed)
NEURO HOSPITALIST PROGRESS NOTE   SUBJECTIVE:                                                                                                                        No obvious neurological improvement.  OBJECTIVE:                                                                                                                           Vital signs in last 24 hours: Temp:  [98.4 F (36.9 C)-100.1 F (37.8 C)] 99.5 F (37.5 C) (10/25 1559) Pulse Rate:  [57-122] 115 (10/25 1700) Resp:  [15-33] 24 (10/25 1700) BP: (97-156)/(41-117) 113/72 mmHg (10/25 1700) SpO2:  [96 %-100 %] 100 % (10/25 1700) Weight:  [66 kg (145 lb 8.1 oz)] 66 kg (145 lb 8.1 oz) (10/25 0400)  Intake/Output from previous day: 10/24 0701 - 10/25 0700 In: 2130.7 [I.V.:75.7; NG/GT:1710; IV Piggyback:345] Out: 1135 [Urine:1135] Intake/Output this shift: Total I/O In: 787.5 [I.V.:275; NG/GT:450; IV Piggyback:62.5] Out: 600 [Urine:600] Nutritional status:    Past Medical History  Diagnosis Date  . Diabetes mellitus with diabetic polyneuropathy   . A-fib   . Renal disorder   . Hypertension   . Anemia   . Glaucoma     right eye   . Carotid artery stenosis   . PVD (peripheral vascular disease)   Physical exam: no apparent distress.  Head: normocephalic.  Neck: supple, no bruits, no JVD.  Cardiac: no murmurs.  Lungs: clear.  Abdomen: soft, no tender, no mass.  Extremities: no edema.  Neurologic Exam:  General: NAD  Mental Status:  Unresponsive to verbal commands.  Cranial Nerves:  II: Discs flat bilaterally; doesn't blink to threat , left pupil asymmetric (S/P surgery) right pupil shows 2mm and symmetric  III,IV, VI: ptosis not present, extra-ocular motions intact bilaterally and closes eyes tightly to light shown in eyes.  V,VII: smile symmetric, winces to pain  VIII: hearing no tested  IX,X: gag reflex present  XI: bilateral shoulder shrug  XII: midline tongue  extension  Motor:  moving left upper and lower extremities spontaneously but no movements of the let arm even upon  noxious stimuli  Sensory: withdraw to pain except the left arm Deep Tendon  Reflexes:  1+ throughout  Plantars:  Right: downgoing Left: downgoing  Cerebellar:  Unable to assess  Gait: unable to assess   Lab Results: Lab Results  Component Value Date/Time   CHOL 204* 09/02/2014  5:30 AM   Lipid Panel No results found for this basename: CHOL, TRIG, HDL, CHOLHDL, VLDL, LDLCALC,  in the last 72 hours  Studies/Results: Ct Head Wo Contrast  09/14/2014   CLINICAL DATA:  Intraventricular hemorrhage. Altered mental status. Encephalopathy.  EXAM: CT HEAD WITHOUT CONTRAST  TECHNIQUE: Contiguous axial images were obtained from the base of the skull through the vertex without intravenous contrast.  COMPARISON:  CT head 09/11/2014 and MRI brain 09/12/2014.  FINDINGS: The right frontal ventriculostomy catheter has been removed. There is some residual edema along the ventriculostomy tract. Ventricular size is stable is measured across the frontal horns. Third ventricular size is stable is well there is some blood layering within the lateral ventricles bilaterally. The fourth ventricle is more normal in size.  Periventricular white matter changes are stable. A focal density in the right parietal lobe is unchanged. This likely represents a small calcification given its stability over time and lack of significant artifact on the MRI. Focal hemorrhage within the left cerebral peduncle is stable.  IMPRESSION: 1. Stable appearance of hydrocephalus involving the lateral and third ventricles with some residual layering blood products. 2. Persisting gas within the frontal horn of the right lateral ventricle following removal of the ventriculostomy catheter. 3. Stable mild diffuse white matter disease without evidence for acute infarct or significant interval change. 4. Stable focal hemorrhage within the  left cerebral peduncle.   Electronically Signed   By: Gennette Pachris  Mattern M.D.   On: 09/14/2014 11:13   Dg Chest Port 1 View  09/14/2014   CLINICAL DATA:  Shortness of breath  EXAM: PORTABLE CHEST - 1 VIEW  COMPARISON:  09/13/2012  FINDINGS: Stable support apparatus. Previous coronary bypass noted. Mild cardiomegaly with persistent vascular congestion and small effusions. Stable basilar opacities, favor atelectasis over pneumonia. No pneumothorax. Degenerative changes of the spine.  IMPRESSION: Stable cardiomegaly with vascular congestion  Persistent mild bibasilar atelectasis, less likely airspace disease.  Small pleural effusions.   Electronically Signed   By: Ruel Favorsrevor  Shick M.D.   On: 09/14/2014 08:50   Dg Chest Port 1 View  09/13/2014   CLINICAL DATA:  Fever.  EXAM: PORTABLE CHEST - 1 VIEW  COMPARISON:  09/06/2014  FINDINGS: Left central line and feeding tube remain in place, unchanged. Cardiomegaly with vascular congestion. Bilateral lower lobe airspace opacities have increased since prior study. Cannot exclude small bilateral effusions. No acute bony abnormality.  IMPRESSION: Increasing bibasilar atelectasis or infiltrates. Suspect small effusions.   Electronically Signed   By: Charlett NoseKevin  Dover M.D.   On: 09/13/2014 01:44    MEDICATIONS  Scheduled: . amiodarone  200 mg Per Tube Daily  . antiseptic oral rinse  7 mL Mouth Rinse q12n4p  . chlorhexidine  15 mL Mouth Rinse BID  . diltiazem  30 mg Oral Q8H  . insulin aspart  0-15 Units Subcutaneous 6 times per day  . latanoprost  1 drop Right Eye QHS  . metoprolol tartrate  25 mg Per Tube BID  . piperacillin-tazobactam (ZOSYN)  IV  3.375 g Intravenous 3 times per day  . vancomycin  750 mg Intravenous Q24H    ASSESSMENT/PLAN:                                                                                                            80  YO female initially was on coumadin with sub therapeutic INR presenting to hospital with UTI and AMS. Later found to have bilateral occipital intraventricular blood product with hydrocephalus. Intraventricular drain placed with no improvement of mental status.  CSF obtained and is not suggestive of infection. No striking metabolic derangements. c-EEG without electrographic seizures but consistent with severe encephalopathy.  I surmise that patient's persistently impaired mental status may be due to a toxic encephalopathy superimposed on recent cerebrovascular insult in an elderly patient with poor cerebral reserve. Will stop vimpat. Will follow up.  Wyatt Portela, MD [facility] [phone]  09/14/2014, 6:15 PM

## 2014-09-14 NOTE — Progress Notes (Signed)
5 mg Metoprolol given at 1447 for HR of 122. Contacted [facility] regarding patient's heart rate and increased ectopy in spite of given medication. No new orders at this time. Will continue to monitor patient and update as needed.

## 2014-09-14 NOTE — Progress Notes (Signed)
STROKE TEAM PROGRESS NOTE   HISTORY Susan Calhoun is an 80 y.o. female who was noted by family to be acting confused > 2 weeks prior. Patient was brought to the hospital where she was diagnosed with a UTI. She was given Rocephin and sent home on Keflex. Due to not being awake enough to take her medications she was brought back to the hospital. MRI brain showed "layering material in the occipital horns of lateral ventricles, at least partly hemorrhagic. Question focal thrombus in the atrium of the right lateral ventricle, possibly associated with the choroid. This could be the site of hemorrhage". Infectious disease was consulted and they do not feel this is bacterial meningoencephalitis and ABX were D/C'd. Neuro surgery placed a Ventriculostomy drain for HCP. Since drain placement her mentation has not fully resolved. Repeat head CT shows no worsening of HCP or blood products. Neurology was asked to further evaluate. At time of evaluation, patient intubated on the vent, Temp 100.8, WBC 12.2. CSF: WBC 168, red blood cells 70000, lymph 7, neutrophile 85, glucose 78, protein 139   SUBJECTIVE (INTERVAL HISTORY) Patient remains obtunded and eyes closed and not able to be aroused. She had a second opinion consult from Dr. Cyril Mourningamillo. She was placed on long-term EEG recording which has been discontinued this morning. As per Dr. Cyril Mourningamillo, there was no seizure on LTM EEG but slow wave. Will d/c dilantin and vimpat. On tube feeding.   OBJECTIVE Temp:  [98.4 F (36.9 C)-100.1 F (37.8 C)] 99.5 F (37.5 C) (10/25 1559) Pulse Rate:  [57-122] 111 (10/25 1500) Cardiac Rhythm:  [-] Atrial fibrillation (10/25 1200) Resp:  [15-33] 23 (10/25 1500) BP: (103-156)/(41-117) 110/58 mmHg (10/25 1500) SpO2:  [96 %-100 %] 100 % (10/25 1500) Weight:  [145 lb 8.1 oz (66 kg)] 145 lb 8.1 oz (66 kg) (10/25 0400)   Recent Labs Lab 09/13/14 1945 09/14/14 0007 09/14/14 0408 09/14/14 0746 09/14/14 1133  GLUCAP 177* 143*  137* 135* 125*    Recent Labs Lab 09/09/14 0500 09/10/14 0500 09/11/14 0500 09/12/14 2300 09/14/14 0815  NA 143 144 141 137 136*  K 3.7 3.8 4.0 4.4 3.8  CL 108 109 107 103 100  CO2 24 24 24 23 23   GLUCOSE 151* 150* 121* 175* 137*  BUN 38* 35* 34* 34* 42*  CREATININE 1.26* 1.21* 1.21* 1.17* 1.25*  CALCIUM 7.9* 7.8* 7.7* 7.9* 7.6*   No results found for this basename: AST, ALT, ALKPHOS, BILITOT, PROT, ALBUMIN,  in the last 168 hours  Recent Labs Lab 09/09/14 0500 09/10/14 0500 09/11/14 0500 09/12/14 2300 09/14/14 0815  WBC 11.3* 10.2 10.2 13.2* 11.9*  HGB 9.6* 9.4* 9.3* 9.5* 8.5*  HCT 29.9* 28.7* 28.4* 29.7* 26.2*  MCV 92.0 93.8 91.3 93.4 90.3  PLT 163 188 197 233 248   No results found for this basename: CKTOTAL, CKMB, CKMBINDEX, TROPONINI,  in the last 168 hours No results found for this basename: LABPROT, INR,  in the last 72 hours No results found for this basename: COLORURINE, APPERANCEUR, LABSPEC, PHURINE, GLUCOSEU, HGBUR, BILIRUBINUR, KETONESUR, PROTEINUR, UROBILINOGEN, NITRITE, LEUKOCYTESUR,  in the last 72 hours     Component Value Date/Time   CHOL 204* 09/02/2014 0530   TRIG 149 09/02/2014 0530   HDL 64 09/02/2014 0530   CHOLHDL 3.2 09/02/2014 0530   VLDL 30 09/02/2014 0530   LDLCALC 110* 09/02/2014 0530   Lab Results  Component Value Date   HGBA1C 5.6 09/01/2014   No results found for this basename: labopia,  cocainscrnur,  labbenz,  amphetmu,  thcu,  labbarb    No results found for this basename: ETH,  in the last 168 hours  I have personally reviewed the radiological images below and agree with the radiology interpretations.  Ct Head Wo Contrast 09/14/14-  1. Stable appearance of hydrocephalus involving the lateral and third ventricles with some residual layering blood products.  2. Persisting gas within the frontal horn of the right lateral ventricle following removal of the ventriculostomy catheter.  3. Stable mild diffuse white matter disease  without evidence for acute infarct or significant interval change.  4. Stable focal hemorrhage within the left cerebral peduncle. 09/11/14 - Normal ventricular enlargement is stable. Intraventricular hemorrhage slightly improved. Small area of hemorrhage or calcification in the right parietal lobe is stable.  09/08/2014  1. Unchanged dilatation of the third ventricle and likely no significant interval change in dilatation of the lateral ventricles. 2. Unchanged intraventricular blood. 3. Small volume of subarachnoid blood.    09/05/2014   1. Interval placement of right frontal approach ventriculostomy catheter with tip in the third ventricle. Ventricles remain dilated but are slightly decreased in size relative to previous MRI from 09/03/2014. 2. Layering hemorrhage within the occipital horns of the lateral ventricles bilaterally, stable from prior. 3. Question trace acute subarachnoid hemorrhage within the right sylvian fissure.     08/31/2014  Mild, likely age-appropriate, centralized volume loss without acute intracranial process.     [name] Contrast 09/12/14 Diffuse ventricular enlargement unchanged from CT scan yesterday. Findings suggest communicating hydrocephalus. Interval removal ventricular catheter with gas now in the right frontal horn. Intraventricular hemorrhage and subarachnoid hemorrhage appears  stable. Negative for acute ischemic infarct. 09/03/2014    Acute hydrocephalus, worsened since 3 days ago. Layering material in the occipital horns of lateral ventricles, at least partly hemorrhagic. Question focal thrombus in the atrium of the right lateral ventricle, possibly associated with the choroid. This could be the site of hemorrhage. No specific vascular lesion is identified. No intraparenchymal hemorrhage. The differential diagnosis does include meningitis.    Dg Chest Port 1 View 09/06/2014   Feeding tube with weighted tip in stomach.   09/04/2014    New left jugular central  venous catheter tip overlies the distal SVC. No evidence of pneumothorax.  Stable cardiomegaly.  No active lung disease.     Dg Chest 2 View 08/31/2014    Borderline cardiomegaly and lung hyperexpansion without acute cardiopulmonary disease.    EEG 09/11/14 : This is a markedly abnormal electroencephalogram secondary to general background slowing with frequent intermittent discharges of triphasic morphology. Although this may be seen with a severe metabolic encephalopathy can not rule out the possibility that this may represent epileptic phenomenon. Clinical correlation recommended.    PHYSICAL EXAM Frail elderly Caucasian lady not in distress. Afebrile. Head is nontraumatic. Neck is supple without bruit.  Cardiac exam no murmur or gallop, but tachycardia. Lungs are clear to auscultation. Distal pulses are well felt.  Neurological Exam: obtunded, not open eyes on voice or pain stimulation, not following commands. Pupils exam showed left surgical pupil, left sluggish to light. Slight dysconjugate gaze with left eye hypotropia. Roving eye movements present. Does not blink to threat on either side. Fundi could not be visualized. Face is symmetric. Tongue is midline. BLE withdraw to pain but not against gravity, LUE flaccid 0/5 on pain stimulation. RUE extension on pain stimulation and rigidity with cog-wheeling. Left plantar upgoing right equivocal. Sensation and coordination cannot be  reliably tested. Gait was not tested.  ASSESSMENT/PLAN Susan Calhoun is a 80 y.o. female with history of altered mental status following UTI, seen in the ED 08/31/2014 - admitted 09/01/2014 with confusion. Patient was on coumadin at that time for atrial fibrillation. INR 1.84. Administered Levaquin. BP elevated on the 11th at 166/44, max BP next day 189/112. Pt INR trending up and 2.10 on 09/03/14. In retrospect, initial CT with L IVH. Repeat CT done during workup found to have worsening of IVH with bilateral  occipital intraventricular blood with hydrocephalus. Hypertension and coumadin associated coagulapathy etiology of hemorrhage.   Stroke:  Non-dominant IVH secondary to coumadin associated coagulopathy in setting of uncontrolled hypertension.  Intraventricular drain placed with no improvement of mental status.  CSF without source of infection.   EVD removed  SCDs for VTE prophylaxis NPO on TF Need to consider PEG  Bedrest    warfarin prior to admission, no antithrombotics now.  Resultant confusion, unable to follow commands mental status disproportionate to amount of IVH and CSF pressure.    Therapy recommendations:  SNF  Disposition:  SNF  AMS - likely due to IVH and hydrocephalus - LTM EEG did not detect seizure activity  - d/c vimpat and dilantin - Dr. Cyril Mourningamillo help appreciated  Atrial Fibrillation  Home meds:  Warfarin prior to admission  INR 1.51 on admission 10/12. High as 2.10 on 09/03/14.   No longer an anticoagulation candidate secondary to hemorrhage  Now in sinus but tachy  On metoprolol, add cardiazem  Also on amiodarone   Hypertension  Stable  BP goal < 160  On metoprolol  Leukocytosis  - WBC down trending 13.2->11.9 - culture sent so far negative - on vancon/zosyn - afebrile  Hyponatremia with uptrending BUN and Cre - d/c free water - NS @ 30cc - continue TF @ 45cc - daily lab  Hyperlipidemia  Home meds:  lipitor 80   LDL 110 goal < 70  D/c lipitor due to IVH  Other Stroke Risk Factors Advanced age   Obesity, Body mass index is 29.37 kg/(m^2).    Coronary artery disease - s/p CABG ICA stenosis s/p CEA 2015 PVD  Other Active Problems  UTI with pyelonephritis  CKD   Hospital day # 13  This patient is critically ill and at significant risk of neurological worsening, death and care requires constant monitoring of vital signs, hemodynamics,respiratory and cardiac monitoring,review of multiple databases, neurological  assessment, discussion with family, other specialists and medical decision making of high complexity.I have made any additions or clarifications directly to the above note.  I spent 45 minutes of neurocritical care time  in the care of  this patient.   Marvel PlanJindong Jaxxon Naeem, MD PhD Stroke Neurology 09/14/2014 4:52 PM     To contact Stroke Continuity provider, please refer to [email or web address]. After hours, contact General Neurology

## 2014-09-15 LAB — CBC
HCT: 24.9 % — ABNORMAL LOW (ref 36.0–46.0)
HEMOGLOBIN: 8.1 g/dL — AB (ref 12.0–15.0)
MCH: 29.8 pg (ref 26.0–34.0)
MCHC: 32.5 g/dL (ref 30.0–36.0)
MCV: 91.5 fL (ref 78.0–100.0)
Platelets: 244 10*3/uL (ref 150–400)
RBC: 2.72 MIL/uL — ABNORMAL LOW (ref 3.87–5.11)
RDW: 17.5 % — AB (ref 11.5–15.5)
WBC: 11.2 10*3/uL — ABNORMAL HIGH (ref 4.0–10.5)

## 2014-09-15 LAB — BASIC METABOLIC PANEL
Anion gap: 14 (ref 5–15)
BUN: 45 mg/dL — ABNORMAL HIGH (ref 6–23)
CO2: 22 mEq/L (ref 19–32)
Calcium: 7.6 mg/dL — ABNORMAL LOW (ref 8.4–10.5)
Chloride: 103 mEq/L (ref 96–112)
Creatinine, Ser: 1.34 mg/dL — ABNORMAL HIGH (ref 0.50–1.10)
GFR calc Af Amer: 41 mL/min — ABNORMAL LOW (ref >90)
GFR, EST NON AFRICAN AMERICAN: 35 mL/min — AB (ref >90)
Glucose, Bld: 124 mg/dL — ABNORMAL HIGH (ref 70–99)
POTASSIUM: 4 meq/L (ref 3.7–5.3)
SODIUM: 139 meq/L (ref 137–147)

## 2014-09-15 LAB — URINE CULTURE: Colony Count: >=100000

## 2014-09-15 LAB — VANCOMYCIN, TROUGH: Vancomycin Tr: 12.8 ug/mL (ref 10.0–20.0)

## 2014-09-15 LAB — GLUCOSE, CAPILLARY
GLUCOSE-CAPILLARY: 134 mg/dL — AB (ref 70–99)
Glucose-Capillary: 141 mg/dL — ABNORMAL HIGH (ref 70–99)
Glucose-Capillary: 124 mg/dL — ABNORMAL HIGH (ref 70–99)
Glucose-Capillary: 129 mg/dL — ABNORMAL HIGH (ref 70–99)
Glucose-Capillary: 137 mg/dL — ABNORMAL HIGH (ref 70–99)
Glucose-Capillary: 104 mg/dL — ABNORMAL HIGH (ref 70–99)

## 2014-09-15 MED ORDER — VANCOMYCIN HCL IN DEXTROSE 1-5 GM/200ML-% IV SOLN
1000.0000 mg | INTRAVENOUS | Status: DC
  Administered 2014-09-16 – 2014-09-19 (×4): 1000 mg via INTRAVENOUS
  Filled 2014-09-15 (×6): qty 200

## 2014-09-15 NOTE — Progress Notes (Signed)
Physical Therapy Treatment Patient Details Name: Susan Calhoun MRN: [id number] DOB: 06/08/1928 Today's Date: 09/15/2014    History of Present Illness 80 yo wf admitted on 09/01/14 with MS changes. Initial CT showed some ventriculomegaly. Was being treated for UTI. MS changes worsened and an MRI was performed revelaed IVH, Ventricular drain placed on 09/04/14.    PT Comments    Pt without participation in mobility today.  Pt lethargic and requiring total A for all mobility.  Will continue to follow.    Follow Up Recommendations  SNF     Equipment Recommendations   (TBD)    Recommendations for Other Services       Precautions / Restrictions Precautions Precautions: Fall Restrictions Weight Bearing Restrictions: No    Mobility  Bed Mobility Overal bed mobility: Needs Assistance;+2 for physical assistance Bed Mobility: Supine to Sit;Sit to Supine     Supine to sit: Total assist;+2 for physical assistance Sit to supine: Total assist;+2 for physical assistance   General bed mobility comments: pt without any participation in mobility today.    Transfers                    Ambulation/Gait                 Stairs            Wheelchair Mobility    Modified Rankin (Stroke Patients Only)       Balance Overall balance assessment: Needs assistance Sitting-balance support: Bilateral upper extremity supported;Feet supported Sitting balance-Leahy Scale: Zero Sitting balance - Comments: pt tends total A to maintain sitting balance.  No participation noted.                              Cognition Arousal/Alertness: Lethargic Behavior During Therapy: Flat affect Overall Cognitive Status: Difficult to assess                 General Comments: pt lethargic without any participation or eye opening today.      Exercises      General Comments        Pertinent Vitals/Pain Pain Assessment: Faces Faces Pain Scale: Hurts a little  bit Pain Location: Grimaced once during bed mobility, but no other s/s of pain.      Home Living                      Prior Function            PT Goals (current goals can now be found in the care plan section) Acute Rehab PT Goals Patient Stated Goal: unable to state PT Goal Formulation: Patient unable to participate in goal setting Time For Goal Achievement: 09/18/14 Potential to Achieve Goals: Fair Progress towards PT goals: Not progressing toward goals - comment (Lethargy)    Frequency  Min 2X/week    PT Plan Current plan remains appropriate    Co-evaluation             End of Session   Activity Tolerance: Patient limited by lethargy Patient left: in bed;with call bell/phone within reach;with bed alarm set     Time: [id number] PT Time Calculation (min): 15 min  Charges:  $Therapeutic Activity: 8-22 mins                    G CodesSunny Calhoun:      Susan Calhoun, [place] CarolinaPT [phone] 09/15/2014, 3:29 PM

## 2014-09-15 NOTE — Progress Notes (Signed)
NEURO HOSPITALIST PROGRESS NOTE   SUBJECTIVE:                                                                                                                        No obvious neurological improvement. Remains obtunded with eyes closed. Not following commands or responding to verbal stimuli. LT EEG shows no seizure activity but consistent with a severe encephalopathy.   OBJECTIVE:                                                                                                                           Vital signs in last 24 hours: Temp:  [98.4 F (36.9 C)-100.6 F (38.1 C)] 98.4 F (36.9 C) (10/26 0748) Pulse Rate:  [91-118] 101 (10/26 1000) Resp:  [17-47] 24 (10/26 1000) BP: (97-151)/(47-78) 148/55 mmHg (10/26 1006) SpO2:  [100 %] 100 % (10/26 1000) Weight:  [66.3 kg (146 lb 2.6 oz)] 66.3 kg (146 lb 2.6 oz) (10/26 0400)  Intake/Output from previous day: 10/25 0701 - 10/26 0700 In: 2282.5 [I.V.:695; NG/GT:1300; IV Piggyback:287.5] Out: 1235 [Urine:1235] Intake/Output this shift: Total I/O In: 262.5 [I.V.:90; NG/GT:135; IV Piggyback:37.5] Out: 300 [Urine:300] Nutritional status:    Past Medical History  Diagnosis Date  . Diabetes mellitus with diabetic polyneuropathy   . A-fib   . Renal disorder   . Hypertension   . Anemia   . Glaucoma     right eye   . Carotid artery stenosis   . PVD (peripheral vascular disease)   Physical exam: no apparent distress.  Head: normocephalic.  Neck: supple, no bruits, no JVD.  Cardiac: no murmurs.  Lungs: clear.  Abdomen: soft, no tender, no mass.  Extremities: no edema.  Neurologic Exam:  General: NAD  Mental Status:  Unresponsive to verbal commands.  Cranial Nerves:  II: doesn't blink to threat , left pupil asymmetric (S/P surgery) right pupil shows 2mm and symmetric  III,IV, VI: ptosis not present, extra-ocular motions intact bilaterally and closes eyes tightly to light shown in eyes.   V,VII: smile symmetric, winces to pain  VIII: hearing no tested  IX,X: gag reflex present  XI: bilateral shoulder shrug  XII: midline tongue extension  Motor:  Minimal movement but moving right upper and lower  extremities spontaneously but no movements of the let arm even upon  noxious stimuli  Sensory: withdraw to pain except the left arm Deep Tendon Reflexes:  1+ throughout  Plantars:  Right: downgoing Left: downgoing  Cerebellar:  Unable to assess  Gait: unable to assess   Lab Results: Lab Results  Component Value Date/Time   CHOL 204* 09/02/2014  5:30 AM   Lipid Panel No results found for this basename: CHOL, TRIG, HDL, CHOLHDL, VLDL, LDLCALC,  in the last 72 hours  Studies/Results: Ct Head Wo Contrast  09/14/2014   CLINICAL DATA:  Intraventricular hemorrhage. Altered mental status. Encephalopathy.  EXAM: CT HEAD WITHOUT CONTRAST  TECHNIQUE: Contiguous axial images were obtained from the base of the skull through the vertex without intravenous contrast.  COMPARISON:  CT head 09/11/2014 and MRI brain 09/12/2014.  FINDINGS: The right frontal ventriculostomy catheter has been removed. There is some residual edema along the ventriculostomy tract. Ventricular size is stable is measured across the frontal horns. Third ventricular size is stable is well there is some blood layering within the lateral ventricles bilaterally. The fourth ventricle is more normal in size.  Periventricular white matter changes are stable. A focal density in the right parietal lobe is unchanged. This likely represents a small calcification given its stability over time and lack of significant artifact on the MRI. Focal hemorrhage within the left cerebral peduncle is stable.  IMPRESSION: 1. Stable appearance of hydrocephalus involving the lateral and third ventricles with some residual layering blood products. 2. Persisting gas within the frontal horn of the right lateral ventricle following removal of the  ventriculostomy catheter. 3. Stable mild diffuse white matter disease without evidence for acute infarct or significant interval change. 4. Stable focal hemorrhage within the left cerebral peduncle.   Electronically Signed   By: Gennette Pac M.D.   On: 09/14/2014 11:13   Dg Chest Port 1 View  09/14/2014   CLINICAL DATA:  Shortness of breath  EXAM: PORTABLE CHEST - 1 VIEW  COMPARISON:  09/13/2012  FINDINGS: Stable support apparatus. Previous coronary bypass noted. Mild cardiomegaly with persistent vascular congestion and small effusions. Stable basilar opacities, favor atelectasis over pneumonia. No pneumothorax. Degenerative changes of the spine.  IMPRESSION: Stable cardiomegaly with vascular congestion  Persistent mild bibasilar atelectasis, less likely airspace disease.  Small pleural effusions.   Electronically Signed   By: Ruel Favors M.D.   On: 09/14/2014 08:50    MEDICATIONS                                                                                                                        Scheduled: . amiodarone  200 mg Per Tube Daily  . antiseptic oral rinse  7 mL Mouth Rinse q12n4p  . chlorhexidine  15 mL Mouth Rinse BID  . diltiazem  30 mg Oral Q8H  . insulin aspart  0-15 Units Subcutaneous 6 times per day  . latanoprost  1 drop Right Eye QHS  . metoprolol tartrate  25 mg Per Tube BID  . piperacillin-tazobactam (ZOSYN)  IV  3.375 g Intravenous 3 times per day  . vancomycin  750 mg Intravenous Q24H    ASSESSMENT/PLAN:                                                                                                            80 YO female initially was on coumadin with sub therapeutic INR presenting to hospital with UTI and AMS. Later found to have bilateral occipital intraventricular blood product with hydrocephalus. Intraventricular drain placed with no improvement of mental status.  CSF obtained and is not suggestive of infection. No striking metabolic derangements on lab work.   c-EEG without electrographic seizures but consistent with severe encephalopathy.  Patient's persistently impaired mental status may be due to a toxic encephalopathy superimposed on recent cerebrovascular insult in an elderly patient with poor cerebral reserve. AEDs discontinued. Will follow up.  Elspeth Choeter Hanne Kegg, DO [facility] [phone]  If 7pm- 7am, please page neurology on call as listed in AMION.   09/15/2014, 11:22 AM

## 2014-09-15 NOTE — Progress Notes (Signed)
ANTIBIOTIC CONSULT NOTE - FOLLOW UP  Pharmacy Consult for vancomycin Indication: HCAP/fever  Labs:  Recent Labs  09/14/14 0815 09/15/14 0440  WBC 11.9* 11.2*  HGB 8.5* 8.1*  PLT 248 244  CREATININE 1.25* 1.34*   Estimated Creatinine Clearance: 25.9 ml/min (by C-G formula based on Cr of 1.34).  Recent Labs  09/15/14 2240  VANCOTROUGH 12.8      Assessment: 80yo female subtherapeutic on vancomycin with initial dosing for fever and leukocytosis.  Goal of Therapy:  Vancomycin trough level 15-20 mcg/ml  Plan:  Will increase vanc to 1000mg  IV Q24H for calculated trough ~17 and continue to monitor.  Vernard GamblesVeronda Selden Noteboom, PharmD, BCPS  09/15/2014,11:45 PM

## 2014-09-15 NOTE — Progress Notes (Signed)
STROKE TEAM PROGRESS NOTE   HISTORY Susan Calhoun is an 80 y.o. female who was noted by family to be acting confused > 2 weeks prior. Patient was brought to the hospital where she was diagnosed with a UTI. She was given Rocephin and sent home on Keflex. Due to not being awake enough to take her medications she was brought back to the hospital. MRI brain showed "layering material in the occipital horns of lateral ventricles, at least partly hemorrhagic. Question focal thrombus in the atrium of the right lateral ventricle, possibly associated with the choroid. This could be the site of hemorrhage". Infectious disease was consulted and they do not feel this is bacterial meningoencephalitis and ABX were D/C'd. Neuro surgery placed a Ventriculostomy drain for HCP. Since drain placement her mentation has not fully resolved. Repeat head CT shows no worsening of HCP or blood products. Neurology was asked to further evaluate. At time of evaluation, patient intubated on the vent, Temp 100.8, WBC 12.2. CSF: WBC 168, red blood cells 70000, lymph 7, neutrophile 85, glucose 78, protein 139   SUBJECTIVE (INTERVAL HISTORY) Patient remains obtunded and eyes closed and not able to be aroused. Moaning on pain stimulation but not open eyes. Had long discussion with granddaughter and daughter, they would like PEG tube and NF placement.   OBJECTIVE Temp:  [98.4 F (36.9 C)-100.6 F (38.1 C)] 98.9 F (37.2 C) (10/26 2302) Pulse Rate:  [80-115] 102 (10/26 2200) Cardiac Rhythm:  [-] Atrial fibrillation (10/26 2300) Resp:  [17-47] 23 (10/26 2200) BP: (119-165)/(47-91) 156/71 mmHg (10/26 2200) SpO2:  [100 %] 100 % (10/26 2200) Weight:  [146 lb 2.6 oz (66.3 kg)] 146 lb 2.6 oz (66.3 kg) (10/26 0400)   Recent Labs Lab 09/15/14 0345 09/15/14 0747 09/15/14 1125 09/15/14 1550 09/15/14 1933  GLUCAP 141* 124* 129* 137* 134*    Recent Labs Lab 09/10/14 0500 09/11/14 0500 09/12/14 2300 09/14/14 0815  09/15/14 0440  NA 144 141 137 136* 139  K 3.8 4.0 4.4 3.8 4.0  CL 109 107 103 100 103  CO2 24 24 23 23 22   GLUCOSE 150* 121* 175* 137* 124*  BUN 35* 34* 34* 42* 45*  CREATININE 1.21* 1.21* 1.17* 1.25* 1.34*  CALCIUM 7.8* 7.7* 7.9* 7.6* 7.6*   No results found for this basename: AST, ALT, ALKPHOS, BILITOT, PROT, ALBUMIN,  in the last 168 hours  Recent Labs Lab 09/10/14 0500 09/11/14 0500 09/12/14 2300 09/14/14 0815 09/15/14 0440  WBC 10.2 10.2 13.2* 11.9* 11.2*  HGB 9.4* 9.3* 9.5* 8.5* 8.1*  HCT 28.7* 28.4* 29.7* 26.2* 24.9*  MCV 93.8 91.3 93.4 90.3 91.5  PLT 188 197 233 248 244   No results found for this basename: CKTOTAL, CKMB, CKMBINDEX, TROPONINI,  in the last 168 hours No results found for this basename: LABPROT, INR,  in the last 72 hours No results found for this basename: COLORURINE, APPERANCEUR, LABSPEC, PHURINE, GLUCOSEU, HGBUR, BILIRUBINUR, KETONESUR, PROTEINUR, UROBILINOGEN, NITRITE, LEUKOCYTESUR,  in the last 72 hours     Component Value Date/Time   CHOL 204* 09/02/2014 0530   TRIG 149 09/02/2014 0530   HDL 64 09/02/2014 0530   CHOLHDL 3.2 09/02/2014 0530   VLDL 30 09/02/2014 0530   LDLCALC 110* 09/02/2014 0530   Lab Results  Component Value Date   HGBA1C 5.6 09/01/2014   No results found for this basename: labopia,  cocainscrnur,  labbenz,  amphetmu,  thcu,  labbarb    No results found for this basename: ETH,  in  the last 168 hours  I have personally reviewed the radiological images below and agree with the radiology interpretations.  Ct Head Wo Contrast 09/14/14-  1. Stable appearance of hydrocephalus involving the lateral and third ventricles with some residual layering blood products.  2. Persisting gas within the frontal horn of the right lateral ventricle following removal of the ventriculostomy catheter.  3. Stable mild diffuse white matter disease without evidence for acute infarct or significant interval change.  4. Stable focal hemorrhage  within the left cerebral peduncle. 09/11/14 - Normal ventricular enlargement is stable. Intraventricular hemorrhage slightly improved. Small area of hemorrhage or calcification in the right parietal lobe is stable.  09/08/2014  1. Unchanged dilatation of the third ventricle and likely no significant interval change in dilatation of the lateral ventricles. 2. Unchanged intraventricular blood. 3. Small volume of subarachnoid blood.    09/05/2014   1. Interval placement of right frontal approach ventriculostomy catheter with tip in the third ventricle. Ventricles remain dilated but are slightly decreased in size relative to previous MRI from 09/03/2014. 2. Layering hemorrhage within the occipital horns of the lateral ventricles bilaterally, stable from prior. 3. Question trace acute subarachnoid hemorrhage within the right sylvian fissure.     08/31/2014  Mild, likely age-appropriate, centralized volume loss without acute intracranial process.     Mr Susan JeanBrain W Wo Contrast 09/12/14 Diffuse ventricular enlargement unchanged from CT scan yesterday. Findings suggest communicating hydrocephalus. Interval removal ventricular catheter with gas now in the right frontal horn. Intraventricular hemorrhage and subarachnoid hemorrhage appears  stable. Negative for acute ischemic infarct. 09/03/2014    Acute hydrocephalus, worsened since 3 days ago. Layering material in the occipital horns of lateral ventricles, at least partly hemorrhagic. Question focal thrombus in the atrium of the right lateral ventricle, possibly associated with the choroid. This could be the site of hemorrhage. No specific vascular lesion is identified. No intraparenchymal hemorrhage. The differential diagnosis does include meningitis.    Dg Chest Port 1 View 09/06/2014   Feeding tube with weighted tip in stomach.   09/04/2014    New left jugular central venous catheter tip overlies the distal SVC. No evidence of pneumothorax.  Stable cardiomegaly.   No active lung disease.     Dg Chest 2 View 08/31/2014    Borderline cardiomegaly and lung hyperexpansion without acute cardiopulmonary disease.    EEG 09/11/14 : This is a markedly abnormal electroencephalogram secondary to general background slowing with frequent intermittent discharges of triphasic morphology. Although this may be seen with a severe metabolic encephalopathy can not rule out the possibility that this may represent epileptic phenomenon. Clinical correlation recommended.    PHYSICAL EXAM Frail elderly Caucasian lady not in distress. Afebrile. Head is nontraumatic. Neck is supple without bruit.  Cardiac exam no murmur or gallop, but tachycardia. Lungs are clear to auscultation. Distal pulses are well felt.  Neurological Exam: obtunded, not open eyes on voice or pain stimulation, not following commands. Pupils exam showed left surgical pupil, left sluggish to light. Slight dysconjugate gaze with left eye hypotropia. Roving eye movements present. Does not blink to threat on either side. Fundi could not be visualized. Face is symmetric. Tongue is midline. BLE withdraw to pain but not against gravity, LUE flaccid 0/5 on pain stimulation. RUE extension on pain stimulation and rigidity with cog-wheeling. Left plantar upgoing right equivocal. Sensation and coordination cannot be reliably tested. Gait was not tested.  ASSESSMENT/PLAN Ms. Susan SoGiuseppina Leaman is a 80 y.o. female with history of altered mental  status following UTI, seen in the ED 08/31/2014 - admitted 09/01/2014 with confusion. Patient was on coumadin at that time for atrial fibrillation. INR 1.84. Administered Levaquin. BP elevated on the 11th at 166/44, max BP next day 189/112. Pt INR trending up and 2.10 on 09/03/14. In retrospect, initial CT with L IVH. Repeat CT done during workup found to have worsening of IVH with bilateral occipital intraventricular blood with hydrocephalus. Hypertension and coumadin associated coagulapathy  etiology of hemorrhage.   Stroke:  Non-dominant IVH secondary to coumadin associated coagulopathy in setting of uncontrolled hypertension.  Intraventricular drain placed with no improvement of mental status.  CSF without source of infection.   EVD removed  SCDs for VTE prophylaxis NPO on TF Discussed with family, they leaning towards PEG  Bedrest    warfarin prior to admission, no antithrombotics now.  Resultant confusion, unable to follow commands mental status disproportionate to amount of IVH and CSF pressure.    Therapy recommendations:  SNF  Disposition:  SNF  AMS - likely due to IVH and hydrocephalus - LTM EEG did not detect seizure activity  - d/c vimpat and dilantin - Dr. Cyril Mourning help appreciated  Atrial Fibrillation  Home meds:  Warfarin prior to admission  INR 1.51 on admission 10/12. High as 2.10 on 09/03/14.   No longer an anticoagulation candidate secondary to hemorrhage  Now in sinus but tachy  On metoprolol, add cardiazem  Also on amiodarone   Hypertension  Stable  BP goal < 160  On metoprolol  Leukocytosis  - WBC down trending 13.2->11.9 - culture sent so far negative - on vancon/zosyn - afebrile  Hyponatremia with uptrending BUN and Cre - d/c free water - NS @ 30cc - continue TF @ 45cc - daily lab  Hyperlipidemia  Home meds:  lipitor 80   LDL 110 goal < 70  D/c lipitor due to IVH  Other Stroke Risk Factors Advanced age   Obesity, Body mass index is 29.51 kg/(m^2).    Coronary artery disease - s/p CABG ICA stenosis s/p CEA 2015 PVD  Other Active Problems  UTI with pyelonephritis  CKD   Hospital day # 14  This patient is critically ill and at significant risk of neurological worsening, death and care requires constant monitoring of vital signs, hemodynamics,respiratory and cardiac monitoring,review of multiple databases, neurological assessment, discussion with family, other specialists and medical decision making  of high complexity.I have made any additions or clarifications directly to the above note. I spent 35 minutes of neurocritical care time  in the care of  this patient.  I had long discussion with granddaughter and daughter and updated them pt condition, diagnosis, and treatment as well as prognosis. They will let me know in am but leaning towards PEG and NF placement.   Marvel Plan, MD PhD Stroke Neurology 09/15/2014 11:44 PM     To contact Stroke Continuity provider, please refer to [email or web address]. After hours, contact General Neurology

## 2014-09-15 NOTE — Progress Notes (Signed)
ANTIBIOTIC CONSULT NOTE - FOLLOW UP  Pharmacy Consult for Vancomycin Indication: HCAP  Allergies  Allergen Reactions  . Septra [Sulfamethoxazole-Trimethoprim] Hives    Patient Measurements: Height: 4\' 11"  (149.9 cm) Weight: 146 lb 2.6 oz (66.3 kg) IBW/kg (Calculated) : 43.2 Adjusted Body Weight:   Vital Signs: Temp: 98.4 F (36.9 C) (10/26 0748) Temp Source: Axillary (10/26 0748) BP: 148/55 mmHg (10/26 1006) Pulse Rate: 101 (10/26 1000) Intake/Output from previous day: 10/25 0701 - 10/26 0700 In: 2282.5 [I.V.:695; NG/GT:1300; IV Piggyback:287.5] Out: 1235 [Urine:1235] Intake/Output from this shift: Total I/O In: 262.5 [I.V.:90; NG/GT:135; IV Piggyback:37.5] Out: 300 [Urine:300]  Labs:  Recent Labs  09/12/14 2300 09/14/14 0815 09/15/14 0440  WBC 13.2* 11.9* 11.2*  HGB 9.5* 8.5* 8.1*  PLT 233 248 244  CREATININE 1.17* 1.25* 1.34*   Estimated Creatinine Clearance: 25.9 ml/min (by C-G formula based on Cr of 1.34). No results found for this basename: VANCOTROUGH, Leodis Binet, VANCORANDOM, GENTTROUGH, GENTPEAK, GENTRANDOM, TOBRATROUGH, TOBRAPEAK, TOBRARND, AMIKACINPEAK, AMIKACINTROU, AMIKACIN,  in the last 72 hours   Microbiology: Recent Results (from the past 720 hour(s))  URINE CULTURE     Status: None   Collection Time    09/01/14  8:52 PM      Result Value Ref Range Status   Specimen Description URINE, RANDOM   Final   Special Requests NONE   Final   Culture  Setup Time     Final   Value: 09/02/2014 05:33     Performed at [facility] Count     Final   Value: NO GROWTH     Performed at [facility]   Culture     Final   Value: NO GROWTH     Performed at [facility]   Report Status 09/03/2014 FINAL   Final  CULTURE, BLOOD (ROUTINE X 2)     Status: None   Collection Time    09/01/14  9:18 PM      Result Value Ref Range Status   Specimen Description BLOOD RIGHT ARM   Final   Special Requests BOTTLES DRAWN AEROBIC AND  ANAEROBIC 4CC   Final   Culture  Setup Time     Final   Value: 09/02/2014 04:13     Performed at [facility]   Culture     Final   Value: NO GROWTH 5 DAYS     Performed at [facility]   Report Status 09/08/2014 FINAL   Final  CULTURE, BLOOD (ROUTINE X 2)     Status: None   Collection Time    09/01/14  9:45 PM      Result Value Ref Range Status   Specimen Description BLOOD RIGHT HAND   Final   Special Requests BOTTLES DRAWN AEROBIC ONLY 8CC   Final   Culture  Setup Time     Final   Value: 09/02/2014 04:12     Performed at [facility]   Culture     Final   Value: NO GROWTH 5 DAYS     Performed at [facility]   Report Status 09/08/2014 FINAL   Final  MRSA PCR SCREENING     Status: None   Collection Time    09/03/14  4:41 PM      Result Value Ref Range Status   MRSA by PCR NEGATIVE  NEGATIVE Final   Comment:            The GeneXpert MRSA Assay (FDA  approved for NASAL specimens     only), is one component of a     comprehensive MRSA colonization     surveillance program. It is not     intended to diagnose MRSA     infection nor to guide or     monitor treatment for     MRSA infections.  CSF CULTURE     Status: None   Collection Time    09/03/14  5:40 PM      Result Value Ref Range Status   Specimen Description CSF   Final   Special Requests NONE   Final   Gram Stain     Final   Value: RARE WBC PRESENT,BOTH PMN AND MONONUCLEAR     NO ORGANISMS SEEN     Performed at [facility]     Performed at [facility]   Culture     Final   Value: NO GROWTH 3 DAYS     Performed at [facility]   Report Status 09/07/2014 FINAL   Final  GRAM STAIN     Status: None   Collection Time    09/03/14  5:40 PM      Result Value Ref Range Status   Specimen Description CSF   Final   Special Requests NONE   Final   Gram Stain     Final   Value: RARE WBC PRESENT,BOTH PMN AND MONONUCLEAR     NO ORGANISMS SEEN   Report Status  09/04/2014 FINAL   Final  CLOSTRIDIUM DIFFICILE BY PCR     Status: None   Collection Time    09/06/14 12:00 PM      Result Value Ref Range Status   C difficile by pcr NEGATIVE  NEGATIVE Final  CSF CULTURE     Status: None   Collection Time    09/11/14 10:08 AM      Result Value Ref Range Status   Specimen Description CSF   Final   Special Requests NO 3 CUP   Final   Gram Stain     Final   Value: WBC PRESENT,BOTH PMN AND MONONUCLEAR     NO ORGANISMS SEEN     CYTOSPIN Performed at [facility]     Performed at [facility]   Culture     Final   Value: NO GROWTH 3 DAYS     Performed at [facility]   Report Status 09/14/2014 FINAL   Final  GRAM STAIN     Status: None   Collection Time    09/11/14 10:08 AM      Result Value Ref Range Status   Specimen Description CSF   Final   Special Requests NO 3 CUP   Final   Gram Stain     Final   Value: cytospin slide     WBC PRESENT,BOTH PMN AND MONONUCLEAR     NO ORGANISMS SEEN   Report Status 09/11/2014 FINAL   Final  URINE CULTURE     Status: None   Collection Time    09/12/14 10:59 PM      Result Value Ref Range Status   Specimen Description URINE, CATHETERIZED   Final   Special Requests NONE   Final   Culture  Setup Time     Final   Value: 09/13/2014 05:17     Performed at [facility]   Colony Count     Final   Value: >=100,000 COLONIES/ML  [facility]formed [facility]   Culture     Final   Value: YEAST     [facility]   Report Status 09/15/2014 FINAL   Final  CULTURE, BLOOD (ROUTINE X 2)     Status: None   Collection Time    09/12/14 11:50 PM      Result Value Ref Range Status   Specimen Description BLOOD RIGHT ARM   Final   Special Requests BOTTLES DRAWN AEROBIC ONLY 10CC   Final   Culture  Setup Time     Final   Value: 09/13/2014 03:47     [facility]formed [facility]   Culture     Final   Value:        BLOOD CULTURE RECEIVED NO GROWTH TO DATE  CULTURE WILL BE HELD FOR 5 DAYS BEFORE ISSUING A FINAL NEGATIVE REPORT     [facility]formed at [facility]   Report Status PENDING   Incomplete  CULTURE, BLOOD (ROUTINE X 2)     Status: None   Collection Time    09/12/14 11:59 PM      Result Value Ref Range Status   Specimen Description BLOOD RIGHT HAND   Final   Special Requests BOTTLES DRAWN AEROBIC ONLY 5CC   Final   Culture  Setup Time     Final   Value: 09/13/2014 03:47     Performed at [facility]   Culture     Final   Value:        BLOOD CULTURE RECEIVED NO GROWTH TO DATE CULTURE WILL BE HELD FOR 5 DAYS BEFORE ISSUING A FINAL NEGATIVE REPORT     [facility]formed at [facility]   Report Status PENDING   Incomplete    Anti-infectives   Start     Dose/Rate Route Frequency Ordered Stop   09/13/14 0600  piperacillin-tazobactam (ZOSYN) IVPB 3.375 g     3.375 g 12.5 mL/hr over 240 Minutes Intravenous 3 times per day 09/12/14 2248     09/12/14 2315  vancomycin (VANCOCIN) IVPB 750 mg/150 ml premix     750 mg 150 mL/hr over 60 Minutes Intravenous Every 24 hours 09/12/14 2310     09/12/14 2300  piperacillin-tazobactam (ZOSYN) IVPB 3.375 g     3.375 g 100 mL/hr over 30 Minutes Intravenous  Once 09/12/14 2254 09/13/14 0002   09/04/14 2200  acyclovir (ZOVIRAX) 500 mg in dextrose 5 % 100 mL IVPB  Status:  Discontinued     500 mg 110 mL/hr over 60 Minutes Intravenous Every 12 hours 09/04/14 1345 09/04/14 1543   09/04/14 1600  vancomycin (VANCOCIN) IVPB 750 mg/150 ml premix  Status:  Discontinued     750 mg 150 mL/hr over 60 Minutes Intravenous Every 24 hours 09/03/14 1518 09/03/14 1537   09/04/14 0200  cefTRIAXone (ROCEPHIN) 2 g in dextrose 5 % 50 mL IVPB  Status:  Discontinued     2 g 100 mL/hr over 30 Minutes Intravenous Every 12 hours 09/03/14 1431 09/03/14 1537   09/03/14 2200  acyclovir (ZOVIRAX) 430 mg in dextrose 5 % 100 mL IVPB  Status:  Discontinued     430 mg 108.6 mL/hr over 60 Minutes Intravenous Every 12 hours  09/03/14 1011 09/04/14 1345   09/03/14 1800  ampicillin (OMNIPEN) 1 g in sodium chloride 0.9 % 50 mL IVPB  Status:  Discontinued     1 g 150 mL/hr over 20 Minutes Intravenous 4 times per day 09/03/14  1519 09/03/14 1537   09/03/14 1600  vancomycin (VANCOCIN) 1,500 mg in sodium chloride 0.9 % 500 mL IVPB  Status:  Discontinued     1,500 mg 250 mL/hr over 120 Minutes Intravenous  Once 09/03/14 1517 09/03/14 1537   09/03/14 1515  cefTRIAXone (ROCEPHIN) 2 g in dextrose 5 % 50 mL IVPB  Status:  Discontinued     2 g 100 mL/hr over 30 Minutes Intravenous  Once 09/03/14 1430 09/03/14 1537   09/03/14 1100  acyclovir (ZOVIRAX) 430 mg in dextrose 5 % 100 mL IVPB     430 mg 108.6 mL/hr over 60 Minutes Intravenous  Once 09/03/14 1010 09/03/14 1342   09/01/14 2200  levofloxacin (LEVAQUIN) IVPB 750 mg  Status:  Discontinued     750 mg 100 mL/hr over 90 Minutes Intravenous Every 48 hours 09/01/14 2039 09/03/14 1422      Susan Calhoun except 10 mg Mon) s/p FFP/Vit K; INR 1.11 on 10/17.MD note mentions SCDs so I entered orders.  ID: initially UTI and acute hydrocephalus, r/o viral meningitis > off abx, fever overnight 10/23, HCAP vs meningitis? Started vanc/zosyn. Tmax 100.6. WBC 11.2 unchanged. Scr 1.34 slowly trending up. UOP 0.8.  10/23 blood - pending 10/23 urine - >100,000 yeast 10/23 CSF - Negative 10/12 blood cx >> Neg 10/12 urine cx>> Neg 10/14 CSF cx>> Neg 10/14 HSV>> neg 10/15 HIV>> neg  Vanc 10/23 >> Zosyn 10/23 Levofloxacin 10/12>>10/14 Acyclovir 10/14>>10/15  CV: HTN, A fib, PVD; BP variable, HR 57-122, currently 101 Meds: Amio, diltiaem, metoprolol  Endo: DM2; CBGs  ok on sens SSI  GI/Nutr: Vital TF. PEG?  Neuro: DM neuropathy; IVH, requiring Intraventricular drain > no MS improvement, unable to follow commands, MRI 10/24 IVH and SAH appears stable.   Nephro: CKD. Scr 1.34 slowly trending up. UOP 0.8.  Pulm: respiratory alkalosis; 100/RA  Hem/Onc: H/H low stable, and plts wnl  PTA Med Issues: continued travoprost; allopurinol, ASA 81, coreg, vitamin D, Vitamin B12, iron, Lasix, lisinopril, metformin, nifedipine, coumadin  Best Practices: SCDs   Goal of Therapy:  Vancomycin trough level 15-20 mcg/ml  Plan:  - Vancomycin 750mg  IV q24h - zosyn 3.375g Q8 per MD   Lexx Monte S. Merilynn Finlandobertson, PharmD, BCPS Clinical Staff Pharmacist Pager [phone]  Misty Stanleyobertson, Bearett Porcaro Stillinger 09/15/2014,10:55 AM

## 2014-09-16 DIAGNOSIS — I472 Ventricular tachycardia: Secondary | ICD-10-CM

## 2014-09-16 DIAGNOSIS — R4182 Altered mental status, unspecified: Secondary | ICD-10-CM

## 2014-09-16 DIAGNOSIS — R000 Tachycardia, unspecified: Secondary | ICD-10-CM

## 2014-09-16 DIAGNOSIS — I5031 Acute diastolic (congestive) heart failure: Secondary | ICD-10-CM

## 2014-09-16 LAB — POCT I-STAT 3, ART BLOOD GAS (G3+)
Acid-base deficit: 1 mmol/L (ref 0.0–2.0)
Bicarbonate: 22.7 mEq/L (ref 20.0–24.0)
O2 Saturation: 97 %
PH ART: 7.443 (ref 7.350–7.450)
TCO2: 24 mmol/L (ref 0–100)
pCO2 arterial: 33.2 mmHg — ABNORMAL LOW (ref 35.0–45.0)
pO2, Arterial: 86 mmHg (ref 80.0–100.0)

## 2014-09-16 LAB — BASIC METABOLIC PANEL
Anion gap: 12 (ref 5–15)
BUN: 39 mg/dL — AB (ref 6–23)
CO2: 23 mEq/L (ref 19–32)
Calcium: 7.9 mg/dL — ABNORMAL LOW (ref 8.4–10.5)
Chloride: 104 mEq/L (ref 96–112)
Creatinine, Ser: 1.15 mg/dL — ABNORMAL HIGH (ref 0.50–1.10)
GFR calc Af Amer: 49 mL/min — ABNORMAL LOW (ref >90)
GFR, EST NON AFRICAN AMERICAN: 42 mL/min — AB (ref >90)
GLUCOSE: 147 mg/dL — AB (ref 70–99)
Potassium: 3.6 mEq/L — ABNORMAL LOW (ref 3.7–5.3)
SODIUM: 139 meq/L (ref 137–147)

## 2014-09-16 LAB — GLUCOSE, CAPILLARY
GLUCOSE-CAPILLARY: 151 mg/dL — AB (ref 70–99)
GLUCOSE-CAPILLARY: 150 mg/dL — AB (ref 70–99)
Glucose-Capillary: 89 mg/dL (ref 70–99)
Glucose-Capillary: 157 mg/dL — ABNORMAL HIGH (ref 70–99)
Glucose-Capillary: 166 mg/dL — ABNORMAL HIGH (ref 70–99)

## 2014-09-16 LAB — CBC
HCT: 26.1 % — ABNORMAL LOW (ref 36.0–46.0)
HEMOGLOBIN: 8.4 g/dL — AB (ref 12.0–15.0)
MCH: 29.4 pg (ref 26.0–34.0)
MCHC: 32.2 g/dL (ref 30.0–36.0)
MCV: 91.3 fL (ref 78.0–100.0)
Platelets: 246 10*3/uL (ref 150–400)
RBC: 2.86 MIL/uL — ABNORMAL LOW (ref 3.87–5.11)
RDW: 17.3 % — ABNORMAL HIGH (ref 11.5–15.5)
WBC: 11 10*3/uL — ABNORMAL HIGH (ref 4.0–10.5)

## 2014-09-16 LAB — MAGNESIUM: MAGNESIUM: 2 mg/dL (ref 1.5–2.5)

## 2014-09-16 LAB — TROPONIN I: Troponin I: 0.3 ng/mL (ref <0.30)

## 2014-09-16 LAB — PRO B NATRIURETIC PEPTIDE: Pro B Natriuretic peptide (BNP): 9594 pg/mL — ABNORMAL HIGH (ref 0–450)

## 2014-09-16 LAB — PHOSPHORUS: Phosphorus: 3.6 mg/dL (ref 2.3–4.6)

## 2014-09-16 MED ORDER — AMANTADINE HCL 100 MG PO CAPS
200.0000 mg | ORAL_CAPSULE | Freq: Two times a day (BID) | ORAL | Status: DC
  Administered 2014-09-16 – 2014-09-25 (×19): 200 mg via ORAL
  Filled 2014-09-16 (×21): qty 2

## 2014-09-16 MED ORDER — FUROSEMIDE 10 MG/ML IJ SOLN: 40.0000 mg | Freq: Every day | INTRAMUSCULAR | Status: DC

## 2014-09-16 MED ORDER — METOPROLOL TARTRATE 25 MG/10 ML ORAL SUSPENSION
50.0000 mg | Freq: Two times a day (BID) | ORAL | Status: DC
  Administered 2014-09-16 – 2014-09-18 (×6): 50 mg
  Filled 2014-09-16 (×9): qty 20

## 2014-09-16 MED ORDER — INSULIN ASPART 100 UNIT/ML ~~LOC~~ SOLN: 0.0000 [IU] | Freq: Four times a day (QID) | SUBCUTANEOUS | Status: DC

## 2014-09-16 MED ORDER — POTASSIUM CHLORIDE 20 MEQ/15ML (10%) PO LIQD
40.0000 meq | Freq: Every day | ORAL | Status: DC
  Administered 2014-09-16 – 2014-09-21 (×6): 40 meq via ORAL
  Filled 2014-09-16 (×7): qty 30

## 2014-09-16 MED ORDER — INSULIN ASPART 100 UNIT/ML ~~LOC~~ SOLN
0.0000 [IU] | Freq: Four times a day (QID) | SUBCUTANEOUS | Status: DC
  Administered 2014-09-16 (×2): 3 [IU] via SUBCUTANEOUS
  Administered 2014-09-17 (×2): 2 [IU] via SUBCUTANEOUS
  Administered 2014-09-17 – 2014-09-19 (×7): 3 [IU] via SUBCUTANEOUS
  Administered 2014-09-19 – 2014-09-20 (×2): 2 [IU] via SUBCUTANEOUS
  Administered 2014-09-20: 3 [IU] via SUBCUTANEOUS
  Administered 2014-09-20: 2 [IU] via SUBCUTANEOUS
  Administered 2014-09-20: 3 [IU] via SUBCUTANEOUS
  Administered 2014-09-21: 5 [IU] via SUBCUTANEOUS
  Administered 2014-09-21: 3 [IU] via SUBCUTANEOUS
  Administered 2014-09-21: 2 [IU] via SUBCUTANEOUS
  Administered 2014-09-22 – 2014-09-23 (×5): 3 [IU] via SUBCUTANEOUS
  Administered 2014-09-23: 5 [IU] via SUBCUTANEOUS
  Administered 2014-09-25: 2 [IU] via SUBCUTANEOUS
  Administered 2014-09-25: 3 [IU] via SUBCUTANEOUS
  Administered 2014-09-26: 5 [IU] via SUBCUTANEOUS
  Administered 2014-09-26 – 2014-09-27 (×3): 2 [IU] via SUBCUTANEOUS
  Administered 2014-09-28 – 2014-09-29 (×2): 3 [IU] via SUBCUTANEOUS
  Administered 2014-09-29: 2 [IU] via SUBCUTANEOUS
  Administered 2014-09-30: 3 [IU] via SUBCUTANEOUS
  Administered 2014-09-30: 2 [IU] via SUBCUTANEOUS

## 2014-09-16 MED ORDER — POTASSIUM CHLORIDE 20 MEQ/15ML (10%) PO LIQD
ORAL | Status: AC
  Filled 2014-09-16: qty 30

## 2014-09-16 MED ORDER — POTASSIUM CHLORIDE 20 MEQ/15ML (10%) PO LIQD
40.0000 meq | ORAL | Status: AC
  Administered 2014-09-16 (×2): 40 meq
  Filled 2014-09-16: qty 30

## 2014-09-16 MED ORDER — FUROSEMIDE 10 MG/ML IJ SOLN
40.0000 mg | Freq: Every day | INTRAMUSCULAR | Status: DC
  Filled 2014-09-16: qty 4

## 2014-09-16 MED ORDER — FUROSEMIDE 10 MG/ML IJ SOLN
40.0000 mg | Freq: Every day | INTRAMUSCULAR | Status: DC
  Administered 2014-09-16 – 2014-09-27 (×12): 40 mg via INTRAVENOUS
  Filled 2014-09-16 (×12): qty 4

## 2014-09-16 MED ORDER — PANTOPRAZOLE SODIUM 40 MG PO PACK
40.0000 mg | PACK | Freq: Every day | ORAL | Status: DC
  Administered 2014-09-16 – 2014-09-30 (×15): 40 mg
  Filled 2014-09-16 (×18): qty 20

## 2014-09-16 MED ORDER — POTASSIUM CHLORIDE CRYS ER 20 MEQ PO TBCR: 40.0000 meq | EXTENDED_RELEASE_TABLET | ORAL | Status: DC

## 2014-09-16 NOTE — Progress Notes (Addendum)
STROKE TEAM PROGRESS NOTE   HISTORY Susan Calhoun is an 80 y.o. female who was noted by family to be acting confused > 2 weeks prior. Patient was brought to the hospital where she was diagnosed with a UTI. She was given Rocephin and sent home on Keflex. Due to not being awake enough to take her medications she was brought back to the hospital. MRI brain showed "layering material in the occipital horns of lateral ventricles, at least partly hemorrhagic. Question focal thrombus in the atrium of the right lateral ventricle, possibly associated with the choroid. This could be the site of hemorrhage". Infectious disease was consulted and they do not feel this is bacterial meningoencephalitis and ABX were D/C'd. Neuro surgery placed a Ventriculostomy drain for HCP. Since drain placement her mentation has not fully resolved. Repeat head CT shows no worsening of HCP or blood products. Neurology was asked to further evaluate. At time of evaluation, patient intubated on the vent, Temp 100.8, WBC 12.2. CSF: WBC 168, red blood cells 70000, lymph 7, neutrophile 85, glucose 78, protein 139   SUBJECTIVE (INTERVAL HISTORY) Patient remains obtunded and eyes closed and not able to be aroused. Moaning on pain stimulation but not open eyes. Had long discussion with granddaughter and daughter yesterday, they would like PEG tube and NF placement, but would like to have it done on Thursday. In am during round, pt had non-sustained VT, given metoprolol IV and it resolved. Will increase metoprolol 25mg  po to 50mg  po bid.    OBJECTIVE Temp:  [98.5 F (36.9 C)-99.6 F (37.6 C)] 98.7 F (37.1 C) (10/27 1134) Pulse Rate:  [76-112] 85 (10/27 1300) Cardiac Rhythm:  [-] Atrial fibrillation (10/27 1200) Resp:  [17-32] 30 (10/27 1300) BP: (118-165)/(43-91) 138/79 mmHg (10/27 1300) SpO2:  [100 %] 100 % (10/27 1300)   Recent Labs Lab 09/15/14 1933 09/15/14 2301 09/16/14 0310 09/16/14 0735 09/16/14 1133  GLUCAP 134* 104*  151* 89 150*    Recent Labs Lab 09/11/14 0500 09/12/14 2300 09/14/14 0815 09/15/14 0440 09/16/14 0400 09/16/14 0850  NA 141 137 136* 139 139  --   K 4.0 4.4 3.8 4.0 3.6*  --   CL 107 103 100 103 104  --   CO2 24 23 23 22 23   --   GLUCOSE 121* 175* 137* 124* 147*  --   BUN 34* 34* 42* 45* 39*  --   CREATININE 1.21* 1.17* 1.25* 1.34* 1.15*  --   CALCIUM 7.7* 7.9* 7.6* 7.6* 7.9*  --   MG  --   --   --   --   --  2.0  PHOS  --   --   --   --   --  3.6   No results found for this basename: AST, ALT, ALKPHOS, BILITOT, PROT, ALBUMIN,  in the last 168 hours  Recent Labs Lab 09/11/14 0500 09/12/14 2300 09/14/14 0815 09/15/14 0440 09/16/14 0400  WBC 10.2 13.2* 11.9* 11.2* 11.0*  HGB 9.3* 9.5* 8.5* 8.1* 8.4*  HCT 28.4* 29.7* 26.2* 24.9* 26.1*  MCV 91.3 93.4 90.3 91.5 91.3  PLT 197 233 248 244 246    Recent Labs Lab 09/16/14 0850  TROPONINI 0.30*   No results found for this basename: LABPROT, INR,  in the last 72 hours No results found for this basename: COLORURINE, APPERANCEUR, LABSPEC, PHURINE, GLUCOSEU, HGBUR, BILIRUBINUR, KETONESUR, PROTEINUR, UROBILINOGEN, NITRITE, LEUKOCYTESUR,  in the last 72 hours     Component Value Date/Time   CHOL 204* 09/02/2014  0530   TRIG 149 09/02/2014 0530   HDL 64 09/02/2014 0530   CHOLHDL 3.2 09/02/2014 0530   VLDL 30 09/02/2014 0530   LDLCALC 110* 09/02/2014 0530   Lab Results  Component Value Date   HGBA1C 5.6 09/01/2014   No results found for this basename: labopia,  cocainscrnur,  labbenz,  amphetmu,  thcu,  labbarb    No results found for this basename: ETH,  in the last 168 hours  I have personally reviewed the radiological images below and agree with the radiology interpretations.  Ct Head Wo Contrast 09/14/14-  1. Stable appearance of hydrocephalus involving the lateral and third ventricles with some residual layering blood products.  2. Persisting gas within the frontal horn of the right lateral ventricle following  removal of the ventriculostomy catheter.  3. Stable mild diffuse white matter disease without evidence for acute infarct or significant interval change.  4. Stable focal hemorrhage within the left cerebral peduncle. 09/11/14 - Normal ventricular enlargement is stable. Intraventricular hemorrhage slightly improved. Small area of hemorrhage or calcification in the right parietal lobe is stable.  09/08/2014  1. Unchanged dilatation of the third ventricle and likely no significant interval change in dilatation of the lateral ventricles. 2. Unchanged intraventricular blood. 3. Small volume of subarachnoid blood.    09/05/2014   1. Interval placement of right frontal approach ventriculostomy catheter with tip in the third ventricle. Ventricles remain dilated but are slightly decreased in size relative to previous MRI from 09/03/2014. 2. Layering hemorrhage within the occipital horns of the lateral ventricles bilaterally, stable from prior. 3. Question trace acute subarachnoid hemorrhage within the right sylvian fissure.     08/31/2014  Mild, likely age-appropriate, centralized volume loss without acute intracranial process.     [name] Contrast 09/12/14 Diffuse ventricular enlargement unchanged from CT scan yesterday. Findings suggest communicating hydrocephalus. Interval removal ventricular catheter with gas now in the right frontal horn. Intraventricular hemorrhage and subarachnoid hemorrhage appears  stable. Negative for acute ischemic infarct. 09/03/2014    Acute hydrocephalus, worsened since 3 days ago. Layering material in the occipital horns of lateral ventricles, at least partly hemorrhagic. Question focal thrombus in the atrium of the right lateral ventricle, possibly associated with the choroid. This could be the site of hemorrhage. No specific vascular lesion is identified. No intraparenchymal hemorrhage. The differential diagnosis does include meningitis.    Dg Chest Port 1 View 09/06/2014    Feeding tube with weighted tip in stomach.   09/04/2014    New left jugular central venous catheter tip overlies the distal SVC. No evidence of pneumothorax.  Stable cardiomegaly.  No active lung disease.     Dg Chest 2 View 08/31/2014    Borderline cardiomegaly and lung hyperexpansion without acute cardiopulmonary disease.    EEG 09/11/14 : This is a markedly abnormal electroencephalogram secondary to general background slowing with frequent intermittent discharges of triphasic morphology. Although this may be seen with a severe metabolic encephalopathy can not rule out the possibility that this may represent epileptic phenomenon. Clinical correlation recommended.    PHYSICAL EXAM Frail elderly Caucasian lady not in distress. Afebrile. Head is nontraumatic. Neck is supple without bruit.  Cardiac exam no murmur or gallop, but tachycardia. Lungs are clear to auscultation. Distal pulses are well felt.  Neurological Exam: obtunded, not open eyes on voice or pain stimulation, not following commands. Pupils exam showed left surgical pupil, left sluggish to light. Slight dysconjugate gaze with left eye hypotropia. Roving eye  movements present. Does not blink to threat on either side. Fundi could not be visualized. Face is symmetric. Tongue is midline. BLE withdraw to pain but not against gravity, LUE flaccid 0/5 on pain stimulation. RUE extension on pain stimulation and rigidity with cog-wheeling. Left plantar upgoing right equivocal. Sensation and coordination cannot be reliably tested. Gait was not tested.  ASSESSMENT/PLAN Ms. Ellianah Cordy is a 80 y.o. female with history of altered mental status following UTI, seen in the ED 08/31/2014 - admitted 09/01/2014 with confusion. Patient was on coumadin at that time for atrial fibrillation. INR 1.84. Administered Levaquin. BP elevated on the 11th at 166/44, max BP next day 189/112. Pt INR trending up and 2.10 on 09/03/14. In retrospect, initial CT with L  IVH. Repeat CT done during workup found to have worsening of IVH with bilateral occipital intraventricular blood with hydrocephalus. Hypertension and coumadin associated coagulapathy etiology of hemorrhage.   Stroke:  Non-dominant IVH secondary to coumadin associated coagulopathy in setting of uncontrolled hypertension.  Intraventricular drain placed with no improvement of mental status.  CSF without source of infection.   EVD removed  SCDs for VTE prophylaxis NPO on TF Discussed with family, they leaning towards PEG on Thursday.  Bedrest    warfarin prior to admission, no antithrombotics now.  Resultant confusion, unable to follow commands mental status disproportionate to amount of IVH and CSF pressure.  Therapy recommendations: SNF  Disposition:  SNF  AMS - likely due to IVH and hydrocephalus - LTM EEG did not detect seizure activity  - d/c vimpat and dilantin - Dr. Cyril Mourning help appreciated - add amantadine for arousal.   Atrial Fibrillation  Home meds:  Warfarin prior to admission  INR 1.51 on admission 10/12. High as 2.10 on 09/03/14.   No longer an anticoagulation candidate secondary to hemorrhage  Now in sinus but tachy  On metoprolol, add cardiazem  Also on amiodarone  Non-sustained VT - was given metoprolol iv  - resolved shortly - increase metoprolol to 50mg  bid - appreciate CCM assistance - trending CEs   Hypertension  Stable, on the higher side  BP goal < 160  On metoprolol, increase from 25mg  bid to 50mg  bid  Leukocytosis  - WBC down trending 13.2->11.9 ->11.0 - culture sent so far negative - on vancon/zosyn - afebrile  Hyponatremia with uptrending BUN and Cre - d/c free water - NS @ 30cc - continue TF @ 45cc - daily lab - Na trending up and Cre trending down  Hyperlipidemia  Home meds:  lipitor 80   LDL 110 goal < 70  D/c lipitor due to IVH  Other Stroke Risk Factors Advanced age   Obesity, Body mass index is 29.51  kg/(m^2).    Coronary artery disease - s/p CABG ICA stenosis s/p CEA 2015 PVD  Other Active Problems  UTI with pyelonephritis  CKD   Hospital day # 15  This patient is critically ill and at significant risk of neurological worsening, death and care requires constant monitoring of vital signs, hemodynamics,respiratory and cardiac monitoring,review of multiple databases, neurological assessment, discussion with family, other specialists and medical decision making of high complexity.I have made any additions or clarifications directly to the above note. I spent 35 minutes of neurocritical care time  in the care of  this patient.  I had long discussion with granddaughter and daughter and updated them pt condition, diagnosis, and treatment as well as prognosis. They will let me know in am but leaning towards PEG and NF  placement.   Marvel PlanJindong Keiandre Cygan, MD PhD Stroke Neurology 09/16/2014 1:05 PM     To contact Stroke Continuity provider, please refer to [email or web address]. After hours, contact General Neurology

## 2014-09-16 NOTE — Progress Notes (Signed)
CRITICAL VALUE ALERT  Critical value received:  Troponin 0.30  Date of notification:  09/16/2014  Time of notification:  0940  Critical value read back:Yes.    Nurse who received alert:  Marlana Latusasey Selah Klang RN  MD notified (1st page):  Dr Tyson AliasFeinstein   Time of first page:  [phone]   MD notified (2nd page):  Time of second page:  Responding MD:  Tyson AliasFeinstein  Time MD responded:  [phone]

## 2014-09-16 NOTE — Progress Notes (Signed)
OT Cancellation Note and Discharge  Patient Details Name: Susan Calhoun MRN: [id number] DOB: 09/29/1928   Cancelled Treatment:    Reason Eval/Treat Not Completed: Patient not medically ready. Noted from PT note yesterday, pt not participatory and noted today pt in VT and troponin is up. Spoke to nursing [name] and she says pt is not responding to anything right now and due to the 2 new medical issues therapy should just sign off. We will sign off, but please re-consult as appropriate.  [name], [name] [phone] 09/16/2014, 11:18 AM

## 2014-09-16 NOTE — Progress Notes (Addendum)
Patient heart rhythm is AFIB (Chronic), but patient does periods where rhythm appears to be a sinus rhythm.  Will monitor.  VSS and neuro status unchanged, will monitor

## 2014-09-16 NOTE — Consult Note (Signed)
Reason for Consult: NSVT Referring Physician:    [name] is an 80 y.o. female.  HPI:   The patient is an 80 yo female with a history of CAD, CABG within the last 10 years,  A-fib, Ablation- June 2015, DM, HTN, Anemia, glaucoma, PVD sp carotid endarterectomy.  She was on ASA and coumadin for stroke prophylaxis.  She presented on 09/01/14 with altered mental status.  MRI revealed acute hydrocephalus with concerns for meningitis.  Partially hemorrhagic.  She was started on rocephin, vancomycin and ampicillin which were later stopped.  Acyclovir for VZV.  ASA and coumadin were stopped.  She is SP right frontal IVC placement.  We are asked to see for VT and positive troponin.  She has been on 251m of amiodarone since 09/05/14 but not actually loaded on it.  Lopressor has been increased to 55mBID.    Past Medical History  Diagnosis Date  . Diabetes mellitus with diabetic polyneuropathy   . A-fib   . Renal disorder   . Hypertension   . Anemia   . Glaucoma     right eye   . Carotid artery stenosis   . PVD (peripheral vascular disease)     Past Surgical History  Procedure Laterality Date  . Replacement total knee Right   . Carotid endarterectomy  2015  . Coronary artery bypass graft    . Ablation      cardiac for arrthymia     History reviewed. No pertinent family history.  Social History:  reports that she has never smoked. She does not have any smokeless tobacco history on file. She reports that she does not drink alcohol or use illicit drugs.  Allergies:  Allergies  Allergen Reactions  . Septra [Sulfamethoxazole-Trimethoprim] Hives    Medications:  Scheduled Meds: . amantadine  200 mg Oral BID  . amiodarone  200 mg Per Tube Daily  . antiseptic oral rinse  7 mL Mouth Rinse q12n4p  . chlorhexidine  15 mL Mouth Rinse BID  . diltiazem  30 mg Oral Q8H  . insulin aspart  0-15 Units Subcutaneous Q6H  . latanoprost  1 drop Right Eye QHS  . metoprolol tartrate  50  mg Per Tube BID  . pantoprazole sodium  40 mg Per Tube Daily  . piperacillin-tazobactam (ZOSYN)  IV  3.375 g Intravenous 3 times per day  . vancomycin  1,000 mg Intravenous Q24H   Continuous Infusions: . sodium chloride 10 mL/hr at 09/16/14 0200  . sodium chloride 30 mL/hr at 09/16/14 0700  . feeding supplement (VITAL AF 1.2 CAL) 1,000 mL (09/16/14 0700)   PRN Meds:.acetaminophen, hydrALAZINE, loperamide, metoprolol, ondansetron   Results for orders placed during the hospital encounter of 09/01/14 (from the past 48 hour(s))  GLUCOSE, CAPILLARY     Status: Abnormal   Collection Time    09/14/14  3:58 PM      Result Value Ref Range   Glucose-Capillary 144 (*) 70 - 99 mg/dL  GLUCOSE, CAPILLARY     Status: Abnormal   Collection Time    09/14/14  7:04 PM      Result Value Ref Range   Glucose-Capillary 138 (*) 70 - 99 mg/dL  GLUCOSE, CAPILLARY     Status: Abnormal   Collection Time    09/14/14 11:16 PM      Result Value Ref Range   Glucose-Capillary 137 (*) 70 - 99 mg/dL  GLUCOSE, CAPILLARY     Status: Abnormal   Collection Time  09/15/14  3:45 AM      Result Value Ref Range   Glucose-Capillary 141 (*) 70 - 99 mg/dL  CBC     Status: Abnormal   Collection Time    09/15/14  4:40 AM      Result Value Ref Range   WBC 11.2 (*) 4.0 - 10.5 K/uL   RBC 2.72 (*) 3.87 - 5.11 MIL/uL   Hemoglobin 8.1 (*) 12.0 - 15.0 g/dL   HCT 24.9 (*) 36.0 - 46.0 %   MCV 91.5  78.0 - 100.0 fL   MCH 29.8  26.0 - 34.0 pg   MCHC 32.5  30.0 - 36.0 g/dL   RDW 17.5 (*) 11.5 - 15.5 %   Platelets 244  150 - 400 K/uL  BASIC METABOLIC PANEL     Status: Abnormal   Collection Time    09/15/14  4:40 AM      Result Value Ref Range   Sodium 139  137 - 147 mEq/L   Potassium 4.0  3.7 - 5.3 mEq/L   Chloride 103  96 - 112 mEq/L   CO2 22  19 - 32 mEq/L   Glucose, Bld 124 (*) 70 - 99 mg/dL   BUN 45 (*) 6 - 23 mg/dL   Creatinine, Ser 1.34 (*) 0.50 - 1.10 mg/dL   Calcium 7.6 (*) 8.4 - 10.5 mg/dL   GFR calc non Af  Amer 35 (*) >90 mL/min   GFR calc Af Amer 41 (*) >90 mL/min   Comment: (NOTE)     The eGFR has been calculated using the CKD EPI equation.     This calculation has not been validated in all clinical situations.     eGFR's persistently <90 mL/min signify possible Chronic Kidney     Disease.   Anion gap 14  5 - 15  GLUCOSE, CAPILLARY     Status: Abnormal   Collection Time    09/15/14  7:47 AM      Result Value Ref Range   Glucose-Capillary 124 (*) 70 - 99 mg/dL   Comment 1 Notify RN     Comment 2 Documented in Chart    GLUCOSE, CAPILLARY     Status: Abnormal   Collection Time    09/15/14 11:25 AM      Result Value Ref Range   Glucose-Capillary 129 (*) 70 - 99 mg/dL   Comment 1 Notify RN     Comment 2 Documented in Chart    GLUCOSE, CAPILLARY     Status: Abnormal   Collection Time    09/15/14  3:50 PM      Result Value Ref Range   Glucose-Capillary 137 (*) 70 - 99 mg/dL   Comment 1 Notify RN     Comment 2 Documented in Chart    GLUCOSE, CAPILLARY     Status: Abnormal   Collection Time    09/15/14  7:33 PM      Result Value Ref Range   Glucose-Capillary 134 (*) 70 - 99 mg/dL  VANCOMYCIN, TROUGH     Status: None   Collection Time    09/15/14 10:40 PM      Result Value Ref Range   Vancomycin Tr 12.8  10.0 - 20.0 ug/mL  GLUCOSE, CAPILLARY     Status: Abnormal   Collection Time    09/15/14 11:01 PM      Result Value Ref Range   Glucose-Capillary 104 (*) 70 - 99 mg/dL   Comment 1 Documented in Chart  Comment 2 Notify RN    GLUCOSE, CAPILLARY     Status: Abnormal   Collection Time    09/16/14  3:10 AM      Result Value Ref Range   Glucose-Capillary 151 (*) 70 - 99 mg/dL   Comment 1 Documented in Chart     Comment 2 Notify RN    CBC     Status: Abnormal   Collection Time    09/16/14  4:00 AM      Result Value Ref Range   WBC 11.0 (*) 4.0 - 10.5 K/uL   RBC 2.86 (*) 3.87 - 5.11 MIL/uL   Hemoglobin 8.4 (*) 12.0 - 15.0 g/dL   HCT 26.1 (*) 36.0 - 46.0 %   MCV 91.3   78.0 - 100.0 fL   MCH 29.4  26.0 - 34.0 pg   MCHC 32.2  30.0 - 36.0 g/dL   RDW 17.3 (*) 11.5 - 15.5 %   Platelets 246  150 - 400 K/uL  BASIC METABOLIC PANEL     Status: Abnormal   Collection Time    09/16/14  4:00 AM      Result Value Ref Range   Sodium 139  137 - 147 mEq/L   Potassium 3.6 (*) 3.7 - 5.3 mEq/L   Chloride 104  96 - 112 mEq/L   CO2 23  19 - 32 mEq/L   Glucose, Bld 147 (*) 70 - 99 mg/dL   BUN 39 (*) 6 - 23 mg/dL   Creatinine, Ser 1.15 (*) 0.50 - 1.10 mg/dL   Calcium 7.9 (*) 8.4 - 10.5 mg/dL   GFR calc non Af Amer 42 (*) >90 mL/min   GFR calc Af Amer 49 (*) >90 mL/min   Comment: (NOTE)     The eGFR has been calculated using the CKD EPI equation.     This calculation has not been validated in all clinical situations.     eGFR's persistently <90 mL/min signify possible Chronic Kidney     Disease.   Anion gap 12  5 - 15  GLUCOSE, CAPILLARY     Status: None   Collection Time    09/16/14  7:35 AM      Result Value Ref Range   Glucose-Capillary 89  70 - 99 mg/dL   Comment 1 Documented in Chart     Comment 2 Notify RN    MAGNESIUM     Status: None   Collection Time    09/16/14  8:50 AM      Result Value Ref Range   Magnesium 2.0  1.5 - 2.5 mg/dL  PHOSPHORUS     Status: None   Collection Time    09/16/14  8:50 AM      Result Value Ref Range   Phosphorus 3.6  2.3 - 4.6 mg/dL  TROPONIN I     Status: Abnormal   Collection Time    09/16/14  8:50 AM      Result Value Ref Range   Troponin I 0.30 (*) <0.30 ng/mL   Comment:            Due to the release kinetics of cTnI,     a negative result within the first hours     of the onset of symptoms does not rule out     myocardial infarction with certainty.     If myocardial infarction is still suspected,     repeat the test at appropriate intervals.     CRITICAL RESULT CALLED  TO, READ BACK BY AND VERIFIED WITH:     CASEY BRUNO,RN AT 0932 09/16/14 BY ZBEECH.  POCT I-STAT 3, ART BLOOD GAS (G3+)     Status: Abnormal    Collection Time    09/16/14  9:06 AM      Result Value Ref Range   pH, Arterial 7.443  7.350 - 7.450   pCO2 arterial 33.2 (*) 35.0 - 45.0 mmHg   pO2, Arterial 86.0  80.0 - 100.0 mmHg   Bicarbonate 22.7  20.0 - 24.0 mEq/L   TCO2 24  0 - 100 mmol/L   O2 Saturation 97.0     Acid-base deficit 1.0  0.0 - 2.0 mmol/L   Patient temperature 98.9 F     Collection site RADIAL, ALLEN'S TEST ACCEPTABLE     Drawn by RT     Sample type ARTERIAL    GLUCOSE, CAPILLARY     Status: Abnormal   Collection Time    09/16/14 11:33 AM      Result Value Ref Range   Glucose-Capillary 150 (*) 70 - 99 mg/dL   Comment 1 Notify RN     Comment 2 Documented in Chart    TROPONIN I     Status: None   Collection Time    09/16/14  2:30 PM      Result Value Ref Range   Troponin I <0.30  <0.30 ng/mL   Comment:            Due to the release kinetics of cTnI,     a negative result within the first hours     of the onset of symptoms does not rule out     myocardial infarction with certainty.     If myocardial infarction is still suspected,     repeat the test at appropriate intervals.    No results found.  Review of Systems  Unable to perform ROS: medical condition   Blood pressure 167/48, pulse 44, temperature 98.7 F (37.1 C), temperature source Axillary, resp. rate 31, height 4' 11"  (1.499 m), weight 146 lb 2.6 oz (66.3 kg), SpO2 100.00%. Physical Exam  Nursing note and vitals reviewed. Constitutional: She appears well-developed. No distress.  Obese  HENT:  Head: Normocephalic.  Cardiovascular: Normal rate, S1 normal and S2 normal.  An irregularly irregular rhythm present.  No murmur heard. Pulses:      Radial pulses are 2+ on the right side, and 2+ on the left side.       Posterior tibial pulses are 2+ on the right side, and 2+ on the left side.  Respiratory: Effort normal and breath sounds normal. No respiratory distress. She has no wheezes. She has no rales.  GI: Soft. Bowel sounds are normal. She  exhibits no distension.  Musculoskeletal: She exhibits edema (1+ left upper ext edema ).  Neurological:  Not responding to verbal commands.  Obtunded  Skin: Skin is warm and dry.    Assessment/Plan: Principal Problem:   Acute encephalopathy Active Problems:   Diabetes mellitus with diabetic polyneuropathy   A-fib   Hypertension   UTI (urinary tract infection)   Intracerebral hemorrhage   Wide complex VT   Anemia   Hypokalemia  Plan:  Increased lopressor appears to be helping.  Now 81m BID.  She is on 2075mof Amiodarone daily.  If she has any more I would load with 15062mV and 36m56m for 6 hrs then 30mg71mfor 18.  Check 2D echocardiogram and reassess.  CXR two days  ago showed vascular congestion and small pleural effusions.  Net fluids: +14.2L.  No lasix in the last ten days.  Start 24m IV lasix daily.  Give additional K+ as well.  BNP today  Aeneas Longsworth, BMillvale PRocky Ripple10/27/2015, 3:43 PM

## 2014-09-16 NOTE — Progress Notes (Signed)
Pt heart rhythm was going in and out of VT. DR Roda ShuttersXU in room at the time that this was happening. Dr Tyson AliasFeinstein also aware and agreed that is was becoming sustaining VT. Ordered several labs and 12 lead EKG and gave 5mg  lopresser IV.  Will continue to monitor.

## 2014-09-16 NOTE — Progress Notes (Signed)
NUTRITION FOLLOW-UP  INTERVENTION:  Continue Vital AF 1.2 @ 45 ml/hr  Provides 1296 kcal (99% of her minimum needs), 81 gm protein, 876 ml free water daily.  Recommend 105 ml free water every 6 hours  NUTRITION DIAGNOSIS: Inadequate oral intake related to inability to eat as evidenced by NPO status; ongoing.   Goal: Intake to meet >90% of estimated nutrition needs, met.  Monitor:  TF tolerance/adequacy, weight trend, labs  ASSESSMENT: Admitted to [facility] on 09/01/14 with working dx of UTI and acute encephalopathy. CT head was negative on admit. Had no improvement so MRI obtained on 10/14 showing: worsening hydrocephalus and layering material in the occipital horns of the lateral ventricles w/ question of hemorrhage raising concern for meningitis. Transferred to [facility] for further evaluation.   Vital AF 1.2 @ 45 ml/hr providing: 1296 kcal and 81 grams protein which is meeting needs at this time. Feeding tube with tip in stomach at pylorus per xray.  PEG planned for 10/29  BUN/Cr elevated CBG's: 89-151 Potassium low  Height: Ht Readings from Last 1 Encounters:  09/01/14 4' 11"  (1.499 m)    Weight: Wt Readings from Last 1 Encounters:  09/15/14 146 lb 2.6 oz (66.3 kg)  Weight trends: 121-142 lb Pt positive 14 L since admission  BMI:  Body mass index is 29.51 kg/(m^2).  Estimated Nutritional Needs: Kcal: 1300-1500 Protein: 65-80 gm Fluid: 1.5 L  Skin: ecchymosis left hip, rash right buttocks  Diet Order:     Intake/Output Summary (Last 24 hours) at 09/16/14 1354 Last data filed at 09/16/14 1300  Gross per 24 hour  Intake 2202.5 ml  Output   1650 ml  Net  552.5 ml    Last BM: 10/27 - diarrhea (c.diff ruled out) Rectal pouch placed 10/18 10/26: 250 ml  Labs:   Recent Labs Lab 09/14/14 0815 09/15/14 0440 09/16/14 0400 09/16/14 0850  NA 136* 139 139  --   K 3.8 4.0 3.6*  --   CL 100 103 104  --   CO2 23 22 23   --   BUN 42* 45* 39*  --   CREATININE 1.25*  1.34* 1.15*  --   CALCIUM 7.6* 7.6* 7.9*  --   MG  --   --   --  2.0  PHOS  --   --   --  3.6  GLUCOSE 137* 124* 147*  --     CBG (last 3)   Recent Labs  09/16/14 0310 09/16/14 0735 09/16/14 1133  GLUCAP 151* 89 150*    Scheduled Meds: . amantadine  200 mg Oral BID  . amiodarone  200 mg Per Tube Daily  . antiseptic oral rinse  7 mL Mouth Rinse q12n4p  . chlorhexidine  15 mL Mouth Rinse BID  . diltiazem  30 mg Oral Q8H  . insulin aspart  0-15 Units Subcutaneous Q6H  . latanoprost  1 drop Right Eye QHS  . metoprolol tartrate  50 mg Per Tube BID  . pantoprazole sodium  40 mg Per Tube Daily  . piperacillin-tazobactam (ZOSYN)  IV  3.375 g Intravenous 3 times per day  . vancomycin  1,000 mg Intravenous Q24H    Continuous Infusions: . sodium chloride 10 mL/hr at 09/16/14 0200  . sodium chloride 30 mL/hr at 09/16/14 0700  . feeding supplement (VITAL AF 1.2 CAL) 1,000 mL (09/16/14 0700)   [place], [place], [place] Pager [phone] After Hours Pager

## 2014-09-16 NOTE — Consult Note (Addendum)
  This has been a complicated hospital stay for this 80 year old female who presented with lethargy/encephalopathy and was found to have hydrocephalus. She has a history of chronic atrial fibrillation and recently underwent an ablation within the past 6-9 months. She redeveloped atrial fibrillation during this acute illness and was placed on amiodarone 200 mg per day. Today the caring physicians and nurses began noticing a wider complex rhythm at times that was somewhat faster than her baseline atrial fibrillation. No hemodynamic consequences or clinical deterioration was noted. We will consult him for the possibility of ventricular tachycardia.  I have reviewed the patient's rhythm strips and believe that the wide complex tachycardia represents atrial fibrillation with aberrancy/rate related bundle branch block. We should continue her current amiodarone dose. We will increase the beta blocker dose to slow her heart rate. We will repeat an echocardiogram to get an up-to-date left ventricular function assessment. Cardiac markers will be done to rule out acute MI. A BNP level will be obtained and we will give additional Lasix to diurese the patient out for we think is acute diastolic heart failure.

## 2014-09-16 NOTE — Progress Notes (Signed)
PULMONARY / CRITICAL CARE MEDICINE - consult   Name: Susan Calhoun MRN: [id number] DOB: 07/18/1928    ADMISSION DATE:  09/01/2014 CONSULTATION DATE:  10/14   REFERRING MD :  Elisabeth Pigeonevine   CHIEF COMPLAINT:  Acute encephalopathy and VT  INITIAL PRESENTATION:   80 y.o. female with PMH of dm, AF (on rate cntl and coumadin). Admitted 09/01/14 with working dx of UTI and acute encephalopathy. CT head Patient was started on empiric Levaquin for possible urinary tract infection. CT head was negative on admit. Had no improvement so MRI obtained on 10/14 showing:  worsening hydrocephalus and layering material in the occipital horns of the lateral ventricles w/ question of hemorrhage raising concern for meningitis. [facility] evalation.   STUDIES:  CT head 10/14: Mild, likely age-appropriate, centralized volume loss without acute intracranial process MR brain 10/14: Acute hydrocephalus, worsened since 3 days ago. Layering material in the occipital horns of lateral ventricles, at least partly hemorrhagic. Question focal thrombus in the atrium of the right lateral ventricle, possibly associated with the choroid. This could  be the site of hemorrhage. No specific vascular lesion is identified. No intraparenchymal hemorrhage. The differential diagnosis does include meningitis.  SIGNIFICANT EVENTS: 10/12: [facility]  10/14: No improvement. MR brain worrisome for worsening hydrocephalus and possibly meningitis. [facility]   10/14- coags corrected and drain IVC placed, high pressure noted 10/15 R frontal IVC placement [name](Stern) 10/21 ventric clamped 10/27 VT and positive troponins  SUBJECTIVE:   Unresponsive, no obvious pain.  VITAL SIGNS: Temp:  [98.5 F (36.9 C)-99.6 F (37.6 C)] 98.7 F (37.1 C) (10/27 1134) Pulse Rate:  [76-112] 85 (10/27 1300) Resp:  [17-32] 30 (10/27 1300) BP: (118-165)/(43-79) 138/79 mmHg (10/27 1300) SpO2:  [100 %] 100 %  (10/27 1300) HEMODYNAMICS:   VENTILATOR SETTINGS:   INTAKE / OUTPUT:  Intake/Output Summary (Last 24 hours) at 09/16/14 1409 Last data filed at 09/16/14 1300  Gross per 24 hour  Intake 2127.5 ml  Output   1450 ml  Net  677.5 ml   PHYSICAL EXAMINATION: General: RASS -1, + F/C Neuro: MAEs, monosyllables, not oriented, int folows commands HEENT: IVC drain in place Cardiovascular: IR, no M Lungs:  Clear Abdomen:  Soft, BS wnl, no r/g Ext:  No edema  LABS: I have reviewed all of today's lab results. Relevant abnormalities are discussed in the A/P section  CBC  Recent Labs Lab 09/14/14 0815 09/15/14 0440 09/16/14 0400  WBC 11.9* 11.2* 11.0*  HGB 8.5* 8.1* 8.4*  HCT 26.2* 24.9* 26.1*  PLT 248 244 246   Coag's No results found for this basename: APTT, INR,  in the last 168 hours BMET  Recent Labs Lab 09/14/14 0815 09/15/14 0440 09/16/14 0400  NA 136* 139 139  K 3.8 4.0 3.6*  CL 100 103 104  CO2 23 22 23   BUN 42* 45* 39*  CREATININE 1.25* 1.34* 1.15*  GLUCOSE 137* 124* 147*   Electrolytes  Recent Labs Lab 09/14/14 0815 09/15/14 0440 09/16/14 0400 09/16/14 0850  CALCIUM 7.6* 7.6* 7.9*  --   MG  --   --   --  2.0  PHOS  --   --   --  3.6   Sepsis Markers No results found for this basename: LATICACIDVEN, PROCALCITON, O2SATVEN,  in the last 168 hours ABG  Recent Labs Lab 09/13/14 0244 09/16/14 0906  PHART 7.439 7.443  PCO2ART 32.8* 33.2*  PO2ART 75.8* 86.0   Liver  Enzymes No results found for this basename: AST, ALT, ALKPHOS, BILITOT, ALBUMIN,  in the last 168 hours Cardiac Enzymes  Recent Labs Lab 09/16/14 0850  TROPONINI 0.30*   Glucose  Recent Labs Lab 09/15/14 1550 09/15/14 1933 09/15/14 2301 09/16/14 0310 09/16/14 0735 09/16/14 1133  GLUCAP 137* 134* 104* 151* 89 150*    Imaging No results found. ASSESSMENT / PLAN:  NEUROLOGIC A:   Hydrocephalus s/p ventricular drain -unclear cause P:   Neuro primary - clamped  ventric with plan to dc eventual  PULMONARY  A: Air way protection ok for now P:   Supp O2 Monitor in ICU  CARDIOVASCULAR A:  AF, CVR -INR 1.5 on admit VT episode today with positive troponins P:  Lopressor for rate controlled. Recommend calling cardiology given positive troponins  RENAL A:  Hypernatremia -improved Hypokalemia P:   Monitor BMET intermittently Monitor I/Os Correct electrolytes as indicated  GASTROINTESTINAL A:   Dysphagia P:   SUP: N/I Cont TFs Doubt she will pass swallow   HEMATOLOGIC A:   Coumadin induced coagulapthy- resolved P:  DVT px: SQ heparin Monitor CBC intermittently Transfuse per usual ICU guidelines  INFECTIOUS A:   UTI  No evidence meningitis Unlikely encephalitis  Minimal suspicion for bacterial component P:   BCx2 10/12>> NEG UC 10/12: neg  CSF with WBC 168, neutros 85, lymphs 7 ' CSF cx >> NEG  Abx: CTX, start date 10/14, d/c ABX vanc start date 10/14 d/c abx ampicillin date 10/14 d/c Acyclovir start date 10/14>>>10/15  ENDOCRINE A:   DM  2 P:   Cont SSI  Family Updated - 10/21 grand daughter  TODAY'S SUMMARY: VT episode today with positive troponins, stabilized with lopressors but unable to question if patient has chest pain.  Will order an EKG and recommend calling cardiology.  PCCM will sign off.  Alyson ReedyWesam G. Gurnie Duris, M.D. [facility] Pulmonary/Critical Care Medicine. Pager: [phone]. After hours pager: [phone].

## 2014-09-17 ENCOUNTER — Encounter (HOSPITAL_COMMUNITY): Payer: Self-pay | Admitting: General Surgery

## 2014-09-17 ENCOUNTER — Inpatient Hospital Stay (HOSPITAL_COMMUNITY): Payer: Medicare (Managed Care)

## 2014-09-17 DIAGNOSIS — I059 Rheumatic mitral valve disease, unspecified: Secondary | ICD-10-CM

## 2014-09-17 DIAGNOSIS — I481 Persistent atrial fibrillation: Secondary | ICD-10-CM

## 2014-09-17 LAB — BASIC METABOLIC PANEL
ANION GAP: 12 (ref 5–15)
BUN: 35 mg/dL — ABNORMAL HIGH (ref 6–23)
CALCIUM: 7.9 mg/dL — AB (ref 8.4–10.5)
CO2: 23 mEq/L (ref 19–32)
CREATININE: 1.2 mg/dL — AB (ref 0.50–1.10)
Chloride: 106 mEq/L (ref 96–112)
GFR calc Af Amer: 47 mL/min — ABNORMAL LOW (ref >90)
GFR calc non Af Amer: 40 mL/min — ABNORMAL LOW (ref >90)
Glucose, Bld: 145 mg/dL — ABNORMAL HIGH (ref 70–99)
Potassium: 4.4 mEq/L (ref 3.7–5.3)
Sodium: 141 mEq/L (ref 137–147)

## 2014-09-17 LAB — CBC
HEMATOCRIT: 25.9 % — AB (ref 36.0–46.0)
Hemoglobin: 8.3 g/dL — ABNORMAL LOW (ref 12.0–15.0)
MCH: 29.5 pg (ref 26.0–34.0)
MCHC: 32 g/dL (ref 30.0–36.0)
MCV: 92.2 fL (ref 78.0–100.0)
PLATELETS: 245 10*3/uL (ref 150–400)
RBC: 2.81 MIL/uL — AB (ref 3.87–5.11)
RDW: 17.6 % — ABNORMAL HIGH (ref 11.5–15.5)
WBC: 10.2 10*3/uL (ref 4.0–10.5)

## 2014-09-17 LAB — GLUCOSE, CAPILLARY
Glucose-Capillary: 141 mg/dL — ABNORMAL HIGH (ref 70–99)
Glucose-Capillary: 185 mg/dL — ABNORMAL HIGH (ref 70–99)
Glucose-Capillary: 122 mg/dL — ABNORMAL HIGH (ref 70–99)

## 2014-09-17 LAB — CLOSTRIDIUM DIFFICILE BY PCR: Toxigenic C. Difficile by PCR: NEGATIVE

## 2014-09-17 MED ORDER — DILTIAZEM HCL 30 MG PO TABS
30.0000 mg | ORAL_TABLET | Freq: Four times a day (QID) | ORAL | Status: DC
  Administered 2014-09-17 – 2014-09-30 (×44): 30 mg via ORAL
  Filled 2014-09-17 (×47): qty 1

## 2014-09-17 NOTE — Clinical Social Work Note (Signed)
Clinical Social Worker attempted to contact patient daughter and granddaughter to complete assessment and initiate SNF search.  CSW left a voicemail for patient granddaughter and per RN patient daughter [place] from [place] and will be present later this evening.  CSW to communicate with patient family and follow up regarding patient discharge disposition.  Patient is scheduled for PEG placement on Friday 10/30 at 10:00am.  CSW remains available for support and to facilitate patient discharge needs.  Macario GoldsJesse Jaymen Fetch, [place] [phone]

## 2014-09-17 NOTE — Progress Notes (Signed)
Patient Name: Susan Calhoun Date of Encounter: 09/17/2014     Principal Problem:   Acute encephalopathy Active Problems:   Diabetes mellitus with diabetic polyneuropathy   A-fib   Hypertension   UTI (urinary tract infection)   Intracerebral hemorrhage   Acute diastolic heart failure   Wide-complex tachycardia    SUBJECTIVE  Patient non verbal. Seems in no acute distress. Breathing does not look labored.  CURRENT MEDS . amantadine  200 mg Oral BID  . amiodarone  200 mg Per Tube Daily  . antiseptic oral rinse  7 mL Mouth Rinse q12n4p  . chlorhexidine  15 mL Mouth Rinse BID  . diltiazem  30 mg Oral Q8H  . furosemide  40 mg Intravenous Daily  . insulin aspart  0-15 Units Subcutaneous Q6H  . latanoprost  1 drop Right Eye QHS  . metoprolol tartrate  50 mg Per Tube BID  . pantoprazole sodium  40 mg Per Tube Daily  . piperacillin-tazobactam (ZOSYN)  IV  3.375 g Intravenous 3 times per day  . potassium chloride  40 mEq Oral Daily  . vancomycin  1,000 mg Intravenous Q24H    OBJECTIVE  Filed Vitals:   09/17/14 0900 09/17/14 1000 09/17/14 1100 09/17/14 1125  BP: 157/62 151/70 148/73   Pulse: 107 99 88   Temp:    98.1 F (36.7 C)  TempSrc:    Axillary  Resp: 26 24 32   Height:      Weight:      SpO2: 100% 100% 100%     Intake/Output Summary (Last 24 hours) at 09/17/14 1129 Last data filed at 09/17/14 1100  Gross per 24 hour  Intake   2300 ml  Output   3420 ml  Net  -1120 ml   Filed Weights   09/08/14 0200 09/14/14 0400 09/15/14 0400  Weight: 133 lb 9.6 oz (60.6 kg) 145 lb 8.1 oz (66 kg) 146 lb 2.6 oz (66.3 kg)    PHYSICAL EXAM  General: Pleasant, NAD. Non verbal. Neuro: Alert and oriented X 3. Moves all extremities spontaneously. Psych: Normal affect. HEENT:  Normal  Neck: Supple without bruits or JVD. Lungs:  Resp regular and unlabored, CTA. Heart: irreg irreg no s3, s4, or murmurs. Abdomen: Soft, non-tender, non-distended, BS + x 4.  Extremities:  No clubbing, cyanosis or edema. DP/PT/Radials 2+ and equal bilaterally. SCDs in place. Trace edema  Accessory Clinical Findings  CBC  Recent Labs  09/16/14 0400 09/17/14 0458  WBC 11.0* 10.2  HGB 8.4* 8.3*  HCT 26.1* 25.9*  MCV 91.3 92.2  PLT 246 245   Basic Metabolic Panel  Recent Labs  09/16/14 0400 09/16/14 0850 09/17/14 0458  NA 139  --  141  K 3.6*  --  4.4  CL 104  --  106  CO2 23  --  23  GLUCOSE 147*  --  145*  BUN 39*  --  35*  CREATININE 1.15*  --  1.20*  CALCIUM 7.9*  --  7.9*  MG  --  2.0  --   PHOS  --  3.6  --     Cardiac Enzymes  Recent Labs  09/16/14 0850 09/16/14 1430  TROPONINI 0.30* <0.30    TELE  atrial fib with controlled VR. No more afib with abberency  Radiology/Studies  Dg Chest 2 View  08/31/2014   CLINICAL DATA:  Disoriented. History of atrial fibrillation. Initial encounter.  EXAM: CHEST  2 VIEW  COMPARISON:  None.  FINDINGS: Borderline enlarged  cardiac silhouette and mediastinal contours with atherosclerotic plaque within the thoracic aorta. Post median sternotomy. Evaluation retrosternal clear space obscured secondary to overlying soft tissues. The lungs appear mildly hyperexpanded with mild diffuse slightly nodular thickening of the pulmonary interstitium. No focal airspace opacities. No pleural effusion or pneumothorax. No evidence of edema. No acute osseus abnormalities.  IMPRESSION: Borderline cardiomegaly and lung hyperexpansion without acute cardiopulmonary disease.   Electronically Signed   By: Simonne Come M.D.   On: 08/31/2014 21:03   Ct Head Wo Contrast  09/14/2014   CLINICAL DATA:  Intraventricular hemorrhage. Altered mental status. Encephalopathy.  EXAM: CT HEAD WITHOUT CONTRAST  TECHNIQUE: Contiguous axial images were obtained from the base of the skull through the vertex without intravenous contrast.  COMPARISON:  CT head 09/11/2014 and MRI brain 09/12/2014.  FINDINGS: The right frontal ventriculostomy catheter has been  removed. There is some residual edema along the ventriculostomy tract. Ventricular size is stable is measured across the frontal horns. Third ventricular size is stable is well there is some blood layering within the lateral ventricles bilaterally. The fourth ventricle is more normal in size.  Periventricular white matter changes are stable. A focal density in the right parietal lobe is unchanged. This likely represents a small calcification given its stability over time and lack of significant artifact on the MRI. Focal hemorrhage within the left cerebral peduncle is stable.  IMPRESSION: 1. Stable appearance of hydrocephalus involving the lateral and third ventricles with some residual layering blood products. 2. Persisting gas within the frontal horn of the right lateral ventricle following removal of the ventriculostomy catheter. 3. Stable mild diffuse white matter disease without evidence for acute infarct or significant interval change. 4. Stable focal hemorrhage within the left cerebral peduncle.   Electronically Signed   By: Gennette Pac M.D.   On: 09/14/2014 11:13   Ct Head Wo Contrast  09/11/2014   CLINICAL DATA:  Hydrocephalus.  Clamped intraventricular catheter.  EXAM: CT HEAD WITHOUT CONTRAST  TECHNIQUE: Contiguous axial images were obtained from the base of the skull through the vertex without intravenous contrast.  COMPARISON:  CT 09/08/2014  FINDINGS: Right frontal ventricular catheter extends into the third ventricle unchanged in position. Hydrocephalus of the third fourth and lateral ventricles is unchanged. Layering high density blood in the occipital horns bilaterally slightly improved. No new hemorrhage in the ventricles  Small 5 mm hyperdensity in the right parietal lobe may represent some subarachnoid hemorrhage or calcification. This is unchanged over serial exams.  IMPRESSION: Normal ventricular enlargement is stable. Intraventricular hemorrhage slightly improved. Small area of  hemorrhage or calcification in the right parietal lobe is stable.   Electronically Signed   By: Marlan Palau M.D.   On: 09/11/2014 08:02   Ct Head Wo Contrast  09/08/2014   CLINICAL DATA:  Altered mental status with acute encephalopathy and somnolence. Hydrocephalus on recent imaging.  EXAM: CT HEAD WITHOUT CONTRAST  TECHNIQUE: Contiguous axial images were obtained from the base of the skull through the vertex without intravenous contrast.  COMPARISON:  Head CT 09/04/2014  FINDINGS: Images are mildly to moderately degraded by motion artifact.  Right frontal approach ventriculostomy catheter remains in place terminating in the third ventricle, unchanged. Dilatation of the third ventricle has not significantly changed, measuring approximately 1.5 cm in diameter. Slightly increased dilatation of the temporal horns of the lateral ventricles is questioned, however apparent differences may be due to differences in angulation compared with the prior CT. The temporal horns do not appear  significantly changed in size compared to the 09/03/2014 MRI. Small amount of blood layering in the occipital horns of lateral ventricles does not appear significantly changed. Trace subarachnoid blood is suspected in a right parietal sulcus (series 2, image 18). There is also likely a small amount of subarachnoid blood in the posterior aspects of the right greater than left sylvian fissures. There is no evidence of acute large territory cortical infarct. There is no midline shift or mass.  Prior bilateral cataract extraction is noted. There is minimal right and mild left maxillary sinus mucosal thickening. Mild sphenoid sinus mucosal thickening is also noted. Mastoid air cells are clear. Carotid siphon calcification is noted. Enteric tube is partially visualized.  IMPRESSION: 1. Unchanged dilatation of the third ventricle and likely no significant interval change in dilatation of the lateral ventricles. 2. Unchanged intraventricular  blood. 3. Small volume of subarachnoid blood.   Electronically Signed   By: Sebastian Ache   On: 09/08/2014 11:02   Ct Head Wo Contrast  09/05/2014   CLINICAL DATA:  Follow-up examination for hydrocephalus.  EXAM: CT HEAD WITHOUT CONTRAST  TECHNIQUE: Contiguous axial images were obtained from the base of the skull through the vertex without intravenous contrast.  COMPARISON:  Prior MRI from 09/03/2014 and CT from 08/31/2014  FINDINGS: There has been interval placement of a right frontal approach ventriculostomy catheter with tip in the third ventricle. The ventricles remain dilated, however, overall ventricular size is slightly decreased relative to prior study. There is layering hemorrhage within the occipital horns of both lateral ventricles, grossly similar to previous. No definite new intracranial hemorrhage. Faint linear hyperdensity at the right sylvian fissure may represent a small amount of subarachnoid hemorrhage (series 4, image 8).  No acute large vessel territory infarct. No midline shift. Basilar cisterns remain patent. No extra-axial fluid collection.  Calvarium intact. Scalp soft tissues within normal limits. No acute abnormality seen about either orbit.  Nasogastric tube partially visualized. There is mild opacity within the sphenoid sinuses bilaterally with scattered mucoperiosteal thickening within the maxillary sinuses. No mastoid effusion.  IMPRESSION: 1. Interval placement of right frontal approach ventriculostomy catheter with tip in the third ventricle. Ventricles remain dilated but are slightly decreased in size relative to previous MRI from 09/03/2014. 2. Layering hemorrhage within the occipital horns of the lateral ventricles bilaterally, stable from prior. 3. Question trace acute subarachnoid hemorrhage within the right sylvian fissure.   Electronically Signed   By: Rise Mu M.D.   On: 09/05/2014 00:35   Ct Head Wo Contrast  08/31/2014   CLINICAL DATA:  Disoriented.   Initial encounter.  EXAM: CT HEAD WITHOUT CONTRAST  TECHNIQUE: Contiguous axial images were obtained from the base of the skull through the vertex without intravenous contrast.  COMPARISON:  None.  FINDINGS: There is mild, likely age-appropriate, centralized volume loss with mild ex vacuo dilatation of the ventricular system. Gray-white differentiation is maintained without CT evidence of acute large territory infarct. No intraparenchymal or extra-axial mass or hemorrhage. Intracranial atherosclerosis. Limited visualization of the paranasal sinuses and mastoid air cells is normal. No air-fluid levels. Note is made of mild hyperostosis frontalis. Post bilateral cataract surgery. Regional soft tissues appear normal. No displaced calvarial fracture.  IMPRESSION: Mild, likely age-appropriate, centralized volume loss without acute intracranial process.   Electronically Signed   By: Simonne Come M.D.   On: 08/31/2014 21:01   Mr Laqueta Jean ZO Contrast  09/12/2014   CLINICAL DATA:  Encephalopathy.  Ventriculostomy drain removal  EXAM:  MRI HEAD WITHOUT AND WITH CONTRAST  TECHNIQUE: Multiplanar, multiecho pulse sequences of the brain and surrounding structures were obtained without and with intravenous contrast.  CONTRAST:  13mL MULTIHANCE GADOBENATE DIMEGLUMINE 529 MG/ML IV SOLN  COMPARISON:  CT head 09/11/2014  FINDINGS: Interval removal of right ventricular drain catheter. There is now gas in the right frontal horn which was not prepped yesterday. Diffuse ventricular enlargement is unchanged. There is blood layering in the occipital horns bilaterally. Small amount of blood also present in the left midbrain adjacent to the previous location of the tip of the catheter. There is mild subarachnoid hemorrhage.  Negative for ischemic acute infarct. Negative for mass lesion. No shift of the midline structures.  Postcontrast imaging reveals no enhancing mass lesion. Leptomeningeal enhancement is normal.  IMPRESSION: Diffuse  ventricular enlargement unchanged from CT scan yesterday. Findings suggest communicating hydrocephalus. Interval removal ventricular catheter with gas now in the right frontal horn.  Intraventricular hemorrhage and subarachnoid hemorrhage appears stable.  Negative for acute ischemic infarct.   Electronically Signed   By: Marlan Palau M.D.   On: 09/12/2014 13:24   Mr Laqueta Jean ZO Contrast  09/03/2014   CLINICAL DATA:  New onset altered mental status. Disorientation. Incontinence. Acute encephalopathy. Somnolence.  EXAM: MRI HEAD WITHOUT AND WITH CONTRAST  TECHNIQUE: Multiplanar, multiecho pulse sequences of the brain and surrounding structures were obtained without and with intravenous contrast.  CONTRAST:  13mL MULTIHANCE GADOBENATE DIMEGLUMINE 529 MG/ML IV SOLN  COMPARISON:  Head CT 08/31/2014  FINDINGS: Diffusion imaging does not show any acute or subacute infarction.  There is hydrocephalus of the lateral and third ventricles. There is layering material in the occipital forms of both lateral ventricles. This shows some susceptibility suggesting that it may be in part hemorrhagic. There appears to be slightly more susceptibility in the region of the atrium of the right lateral ventricle. This could be a focal collide or could indicate that this site of hemorrhage could be the choroid in that region. I think the ventricles are slightly more prominent than were seen on the CT scan of 3 days ago. The brain parenchyma shows minimal small vessel change of the deep white matter. Some of the periventricular signal intensity could relate to transependymal resorption of CSF. After contrast administration, no abnormal enhancement occurs. No evidence of mass lesion. No extra-axial collection. No pituitary mass. No inflammatory sinus disease. No skull or skullbase lesion.  IMPRESSION: Acute hydrocephalus, worsened since 3 days ago. Layering material in the occipital horns of lateral ventricles, at least partly  hemorrhagic. Question focal thrombus in the atrium of the right lateral ventricle, possibly associated with the choroid. This could be the site of hemorrhage. No specific vascular lesion is identified. No intraparenchymal hemorrhage. The differential diagnosis does include meningitis.  I am in the process of contacting the clinical team to discuss this result. Pages are pending at this time.   Electronically Signed   By: Paulina Fusi M.D.   On: 09/03/2014 13:18   Dg Chest Port 1 View  09/17/2014   CLINICAL DATA:  Congestive heart failure  EXAM: PORTABLE CHEST - 1 VIEW  COMPARISON:  09/14/2014  FINDINGS: Cardiomegaly again noted. Central vascular congestion without convincing pulmonary edema. Persistent small bilateral pleural effusion with bilateral basilar atelectasis or infiltrate. Status post median sternotomy. Stable NG tube and left IJ central line position.  IMPRESSION: Central vascular congestion without convincing pulmonary edema. Persistent small bilateral pleural effusion with bilateral basilar atelectasis or infiltrate. Status post median  sternotomy. Stable NG tube and left IJ central line position.   Electronically Signed   By: Natasha MeadLiviu  Pop M.D.   On: 09/17/2014 09:25   Dg Chest Port 1 View  09/14/2014   CLINICAL DATA:  Shortness of breath  EXAM: PORTABLE CHEST - 1 VIEW  COMPARISON:  09/13/2012  FINDINGS: Stable support apparatus. Previous coronary bypass noted. Mild cardiomegaly with persistent vascular congestion and small effusions. Stable basilar opacities, favor atelectasis over pneumonia. No pneumothorax. Degenerative changes of the spine.  IMPRESSION: Stable cardiomegaly with vascular congestion  Persistent mild bibasilar atelectasis, less likely airspace disease.  Small pleural effusions.   Electronically Signed   By: Ruel Favorsrevor  Shick M.D.   On: 09/14/2014 08:50   Dg Chest Port 1 View  09/13/2014   CLINICAL DATA:  Fever.  EXAM: PORTABLE CHEST - 1 VIEW  COMPARISON:  09/06/2014  FINDINGS:  Left central line and feeding tube remain in place, unchanged. Cardiomegaly with vascular congestion. Bilateral lower lobe airspace opacities have increased since prior study. Cannot exclude small bilateral effusions. No acute bony abnormality.  IMPRESSION: Increasing bibasilar atelectasis or infiltrates. Suspect small effusions.   Electronically Signed   By: Charlett NoseKevin  Dover M.D.   On: 09/13/2014 01:44    ASSESSMENT AND PLAN Susan Calhoun is a 80 y.o. female with a history of CAD s/p CABG, PAF s/p ablation 04/2014, HTN, anemia, glaucoma,and PVD s/p carotid endarterectomy who [facility] with AMS and was found to have acute hydrocephalus with concerns for meningitis on MRI. [facility]. The patient was noted to go back into atrial fib with RVR during her acute illness. There was also some wide complex tachycardia noted and cardiology was consulted.  #1. Wide complex tachycardia- Strips reviewed by Dr. Katrinka BlazingSmith yesterday who believed that the wide complex tachycardia represents atrial fibrillation with aberrancy/rate related bundle branch block and not NSVT -- see number #2.  #2. Atrial fibrillation with RVR -- Better controlled on Lopressor 50mg  BID, diltiazem 30mg  q6hrs and Amiodarone 200mg  qd. Now rates under 100. -- Repeat 2D ECHO pending  #3. Acute on chronic diastolic CHF-  -- BNP elevated at 9K and CXR with central vascular congestion w/o convincing pulmonary edema. Persistent small bilateral pleural effusions -- Continue IV Lasix 40mg   -- Repeat 2D ECHO pending  #4. Hydrocephalus- partly hemorrhagic.   -- She is SP right frontal IVC placement.  -- Acyclovir for VZV. ASA and coumadin discontinued  #5. CAD s/p CABG -- Troponin initially considered barely positive at 0.30. However, subsequent trop neg. -- ASA d/c'd due to concern for brain hemorrhage. Continue BB. Statin being held     Signed, Janetta HoraHOMPSON, KATHRYN R PA-C  Pager  [phone]  I have personally seen and examined this patient with Carlean JewsKatie Thompson, PA-C. I agree with the assessment and plan as outlined above. Wide complex rhythm likely atrial fib with aberrancy. Rate well controlled on current therapy. Echo pending today. We will follow with you.   Susan Calhoun 09/17/2014 12:23 PM

## 2014-09-17 NOTE — Progress Notes (Signed)
STROKE TEAM PROGRESS NOTE   HISTORY Susan Calhoun is an 80 y.o. female who was noted by family to be acting confused > 2 weeks prior. Patient was brought to the hospital where she was diagnosed with a UTI. She was given Rocephin and sent home on Keflex. Due to not being awake enough to take her medications she was brought back to the hospital. MRI brain showed "layering material in the occipital horns of lateral ventricles, at least partly hemorrhagic. Question focal thrombus in the atrium of the right lateral ventricle, possibly associated with the choroid. This could be the site of hemorrhage". Infectious disease was consulted and they do not feel this is bacterial meningoencephalitis and ABX were D/C'd. Neuro surgery placed a Ventriculostomy drain for HCP. Since drain placement her mentation has not fully resolved. Repeat head CT shows no worsening of HCP or blood products. Neurology was asked to further evaluate. At time of evaluation, patient intubated on the vent, Temp 100.8, WBC 12.2. CSF: WBC 168, red blood cells 70000, lymph 7, neutrophile 85, glucose 78, protein 139   SUBJECTIVE (INTERVAL HISTORY) Patient had wide complex tachycardia, given metoprolol IV and it resolved. Increase metoprolol 25mg  po to 50mg  po bid. Had cardiology consult and considered Afib with aberrancy/rate related bundle branch block. Recommend repeat 2D echo and continue BB. Pt his morning open eyes on voice briefly, mental status improved a little from yesterday.   OBJECTIVE Temp:  [98.1 F (36.7 C)-99.8 F (37.7 C)] 98.1 F (36.7 C) (10/28 1125) Pulse Rate:  [44-116] 73 (10/28 1200) Cardiac Rhythm:  [-] Atrial fibrillation (10/28 0800) Resp:  [24-32] 30 (10/28 1200) BP: (131-176)/(48-77) 150/55 mmHg (10/28 1200) SpO2:  [92 %-100 %] 100 % (10/28 1200)   Recent Labs Lab 09/16/14 1133 09/16/14 1738 09/16/14 2308 09/17/14 0525 09/17/14 1124  GLUCAP 150* 157* 166* 141* 185*    Recent Labs Lab  09/12/14 2300 09/14/14 0815 09/15/14 0440 09/16/14 0400 09/16/14 0850 09/17/14 0458  NA 137 136* 139 139  --  141  K 4.4 3.8 4.0 3.6*  --  4.4  CL 103 100 103 104  --  106  CO2 23 23 22 23   --  23  GLUCOSE 175* 137* 124* 147*  --  145*  BUN 34* 42* 45* 39*  --  35*  CREATININE 1.17* 1.25* 1.34* 1.15*  --  1.20*  CALCIUM 7.9* 7.6* 7.6* 7.9*  --  7.9*  MG  --   --   --   --  2.0  --   PHOS  --   --   --   --  3.6  --    No results found for this basename: AST, ALT, ALKPHOS, BILITOT, PROT, ALBUMIN,  in the last 168 hours  Recent Labs Lab 09/12/14 2300 09/14/14 0815 09/15/14 0440 09/16/14 0400 09/17/14 0458  WBC 13.2* 11.9* 11.2* 11.0* 10.2  HGB 9.5* 8.5* 8.1* 8.4* 8.3*  HCT 29.7* 26.2* 24.9* 26.1* 25.9*  MCV 93.4 90.3 91.5 91.3 92.2  PLT 233 248 244 246 245    Recent Labs Lab 09/16/14 0850 09/16/14 1430  TROPONINI 0.30* <0.30   No results found for this basename: LABPROT, INR,  in the last 72 hours No results found for this basename: COLORURINE, APPERANCEUR, LABSPEC, PHURINE, GLUCOSEU, HGBUR, BILIRUBINUR, KETONESUR, PROTEINUR, UROBILINOGEN, NITRITE, LEUKOCYTESUR,  in the last 72 hours     Component Value Date/Time   CHOL 204* 09/02/2014 0530   TRIG 149 09/02/2014 0530   HDL 64 09/02/2014  0530   CHOLHDL 3.2 09/02/2014 0530   VLDL 30 09/02/2014 0530   LDLCALC 110* 09/02/2014 0530   Lab Results  Component Value Date   HGBA1C 5.6 09/01/2014   No results found for this basename: labopia,  cocainscrnur,  labbenz,  amphetmu,  thcu,  labbarb    No results found for this basename: ETH,  in the last 168 hours  I have personally reviewed the radiological images below and agree with the radiology interpretations.  Ct Head Wo Contrast 09/14/14-  1. Stable appearance of hydrocephalus involving the lateral and third ventricles with some residual layering blood products.  2. Persisting gas within the frontal horn of the right lateral ventricle following removal of the  ventriculostomy catheter.  3. Stable mild diffuse white matter disease without evidence for acute infarct or significant interval change.  4. Stable focal hemorrhage within the left cerebral peduncle. 09/11/14 - Normal ventricular enlargement is stable. Intraventricular hemorrhage slightly improved. Small area of hemorrhage or calcification in the right parietal lobe is stable.  09/08/2014  1. Unchanged dilatation of the third ventricle and likely no significant interval change in dilatation of the lateral ventricles. 2. Unchanged intraventricular blood. 3. Small volume of subarachnoid blood.    09/05/2014   1. Interval placement of right frontal approach ventriculostomy catheter with tip in the third ventricle. Ventricles remain dilated but are slightly decreased in size relative to previous MRI from 09/03/2014. 2. Layering hemorrhage within the occipital horns of the lateral ventricles bilaterally, stable from prior. 3. Question trace acute subarachnoid hemorrhage within the right sylvian fissure.     08/31/2014  Mild, likely age-appropriate, centralized volume loss without acute intracranial process.     [name] Contrast 09/12/14 Diffuse ventricular enlargement unchanged from CT scan yesterday. Findings suggest communicating hydrocephalus. Interval removal ventricular catheter with gas now in the right frontal horn. Intraventricular hemorrhage and subarachnoid hemorrhage appears  stable. Negative for acute ischemic infarct. 09/03/2014    Acute hydrocephalus, worsened since 3 days ago. Layering material in the occipital horns of lateral ventricles, at least partly hemorrhagic. Question focal thrombus in the atrium of the right lateral ventricle, possibly associated with the choroid. This could be the site of hemorrhage. No specific vascular lesion is identified. No intraparenchymal hemorrhage. The differential diagnosis does include meningitis.    Dg Chest Port 1 View 09/06/2014   Feeding tube  with weighted tip in stomach.   09/04/2014    New left jugular central venous catheter tip overlies the distal SVC. No evidence of pneumothorax.  Stable cardiomegaly.  No active lung disease.     Dg Chest 2 View 08/31/2014    Borderline cardiomegaly and lung hyperexpansion without acute cardiopulmonary disease.    EEG 09/11/14 : This is a markedly abnormal electroencephalogram secondary to general background slowing with frequent intermittent discharges of triphasic morphology. Although this may be seen with a severe metabolic encephalopathy can not rule out the possibility that this may represent epileptic phenomenon. Clinical correlation recommended.   2D echo 09/17/14 - - Left ventricle: The cavity size was normal. Wall thickness was normal. Systolic function was normal. The estimated ejection fraction was in the range of 50% to 55%. Wall motion was normal; there were no regional wall motion abnormalities. - Mitral valve: Calcified annulus. There was moderate regurgitation. - Left atrium: The atrium was moderately dilated. - Pulmonary arteries: PA peak pressure: 35 mm Hg (S).   PHYSICAL EXAM Frail elderly Caucasian lady not in distress. Afebrile. Head is  nontraumatic. Neck is supple without bruit.  Cardiac exam no murmur or gallop, but tachycardia. Lungs are clear to auscultation. Distal pulses are well felt.  Neurological Exam: obtunded, not open eyes on voice or pain stimulation, not following commands. Pupils exam showed left surgical pupil, left sluggish to light. Slight dysconjugate gaze with left eye hypotropia. Roving eye movements present. Does not blink to threat on either side. Fundi could not be visualized. Face is symmetric. Tongue is midline. BLE withdraw to pain but not against gravity, LUE flaccid 0/5 on pain stimulation. RUE extension on pain stimulation and rigidity with cog-wheeling. Left plantar upgoing right equivocal. Sensation and coordination cannot be reliably tested.  Gait was not tested.  ASSESSMENT/PLAN Susan Calhoun is a 80 y.o. female with history of altered mental status following UTI, seen in the ED 08/31/2014 - admitted 09/01/2014 with confusion. Patient was on coumadin at that time for atrial fibrillation. INR 1.84. Administered Levaquin. BP elevated on the 11th at 166/44, max BP next day 189/112. Pt INR trending up and 2.10 on 09/03/14. In retrospect, initial CT with L IVH. Repeat CT done during workup found to have worsening of IVH with bilateral occipital intraventricular blood with hydrocephalus. Hypertension and coumadin associated coagulapathy etiology of hemorrhage.   Stroke:  Non-dominant IVH secondary to coumadin associated coagulopathy in setting of uncontrolled hypertension.  Intraventricular drain placed with no improvement of mental status.  CSF without source of infection.   EVD removed  SCDs for VTE prophylaxis NPO on TF, will call trauma surgery for PEG placement, hopefully tomorrow.  Bedrest    warfarin prior to admission, no antithrombotics now.  Resultant confusion, unable to follow commands mental status disproportionate to amount of IVH and CSF pressure.  Therapy recommendations: SNF  Disposition:  SNF  AMS - likely due to IVH and hydrocephalus - LTM EEG did not detect seizure activity  - d/c vimpat and dilantin - Dr. Cyril Mourningamillo help appreciated - continue amantadine for arousal.   Atrial Fibrillation  Home meds:  Warfarin prior to admission  INR 1.51 on admission 10/12. High as 2.10 on 09/03/14.   No longer an anticoagulation candidate secondary to hemorrhage  Had wide complex tachycardia, cardiology consulted  Consider Afib with aberrancy/rate related bundle branch block.  Continue metoprolol 50mg  bid and cardiazem 30mg  q6   Add lasix 40mg  daily  Continue on amiodarone  Repeat 2D echo unremarkable  High BNP but normal CEs  Cardiology recs appreciated.  Hypertension  Stable, on the  higher side  BP goal < 160  On metoprolol, increase from 25mg  bid to 50mg  bid  Leukocytosis  - WBC down trending 13.2->11.9 ->11.0 -> 10.2 - culture sent so far negative - on vancon/zosyn for 2 more days to complete course - afebrile  Hyponatremia with uptrending BUN and Cre - d/c free water - NS @ 30cc - continue TF @ 45cc - daily lab - Na 141 and Cre trending down  Hyperlipidemia  Home meds:  lipitor 80   LDL 110 goal < 70  D/c lipitor due to IVH  Other Stroke Risk Factors Advanced age   Obesity, Body mass index is 29.51 kg/(m^2).    Coronary artery disease - s/p CABG ICA stenosis s/p CEA 2015 PVD  Other Active Problems  UTI with pyelonephritis  CKD   Hospital day # 16  This patient is critically ill and at significant risk of neurological worsening, death and care requires constant monitoring of vital signs, hemodynamics,respiratory and cardiac monitoring,review of multiple  databases, neurological assessment, discussion with family, other specialists and medical decision making of high complexity.I have made any additions or clarifications directly to the above note. I spent 40 minutes of neurocritical care time  in the care of  this patient.  Marvel PlanJindong Roshanna Cimino, MD PhD Stroke Neurology 09/17/2014 1:39 PM     To contact Stroke Continuity provider, please refer to [email or web address]. After hours, contact General Neurology

## 2014-09-17 NOTE — Progress Notes (Signed)
Patient arrived to room 4N26 at this time. Alert and in stable condition.

## 2014-09-17 NOTE — Progress Notes (Signed)
  Echocardiogram 2D Echocardiogram has been performed.  Arvil ChacoFoster, Declan Adamson 09/17/2014, 11:32 AM

## 2014-09-17 NOTE — Consult Note (Signed)
Ashok Pall [birth year]  [id number].   Primary Care MD: in [place] Requesting MD: Dr. Rosalin Hawking Chief Complaint/Reason for Consult: dysphagia, needs PEG HPI: This is an 80 yo female from [place] who was down here visiting her granddaughter when he had a hemorrhagic stroke secondary to coumadin.  She has had a complex hospital stay.  She now has dysphagia secondary to her CVA and in need of a PEG.  We have been asked to see her for PEG placement.  ROS: Unable to obtain secondary to AMS  History reviewed. No pertinent family history.  Past Medical History  Diagnosis Date  . Diabetes mellitus with diabetic polyneuropathy   . A-fib   . Renal disorder   . Hypertension   . Anemia   . Glaucoma     right eye   . Carotid artery stenosis   . PVD (peripheral vascular disease)     Past Surgical History  Procedure Laterality Date  . Replacement total knee Right   . Carotid endarterectomy  2015  . Coronary artery bypass graft    . Ablation      cardiac for arrthymia     Social History:  reports that she has never smoked. She does not have any smokeless tobacco history on file. She reports that she does not drink alcohol or use illicit drugs.  Allergies:  Allergies  Allergen Reactions  . Septra [Sulfamethoxazole-Trimethoprim] Hives    Medications Prior to Admission  Medication Sig Dispense Refill  . acetaminophen (TYLENOL) 500 MG tablet Take 1,000 mg by mouth 3 (three) times daily as needed (pain).      Marland Kitchen allopurinol (ZYLOPRIM) 100 MG tablet Take 100 mg by mouth 2 (two) times daily.       Marland Kitchen aspirin EC 81 MG tablet Take 81 mg by mouth daily with breakfast.       . atorvastatin (LIPITOR) 80 MG tablet Take 80 mg by mouth daily after supper.       . carvedilol (COREG) 25 MG tablet Take 25 mg by mouth 2 (two) times daily with a meal.      . cephALEXin (KEFLEX) 500 MG capsule Take 1 capsule (500 mg total) by mouth 4 (four) times daily.  28 capsule  0  . cholecalciferol (VITAMIN D) 1000 UNITS  tablet Take 1,000 Units by mouth every other day.      . Cyanocobalamin (VITAMIN B-12 PO) Take 1 tablet by mouth every other day.      . ferrous sulfate 325 (65 FE) MG tablet Take 325 mg by mouth daily with breakfast.      . furosemide (LASIX) 20 MG tablet Take 20 mg by mouth 2 (two) times daily.       Marland Kitchen lisinopril (PRINIVIL,ZESTRIL) 5 MG tablet Take 5 mg by mouth daily with breakfast.       . metFORMIN (GLUCOPHAGE) 500 MG tablet Take 500 mg by mouth daily with breakfast.       . NIFEdipine (NIFEDICAL XL) 60 MG 24 hr tablet Take 60 mg by mouth daily after supper.       . Travoprost, BAK Free, (TRAVATAN) 0.004 % SOLN ophthalmic solution Place 1 drop into the right eye at bedtime.      Marland Kitchen warfarin (COUMADIN) 5 MG tablet Take 5-10 mg by mouth daily after supper. Take 2 tablets (10 mg) on Monday, then take 1 tablet (5 mg) on Tuesday thru Sunday        Blood pressure 153/44, pulse 78, temperature 98.1 F (  36.7 C), temperature source Axillary, resp. rate 25, height 4' 11"  (1.499 m), weight 146 lb 2.6 oz (66.3 kg), SpO2 100.00%. Physical Exam: General: ill-appearing, female who is laying in bed in NAD HEENT: head is normocephalic, but right sided incision with staples from prior IVC.  Sclera are noninjected.  PERRL.  Ears and nose without any masses or lesions.   Heart: regular, rate, and rhythm.  Normal s1,s2. No obvious murmurs, gallops, or rubs noted.  Palpable radial and pedal pulses bilaterally Lungs: CTAB, no wheezes, rhonchi, or rales noted.  Respiratory effort nonlabored Abd: soft, NT, ND, +BS, no masses, hernias, or organomegaly Psych: arouses, but unable to carry a conversation due to AMS.  Can not truly assess psych status.    Results for orders placed during the hospital encounter of 09/01/14 (from the past 48 hour(s))  GLUCOSE, CAPILLARY     Status: Abnormal   Collection Time    09/15/14  3:50 PM      Result Value Ref Range   Glucose-Capillary 137 (*) 70 - 99 mg/dL   Comment 1  Notify RN     Comment 2 Documented in Chart    GLUCOSE, CAPILLARY     Status: Abnormal   Collection Time    09/15/14  7:33 PM      Result Value Ref Range   Glucose-Capillary 134 (*) 70 - 99 mg/dL  VANCOMYCIN, TROUGH     Status: None   Collection Time    09/15/14 10:40 PM      Result Value Ref Range   Vancomycin Tr 12.8  10.0 - 20.0 ug/mL  GLUCOSE, CAPILLARY     Status: Abnormal   Collection Time    09/15/14 11:01 PM      Result Value Ref Range   Glucose-Capillary 104 (*) 70 - 99 mg/dL   Comment 1 Documented in Chart     Comment 2 Notify RN    GLUCOSE, CAPILLARY     Status: Abnormal   Collection Time    09/16/14  3:10 AM      Result Value Ref Range   Glucose-Capillary 151 (*) 70 - 99 mg/dL   Comment 1 Documented in Chart     Comment 2 Notify RN    CBC     Status: Abnormal   Collection Time    09/16/14  4:00 AM      Result Value Ref Range   WBC 11.0 (*) 4.0 - 10.5 K/uL   RBC 2.86 (*) 3.87 - 5.11 MIL/uL   Hemoglobin 8.4 (*) 12.0 - 15.0 g/dL   HCT 26.1 (*) 36.0 - 46.0 %   MCV 91.3  78.0 - 100.0 fL   MCH 29.4  26.0 - 34.0 pg   MCHC 32.2  30.0 - 36.0 g/dL   RDW 17.3 (*) 11.5 - 15.5 %   Platelets 246  150 - 400 K/uL  BASIC METABOLIC PANEL     Status: Abnormal   Collection Time    09/16/14  4:00 AM      Result Value Ref Range   Sodium 139  137 - 147 mEq/L   Potassium 3.6 (*) 3.7 - 5.3 mEq/L   Chloride 104  96 - 112 mEq/L   CO2 23  19 - 32 mEq/L   Glucose, Bld 147 (*) 70 - 99 mg/dL   BUN 39 (*) 6 - 23 mg/dL   Creatinine, Ser 1.15 (*) 0.50 - 1.10 mg/dL   Calcium 7.9 (*) 8.4 - 10.5 mg/dL  GFR calc non Af Amer 42 (*) >90 mL/min   GFR calc Af Amer 49 (*) >90 mL/min   Comment: (NOTE)     The eGFR has been calculated using the CKD EPI equation.     This calculation has not been validated in all clinical situations.     eGFR's persistently <90 mL/min signify possible Chronic Kidney     Disease.   Anion gap 12  5 - 15  GLUCOSE, CAPILLARY     Status: None   Collection Time     09/16/14  7:35 AM      Result Value Ref Range   Glucose-Capillary 89  70 - 99 mg/dL   Comment 1 Documented in Chart     Comment 2 Notify RN    MAGNESIUM     Status: None   Collection Time    09/16/14  8:50 AM      Result Value Ref Range   Magnesium 2.0  1.5 - 2.5 mg/dL  PHOSPHORUS     Status: None   Collection Time    09/16/14  8:50 AM      Result Value Ref Range   Phosphorus 3.6  2.3 - 4.6 mg/dL  TROPONIN I     Status: Abnormal   Collection Time    09/16/14  8:50 AM      Result Value Ref Range   Troponin I 0.30 (*) <0.30 ng/mL   Comment:            Due to the release kinetics of cTnI,     a negative result within the first hours     of the onset of symptoms does not rule out     myocardial infarction with certainty.     If myocardial infarction is still suspected,     repeat the test at appropriate intervals.     CRITICAL RESULT CALLED TO, READ BACK BY AND VERIFIED WITH:     CASEY BRUNO,RN AT 0932 09/16/14 BY ZBEECH.  POCT I-STAT 3, ART BLOOD GAS (G3+)     Status: Abnormal   Collection Time    09/16/14  9:06 AM      Result Value Ref Range   pH, Arterial 7.443  7.350 - 7.450   pCO2 arterial 33.2 (*) 35.0 - 45.0 mmHg   pO2, Arterial 86.0  80.0 - 100.0 mmHg   Bicarbonate 22.7  20.0 - 24.0 mEq/L   TCO2 24  0 - 100 mmol/L   O2 Saturation 97.0     Acid-base deficit 1.0  0.0 - 2.0 mmol/L   Patient temperature 98.9 F     Collection site RADIAL, ALLEN'S TEST ACCEPTABLE     Drawn by RT     Sample type ARTERIAL    GLUCOSE, CAPILLARY     Status: Abnormal   Collection Time    09/16/14 11:33 AM      Result Value Ref Range   Glucose-Capillary 150 (*) 70 - 99 mg/dL   Comment 1 Notify RN     Comment 2 Documented in Chart    TROPONIN I     Status: None   Collection Time    09/16/14  2:30 PM      Result Value Ref Range   Troponin I <0.30  <0.30 ng/mL   Comment:            Due to the release kinetics of cTnI,     a negative result within the first hours     of  the onset of  symptoms does not rule out     myocardial infarction with certainty.     If myocardial infarction is still suspected,     repeat the test at appropriate intervals.  PRO B NATRIURETIC PEPTIDE     Status: Abnormal   Collection Time    09/16/14  5:30 PM      Result Value Ref Range   Pro B Natriuretic peptide (BNP) 9594.0 (*) 0 - 450 pg/mL  GLUCOSE, CAPILLARY     Status: Abnormal   Collection Time    09/16/14  5:38 PM      Result Value Ref Range   Glucose-Capillary 157 (*) 70 - 99 mg/dL  GLUCOSE, CAPILLARY     Status: Abnormal   Collection Time    09/16/14 11:08 PM      Result Value Ref Range   Glucose-Capillary 166 (*) 70 - 99 mg/dL   Comment 1 Documented in Chart     Comment 2 Notify RN    CBC     Status: Abnormal   Collection Time    09/17/14  4:58 AM      Result Value Ref Range   WBC 10.2  4.0 - 10.5 K/uL   RBC 2.81 (*) 3.87 - 5.11 MIL/uL   Hemoglobin 8.3 (*) 12.0 - 15.0 g/dL   HCT 25.9 (*) 36.0 - 46.0 %   MCV 92.2  78.0 - 100.0 fL   MCH 29.5  26.0 - 34.0 pg   MCHC 32.0  30.0 - 36.0 g/dL   RDW 17.6 (*) 11.5 - 15.5 %   Platelets 245  150 - 400 K/uL  BASIC METABOLIC PANEL     Status: Abnormal   Collection Time    09/17/14  4:58 AM      Result Value Ref Range   Sodium 141  137 - 147 mEq/L   Potassium 4.4  3.7 - 5.3 mEq/L   Chloride 106  96 - 112 mEq/L   CO2 23  19 - 32 mEq/L   Glucose, Bld 145 (*) 70 - 99 mg/dL   BUN 35 (*) 6 - 23 mg/dL   Creatinine, Ser 1.20 (*) 0.50 - 1.10 mg/dL   Calcium 7.9 (*) 8.4 - 10.5 mg/dL   GFR calc non Af Amer 40 (*) >90 mL/min   GFR calc Af Amer 47 (*) >90 mL/min   Comment: (NOTE)     The eGFR has been calculated using the CKD EPI equation.     This calculation has not been validated in all clinical situations.     eGFR's persistently <90 mL/min signify possible Chronic Kidney     Disease.   Anion gap 12  5 - 15  GLUCOSE, CAPILLARY     Status: Abnormal   Collection Time    09/17/14  5:25 AM      Result Value Ref Range    Glucose-Capillary 141 (*) 70 - 99 mg/dL   Comment 1 Documented in Chart     Comment 2 Notify RN    GLUCOSE, CAPILLARY     Status: Abnormal   Collection Time    09/17/14 11:24 AM      Result Value Ref Range   Glucose-Capillary 185 (*) 70 - 99 mg/dL   Comment 1 Notify RN     Comment 2 Documented in Chart     Dg Chest Port 1 View  09/17/2014   CLINICAL DATA:  Congestive heart failure  EXAM: PORTABLE CHEST - 1 VIEW  COMPARISON:  09/14/2014  FINDINGS: Cardiomegaly again noted. Central vascular congestion without convincing pulmonary edema. Persistent small bilateral pleural effusion with bilateral basilar atelectasis or infiltrate. Status post median sternotomy. Stable NG tube and left IJ central line position.  IMPRESSION: Central vascular congestion without convincing pulmonary edema. Persistent small bilateral pleural effusion with bilateral basilar atelectasis or infiltrate. Status post median sternotomy. Stable NG tube and left IJ central line position.   Electronically Signed   By: Lahoma Crocker M.D.   On: 09/17/2014 09:25       Assessment/Plan 1. Hemorrhagic CVA secondary to coumadin 2. Dysphagia Patient Active Problem List   Diagnosis Date Noted  . Acute diastolic heart failure 75/03/1070  . Wide-complex tachycardia 09/16/2014  . Intracerebral hemorrhage 09/09/2014  . UTI (urinary tract infection) 09/02/2014  . Diabetes mellitus with diabetic polyneuropathy   . A-fib   . Hypertension   . Acute encephalopathy 09/01/2014   Plan: 1. Will tentatively plan on PEG on Friday at 10 in endo with anesthesia.  Her daughter is supposed to arrive tonight.  We will discuss this with her tomorrow to make sure she is agreeable.    Derren Suydam E 09/17/2014, 2:30 PM Pager: [phone]

## 2014-09-17 NOTE — Consult Note (Signed)
PEG on Friday with anesthesia Marta LamasJames O. Gae BonWyatt, III, MD, FACS [phone] [phone]--office [facility]

## 2014-09-18 LAB — BASIC METABOLIC PANEL
Anion gap: 14 (ref 5–15)
BUN: 30 mg/dL — AB (ref 6–23)
CALCIUM: 8.1 mg/dL — AB (ref 8.4–10.5)
CO2: 23 meq/L (ref 19–32)
Chloride: 105 mEq/L (ref 96–112)
Creatinine, Ser: 1.01 mg/dL (ref 0.50–1.10)
GFR calc Af Amer: 58 mL/min — ABNORMAL LOW (ref >90)
GFR calc non Af Amer: 50 mL/min — ABNORMAL LOW (ref >90)
GLUCOSE: 129 mg/dL — AB (ref 70–99)
Potassium: 4.2 mEq/L (ref 3.7–5.3)
SODIUM: 142 meq/L (ref 137–147)

## 2014-09-18 LAB — CBC
HEMATOCRIT: 28 % — AB (ref 36.0–46.0)
HEMOGLOBIN: 8.9 g/dL — AB (ref 12.0–15.0)
MCH: 30.2 pg (ref 26.0–34.0)
MCHC: 31.8 g/dL (ref 30.0–36.0)
MCV: 94.9 fL (ref 78.0–100.0)
Platelets: 266 10*3/uL (ref 150–400)
RBC: 2.95 MIL/uL — AB (ref 3.87–5.11)
RDW: 17.7 % — ABNORMAL HIGH (ref 11.5–15.5)
WBC: 11.1 10*3/uL — ABNORMAL HIGH (ref 4.0–10.5)

## 2014-09-18 LAB — GLUCOSE, CAPILLARY
GLUCOSE-CAPILLARY: 193 mg/dL — AB (ref 70–99)
Glucose-Capillary: 120 mg/dL — ABNORMAL HIGH (ref 70–99)
Glucose-Capillary: 157 mg/dL — ABNORMAL HIGH (ref 70–99)

## 2014-09-18 NOTE — Progress Notes (Signed)
ANTIBIOTIC CONSULT NOTE - FOLLOW UP  Pharmacy Consult:  Vancomycin / Zosyn (D#7) Indication:  HCAP/fever  Allergies  Allergen Reactions  . Septra [Sulfamethoxazole-Trimethoprim] Hives    Patient Measurements: Height: 4\' 11"  (149.9 cm) Weight: 146 lb 2.6 oz (66.3 kg) IBW/kg (Calculated) : 43.2  Vital Signs: Temp: 99 F (37.2 C) (10/29 0900) Temp Source: Axillary (10/29 0900) BP: 152/65 mmHg (10/29 0900) Pulse Rate: 108 (10/29 0900) Intake/Output from previous day: 10/28 0701 - 10/29 0700 In: 760 [I.V.:270; NG/GT:465; IV Piggyback:25] Out: 3050 [Urine:3050] Intake/Output from this shift: Total I/O In: -  Out: 250 [Urine:250]  Labs:  Recent Labs  09/16/14 0400 09/17/14 0458 09/18/14 0536  WBC 11.0* 10.2 11.1*  HGB 8.4* 8.3* 8.9*  PLT 246 245 266  CREATININE 1.15* 1.20* 1.01   Estimated Creatinine Clearance: 34.3 ml/min (by C-G formula based on Cr of 1.01).  Recent Labs  09/15/14 2240  VANCOTROUGH 12.8     Microbiology: Recent Results (from the past 720 hour(s))  URINE CULTURE     Status: None   Collection Time    09/01/14  8:52 PM      Result Value Ref Range Status   Specimen Description URINE, RANDOM   Final   Special Requests NONE   Final   Culture  Setup Time     Final   Value: 09/02/2014 05:33     Performed at [facility]   Colony Count     Final   Value: NO GROWTH     [facility]formed at [facility]   Culture     Final   Value: NO GROWTH     Performed at [facility]   Report Status 09/03/2014 FINAL   Final  CULTURE, BLOOD (ROUTINE X 2)     Status: None   Collection Time    09/01/14  9:18 PM      Result Value Ref Range Status   Specimen Description BLOOD RIGHT ARM   Final   Special Requests BOTTLES DRAWN AEROBIC AND ANAEROBIC 4CC   Final   Culture  Setup Time     Final   Value: 09/02/2014 04:13     Performed at [facility]   Culture     Final   Value: NO GROWTH 5 DAYS     Performed at [facility]   Report Status 09/08/2014 FINAL   Final  CULTURE, BLOOD (ROUTINE X 2)     Status: None   Collection Time    09/01/14  9:45 PM      Result Value Ref Range Status   Specimen Description BLOOD RIGHT HAND   Final   Special Requests BOTTLES DRAWN AEROBIC ONLY 8CC   Final   Culture  Setup Time     Final   Value: 09/02/2014 04:12     Performed at [facility]   Culture     Final   Value: NO GROWTH 5 DAYS     Performed at [facility]   Report Status 09/08/2014 FINAL   Final  MRSA PCR SCREENING     Status: None   Collection Time    09/03/14  4:41 PM      Result Value Ref Range Status   MRSA by PCR NEGATIVE  NEGATIVE Final   Comment:            The GeneXpert MRSA Assay (FDA     approved for NASAL specimens     only), is one component of  a     comprehensive MRSA colonization     surveillance program. It is not     intended to diagnose MRSA     infection nor to guide or     monitor treatment for     MRSA infections.  CSF CULTURE     Status: None   Collection Time    09/03/14  5:40 PM      Result Value Ref Range Status   Specimen Description CSF   Final   Special Requests NONE   Final   Gram Stain     Final   Value: RARE WBC PRESENT,BOTH PMN AND MONONUCLEAR     NO ORGANISMS SEEN     [facility]     Performed at [facility]   Culture     Final   Value: NO GROWTH 3 DAYS     Performed at [facility]   Report Status 09/07/2014 FINAL   Final  GRAM STAIN     Status: None   Collection Time    09/03/14  5:40 PM      Result Value Ref Range Status   Specimen Description CSF   Final   Special Requests NONE   Final   Gram Stain     Final   Value: RARE WBC PRESENT,BOTH PMN AND MONONUCLEAR     NO ORGANISMS SEEN   Report Status 09/04/2014 FINAL   Final  CLOSTRIDIUM DIFFICILE BY PCR     Status: None   Collection Time    09/06/14 12:00 PM      Result Value Ref Range Status   C difficile by pcr NEGATIVE  NEGATIVE Final  CSF CULTURE      Status: None   Collection Time    09/11/14 10:08 AM      Result Value Ref Range Status   Specimen Description CSF   Final   Special Requests NO 3 CUP   Final   Gram Stain     Final   Value: WBC PRESENT,BOTH PMN AND MONONUCLEAR     NO ORGANISMS SEEN     CYTOSPIN [facility]     Performed at [facility]   Culture     Final   Value: NO GROWTH 3 DAYS     Performed at [facility]   Report Status 09/14/2014 FINAL   Final  GRAM STAIN     Status: None   Collection Time    09/11/14 10:08 AM      Result Value Ref Range Status   Specimen Description CSF   Final   Special Requests NO 3 CUP   Final   Gram Stain     Final   Value: cytospin slide     WBC PRESENT,BOTH PMN AND MONONUCLEAR     NO ORGANISMS SEEN   Report Status 09/11/2014 FINAL   Final  URINE CULTURE     Status: None   Collection Time    09/12/14 10:59 PM      Result Value Ref Range Status   Specimen Description URINE, CATHETERIZED   Final   Special Requests NONE   Final   Culture  Setup Time     Final   Value: 09/13/2014 05:17     Performed at [facility]   Colony Count     Final   Value: >=100,000 COLONIES/ML     [facility]formed at [facility]   Culture  Final   Value: YEAST     Performed at [facility]   Report Status 09/15/2014 FINAL   Final  CULTURE, BLOOD (ROUTINE X 2)     Status: None   Collection Time    09/12/14 11:50 PM      Result Value Ref Range Status   Specimen Description BLOOD RIGHT ARM   Final   Special Requests BOTTLES DRAWN AEROBIC ONLY 10CC   Final   Culture  Setup Time     Final   Value: 09/13/2014 03:47     Performed at [facility]   Culture     Final   Value:        BLOOD CULTURE RECEIVED NO GROWTH TO DATE CULTURE WILL BE HELD FOR 5 DAYS BEFORE ISSUING A FINAL NEGATIVE REPORT     Performed at [facility]   Report Status PENDING   Incomplete  CULTURE, BLOOD (ROUTINE X 2)     Status: None   Collection Time     09/12/14 11:59 PM      Result Value Ref Range Status   Specimen Description BLOOD RIGHT HAND   Final   Special Requests BOTTLES DRAWN AEROBIC ONLY 5CC   Final   Culture  Setup Time     Final   Value: 09/13/2014 03:47     Performed at [facility]   Culture     Final   Value:        BLOOD CULTURE RECEIVED NO GROWTH TO DATE CULTURE WILL BE HELD FOR 5 DAYS BEFORE ISSUING A FINAL NEGATIVE REPORT     Performed at [facility]   Report Status PENDING   Incomplete  CLOSTRIDIUM DIFFICILE BY PCR     Status: None   Collection Time    09/17/14  2:42 PM      Result Value Ref Range Status   C difficile by pcr NEGATIVE  NEGATIVE Final      Assessment: 52 YOF admitted 09/01/14 to [facility] with acute encephalopathy and a diagnosis of UTI.  Patient transferred to [facility] 09/03/14 for further management.  She was initially treated for UTI, acute hydrocephalus, and rule out meningitis.  Now to continue on vancomycin and Zosyn for possible HCAP.  Patient's renal function is improving overall.  Vanc 10/23 >> Zosyn 10/23 Levofloxacin 10/12>>10/14 Acyclovir 10/14>>10/15  10/26 VT = 12.8 on 750mg  q24 >> incr 1g q24  10/28 C.diff - negative 10/23 blood - NGTD 10/23 urine - >100,000 yeast (has foley) 10/23 CSF - negative 10/12 blood cx - neg 10/12 urine cx - neg 10/14 CSF cx - neg 10/14 HSV - neg 10/15 HIV - neg   Goal of Therapy:  Vancomycin trough level 15-20 mcg/ml   Plan:  - Continue vanc 1gm IV Q24H - Continue Zosyn 3.375gm IV Q8H, 4 hr infusion - Monitor renal fxn, clinical progress, PRN vanc trough - F/U abx LOT    Sarena Jezek D. Laney Potash, PharmD, BCPS Pager:  [phone] 09/18/2014, 11:30 AM

## 2014-09-18 NOTE — Progress Notes (Signed)
STROKE TEAM PROGRESS NOTE   HISTORY Susan Calhoun is an 80 y.o. female who was noted by family to be acting confused > 2 weeks prior. Patient was brought to the hospital where she was diagnosed with a UTI. She was given Rocephin and sent home on Keflex. Due to not being awake enough to take her medications she was brought back to the hospital. MRI brain showed "layering material in the occipital horns of lateral ventricles, at least partly hemorrhagic. Question focal thrombus in the atrium of the right lateral ventricle, possibly associated with the choroid. This could be the site of hemorrhage". Infectious disease was consulted and they do not feel this is bacterial meningoencephalitis and ABX were D/C'd. Neuro surgery placed a Ventriculostomy drain for HCP. Since drain placement her mentation has not fully resolved. Repeat head CT shows no worsening of HCP or blood products. Neurology was asked to further evaluate. At time of evaluation, patient intubated on the vent, Temp 100.8, WBC 12.2. CSF: WBC 168, red blood cells 70000, lymph 7, neutrophile 85, glucose 78, protein 139   SUBJECTIVE (INTERVAL HISTORY) Daughter at bedside. Eyes open on voice but not following commands. Still has PANDA tube and daughter declined PEG today and would like speech re-eval. Will order speech eval and hold off PEG till next week.   OBJECTIVE Temp:  [97.9 F (36.6 C)-99 F (37.2 C)] 99 F (37.2 C) (10/29 0900) Pulse Rate:  [73-113] 108 (10/29 0900) Cardiac Rhythm:  [-] Atrial fibrillation (10/28 1700) Resp:  [18-32] 20 (10/29 0900) BP: (137-177)/(44-77) 152/65 mmHg (10/29 0900) SpO2:  [96 %-100 %] 96 % (10/29 0900)   Recent Labs Lab 09/17/14 0525 09/17/14 1124 09/17/14 1719 09/18/14 0045 09/18/14 0515  GLUCAP 141* 185* 122* 157* 120*    Recent Labs Lab 09/14/14 0815 09/15/14 0440 09/16/14 0400 09/16/14 0850 09/17/14 0458 09/18/14 0536  NA 136* 139 139  --  141 142  K 3.8 4.0 3.6*  --  4.4 4.2   CL 100 103 104  --  106 105  CO2 23 22 23   --  23 23  GLUCOSE 137* 124* 147*  --  145* 129*  BUN 42* 45* 39*  --  35* 30*  CREATININE 1.25* 1.34* 1.15*  --  1.20* 1.01  CALCIUM 7.6* 7.6* 7.9*  --  7.9* 8.1*  MG  --   --   --  2.0  --   --   PHOS  --   --   --  3.6  --   --    No results found for this basename: AST, ALT, ALKPHOS, BILITOT, PROT, ALBUMIN,  in the last 168 hours  Recent Labs Lab 09/14/14 0815 09/15/14 0440 09/16/14 0400 09/17/14 0458 09/18/14 0536  WBC 11.9* 11.2* 11.0* 10.2 11.1*  HGB 8.5* 8.1* 8.4* 8.3* 8.9*  HCT 26.2* 24.9* 26.1* 25.9* 28.0*  MCV 90.3 91.5 91.3 92.2 94.9  PLT 248 244 246 245 266    Recent Labs Lab 09/16/14 0850 09/16/14 1430  TROPONINI 0.30* <0.30   No results found for this basename: LABPROT, INR,  in the last 72 hours No results found for this basename: COLORURINE, APPERANCEUR, LABSPEC, PHURINE, GLUCOSEU, HGBUR, BILIRUBINUR, KETONESUR, PROTEINUR, UROBILINOGEN, NITRITE, LEUKOCYTESUR,  in the last 72 hours     Component Value Date/Time   CHOL 204* 09/02/2014 0530   TRIG 149 09/02/2014 0530   HDL 64 09/02/2014 0530   CHOLHDL 3.2 09/02/2014 0530   VLDL 30 09/02/2014 0530   LDLCALC 110*  09/02/2014 0530   Lab Results  Component Value Date   HGBA1C 5.6 09/01/2014   No results found for this basename: labopia,  cocainscrnur,  labbenz,  amphetmu,  thcu,  labbarb    No results found for this basename: ETH,  in the last 168 hours  I have personally reviewed the radiological images below and agree with the radiology interpretations.  Ct Head Wo Contrast 09/14/14-  1. Stable appearance of hydrocephalus involving the lateral and third ventricles with some residual layering blood products.  2. Persisting gas within the frontal horn of the right lateral ventricle following removal of the ventriculostomy catheter.  3. Stable mild diffuse white matter disease without evidence for acute infarct or significant interval change.  4. Stable  focal hemorrhage within the left cerebral peduncle. 09/11/14 - Normal ventricular enlargement is stable. Intraventricular hemorrhage slightly improved. Small area of hemorrhage or calcification in the right parietal lobe is stable.  09/08/2014  1. Unchanged dilatation of the third ventricle and likely no significant interval change in dilatation of the lateral ventricles. 2. Unchanged intraventricular blood. 3. Small volume of subarachnoid blood.    09/05/2014   1. Interval placement of right frontal approach ventriculostomy catheter with tip in the third ventricle. Ventricles remain dilated but are slightly decreased in size relative to previous MRI from 09/03/2014. 2. Layering hemorrhage within the occipital horns of the lateral ventricles bilaterally, stable from prior. 3. Question trace acute subarachnoid hemorrhage within the right sylvian fissure.     08/31/2014  Mild, likely age-appropriate, centralized volume loss without acute intracranial process.     [name] Contrast 09/12/14 Diffuse ventricular enlargement unchanged from CT scan yesterday. Findings suggest communicating hydrocephalus. Interval removal ventricular catheter with gas now in the right frontal horn. Intraventricular hemorrhage and subarachnoid hemorrhage appears  stable. Negative for acute ischemic infarct. 09/03/2014    Acute hydrocephalus, worsened since 3 days ago. Layering material in the occipital horns of lateral ventricles, at least partly hemorrhagic. Question focal thrombus in the atrium of the right lateral ventricle, possibly associated with the choroid. This could be the site of hemorrhage. No specific vascular lesion is identified. No intraparenchymal hemorrhage. The differential diagnosis does include meningitis.    Dg Chest Port 1 View 09/06/2014   Feeding tube with weighted tip in stomach.   09/04/2014    New left jugular central venous catheter tip overlies the distal SVC. No evidence of pneumothorax.   Stable cardiomegaly.  No active lung disease.     Dg Chest 2 View 08/31/2014    Borderline cardiomegaly and lung hyperexpansion without acute cardiopulmonary disease.    EEG 09/11/14 : This is a markedly abnormal electroencephalogram secondary to general background slowing with frequent intermittent discharges of triphasic morphology. Although this may be seen with a severe metabolic encephalopathy can not rule out the possibility that this may represent epileptic phenomenon. Clinical correlation recommended.   2D echo 09/17/14 - - Left ventricle: The cavity size was normal. Wall thickness was normal. Systolic function was normal. The estimated ejection fraction was in the range of 50% to 55%. Wall motion was normal; there were no regional wall motion abnormalities. - Mitral valve: Calcified annulus. There was moderate regurgitation. - Left atrium: The atrium was moderately dilated. - Pulmonary arteries: PA peak pressure: 35 mm Hg (S).   PHYSICAL EXAM Frail elderly Caucasian lady not in distress. Afebrile. Head is nontraumatic. Neck is supple without bruit.  Cardiac exam no murmur or gallop, but tachycardia. Lungs are  clear to auscultation. Distal pulses are well felt.  Neurological Exam: briefly open eyes on voice, not following commands. Pupils exam showed left surgical pupil, left sluggish to light. Slight dysconjugate gaze with left eye hypotropia. Roving eye movements present. Does not blink to threat on either side. Fundi could not be visualized. Face is symmetric. Tongue is midline. BLE withdraw to pain but not against gravity, LUE flaccid 0/5 on pain stimulation. RUE extension on pain stimulation and rigidity with cog-wheeling. Left plantar upgoing right equivocal. Sensation and coordination cannot be reliably tested. Gait was not tested.  ASSESSMENT/PLAN Ms. Lemma Tetro is a 80 y.o. female with history of altered mental status following UTI, seen in the ED 08/31/2014 - admitted  09/01/2014 with confusion. Patient was on coumadin at that time for atrial fibrillation. INR 1.84. Administered Levaquin. BP elevated on the 11th at 166/44, max BP next day 189/112. Pt INR trending up and 2.10 on 09/03/14. In retrospect, initial CT with L IVH. Repeat CT done during workup found to have worsening of IVH with bilateral occipital intraventricular blood with hydrocephalus. Hypertension and coumadin associated coagulapathy etiology of hemorrhage.   Stroke:  Non-dominant IVH secondary to coumadin associated coagulopathy in setting of uncontrolled hypertension.  Intraventricular drain placed with no improvement of mental status.  CSF without source of infection.   EVD removed  SCDs for VTE prophylaxis NPO on TF, will call trauma surgery for PEG placement, hopefully tomorrow.  Bedrest    warfarin prior to admission, no antithrombotics now.  Resultant confusion, unable to follow commands mental status disproportionate to amount of IVH and CSF pressure.  Therapy recommendations: SNF  Disposition:  SNF  AMS - likely due to IVH and hydrocephalus - LTM EEG did not detect seizure activity  - d/c vimpat and dilantin - Dr. Cyril Mourning help appreciated - continue amantadine 200mg  bid for arousal.   Atrial Fibrillation  Home meds:  Warfarin prior to admission  INR 1.51 on admission 10/12. High as 2.10 on 09/03/14.   No longer an anticoagulation candidate secondary to hemorrhage  Had wide complex tachycardia, cardiology consulted  Consider Afib with aberrancy/rate related bundle branch block.  Continue metoprolol 50mg  bid and cardiazem 30mg  q6   Add lasix 40mg  daily  Continue on amiodarone  Repeat 2D echo unremarkable  High BNP but normal CEs  Cardiology recs appreciated.  On tele  Dysphagia - NPO now - PANDA tube for tube feeding - PEG declined by daughter and reconsider next Monday - trauma surgery on board - swallow reevaluation by  speech  Hypertension  Stable, on the higher side  BP goal < 160  On metoprolol, increase from 25mg  bid to 50mg  bid  Leukocytosis  - WBC down trending 13.2->11.9 ->11.0 -> 10.2 ->11.1 - culture sent so far negative - on vancon/zosyn, d/c tomorrow. - afebrile  Hyperlipidemia  Home meds:  lipitor 80   LDL 110 goal < 70  D/c lipitor due to IVH  Other Stroke Risk Factors Advanced age   Obesity, Body mass index is 29.51 kg/(m^2).    Coronary artery disease - s/p CABG ICA stenosis s/p CEA 2015 PVD  Other Active Problems  UTI with pyelonephritis  CKD   Hospital day # 17  Marvel Plan, MD PhD Stroke Neurology 09/18/2014 9:59 AM     To contact Stroke Continuity provider, please refer to [email or web address]. After hours, contact General Neurology

## 2014-09-18 NOTE — Progress Notes (Signed)
SUBJECTIVE: Pt non-verbal.   BP 137/69  Pulse 113  Temp(Src) 98.8 F (37.1 C) (Axillary)  Resp 18  Ht 4\' 11"  (1.499 m)  Wt 146 lb 2.6 oz (66.3 kg)  BMI 29.51 kg/m2  SpO2 98%  Intake/Output Summary (Last 24 hours) at 09/18/14 0843 Last data filed at 09/18/14 0511  Gross per 24 hour  Intake    685 ml  Output   2850 ml  Net  -2165 ml    PHYSICAL EXAM General: Well developed, well nourished, in no acute distress. She does not respond to questions. Eyes are open Neck: No JVD. No masses noted.  Lungs: Clear bilaterally with no wheezes or rhonci noted.  Heart: Tachy, irregular with no murmurs noted. Abdomen: Bowel sounds are present. Soft, non-tender.  Extremities: No lower extremity edema.   LABS: Basic Metabolic Panel:  Recent Labs  16/08/9609/27/15 0850 09/17/14 0458 09/18/14 0536  NA  --  141 142  K  --  4.4 4.2  CL  --  106 105  CO2  --  23 23  GLUCOSE  --  145* 129*  BUN  --  35* 30*  CREATININE  --  1.20* 1.01  CALCIUM  --  7.9* 8.1*  MG 2.0  --   --   PHOS 3.6  --   --    CBC:  Recent Labs  09/17/14 0458 09/18/14 0536  WBC 10.2 11.1*  HGB 8.3* 8.9*  HCT 25.9* 28.0*  MCV 92.2 94.9  PLT 245 266   Cardiac Enzymes:  Recent Labs  09/16/14 0850 09/16/14 1430  TROPONINI 0.30* <0.30   Current Meds: . amantadine  200 mg Oral BID  . amiodarone  200 mg Per Tube Daily  . antiseptic oral rinse  7 mL Mouth Rinse q12n4p  . chlorhexidine  15 mL Mouth Rinse BID  . diltiazem  30 mg Oral 4 times per day  . furosemide  40 mg Intravenous Daily  . insulin aspart  0-15 Units Subcutaneous Q6H  . latanoprost  1 drop Right Eye QHS  . metoprolol tartrate  50 mg Per Tube BID  . pantoprazole sodium  40 mg Per Tube Daily  . piperacillin-tazobactam (ZOSYN)  IV  3.375 g Intravenous 3 times per day  . potassium chloride  40 mEq Oral Daily  . vancomycin  1,000 mg Intravenous Q24H   Echo 09/17/14: Left ventricle: The cavity size was normal. Wall thickness was normal.  Systolic function was normal. The estimated ejection fraction was in the range of 50% to 55%. Wall motion was normal; there were no regional wall motion abnormalities. - Mitral valve: Calcified annulus. There was moderate regurgitation. - Left atrium: The atrium was moderately dilated. - Pulmonary arteries: PA peak pressure: 35 mm Hg (S).  ASSESSMENT AND PLAN: Susan Calhoun is a 80 y.o. female with a history of CAD s/p CABG, PAF s/p ablation 04/2014, HTN, anemia, glaucoma,and PVD s/p carotid endarterectomy who [facility] with AMS and was found to have acute hydrocephalus with concerns for meningitis on MRI. [facility]. The patient was noted to go back into atrial fib with RVR during her acute illness. There was also some wide complex tachycardia noted and cardiology was consulted on 09/16/14. (Dr. Katrinka BlazingSmith)  #1. Wide complex tachycardia- Likely atrial fibrillation with aberrancy/rate related bundle branch block and not NSVT. She is not on tele so I cannot comment on her rhythm today.   #2.  Atrial fibrillation with RVR: Rate controlled on Lopressor 50mg  BID, diltiazem 30mg  q6hrs and Amiodarone 200mg  qd. Echo with normal LV function. No changes.   #3. Acute on chronic diastolic CHF: Volume status is better. Would change to Lasix 40 mg po Qdaily.   #4. Hydrocephalus- partly hemorrhagic. She is SP right frontal IVC placement. ASA and coumadin discontinued   #5. CAD s/p CABG: Stable.   Her prognosis seems poor from a neuro standpoint. No further cardiac recs at this time. Will sign off. Please call with questions.   Susan Calhoun 09/18/2014 8:55 AM      Susan Calhoun  10/29/20158:43 AM

## 2014-09-18 NOTE — Progress Notes (Signed)
[facility] Progress Note     Subjective: Follows some commands - wiggles her toes.  Daughter at bedside doesn't want PEG tube at this point in time.  She wants SLP to eval her mom to see if she will pass a swallow evaluation.  Objective: Vital signs in last 24 hours: Temp:  [97.9 F (36.6 C)-99 F (37.2 C)] 99 F (37.2 C) (10/29 0900) Pulse Rate:  [73-113] 108 (10/29 0900) Resp:  [18-32] 20 (10/29 0900) BP: (137-177)/(44-77) 152/65 mmHg (10/29 0900) SpO2:  [96 %-100 %] 96 % (10/29 0900) Last BM Date: 09/18/14  Intake/Output from previous day: 10/28 0701 - 10/29 0700 In: 760 [I.V.:270; NG/GT:465; IV Piggyback:25] Out: 3050 [Urine:3050] Intake/Output this shift: Total I/O In: -  Out: 250 [Urine:250]  PE: Gen:  Alert, NAD, pleasant Card:  tachy, no M/G/R heard Pulm:  CTA, no W/R/R Abd: Soft, NT/ND, +BS, no HSM, no scars noted  Lab Results:   Recent Labs  09/17/14 0458 09/18/14 0536  WBC 10.2 11.1*  HGB 8.3* 8.9*  HCT 25.9* 28.0*  PLT 245 266   BMET  Recent Labs  09/17/14 0458 09/18/14 0536  NA 141 142  K 4.4 4.2  CL 106 105  CO2 23 23  GLUCOSE 145* 129*  BUN 35* 30*  CREATININE 1.20* 1.01  CALCIUM 7.9* 8.1*   PT/INR No results found for this basename: LABPROT, INR,  in the last 72 hours CMP     Component Value Date/Time   NA 142 09/18/2014 0536   K 4.2 09/18/2014 0536   CL 105 09/18/2014 0536   CO2 23 09/18/2014 0536   GLUCOSE 129* 09/18/2014 0536   BUN 30* 09/18/2014 0536   CREATININE 1.01 09/18/2014 0536   CALCIUM 8.1* 09/18/2014 0536   PROT 6.8 09/02/2014 0530   ALBUMIN 2.9* 09/02/2014 0530   AST 19 09/02/2014 0530   ALT 15 09/02/2014 0530   ALKPHOS 71 09/02/2014 0530   BILITOT 0.4 09/02/2014 0530   GFRNONAA 50* 09/18/2014 0536   GFRAA 58* 09/18/2014 0536   Lipase  No results found for this basename: lipase       Studies/Results: Dg Chest Port 1 View  09/17/2014   CLINICAL DATA:  Congestive heart failure  EXAM:  PORTABLE CHEST - 1 VIEW  COMPARISON:  09/14/2014  FINDINGS: Cardiomegaly again noted. Central vascular congestion without convincing pulmonary edema. Persistent small bilateral pleural effusion with bilateral basilar atelectasis or infiltrate. Status post median sternotomy. Stable NG tube and left IJ central line position.  IMPRESSION: Central vascular congestion without convincing pulmonary edema. Persistent small bilateral pleural effusion with bilateral basilar atelectasis or infiltrate. Status post median sternotomy. Stable NG tube and left IJ central line position.   Electronically Signed   By: Natasha MeadLiviu  Pop M.D.   On: 09/17/2014 09:25    Anti-infectives: Anti-infectives   Start     Dose/Rate Route Frequency Ordered Stop   09/16/14 0000  vancomycin (VANCOCIN) IVPB 1000 mg/200 mL premix     1,000 mg 200 mL/hr over 60 Minutes Intravenous Every 24 hours 09/15/14 2345     09/13/14 0600  piperacillin-tazobactam (ZOSYN) IVPB 3.375 g     3.375 g 12.5 mL/hr over 240 Minutes Intravenous 3 times per day 09/12/14 2248     09/12/14 2315  vancomycin (VANCOCIN) IVPB 750 mg/150 ml premix  Status:  Discontinued     750 mg 150 mL/hr over 60 Minutes Intravenous Every 24 hours 09/12/14 2310 09/15/14 2344   09/12/14 2300  piperacillin-tazobactam (  ZOSYN) IVPB 3.375 g     3.375 g 100 mL/hr over 30 Minutes Intravenous  Once 09/12/14 2254 09/13/14 0002   09/04/14 2200  acyclovir (ZOVIRAX) 500 mg in dextrose 5 % 100 mL IVPB  Status:  Discontinued     500 mg 110 mL/hr over 60 Minutes Intravenous Every 12 hours 09/04/14 1345 09/04/14 1543   09/04/14 1600  vancomycin (VANCOCIN) IVPB 750 mg/150 ml premix  Status:  Discontinued     750 mg 150 mL/hr over 60 Minutes Intravenous Every 24 hours 09/03/14 1518 09/03/14 1537   09/04/14 0200  cefTRIAXone (ROCEPHIN) 2 g in dextrose 5 % 50 mL IVPB  Status:  Discontinued     2 g 100 mL/hr over 30 Minutes Intravenous Every 12 hours 09/03/14 1431 09/03/14 1537   09/03/14 2200   acyclovir (ZOVIRAX) 430 mg in dextrose 5 % 100 mL IVPB  Status:  Discontinued     430 mg 108.6 mL/hr over 60 Minutes Intravenous Every 12 hours 09/03/14 1011 09/04/14 1345   09/03/14 1800  ampicillin (OMNIPEN) 1 g in sodium chloride 0.9 % 50 mL IVPB  Status:  Discontinued     1 g 150 mL/hr over 20 Minutes Intravenous 4 times per day 09/03/14 1519 09/03/14 1537   09/03/14 1600  vancomycin (VANCOCIN) 1,500 mg in sodium chloride 0.9 % 500 mL IVPB  Status:  Discontinued     1,500 mg 250 mL/hr over 120 Minutes Intravenous  Once 09/03/14 1517 09/03/14 1537   09/03/14 1515  cefTRIAXone (ROCEPHIN) 2 g in dextrose 5 % 50 mL IVPB  Status:  Discontinued     2 g 100 mL/hr over 30 Minutes Intravenous  Once 09/03/14 1430 09/03/14 1537   09/03/14 1100  acyclovir (ZOVIRAX) 430 mg in dextrose 5 % 100 mL IVPB     430 mg 108.6 mL/hr over 60 Minutes Intravenous  Once 09/03/14 1010 09/03/14 1342   09/01/14 2200  levofloxacin (LEVAQUIN) IVPB 750 mg  Status:  Discontinued     750 mg 100 mL/hr over 90 Minutes Intravenous Every 48 hours 09/01/14 2039 09/03/14 1422       Assessment/Plan 1. 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 Dysphagia  3. Inability to take PO 4. Need for feeding tube  Plan:  1. Daughter came back in town last night.  She does not want her mom to have a feeding tube yet.  She would like to see if she continues to improve and see if she will pass a swallow eval.   2. We have a PEG tentatively scheduled on Friday at 10 in endo with anesthesia, but we may have to cancel this.  Please feel free to call us back if the daughter changes her mind. 3.  Would recommend SLP evaluation     LOS: 17 days    DORT, Emelda Kohlbeck 09/18/2014, 9:53 AM Pager: [phone]

## 2014-09-18 NOTE — Clinical Social Work Psychosocial (Signed)
Clinical Social Work Department BRIEF PSYCHOSOCIAL ASSESSMENT 09/18/2014  Patient:  Susan Calhoun, Susan Calhoun     Account Number:  [id number]     Admit date:  09/01/2014  Clinical Social Worker:  Marciano Sequin  Date/Time:  09/18/2014 01:52 PM  Referred by:  RN  Date Referred:  09/18/2014 Referred for  SNF Placement   Other Referral:   Interview type:  Family Other interview type:   Pt is not oriented; Daughter Elba Barman [phone]    PSYCHOSOCIAL DATA Living Status:  ALONE Admitted from facility:   Level of care:   Primary support name:  Tillman,Vivilana Primary support relationship to patient:  CHILD, ADULT Degree of support available:   Strong Support System    CURRENT CONCERNS  Other Concerns:    SOCIAL WORK ASSESSMENT / PLAN CSW met the pt's daughter Marina Gravel at the bedside. CSW introduced self and purpose of visit. Vivilana reported the pt will no return back to [place]. Vivilana reported the pt will lives in [place] with her granddaughter.  CSW and Marina Gravel discussed the geographic location in which the family is interested in transitioning the pt to. Vivilana identified [facility] and one SNF in [place]. CSW and Vivilana reviewed the SNF list. CSW and Marina Gravel discussed the pt's insurance as it relates to SNF placement. CSW explained the SNF process to [facility]. CSW answered all questions in which Vivilana inquired about. CSW provided Vivilana with contact information for further questions. CSW will continue to follow this pt and assist with discharge as needed.   Assessment/plan status:  Psychosocial Support/Ongoing Assessment of Needs Other assessment/ plan:   Information/referral to community resources:    PATIENT'S/FAMILY'S RESPONSE TO PLAN OF CARE: The pt's daughter presented with a positive attitude and outlook. Pt's daughter reported that she may be moving from [place] to provided additional support for the pt. Pt's daughter that she wants the  best for the pt.   [place], MSW, [place]

## 2014-09-18 NOTE — Progress Notes (Signed)
Family wants to hold off until next week.  Will cancel PEG for tomorrow.  Possibly next week if she shows not improvement.  Marta LamasJames O. Gae BonWyatt, III, MD, FACS [phone]--pager [phone]--office [facility]

## 2014-09-18 NOTE — Progress Notes (Addendum)
PT Cancellation Note  Patient Details Name: Susan Calhoun MRN: [id number] DOB: 08/18/1928   Cancelled Treatment:    Reason Eval/Treat Not Completed: Medical issues which prohibited therapy. Patient on bedrest (per MD progress note). Will follow up with PT reassessment as appropriate   [place], Adline PotterJulia Elizabeth 09/18/2014, 1:15 PM

## 2014-09-19 ENCOUNTER — Encounter (HOSPITAL_COMMUNITY)
Admission: EM | Disposition: A | Discharge status: Skilled Nursing Facility | Payer: Self-pay | Source: Home / Self Care | Attending: Neurology

## 2014-09-19 LAB — CULTURE, BLOOD (ROUTINE X 2)
Culture: NO GROWTH
Culture: NO GROWTH

## 2014-09-19 LAB — URINE MICROSCOPIC-ADD ON

## 2014-09-19 LAB — BASIC METABOLIC PANEL
Anion gap: 12 (ref 5–15)
BUN: 28 mg/dL — AB (ref 6–23)
CALCIUM: 8.2 mg/dL — AB (ref 8.4–10.5)
CO2: 25 meq/L (ref 19–32)
Chloride: 106 mEq/L (ref 96–112)
Creatinine, Ser: 1.04 mg/dL (ref 0.50–1.10)
GFR calc Af Amer: 56 mL/min — ABNORMAL LOW (ref >90)
GFR, EST NON AFRICAN AMERICAN: 48 mL/min — AB (ref >90)
GLUCOSE: 89 mg/dL (ref 70–99)
Potassium: 4.2 mEq/L (ref 3.7–5.3)
Sodium: 143 mEq/L (ref 137–147)

## 2014-09-19 LAB — URINALYSIS, ROUTINE W REFLEX MICROSCOPIC
Bilirubin Urine: NEGATIVE
GLUCOSE, UA: NEGATIVE mg/dL
Ketones, ur: NEGATIVE mg/dL
LEUKOCYTES UA: NEGATIVE
Nitrite: NEGATIVE
Protein, ur: 100 mg/dL — AB
Specific Gravity, Urine: 1.012 (ref 1.005–1.030)
Urobilinogen, UA: 0.2 mg/dL (ref 0.0–1.0)
pH: 6 (ref 5.0–8.0)

## 2014-09-19 LAB — GLUCOSE, CAPILLARY
GLUCOSE-CAPILLARY: 191 mg/dL — AB (ref 70–99)
GLUCOSE-CAPILLARY: 128 mg/dL — AB (ref 70–99)
Glucose-Capillary: 150 mg/dL — ABNORMAL HIGH (ref 70–99)
Glucose-Capillary: 158 mg/dL — ABNORMAL HIGH (ref 70–99)
Glucose-Capillary: 197 mg/dL — ABNORMAL HIGH (ref 70–99)

## 2014-09-19 LAB — CBC
HCT: 27.7 % — ABNORMAL LOW (ref 36.0–46.0)
HEMOGLOBIN: 8.8 g/dL — AB (ref 12.0–15.0)
MCH: 29.7 pg (ref 26.0–34.0)
MCHC: 31.8 g/dL (ref 30.0–36.0)
MCV: 93.6 fL (ref 78.0–100.0)
Platelets: 266 10*3/uL (ref 150–400)
RBC: 2.96 MIL/uL — AB (ref 3.87–5.11)
RDW: 17.9 % — ABNORMAL HIGH (ref 11.5–15.5)
WBC: 12.6 10*3/uL — ABNORMAL HIGH (ref 4.0–10.5)

## 2014-09-19 SURGERY — EGD (ESOPHAGOGASTRODUODENOSCOPY)
Anesthesia: Monitor Anesthesia Care

## 2014-09-19 MED ORDER — METOPROLOL TARTRATE 25 MG/10 ML ORAL SUSPENSION
75.0000 mg | Freq: Two times a day (BID) | ORAL | Status: DC
  Administered 2014-09-19 – 2014-09-27 (×16): 75 mg
  Filled 2014-09-19 (×20): qty 30

## 2014-09-19 NOTE — Evaluation (Signed)
Clinical/Bedside Swallow Evaluation Patient Details  Name: Susan Calhoun MRN: [id number] Date of Birth: 07/08/1928  Today's Date: 09/19/2014 Time: 1205-1230 SLP Time Calculation (min): 25 min  Past Medical History:  Past Medical History  Diagnosis Date  . Diabetes mellitus with diabetic polyneuropathy   . A-fib   . Renal disorder   . Hypertension   . Anemia   . Glaucoma     right eye   . Carotid artery stenosis   . PVD (peripheral vascular disease)    Past Surgical History:  Past Surgical History  Procedure Laterality Date  . Replacement total knee Right   . Carotid endarterectomy  2015  . Coronary artery bypass graft    . Ablation      cardiac for arrthymia    HPI:  Susan Calhoun is a 80 y.o. female with history of altered mental status following UTI, seen in the ED 08/31/2014 - admitted 09/01/2014 with confusion. Initial CT with L IVH. Repeat CT done during workup found to have worsening of IVH with bilateral occipital intraventricular blood with hydrocephalus. Hypertension and coumadin associated coagulapathy etiology of hemorrhage. Neuro surgery placed a Ventriculostomy drain for HCP. Since drain placement her mentation has not fully resolved. Repeat head CT shows no worsening of HCP or blood products. Questionable history of ventilation from 10/15 until? MD recommended PEG tube, family refused. Pt evaluated by SLP on 09/02/14, recommended to remain NPO due to cognitive deficits, pts arousal declined and SLP signed off.    Assessment / Plan / Recommendation Clinical Impression  Pt demonstrates impaired cognition impacting pts awareness of PO and automaticity with PO. Pts lips remain tightly sealed despite max contextual and self feeding cues from SLP. SLP was able to introduce 3 ice chips to oral cavity with pt orally manipulating and swallowing without evidence of aspiration. Pt likely will be able to swallow PO when cognition improves though for now alternate  method of nutrition is necessary. SLP will continue to follow for PO readiness. Family present and verbalized understanding.     Aspiration Risk  Moderate    Diet Recommendation NPO;Alternative means - temporary        Other  Recommendations Oral Care Recommendations: Oral care Q4 per protocol   Follow Up Recommendations  Skilled Nursing facility    Frequency and Duration min 3x week  2 weeks   Pertinent Vitals/Pain NA    SLP Swallow Goals     Swallow Study Prior Functional Status       General HPI: Susan Calhoun is a 80 y.o. female with history of altered mental status following UTI, seen in the ED 08/31/2014 - admitted 09/01/2014 with confusion. Initial CT with L IVH. Repeat CT done during workup found to have worsening of IVH with bilateral occipital intraventricular blood with hydrocephalus. Hypertension and coumadin associated coagulapathy etiology of hemorrhage. Neuro surgery placed a Ventriculostomy drain for HCP. Since drain placement her mentation has not fully resolved. Repeat head CT shows no worsening of HCP or blood products. Questionable history of ventilation from 10/15 until? MD recommended PEG tube, family refused. Pt evaluated by SLP on 09/02/14, recommended to remain NPO due to cognitive deficits, pts arousal declined and SLP signed off.  Type of Study: Bedside swallow evaluation Previous Swallow Assessment: see HPI Diet Prior to this Study: NPO;Panda Temperature Spikes Noted: No Respiratory Status: Room air History of Recent Intubation: Yes Length of Intubations (days):  (unclear from history) Behavior/Cognition: Alert;Requires cueing;Doesn't follow directions;Decreased sustained attention;Confused Oral Cavity -  Dentition: Missing dentition Self-Feeding Abilities: Total assist Patient Positioning: Upright in bed Baseline Vocal Quality: Low vocal intensity (one attempt to verbalize) Volitional Cough: Cognitively unable to elicit Volitional Swallow:  Unable to elicit    Oral/Motor/Sensory Function Overall Oral Motor/Sensory Function:  (does not follow oral motor commands, no foal weakness)   Ice Chips Ice chips: Impaired Oral Phase Impairments: Other (comment);Poor awareness of bolus (SLP forced ice chip between sealed lips)   Thin Liquid Thin Liquid: Impaired Presentation: Cup;Straw Oral Phase Impairments: Poor awareness of bolus (tight labial seal, minimal trials with straw) Oral Phase Functional Implications: Prolonged oral transit    Nectar Thick Nectar Thick Liquid: Not tested   Honey Thick Honey Thick Liquid: Not tested   Puree Puree: Not tested   Solid   GO    Solid: Not tested      Harlon DittyBonnie Kennetta Pavlovic, MA CCC-SLP [phone]  Kimisha Eunice, Riley NearingBonnie Caroline 09/19/2014,1:26 PM

## 2014-09-19 NOTE — Progress Notes (Signed)
STROKE TEAM PROGRESS NOTE   HISTORY Susan Calhoun is an 80 y.o. female who was noted by family to be acting confused > 2 weeks prior. Patient was brought to the hospital where she was diagnosed with a UTI. She was given Rocephin and sent home on Keflex. Due to not being awake enough to take her medications she was brought back to the hospital. MRI brain showed "layering material in the occipital horns of lateral ventricles, at least partly hemorrhagic. Question focal thrombus in the atrium of the right lateral ventricle, possibly associated with the choroid. This could be the site of hemorrhage". Infectious disease was consulted and they do not feel this is bacterial meningoencephalitis and ABX were D/C'd. Neuro surgery placed a Ventriculostomy drain for HCP. Since drain placement her mentation has not fully resolved. Repeat head CT shows no worsening of HCP or blood products. Neurology was asked to further evaluate. At time of evaluation, patient intubated on the vent, Temp 100.8, WBC 12.2. CSF: WBC 168, red blood cells 70000, lymph 7, neutrophile 85, glucose 78, protein 139    SUBJECTIVE (INTERVAL HISTORY) No family members present. The patient's eyes are open and she appears somewhat more alert.  NG tube is in place. Now on telemetry. Speech therapy reevaluated the patient swallowing. She is to remain NPO. Plan for PEG on Monday.   OBJECTIVE Temp:  [97.5 F (36.4 C)-99.7 F (37.6 C)] 97.5 F (36.4 C) (10/30 0519) Pulse Rate:  [90-120] 105 (10/30 0519) Cardiac Rhythm:  [-] Atrial fibrillation (10/29 2208) Resp:  [18-20] 20 (10/30 0519) BP: (152-171)/(65-99) 165/79 mmHg (10/30 0519) SpO2:  [96 %-100 %] 98 % (10/30 0519)   Recent Labs Lab 09/18/14 0045 09/18/14 0515 09/18/14 1158 09/19/14 0014 09/19/14 0542  GLUCAP 157* 120* 193* 150* 158*    Recent Labs Lab 09/14/14 0815 09/15/14 0440 09/16/14 0400 09/16/14 0850 09/17/14 0458 09/18/14 0536  NA 136* 139 139  --  141 142   K 3.8 4.0 3.6*  --  4.4 4.2  CL 100 103 104  --  106 105  CO2 23 22 23   --  23 23  GLUCOSE 137* 124* 147*  --  145* 129*  BUN 42* 45* 39*  --  35* 30*  CREATININE 1.25* 1.34* 1.15*  --  1.20* 1.01  CALCIUM 7.6* 7.6* 7.9*  --  7.9* 8.1*  MG  --   --   --  2.0  --   --   PHOS  --   --   --  3.6  --   --    No results found for this basename: AST, ALT, ALKPHOS, BILITOT, PROT, ALBUMIN,  in the last 168 hours  Recent Labs Lab 09/15/14 0440 09/16/14 0400 09/17/14 0458 09/18/14 0536 09/19/14 0758  WBC 11.2* 11.0* 10.2 11.1* 12.6*  HGB 8.1* 8.4* 8.3* 8.9* 8.8*  HCT 24.9* 26.1* 25.9* 28.0* 27.7*  MCV 91.5 91.3 92.2 94.9 93.6  PLT 244 246 245 266 266    Recent Labs Lab 09/16/14 0850 09/16/14 1430  TROPONINI 0.30* <0.30   No results found for this basename: LABPROT, INR,  in the last 72 hours No results found for this basename: COLORURINE, APPERANCEUR, LABSPEC, PHURINE, GLUCOSEU, HGBUR, BILIRUBINUR, KETONESUR, PROTEINUR, UROBILINOGEN, NITRITE, LEUKOCYTESUR,  in the last 72 hours     Component Value Date/Time   CHOL 204* 09/02/2014 0530   TRIG 149 09/02/2014 0530   HDL 64 09/02/2014 0530   CHOLHDL 3.2 09/02/2014 0530   VLDL 30 09/02/2014  0530   LDLCALC 110* 09/02/2014 0530   Lab Results  Component Value Date   HGBA1C 5.6 09/01/2014   No results found for this basename: labopia,  cocainscrnur,  labbenz,  amphetmu,  thcu,  labbarb    No results found for this basename: ETH,  in the last 168 hours  I have personally reviewed the radiological images below and agree with the radiology interpretations.  Ct Head Wo Contrast 09/14/14-  1. Stable appearance of hydrocephalus involving the lateral and third ventricles with some residual layering blood products.  2. Persisting gas within the frontal horn of the right lateral ventricle following removal of the ventriculostomy catheter.  3. Stable mild diffuse white matter disease without evidence for acute infarct or significant  interval change.  4. Stable focal hemorrhage within the left cerebral peduncle.  09/11/14 - Normal ventricular enlargement is stable. Intraventricular hemorrhage slightly improved. Small area of hemorrhage or calcification in the right parietal lobe is stable.  09/08/2014  1. Unchanged dilatation of the third ventricle and likely no significant interval change in dilatation of the lateral ventricles. 2. Unchanged intraventricular blood. 3. Small volume of subarachnoid blood.    09/05/2014   1. Interval placement of right frontal approach ventriculostomy catheter with tip in the third ventricle. Ventricles remain dilated but are slightly decreased in size relative to previous MRI from 09/03/2014. 2. Layering hemorrhage within the occipital horns of the lateral ventricles bilaterally, stable from prior. 3. Question trace acute subarachnoid hemorrhage within the right sylvian fissure.     08/31/2014  Mild, likely age-appropriate, centralized volume loss without acute intracranial process.     [name] Contrast 09/12/14 Diffuse ventricular enlargement unchanged from CT scan yesterday. Findings suggest communicating hydrocephalus. Interval removal ventricular catheter with gas now in the right frontal horn. Intraventricular hemorrhage and subarachnoid hemorrhage appears stable. Negative for acute ischemic infarct.  09/03/2014    Acute hydrocephalus, worsened since 3 days ago. Layering material in the occipital horns of lateral ventricles, at least partly hemorrhagic. Question focal thrombus in the atrium of the right lateral ventricle, possibly associated with the choroid. This could be the site of hemorrhage. No specific vascular lesion is identified. No intraparenchymal hemorrhage. The differential diagnosis does include meningitis.    Dg Chest Port 1 View 09/06/2014   Feeding tube with weighted tip in stomach.   09/04/2014    New left jugular central venous catheter tip overlies the distal SVC. No  evidence of pneumothorax.  Stable cardiomegaly.  No active lung disease.     Dg Chest 2 View 08/31/2014    Borderline cardiomegaly and lung hyperexpansion without acute cardiopulmonary disease.    EEG 09/11/14 : This is a markedly abnormal electroencephalogram secondary to general background slowing with frequent intermittent discharges of triphasic morphology. Although this may be seen with a severe metabolic encephalopathy can not rule out the possibility that this may represent epileptic phenomenon. Clinical correlation recommended.   2D echo 09/17/14 - - Left ventricle: The cavity size was normal. Wall thickness was normal. Systolic function was normal. The estimated ejection fraction was in the range of 50% to 55%. Wall motion was normal; there were no regional wall motion abnormalities. - Mitral valve: Calcified annulus. There was moderate regurgitation. - Left atrium: The atrium was moderately dilated. - Pulmonary arteries: PA peak pressure: 35 mm Hg (S).   PHYSICAL EXAM Frail elderly Caucasian lady not in distress. Afebrile. Head is nontraumatic. Neck is supple without bruit.  Cardiac exam no murmur  or gallop, but tachycardia. Lungs are clear to auscultation. Distal pulses are well felt.  Neurological Exam: open eyes on voice, not following commands. Pupils exam showed left surgical pupil, left sluggish to light. Slight dysconjugate gaze with left eye hypotropia. Roving eye movements present. Does not blink to threat on either side. Fundi could not be visualized. Face is symmetric. Tongue is midline. BLE withdraw to pain but not against gravity, LUE flaccid 0/5 on pain stimulation. RUE extension on pain stimulation and rigidity with cog-wheeling. Left plantar upgoing right equivocal. Sensation and coordination cannot be reliably tested. Gait was not tested.  ASSESSMENT/PLAN [name] is a 80 y.o. female with history of altered mental status following UTI, seen in the ED  08/31/2014 - admitted 09/01/2014 with confusion. Patient was on coumadin at that time for atrial fibrillation. INR 1.84. Administered Levaquin. BP elevated on the 11th at 166/44, max BP next day 189/112. Pt INR trending up and 2.10 on 09/03/14. In retrospect, initial CT with L IVH. Repeat CT done during workup found to have worsening of IVH with bilateral occipital intraventricular blood with hydrocephalus. Hypertension and coumadin associated coagulapathy etiology of hemorrhage.   Stroke:  Non-dominant IVH secondary to coumadin associated coagulopathy in setting of uncontrolled hypertension.  Intraventricular drain placed with no improvement of mental status.  CSF without source of infection.   EVD removed  SCDs for VTE prophylaxis NPO on TF, will call trauma surgery for PEG placement, hopefully tomorrow.  Bedrest    warfarin prior to admission, no antithrombotics now.  Resultant confusion, unable to follow commands mental status disproportionate to amount of IVH and CSF pressure.  Therapy recommendations: SNF  Disposition:  SNF  AMS - likely due to IVH and hydrocephalus - LTM EEG did not detect seizure activity  - d/c vimpat and dilantin - Dr. Cyril Mourning help appreciated - continue amantadine 200mg  bid for arousal.   Atrial Fibrillation  Home meds:  Warfarin prior to admission  INR 1.51 on admission 10/12. High as 2.10 on 09/03/14.   No longer an anticoagulation candidate secondary to hemorrhage  Had wide complex tachycardia, cardiology consulted  Consider Afib with aberrancy/rate related bundle branch block.  Continue metoprolol 75mg  bid and cardiazem 30mg  q6   Add lasix 40mg  daily  Continue on amiodarone  Repeat 2D echo unremarkable  High BNP but normal CEs  Cardiology recs appreciated.  On tele  Dysphagia - NPO now - PANDA tube for tube feeding - PEG declined by daughter and reconsider next Monday - trauma surgery on board - Swallow reevaluation by  speech today - patient will remain NPO. Probable PEG on Monday. - surgery signed off for now. Will need call them back on Monday for PEG.  Hypertension  Stable, on the higher side  BP goal < 160  On metoprolol, increase from 25mg  bid to 50mg  bid, and now 75mg  bid  Leukocytosis  - WBC down trending 13.2->11.9 ->11.0 -> 10.2 ->11.1 ->12.6 - cultures sent so far negative - vancon/zosyn 7 day course completed, d/c'ed today - afebrile  Hyperlipidemia  Home meds:  lipitor 80   LDL 110 goal < 70  D/c lipitor due to IVH  Other Stroke Risk Factors Advanced age   Obesity, Body mass index is 29.51 kg/(m^2).    Coronary artery disease - s/p CABG ICA stenosis s/p CEA 2015 PVD Atrial fibrillation - Coumadin therapy prior to admission  Other Active Problems  UTI with pyelonephritis - antibiotic course completed  CKD   Anemia  NG tube feedings   Hospital day # [id number]  Delton See PA-C [facility] Pager [phone] 09/19/2014, 8:39 AM  I, the attending vascular neurologist, have personally obtained a history, examined the patient, evaluated laboratory data, individually viewed imaging studies, and formulated the assessment and plan of care.  I have made any additions or clarifications directly to the above note and agree with the findings and plan as currently documented.   Marvel Plan, MD PhD Stroke Neurology 09/19/2014 7:58 PM     To contact Stroke Continuity provider, please refer to [email or web address]. After hours, contact General Neurology

## 2014-09-19 NOTE — Progress Notes (Signed)
I spoke with the daughter in detail yesterday.  We will await reconsultation.  Marta LamasJames O. Gae BonWyatt, III, MD, FACS [phone]--pager [phone]--office [facility]

## 2014-09-19 NOTE — Progress Notes (Signed)
Daughter declined PEG tube as of now.  Repeat swallow study next week. If patient fails, and daughter becomes agreeable, please call us back for PEG tube placement.  We will sign off for now.  Calah Gershman E 11:04 AM 09/19/2014

## 2014-09-19 NOTE — Progress Notes (Signed)
Physical Therapy Treatment Patient Details Name: Susan Calhoun MRN: [id number] DOB: 11/05/1928 Today's Date: 09/19/2014    History of Present Illness 80 yo wf admitted on 09/01/14 with MS changes. Initial CT showed some ventriculomegaly. Was being treated for UTI. MS changes worsened and an MRI was performed revelaed IVH, Ventricular drain placed on 09/04/14. PMHx-DM with neuropathy, CAD, CABG, afib, R TKA, glaucome    PT Comments    Pt alert and restless throughout the session. Followed only one visual and tactile cue (to use her RUE to pull herself from Lt lean in bed towards midline). Although pt has not made progress towards original goals, she had a medical decline since initial evaluation. Feel a further trial of PT is indicated to determine if pt can progress with her strength and mobility. Will see pt with +2 assist on next visit.    Follow Up Recommendations  SNF;Supervision/Assistance - 24 hour     Equipment Recommendations  None recommended by PT    Recommendations for Other Services       Precautions / Restrictions Precautions Precautions: Fall    Mobility  Bed Mobility Overal bed mobility: Needs Assistance Bed Mobility: Rolling Rolling: Total assist         General bed mobility comments: pt not assisting with rolling, although observed to move all 4 extremities; on attempt to straighten her in the bed, offered PT's arm linked at her Rt elbow--after PT initiated slight tug on her arm and waved her to scoot shoulders to the Rt, she pulled with Rt arm and righted herself to near midline  Transfers                    Ambulation/Gait                 Stairs            Wheelchair Mobility    Modified Rankin (Stroke Patients Only) Modified Rankin (Stroke Patients Only) Pre-Morbid Rankin Score: No symptoms Modified Rankin: Severe disability     Balance                                    Cognition Arousal/Alertness:  Awake/alert Behavior During Therapy: Restless Overall Cognitive Status: Difficult to assess                      Exercises Other Exercises Other Exercises: PROM x 4 extremities. Pt with active movement, however not on command    General Comments General comments (skin integrity, edema, etc.): Attempted to have pt follow simple one step commands with verbal, visual, and multi-modal cues (visual, demonstration, faciliation) with pt only following one gestural, facilitated prompt (see bed mobility). Pt was alert entire session (and reaching for her NG feeding tube (purposeful)      Pertinent Vitals/Pain Pain Assessment: Faces Faces Pain Scale: No hurt    Home Living                      Prior Function            PT Goals (current goals can now be found in the care plan section) Acute Rehab PT Goals Patient Stated Goal: unable to state PT Goal Formulation: Patient unable to participate in goal setting Time For Goal Achievement: 09/26/14 Potential to Achieve Goals: Poor Progress towards PT goals: Not progressing toward goals - comment (however this  is first session pt has been alert)    Frequency  Min 2X/week    PT Plan Current plan remains appropriate    Co-evaluation             End of Session   Activity Tolerance: Patient tolerated treatment well Patient left: in bed;with call bell/phone within reach;with bed alarm set;with restraints reapplied (bil mitts)     Time: [id number] PT Time Calculation (min): 10 min  Charges:  $Neuromuscular Re-education: 8-22 mins                    G Codes:      Ilyana Manuele 09/19/2014, 5:10 PM Pager [phone]

## 2014-09-19 NOTE — Clinical Social Work Placement (Addendum)
Clinical Social Work Department CLINICAL SOCIAL WORK PLACEMENT NOTE 09/19/2014  Patient:  Susan Calhoun,Susan Calhoun  Account Number:  [id number] Admit date:  09/01/2014  Clinical Social Worker:  Gwynne EdingerYSHEKA Octavion Mollenkopf, LCSWA  Date/time:  09/19/2014 02:21 PM  Clinical Social Work is seeking post-discharge placement for this patient at the following level of care:   SKILLED NURSING   (*CSW will update this form in Epic as items are completed)   09/19/2014  Patient/family provided with [facility] Department of Clinical Social Work's list of facilities offering this level of care within the geographic area requested by the patient (or if unable, by the patient's family).  09/19/2014  Patient/family informed of their freedom to choose among providers that offer the needed level of care, that participate in Medicare, Medicaid or managed care program needed by the patient, have an available bed and are willing to accept the patient.  09/19/2014  Patient/family informed of [facility]' ownership interest in [facility], as well as of the fact that they are under no obligation to receive care at this facility.  PASARR submitted to EDS on 09/19/2014 PASARR number received on 09/19/2014  FL2 transmitted to all facilities in geographic area requested by pt/family on  09/19/2014 FL2 transmitted to all facilities within larger geographic area on 09/19/2014  Patient informed that his/her managed care company has contracts with or will negotiate with  certain facilities, including the following:     Patient/family informed of bed offers received:  09/22/2014 [facility]  Physician recommends and patient chooses bed at    Patient to be transferred to [facility] on  09/30/2014 Patient to be transferred to facility by [facility]  Patient and family notified of transfer on 09/30/2014 Name of family member notified: [name]  The following physician request were  entered in Epic:   Additional Comments:  Gwynne EdingerDysheka Kaleisha Bhargava, MSW, [employer]WA [phone]

## 2014-09-20 ENCOUNTER — Inpatient Hospital Stay (HOSPITAL_COMMUNITY): Payer: Medicare (Managed Care)

## 2014-09-20 LAB — BASIC METABOLIC PANEL
Anion gap: 12 (ref 5–15)
BUN: 28 mg/dL — AB (ref 6–23)
CHLORIDE: 104 meq/L (ref 96–112)
CO2: 26 mEq/L (ref 19–32)
Calcium: 8.3 mg/dL — ABNORMAL LOW (ref 8.4–10.5)
Creatinine, Ser: 1 mg/dL (ref 0.50–1.10)
GFR calc Af Amer: 58 mL/min — ABNORMAL LOW (ref >90)
GFR calc non Af Amer: 50 mL/min — ABNORMAL LOW (ref >90)
GLUCOSE: 141 mg/dL — AB (ref 70–99)
Potassium: 3.9 mEq/L (ref 3.7–5.3)
SODIUM: 142 meq/L (ref 137–147)

## 2014-09-20 LAB — GLUCOSE, CAPILLARY
GLUCOSE-CAPILLARY: 155 mg/dL — AB (ref 70–99)
Glucose-Capillary: 125 mg/dL — ABNORMAL HIGH (ref 70–99)
Glucose-Capillary: 195 mg/dL — ABNORMAL HIGH (ref 70–99)
Glucose-Capillary: 124 mg/dL — ABNORMAL HIGH (ref 70–99)

## 2014-09-20 NOTE — Progress Notes (Signed)
NG feeding tube observed to be slightly pulled out from placement.   Nurse removed NG tube and will place another.  Pt tolerated well.   Will monitor.   Andrew AuVafiadis, Firas Guardado I 09/20/2014 4:18 PM

## 2014-09-20 NOTE — Progress Notes (Signed)
Nurse and charge RN, Marylene Landngela placed NG feeding tube to right nare.  Pt tolerated well.  Placement pending on KUB stat xray.  Andrew AuVafiadis, Oral Hallgren I 09/20/2014 5:53 PM

## 2014-09-20 NOTE — Progress Notes (Signed)
STROKE TEAM PROGRESS NOTE   HISTORY Susan Calhoun is an 80 y.o. female who was noted by family to be acting confused > 2 weeks prior. Patient was brought to the hospital where she was diagnosed with a UTI. She was given Rocephin and sent home on Keflex. Due to not being awake enough to take her medications she was brought back to the hospital. MRI brain showed "layering material in the occipital horns of lateral ventricles, at least partly hemorrhagic. Question focal thrombus in the atrium of the right lateral ventricle, possibly associated with the choroid. This could be the site of hemorrhage". Infectious disease was consulted and they do not feel this is bacterial meningoencephalitis and ABX were D/C'd. Neuro surgery placed a Ventriculostomy drain for HCP. Since drain placement her mentation has not fully resolved. Repeat head CT shows no worsening of HCP or blood products. Neurology was asked to further evaluate. At time of evaluation, patient intubated on the vent, Temp 100.8, WBC 12.2. CSF: WBC 168, red blood cells 70000, lymph 7, neutrophile 85, glucose 78, protein 139    SUBJECTIVE (INTERVAL HISTORY) No family members present. The patient's eyes are open and she appears somewhat more alert.  NG tube is in place. Now on telemetry. Speech therapy reevaluated the patient swallowing. She is to remain NPO. Plan for PEG on Monday.   OBJECTIVE Temp:  [97.9 F (36.6 C)-99.7 F (37.6 C)] 98.7 F (37.1 C) (10/31 0544) Pulse Rate:  [99-112] 108 (10/31 0544) Cardiac Rhythm:  [-] Atrial fibrillation (10/31 0725) Resp:  [16-22] 16 (10/31 0544) BP: (140-167)/(66-91) 145/81 mmHg (10/31 0544) SpO2:  [97 %-100 %] 98 % (10/31 0544)   Recent Labs Lab 09/19/14 1131 09/19/14 1157 09/19/14 1713 09/20/14 0007 09/20/14 0546  GLUCAP 197* 191* 128* 155* 125*    Recent Labs Lab 09/15/14 0440 09/16/14 0400 09/16/14 0850 09/17/14 0458 09/18/14 0536 09/19/14 0758  NA 139 139  --  141 142 143  K  4.0 3.6*  --  4.4 4.2 4.2  CL 103 104  --  106 105 106  CO2 22 23  --  23 23 25   GLUCOSE 124* 147*  --  145* 129* 89  BUN 45* 39*  --  35* 30* 28*  CREATININE 1.34* 1.15*  --  1.20* 1.01 1.04  CALCIUM 7.6* 7.9*  --  7.9* 8.1* 8.2*  MG  --   --  2.0  --   --   --   PHOS  --   --  3.6  --   --   --    No results found for this basename: AST, ALT, ALKPHOS, BILITOT, PROT, ALBUMIN,  in the last 168 hours  Recent Labs Lab 09/15/14 0440 09/16/14 0400 09/17/14 0458 09/18/14 0536 09/19/14 0758  WBC 11.2* 11.0* 10.2 11.1* 12.6*  HGB 8.1* 8.4* 8.3* 8.9* 8.8*  HCT 24.9* 26.1* 25.9* 28.0* 27.7*  MCV 91.5 91.3 92.2 94.9 93.6  PLT 244 246 245 266 266    Recent Labs Lab 09/16/14 0850 09/16/14 1430  TROPONINI 0.30* <0.30   No results found for this basename: LABPROT, INR,  in the last 72 hours  Recent Labs  09/19/14 1440  COLORURINE YELLOW  LABSPEC 1.012  PHURINE 6.0  GLUCOSEU NEGATIVE  HGBUR SMALL*  BILIRUBINUR NEGATIVE  KETONESUR NEGATIVE  PROTEINUR 100*  UROBILINOGEN 0.2  NITRITE NEGATIVE  LEUKOCYTESUR NEGATIVE       Component Value Date/Time   CHOL 204* 09/02/2014 0530   TRIG 149 09/02/2014 0530  HDL 64 09/02/2014 0530   CHOLHDL 3.2 09/02/2014 0530   VLDL 30 09/02/2014 0530   LDLCALC 110* 09/02/2014 0530   Lab Results  Component Value Date   HGBA1C 5.6 09/01/2014   No results found for this basename: labopia,  cocainscrnur,  labbenz,  amphetmu,  thcu,  labbarb    No results found for this basename: ETH,  in the last 168 hours  I have personally reviewed the radiological images below and agree with the radiology interpretations.  Ct Head Wo Contrast 09/14/14-  1. Stable appearance of hydrocephalus involving the lateral and third ventricles with some residual layering blood products.  2. Persisting gas within the frontal horn of the right lateral ventricle following removal of the ventriculostomy catheter.  3. Stable mild diffuse white matter disease without  evidence for acute infarct or significant interval change.  4. Stable focal hemorrhage within the left cerebral peduncle.  09/11/14 - Normal ventricular enlargement is stable. Intraventricular hemorrhage slightly improved. Small area of hemorrhage or calcification in the right parietal lobe is stable.  09/08/2014  1. Unchanged dilatation of the third ventricle and likely no significant interval change in dilatation of the lateral ventricles. 2. Unchanged intraventricular blood. 3. Small volume of subarachnoid blood.    09/05/2014   1. Interval placement of right frontal approach ventriculostomy catheter with tip in the third ventricle. Ventricles remain dilated but are slightly decreased in size relative to previous MRI from 09/03/2014. 2. Layering hemorrhage within the occipital horns of the lateral ventricles bilaterally, stable from prior. 3. Question trace acute subarachnoid hemorrhage within the right sylvian fissure.     08/31/2014  Mild, likely age-appropriate, centralized volume loss without acute intracranial process.     [name] Contrast 09/12/14 Diffuse ventricular enlargement unchanged from CT scan yesterday. Findings suggest communicating hydrocephalus. Interval removal ventricular catheter with gas now in the right frontal horn. Intraventricular hemorrhage and subarachnoid hemorrhage appears stable. Negative for acute ischemic infarct.  09/03/2014    Acute hydrocephalus, worsened since 3 days ago. Layering material in the occipital horns of lateral ventricles, at least partly hemorrhagic. Question focal thrombus in the atrium of the right lateral ventricle, possibly associated with the choroid. This could be the site of hemorrhage. No specific vascular lesion is identified. No intraparenchymal hemorrhage. The differential diagnosis does include meningitis.    Dg Chest Port 1 View 09/06/2014   Feeding tube with weighted tip in stomach.   09/04/2014    New left jugular central venous  catheter tip overlies the distal SVC. No evidence of pneumothorax.  Stable cardiomegaly.  No active lung disease.     Dg Chest 2 View 08/31/2014    Borderline cardiomegaly and lung hyperexpansion without acute cardiopulmonary disease.    EEG 09/11/14 : This is a markedly abnormal electroencephalogram secondary to general background slowing with frequent intermittent discharges of triphasic morphology. Although this may be seen with a severe metabolic encephalopathy can not rule out the possibility that this may represent epileptic phenomenon. Clinical correlation recommended.   2D echo 09/17/14 - - Left ventricle: The cavity size was normal. Wall thickness was normal. Systolic function was normal. The estimated ejection fraction was in the range of 50% to 55%. Wall motion was normal; there were no regional wall motion abnormalities. - Mitral valve: Calcified annulus. There was moderate regurgitation. - Left atrium: The atrium was moderately dilated. - Pulmonary arteries: PA peak pressure: 35 mm Hg (S).   PHYSICAL EXAM Frail elderly Caucasian lady not in  distress. Afebrile. Head is nontraumatic. Neck is supple without bruit.  Cardiac exam no murmur or gallop, but tachycardia. Lungs are clear to auscultation. Distal pulses are well felt.  Neurological Exam: The patient is laying in bed with eyes open. She is more responsive today. She did state that she is doing okay but does not follow commands. She focuses and tracks.. Pupils exam showed left surgical pupil, left sluggish to light. Slight dysconjugate gaze with left eye hypotropia. Roving eye movements present. Does not blink to threat on either side. Fundi could not be visualized. Face is symmetric. Tongue is midline. BLE withdraw to pain but not against gravity, LUE flaccid 2-3/5 spontaneously. RUE at least 3/5 spontaneously. Left plantar upgoing right equivocal. Sensation and coordination cannot be reliably tested. Gait was not  tested.  ASSESSMENT/PLAN Ms. Jamesetta SoGiuseppina Buffone is a 80 y.o. female with history of altered mental status following UTI, seen in the ED 08/31/2014 - admitted 09/01/2014 with confusion. Patient was on coumadin at that time for atrial fibrillation. INR 1.84. Administered Levaquin. BP elevated on the 11th at 166/44, max BP next day 189/112. Pt INR trending up and 2.10 on 09/03/14. In retrospect, initial CT with L IVH. Repeat CT done during workup found to have worsening of IVH with bilateral occipital intraventricular blood with hydrocephalus. Hypertension and coumadin associated coagulapathy etiology of hemorrhage.   Stroke:  Non-dominant IVH secondary to coumadin associated coagulopathy in setting of uncontrolled hypertension.  Intraventricular drain placed with no improvement of mental status.  CSF without source of infection.   EVD removed  SCDs for VTE prophylaxis NPO on TF, will call trauma surgery for PEG placement, hopefully tomorrow.  Bedrest    warfarin prior to admission, no antithrombotics now.  Resultant confusion, unable to follow commands mental status disproportionate to amount of IVH and CSF pressure.  Therapy recommendations: SNF  Disposition:  SNF  AMS - likely due to IVH and hydrocephalus - LTM EEG did not detect seizure activity  - d/c vimpat and dilantin - Dr. Cyril Mourningamillo help appreciated - continue amantadine 200mg  bid for arousal.   Atrial Fibrillation  Home meds:  Warfarin prior to admission  INR 1.51 on admission 10/12. High as 2.10 on 09/03/14.   No longer an anticoagulation candidate secondary to hemorrhage  Had wide complex tachycardia, cardiology consulted - see note 09/16/2014  Consider Afib with aberrancy/rate related bundle branch block.  Continue metoprolol 75mg  bid and cardiazem 30mg  q6   Add lasix 40mg  daily  Continue on amiodarone  Repeat 2D echo unremarkable  High BNP but normal CEs  Cardiology recs appreciated.  On  tele  Dysphagia - NPO now - PANDA tube for tube feeding - PEG declined by daughter and reconsider next Monday - trauma surgery on board - Swallow reevaluation by speech Friday - patient will remain NPO. Possible PEG on Monday. - Surgery signed off for now. Will need call them back on Monday for PEG. (see consult 09/17/2014 - PEG had been planned for Friday 09/19/2014)  Hypertension  Stable, on the higher side  BP goal < 160  On metoprolol, increase from 25mg  bid to 50mg  bid, and now 75mg  bid  Leukocytosis  - WBC down trending 13.2->11.9 ->11.0 -> 10.2 ->11.1 ->12.6 - cultures sent so far negative - vancon/zosyn 7 day course completed - afebrile  Hyperlipidemia  Home meds:  lipitor 80   LDL 110 goal < 70  D/c lipitor due to IVH  Other Stroke Risk Factors Advanced age   Obesity, Body  mass index is 29.51 kg/(m^2).    Coronary artery disease - s/p CABG ICA stenosis s/p CEA 2015 PVD Atrial fibrillation - Coumadin therapy prior to admission  Other Active Problems  UTI with pyelonephritis - antibiotic course completed. U/A 09/19/2014 as above.  CKD   Anemia  NG tube feedings  C Diff specimen negative from 02/15/2014   Hospital day # [address]  David Rinehuls PA-C [facility] Pager [phone] 09/20/2014, 8:24 AM  The patient was seen and examined by me; notes, chart and tests reviewed and discussed with midlevel provider and other providers.      To contact Stroke Continuity provider, please refer to [email or web address]. After hours, contact General Neurology

## 2014-09-21 LAB — BASIC METABOLIC PANEL
Anion gap: 12 (ref 5–15)
Anion gap: 13 (ref 5–15)
BUN: 28 mg/dL — ABNORMAL HIGH (ref 6–23)
BUN: 30 mg/dL — ABNORMAL HIGH (ref 6–23)
CO2: 27 meq/L (ref 19–32)
CO2: 25 mEq/L (ref 19–32)
Calcium: 8.5 mg/dL (ref 8.4–10.5)
Calcium: 8.4 mg/dL (ref 8.4–10.5)
Chloride: 106 mEq/L (ref 96–112)
Chloride: 108 mEq/L (ref 96–112)
Creatinine, Ser: 0.93 mg/dL (ref 0.50–1.10)
Creatinine, Ser: 1.03 mg/dL (ref 0.50–1.10)
GFR calc Af Amer: 64 mL/min — ABNORMAL LOW (ref >90)
GFR calc non Af Amer: 55 mL/min — ABNORMAL LOW (ref >90)
GFR, EST AFRICAN AMERICAN: 56 mL/min — AB (ref >90)
GFR, EST NON AFRICAN AMERICAN: 49 mL/min — AB (ref >90)
GLUCOSE: 160 mg/dL — AB (ref 70–99)
Glucose, Bld: 138 mg/dL — ABNORMAL HIGH (ref 70–99)
Potassium: 5.1 mEq/L (ref 3.7–5.3)
Potassium: 5 mEq/L (ref 3.7–5.3)
SODIUM: 146 meq/L (ref 137–147)
Sodium: 145 mEq/L (ref 137–147)

## 2014-09-21 LAB — CBC
HCT: 30.6 % — ABNORMAL LOW (ref 36.0–46.0)
Hemoglobin: 9.8 g/dL — ABNORMAL LOW (ref 12.0–15.0)
MCH: 30.5 pg (ref 26.0–34.0)
MCHC: 32 g/dL (ref 30.0–36.0)
MCV: 95.3 fL (ref 78.0–100.0)
Platelets: 320 10*3/uL (ref 150–400)
RBC: 3.21 MIL/uL — AB (ref 3.87–5.11)
RDW: 17.5 % — ABNORMAL HIGH (ref 11.5–15.5)
WBC: 12.7 10*3/uL — ABNORMAL HIGH (ref 4.0–10.5)

## 2014-09-21 LAB — GLUCOSE, CAPILLARY
GLUCOSE-CAPILLARY: 159 mg/dL — AB (ref 70–99)
Glucose-Capillary: 247 mg/dL — ABNORMAL HIGH (ref 70–99)
Glucose-Capillary: 120 mg/dL — ABNORMAL HIGH (ref 70–99)

## 2014-09-21 NOTE — Progress Notes (Signed)
Patients daughter came to visit and patient spoke and responded in short phrases to verbal questions asked by her daughter.  Patient is able to communicate verbally, just not with staff

## 2014-09-21 NOTE — Plan of Care (Signed)
Problem: Phase II Progression Outcomes Goal: Vital signs remain stable, temperature < 100 Outcome: Progressing

## 2014-09-21 NOTE — Progress Notes (Signed)
STROKE TEAM PROGRESS NOTE   HISTORY Susan Calhoun is an 80 y.o. female who was noted by family to be acting confused > 2 weeks prior. Patient was brought to the hospital where she was diagnosed with a UTI. She was given Rocephin and sent home on Keflex. Due to not being awake enough to take her medications she was brought back to the hospital. MRI brain showed "layering material in the occipital horns of lateral ventricles, at least partly hemorrhagic. Question focal thrombus in the atrium of the right lateral ventricle, possibly associated with the choroid. This could be the site of hemorrhage". Infectious disease was consulted and they do not feel this is bacterial meningoencephalitis and ABX were D/C'd. Neuro surgery placed a Ventriculostomy drain for HCP. Since drain placement her mentation has not fully resolved. Repeat head CT shows no worsening of HCP or blood products. Neurology was asked to further evaluate. At time of evaluation, patient intubated on the vent, Temp 100.8, WBC 12.2. CSF: WBC 168, red blood cells 70000, lymph 7, neutrophile 85, glucose 78, protein 139    SUBJECTIVE (INTERVAL HISTORY) No family members present. The patient's eyes are open and she appears somewhat more alert.  NG tube is in place. Now on telemetry. Speech therapy reevaluated the patient swallowing. She is to remain NPO. Plan for PEG on Monday.   OBJECTIVE Temp:  [98.4 F (36.9 C)-99.3 F (37.4 C)] 98.8 F (37.1 C) (11/01 0630) Pulse Rate:  [82-114] 89 (11/01 0630) Cardiac Rhythm:  [-] Atrial fibrillation (10/31 2048) Resp:  [16-22] 16 (11/01 0630) BP: (138-174)/(60-96) 140/65 mmHg (11/01 0630) SpO2:  [96 %-99 %] 96 % (11/01 0630)   Recent Labs Lab 09/20/14 0007 09/20/14 0546 09/20/14 1157 09/20/14 1737 09/21/14 0059  GLUCAP 155* 125* 195* 124* 159*    Recent Labs Lab 09/16/14 0400 09/16/14 0850 09/17/14 0458 09/18/14 0536 09/19/14 0758 09/20/14 0805  NA 139  --  141 142 143 142  K  3.6*  --  4.4 4.2 4.2 3.9  CL 104  --  106 105 106 104  CO2 23  --  23 23 25 26   GLUCOSE 147*  --  145* 129* 89 141*  BUN 39*  --  35* 30* 28* 28*  CREATININE 1.15*  --  1.20* 1.01 1.04 1.00  CALCIUM 7.9*  --  7.9* 8.1* 8.2* 8.3*  MG  --  2.0  --   --   --   --   PHOS  --  3.6  --   --   --   --    No results for input(s): AST, ALT, ALKPHOS, BILITOT, PROT, ALBUMIN in the last 168 hours.  Recent Labs Lab 09/16/14 0400 09/17/14 0458 09/18/14 0536 09/19/14 0758 09/21/14 0746  WBC 11.0* 10.2 11.1* 12.6* 12.7*  HGB 8.4* 8.3* 8.9* 8.8* 9.8*  HCT 26.1* 25.9* 28.0* 27.7* 30.6*  MCV 91.3 92.2 94.9 93.6 95.3  PLT 246 245 266 266 320    Recent Labs Lab 09/16/14 0850 09/16/14 1430  TROPONINI 0.30* <0.30   No results for input(s): LABPROT, INR in the last 72 hours.  Recent Labs  09/19/14 1440  COLORURINE YELLOW  LABSPEC 1.012  PHURINE 6.0  GLUCOSEU NEGATIVE  HGBUR SMALL*  BILIRUBINUR NEGATIVE  KETONESUR NEGATIVE  PROTEINUR 100*  UROBILINOGEN 0.2  NITRITE NEGATIVE  LEUKOCYTESUR NEGATIVE       Component Value Date/Time   CHOL 204* 09/02/2014 0530   TRIG 149 09/02/2014 0530   HDL 64 09/02/2014 0530  CHOLHDL 3.2 09/02/2014 0530   VLDL 30 09/02/2014 0530   LDLCALC 110* 09/02/2014 0530   Lab Results  Component Value Date   HGBA1C 5.6 09/01/2014   No results found for: LABOPIA  No results for input(s): ETH in the last 168 hours.  I have personally reviewed the radiological images below and agree with the radiology interpretations.  Ct Head Wo Contrast 09/14/14-  1. Stable appearance of hydrocephalus involving the lateral and third ventricles with some residual layering blood products.  2. Persisting gas within the frontal horn of the right lateral ventricle following removal of the ventriculostomy catheter.  3. Stable mild diffuse white matter disease without evidence for acute infarct or significant interval change.  4. Stable focal hemorrhage within the left  cerebral peduncle.  09/11/14 - Normal ventricular enlargement is stable. Intraventricular hemorrhage slightly improved. Small area of hemorrhage or calcification in the right parietal lobe is stable.  09/08/2014  1. Unchanged dilatation of the third ventricle and likely no significant interval change in dilatation of the lateral ventricles. 2. Unchanged intraventricular blood. 3. Small volume of subarachnoid blood.    09/05/2014   1. Interval placement of right frontal approach ventriculostomy catheter with tip in the third ventricle. Ventricles remain dilated but are slightly decreased in size relative to previous MRI from 09/03/2014. 2. Layering hemorrhage within the occipital horns of the lateral ventricles bilaterally, stable from prior. 3. Question trace acute subarachnoid hemorrhage within the right sylvian fissure.     08/31/2014  Mild, likely age-appropriate, centralized volume loss without acute intracranial process.     [name] Contrast 09/12/14 Diffuse ventricular enlargement unchanged from CT scan yesterday. Findings suggest communicating hydrocephalus. Interval removal ventricular catheter with gas now in the right frontal horn. Intraventricular hemorrhage and subarachnoid hemorrhage appears stable. Negative for acute ischemic infarct.  09/03/2014    Acute hydrocephalus, worsened since 3 days ago. Layering material in the occipital horns of lateral ventricles, at least partly hemorrhagic. Question focal thrombus in the atrium of the right lateral ventricle, possibly associated with the choroid. This could be the site of hemorrhage. No specific vascular lesion is identified. No intraparenchymal hemorrhage. The differential diagnosis does include meningitis.    Dg Chest Port 1 View 09/06/2014   Feeding tube with weighted tip in stomach.   09/04/2014    New left jugular central venous catheter tip overlies the distal SVC. No evidence of pneumothorax.  Stable cardiomegaly.  No active  lung disease.     Dg Chest 2 View 08/31/2014    Borderline cardiomegaly and lung hyperexpansion without acute cardiopulmonary disease.    EEG 09/11/14 : This is a markedly abnormal electroencephalogram secondary to general background slowing with frequent intermittent discharges of triphasic morphology. Although this may be seen with a severe metabolic encephalopathy can not rule out the possibility that this may represent epileptic phenomenon. Clinical correlation recommended.   2D echo 09/17/14 - - Left ventricle: The cavity size was normal. Wall thickness was normal. Systolic function was normal. The estimated ejection fraction was in the range of 50% to 55%. Wall motion was normal; there were no regional wall motion abnormalities. - Mitral valve: Calcified annulus. There was moderate regurgitation. - Left atrium: The atrium was moderately dilated. - Pulmonary arteries: PA peak pressure: 35 mm Hg (S).   PHYSICAL EXAM Frail elderly Caucasian lady not in distress. Afebrile. Head is nontraumatic. Neck is supple without bruit.  Cardiac exam no murmur or gallop, but tachycardia. Lungs are clear to  auscultation. Distal pulses are well felt.  Neurological Exam: The patient is laying in bed with eyes open. She is even more responsive today. She did state that she is doing okay;  follow command x 1 RUE. She focuses and tracks. Pupils exam showed left surgical pupil, left sluggish to light. Slight dysconjugate gaze with left eye hypotropia. Roving eye movements present. Does not blink to threat on either side. Fundi could not be visualized. Face is symmetric. Tongue is midline. BLE withdraw to pain but not against gravity, LUE flaccid 2-3/5 spontaneously. RUE at least 3/5 spontaneously. Left plantar upgoing right equivocal. Sensation and coordination cannot be reliably tested. Gait was not tested.  ASSESSMENT/PLAN Ms. Charlynn Salih is a 80 y.o. female with history of altered mental status  following UTI, seen in the ED 08/31/2014 - admitted 09/01/2014 with confusion. Patient was on coumadin at that time for atrial fibrillation. INR 1.84. Administered Levaquin. BP elevated on the 11th at 166/44, max BP next day 189/112. Pt INR trending up and 2.10 on 09/03/14. In retrospect, initial CT with L IVH. Repeat CT done during workup found to have worsening of IVH with bilateral occipital intraventricular blood with hydrocephalus. Hypertension and coumadin associated coagulapathy etiology of hemorrhage.   Stroke:  Non-dominant IVH secondary to coumadin associated coagulopathy in setting of uncontrolled hypertension.  Intraventricular drain placed with no improvement of mental status.  CSF without source of infection.   EVD removed  SCDs for VTE prophylaxis  Bedrest    warfarin prior to admission, no antithrombotics now.  Resultant confusion, unable to follow commands mental status disproportionate to amount of IVH and CSF pressure.  Therapy recommendations: SNF  Disposition:  SNF  AMS - likely due to IVH and hydrocephalus - LTM EEG did not detect seizure activity  - d/c vimpat and dilantin - Dr. Cyril Mourning help appreciated - continue amantadine 200mg  bid for arousal.   Atrial Fibrillation  Home meds:  Warfarin prior to admission  INR 1.51 on admission 10/12. High as 2.10 on 09/03/14.   No longer an anticoagulation candidate secondary to hemorrhage  Had wide complex tachycardia, cardiology consulted - see note 09/16/2014  Consider Afib with aberrancy/rate related bundle branch block.  Continue metoprolol 75mg  bid and cardiazem 30mg  q6   Add lasix 40mg  daily  Continue on amiodarone  Repeat 2D echo unremarkable  High BNP but normal CEs  Cardiology recs appreciated.  On tele - strips reveal rate frequently > 100. May need to increase Cardizem or Lopressor. BP has room.  Dysphagia - NPO now - PANDA tube for tube feeding - PEG declined by daughter but will  reconsider Monday - trauma surgery on board - Swallow reevaluation by speech Friday - patient to remain NPO. Possible PEG on Monday. - Surgery signed off for now. Will need call them back on Monday for PEG. (see consult 09/17/2014 - PEG had been planned for Friday 09/19/2014)  Hypertension  Stable, on the higher side  SBP goal < 160 - Meeting goal most of the time. Diastolic BP occasionally high.  On metoprolol, increase from 25mg  bid to 50mg  bid, and now 75mg  bid  Leukocytosis  - WBC down trending 13.2->11.9 ->11.0 -> 10.2 ->11.1 ->12.6 - cultures sent so far negative - vancon/zosyn 7 day course completed - afebrile  Hyperlipidemia  Home meds:  lipitor 80   LDL 110 goal < 70  D/c lipitor due to IVH  Other Stroke Risk Factors Advanced age . Obesity, Body mass index is 29.51 kg/(m^2).  Marland Kitchen  Coronary artery disease - s/p CABG ICA stenosis s/p CEA 2015 PVD Atrial fibrillation - Coumadin therapy prior to admission  Other Active Problems  UTI with pyelonephritis - antibiotic course completed. U/A 09/19/2014 as above.  CKD   Anemia  NG tube feedings.. PEG ? In AM  C Diff specimen negative from 09/17/2014   Hospital day # 20  Delton See [facility] Pager [phone] 09/21/2014, 8:18 AM  The patient was seen and examined by me; notes, chart and tests reviewed and discussed with midlevel provider and other providers.      To contact Stroke Continuity provider, please refer to [email or web address]. After hours, contact General Neurology

## 2014-09-22 LAB — GLUCOSE, CAPILLARY
GLUCOSE-CAPILLARY: 138 mg/dL — AB (ref 70–99)
GLUCOSE-CAPILLARY: 156 mg/dL — AB (ref 70–99)
Glucose-Capillary: 147 mg/dL — ABNORMAL HIGH (ref 70–99)
Glucose-Capillary: 152 mg/dL — ABNORMAL HIGH (ref 70–99)
Glucose-Capillary: 171 mg/dL — ABNORMAL HIGH (ref 70–99)
Glucose-Capillary: 195 mg/dL — ABNORMAL HIGH (ref 70–99)

## 2014-09-22 LAB — BASIC METABOLIC PANEL
ANION GAP: 13 (ref 5–15)
BUN: 34 mg/dL — ABNORMAL HIGH (ref 6–23)
CHLORIDE: 106 meq/L (ref 96–112)
CO2: 25 mEq/L (ref 19–32)
CREATININE: 1.07 mg/dL (ref 0.50–1.10)
Calcium: 8.6 mg/dL (ref 8.4–10.5)
GFR calc non Af Amer: 46 mL/min — ABNORMAL LOW (ref >90)
GFR, EST AFRICAN AMERICAN: 54 mL/min — AB (ref >90)
Glucose, Bld: 145 mg/dL — ABNORMAL HIGH (ref 70–99)
POTASSIUM: 5.3 meq/L (ref 3.7–5.3)
Sodium: 144 mEq/L (ref 137–147)

## 2014-09-22 LAB — CBC
HCT: 32.8 % — ABNORMAL LOW (ref 36.0–46.0)
Hemoglobin: 10.3 g/dL — ABNORMAL LOW (ref 12.0–15.0)
MCH: 30.5 pg (ref 26.0–34.0)
MCHC: 31.4 g/dL (ref 30.0–36.0)
MCV: 97 fL (ref 78.0–100.0)
PLATELETS: 269 10*3/uL (ref 150–400)
RBC: 3.38 MIL/uL — ABNORMAL LOW (ref 3.87–5.11)
RDW: 18 % — AB (ref 11.5–15.5)
WBC: 12.3 10*3/uL — AB (ref 4.0–10.5)

## 2014-09-22 NOTE — Progress Notes (Signed)
UR completed 

## 2014-09-22 NOTE — Progress Notes (Signed)
STROKE TEAM PROGRESS NOTE   HISTORY Susan Calhoun is an 80 y.o. female who was noted by family to be acting confused > 2 weeks prior. Patient was brought to the hospital where she was diagnosed with a UTI. She was given Rocephin and sent home on Keflex. Due to not being awake enough to take her medications she was brought back to the hospital. MRI brain showed "layering material in the occipital horns of lateral ventricles, at least partly hemorrhagic. Question focal thrombus in the atrium of the right lateral ventricle, possibly associated with the choroid. This could be the site of hemorrhage". Infectious disease was consulted and they do not feel this is bacterial meningoencephalitis and ABX were D/C'd. Neuro surgery placed a Ventriculostomy drain for HCP. Since drain placement her mentation has not fully resolved. Repeat head CT shows no worsening of HCP or blood products. Neurology was asked to further evaluate. At time of evaluation, patient intubated on the vent, Temp 100.8, WBC 12.2. CSF: WBC 168, red blood cells 70000, lymph 7, neutrophile 85, glucose 78, protein 139   SUBJECTIVE (INTERVAL HISTORY) Daughter is at the bedside. States she has been at the bedside all day waiting to sign consent for PEG.    OBJECTIVE Temp:  [97.9 F (36.6 C)-98.7 F (37.1 C)] 97.9 F (36.6 C) (11/02 0918) Pulse Rate:  [83-121] 121 (11/02 0918) Cardiac Rhythm:  [-] Atrial fibrillation (11/02 0845) Resp:  [18-20] 20 (11/02 0918) BP: (118-163)/(46-96) 150/46 mmHg (11/02 0918) SpO2:  [99 %-100 %] 100 % (11/02 0918)   Recent Labs Lab 09/21/14 1211 09/21/14 1652 09/22/14 0055 09/22/14 0632 09/22/14 1105  GLUCAP 247* 120* 171* 152* 195*    Recent Labs Lab 09/16/14 0850  09/19/14 0758 09/20/14 0805 09/21/14 0746 09/21/14 1830 09/22/14 0611  NA  --   < > 143 142 145 146 144  K  --   < > 4.2 3.9 5.1 5.0 5.3  CL  --   < > 106 104 106 108 106  CO2  --   < > 25 26 27 25 25   GLUCOSE  --   < >  89 141* 160* 138* 145*  BUN  --   < > 28* 28* 28* 30* 34*  CREATININE  --   < > 1.04 1.00 0.93 1.03 1.07  CALCIUM  --   < > 8.2* 8.3* 8.5 8.4 8.6  MG 2.0  --   --   --   --   --   --   PHOS 3.6  --   --   --   --   --   --   < > = values in this interval not displayed. No results for input(s): AST, ALT, ALKPHOS, BILITOT, PROT, ALBUMIN in the last 168 hours.  Recent Labs Lab 09/17/14 0458 09/18/14 0536 09/19/14 0758 09/21/14 0746 09/22/14 0611  WBC 10.2 11.1* 12.6* 12.7* 12.3*  HGB 8.3* 8.9* 8.8* 9.8* 10.3*  HCT 25.9* 28.0* 27.7* 30.6* 32.8*  MCV 92.2 94.9 93.6 95.3 97.0  PLT 245 266 266 320 269    Recent Labs Lab 09/16/14 0850 09/16/14 1430  TROPONINI 0.30* <0.30   No results for input(s): LABPROT, INR in the last 72 hours.  Recent Labs  09/19/14 1440  COLORURINE YELLOW  LABSPEC 1.012  PHURINE 6.0  GLUCOSEU NEGATIVE  HGBUR SMALL*  BILIRUBINUR NEGATIVE  KETONESUR NEGATIVE  PROTEINUR 100*  UROBILINOGEN 0.2  NITRITE NEGATIVE  LEUKOCYTESUR NEGATIVE       Component Value  Date/Time   CHOL 204* 09/02/2014 0530   TRIG 149 09/02/2014 0530   HDL 64 09/02/2014 0530   CHOLHDL 3.2 09/02/2014 0530   VLDL 30 09/02/2014 0530   LDLCALC 110* 09/02/2014 0530   Lab Results  Component Value Date   HGBA1C 5.6 09/01/2014    Ct Head Wo Contrast 09/14/2014  1. Stable appearance of hydrocephalus involving the lateral and third ventricles with some residual layering blood products. 2. Persisting gas within the frontal horn of the right lateral ventricle following removal of the ventriculostomy catheter. 3. Stable mild diffuse white matter disease without evidence for acute infarct or significant interval change. 4. Stable focal hemorrhage within the left cerebral peduncle. 09/11/2014   Normal ventricular enlargement is stable. Intraventricular hemorrhage slightly improved. Small area of hemorrhage or calcification in the right parietal lobe is stable.  09/08/2014  1. Unchanged  dilatation of the third ventricle and likely no significant interval change in dilatation of the lateral ventricles. 2. Unchanged intraventricular blood. 3. Small volume of subarachnoid blood.    09/05/2014   1. Interval placement of right frontal approach ventriculostomy catheter with tip in the third ventricle. Ventricles remain dilated but are slightly decreased in size relative to previous MRI from 09/03/2014. 2. Layering hemorrhage within the occipital horns of the lateral ventricles bilaterally, stable from prior. 3. Question trace acute subarachnoid hemorrhage within the right sylvian fissure.     08/31/2014  Mild, likely age-appropriate, centralized volume loss without acute intracranial process.     [name] Contrast 09/12/14 Diffuse ventricular enlargement unchanged from CT scan yesterday. Findings suggest communicating hydrocephalus. Interval removal ventricular catheter with gas now in the right frontal horn. Intraventricular hemorrhage and subarachnoid hemorrhage appears stable. Negative for acute ischemic infarct. 09/03/2014    Acute hydrocephalus, worsened since 3 days ago. Layering material in the occipital horns of lateral ventricles, at least partly hemorrhagic. Question focal thrombus in the atrium of the right lateral ventricle, possibly associated with the choroid. This could be the site of hemorrhage. No specific vascular lesion is identified. No intraparenchymal hemorrhage. The differential diagnosis does include meningitis.    Dg Chest Port 1 View 09/17/2014 Central vascular congestion without convincing pulmonary edema. Persistent small bilateral pleural effusion with bilateral basilar atelectasis or infiltrate. Status post median sternotomy. Stable NG tube and left IJ central line position. 09/14/2014  Stable cardiomegaly with vascular congestion. Persistent mild bibasilar atelectasis, less likely airspace disease. Small pleural effusions. 09/13/2014  Increasing bibasilar  atelectasis or infiltrates. Suspect small effusions. 09/06/2014   Feeding tube with weighted tip in stomach.   09/04/2014    New left jugular central venous catheter tip overlies the distal SVC. No evidence of pneumothorax.  Stable cardiomegaly.  No active lung disease.    08/31/2014    Borderline cardiomegaly and lung hyperexpansion without acute cardiopulmonary disease.    EEG 09/11/14 : This is a markedly abnormal electroencephalogram secondary to general background slowing with frequent intermittent discharges of triphasic morphology. Although this may be seen with a severe metabolic encephalopathy can not rule out the possibility that this may represent epileptic phenomenon. Clinical correlation recommended.   2D echo 09/17/14 - Left ventricle: The cavity size was normal. Wall thickness was normal. Systolic function was normal. The estimated ejection fraction was in the range of 50% to 55%. Wall motion was normal; there were no regional wall motion abnormalities.  - Mitral valve: Calcified annulus. There was moderateregurgitation. - Left atrium: The atrium was moderately dilated. - Pulmonary arteries:  PA peak pressure: 35 mm Hg (S).  Carotid Doppler  09/03/2014  - Mild technical difficulty due to movement of the arms and neck. - Right - 1% to 39% ICA stenosis. Vertebral artery flow is antegrade. - Left - 40% to 59% ICA stensosis. Vertebral artery flow isantegrade.   PHYSICAL EXAM Frail elderly Caucasian lady not in distress. Afebrile. Head is nontraumatic. Neck is supple without bruit.  Cardiac exam no murmur or gallop, but tachycardia. Lungs are clear to auscultation. Distal pulses are well felt. Neurological Exam: The patient is laying in bed with eyes open. She is   responsive today. She did state that she is doing okay;  follow command x 1 RUE. She focuses and tracks. Pupils exam showed left surgical pupil, left sluggish to light. Slight dysconjugate gaze with left eye hypotropia.  Roving eye movements present. Does not blink to threat on either side. Fundi could not be visualized. Face is symmetric. Tongue is midline. BLE withdraw to pain but not against gravity, LUE flaccid 2-3/5 spontaneously. RUE at least 3/5 spontaneously. Left plantar upgoing right equivocal. Sensation and coordination cannot be reliably tested. Gait was not tested.   ASSESSMENT/PLAN Ms. Astrid Vides is a 80 y.o. female with history of altered mental status following UTI, seen in the ED 08/31/2014 - admitted 09/01/2014 with confusion. Patient was on coumadin at that time for atrial fibrillation. INR 1.84. Administered Levaquin. BP elevated on the 11th at 166/44, max BP next day 189/112. Pt INR trending up and 2.10 on 09/03/14. In retrospect, initial CT with L IVH. Repeat CT done during workup found to have worsening of IVH with bilateral occipital intraventricular blood with hydrocephalus. Hypertension and coumadin associated coagulapathy etiology of hemorrhage.   Stroke:  Non-dominant IVH secondary to coumadin associated coagulopathy in setting of uncontrolled hypertension.  Intraventricular drain placed with no improvement of mental status.  CSF without source of infection.   EVD removed  SCDs for VTE prophylaxis  Bedrest    warfarin prior to admission, no antithrombotics now given hemorrhage  Resultant confusion, unable to follow commands mental status disproportionate to amount of IVH and CSF pressure.  Therapy recommendations: SNF  Disposition:  SNF  AMS - likely due to IVH and hydrocephalus - LTM EEG did not detect seizure activity  - d/c vimpat and dilantin - arousal improved on amantadine - continue amantadine 200mg  bid   Atrial Fibrillation  Home meds:  Warfarin prior to admission  INR 1.51 on admission 10/12. High as 2.10 on 09/03/14.   No longer an anticoagulation candidate secondary to hemorrhage  Had wide complex tachycardia, cardiology consulted - see note  09/16/2014  Consider Afib with aberrancy/rate related bundle branch block.  Continue metoprolol 75mg  bid and cardiazem 30mg  q6   Continue lasix 40mg  daily  Continue on amiodarone  Repeat 2D echo unremarkable  High BNP but normal CEs  Cardiology recs appreciated.  On tele - strips reveal rate frequently > 100. May need to increase Cardizem or Lopressor. BP has room.  Dysphagia - NPO now - PANDA tube for tube feeding - PEG declined by daughter on Friday but now agreeable to placement - trauma surgery signed off on Friday. Alerted them of daughter's change of heart. They are unable to place until Thurs or Fri of this week. They are agreeable to IR consult for PEG placement (IR order placed) - Once PEG placed, will be medically ready for discharge to SNF  Hypertension  Stable, on the higher side  On metoprolol, increase  from 25mg  bid to 50mg  bid, and now 75mg  bid  Leukocytosis  - WBC down trending 13.2->11.9 ->11.0 -> 10.2 ->11.1 ->12.6->12.3 - cultures sent so far negative - vancon/zosyn 7 day course completed - afebrile - d/c daily labs  Hyperlipidemia  Home meds:  lipitor 80   LDL 110 goal < 70  D/c lipitor due to IVH  Other Stroke Risk Factors Advanced age . Obesity, Body mass index is 29.51 kg/(m^2).  Marland Kitchen. Coronary artery disease - s/p CABG ICA stenosis s/p CEA 2015 PVD  Other Active Problems  UTI with pyelonephritis - antibiotic course completed.   CKD   Anemia  C Diff specimen negative from 09/17/2014   Hospital day # [address]  SHARON BIBY, MSN, APRN, ANVP-BC, AGPCNP-BC [facility] Pager: [phone] 09/22/2014 1:40 PM  I have personally examined this patient, reviewed notes, independently viewed imaging studies, participated in medical decision making and plan of care. I have made any additions or clarifications directly to the above note. Agree with note above.for long discussion with the daughter at the bedside about the need for PEG to  maintain and supplement patient's nutritional needs prior to transferred for rehabilitation to skilled nursing facility. Daughter is in agreement and will sign consent form for PEG to be done hopefully tomorrow  Delia HeadyPramod Ninamarie Keel, MD Medical Director [facility] Pager: [phone] 09/22/2014 5:09 PM    To contact Stroke Continuity provider, please refer to [email or web address]. After hours, contact General Neurology

## 2014-09-22 NOTE — Progress Notes (Signed)
Speech Language Pathology Treatment: Dysphagia  Patient Details Name: Susan Calhoun MRN: [id number] DOB: 02/23/1928 Today's Date: 09/22/2014 Time: 1300-1330 SLP Time Calculation (min): 30 min  Assessment / Plan / Recommendation Clinical Impression  Pt's daughter present today. Pt awake, largely nonvocal, mitts in place. Daughter and SLP attempted to feed pt ice chips and puree (pudding).  Pt was noted to clamp lips tightly and not allow spoon to be placed in her mouth. SLP able to get 1 ice chip into her mouth. No overt s/s aspiration, however. Pt's daughter indicated they will be proceeding with PEG placement. She was encouraged that PEG placement does not prevent po intake if pt is safe and willing. ST to continue to follow pt during hospital stay. Depending on pt participation, continued ST after DC may be beneficial.   HPI HPI: Susan Calhoun is a 80 y.o. female with history of altered mental status following UTI, seen in the ED 08/31/2014 - admitted 09/01/2014 with confusion. Initial CT with L IVH. Repeat CT done during workup found to have worsening of IVH with bilateral occipital intraventricular blood with hydrocephalus. Hypertension and coumadin associated coagulapathy etiology of hemorrhage. Neuro surgery placed a Ventriculostomy drain for HCP. Since drain placement her mentation has not fully resolved. Repeat head CT shows no worsening of HCP or blood products. Questionable history of ventilation from 10/15 until? MD recommended PEG tube, family refused. Pt evaluated by SLP on 09/02/14, recommended to remain NPO due to cognitive deficits, pts arousal declined and SLP signed off.  BSE repeated 1-/30/15 with recommendation for NPO status. SLP in today for po trials. PEG placement pending.   Pertinent Vitals Pain Assessment: Faces Faces Pain Scale: No hurt  SLP Plan  Continue with current plan of care    Recommendations Diet recommendations: NPO Medication Administration: Via  alternative means              Oral Care Recommendations: Oral care Q4 per protocol Follow up Recommendations: Skilled Nursing facility Plan: Continue with current plan of care    GO   Celia B. Murvin NatalBueche, Lallie K[facility] Medical CenterMSP, CCC-SLP [phone]  Leigh AuroraBueche, Celia Brown 09/22/2014, 1:32 PM

## 2014-09-22 NOTE — Progress Notes (Signed)
NUTRITION FOLLOW-UP  INTERVENTION:  Continue Vital AF 1.2 @ 45 ml/hr  Provides 1296 kcal (99% of her minimum needs), 81 gm protein, 876 ml free water daily.  Recommend 105 ml free water every 6 hours  NUTRITION DIAGNOSIS: Inadequate oral intake related to inability to eat as evidenced by NPO status; ongoing.   Goal: Intake to meet >90% of estimated nutrition needs, met.  Monitor:  TF tolerance/adequacy, weight trend, labs  ASSESSMENT: Admitted to [facility] on 09/01/14 with working dx of UTI and acute encephalopathy. CT head was negative on admit. Had no improvement so MRI obtained on 10/14 showing: worsening hydrocephalus and layering material in the occipital horns of the lateral ventricles w/ question of hemorrhage raising concern for meningitis. Transferred to [facility] for further evaluation.   Vital AF 1.2 @ 45 ml/hr via NGT providing: 1296 kcal and 81 grams protein which is meeting needs at this time. Per nursing notes, recent residuals have been 0 ml. Pt denies any nausea or abdominal discomfort. Last BM 11/2. Pt's weight today was 144 lbs using the bed scale.  Per daughter at bedside, plan is for pt to go to surgery tomorrow to have PEG placed.   BUN elevated CBG's: 120-247 mg/dL Low Hemoglobin  Height: Ht Readings from Last 1 Encounters:  09/01/14 _0  (1.499 m)    Weight: Wt Readings from Last 1 Encounters:  09/15/14 146 lb 2.6 oz (66.3 kg)  Weight trends: 121-142 lb Pt positive 2.7 L since admission  BMI:  Body mass index is 29.51 kg/(m^2).  Estimated Nutritional Needs: Kcal: 1300-1500 Protein: 65-80 gm Fluid: 1.5 L  Skin: ecchymosis left hip, rash right buttocks  Diet Order:  NPO   Intake/Output Summary (Last 24 hours) at 09/22/14 1630 Last data filed at 09/22/14 1440  Gross per 24 hour  Intake      0 ml  Output   1350 ml  Net  -1350 ml    Last BM: 11/2  Labs:   Recent Labs Lab 09/16/14 0850  09/21/14 0746 09/21/14 1830 09/22/14 0611  NA  --    < > 145 146 144  K  --   < > 5.1 5.0 5.3  CL  --   < > 106 108 106  CO2  --   < > _1 BUN  --   < > 28* 30* 34*  CREATININE  --   < > 0.93 1.03 1.07  CALCIUM  --   < > 8.5 8.4 8.6  MG 2.0  --   --   --   --   PHOS 3.6  --   --   --   --   GLUCOSE  --   < > 160* 138* 145*  < > = values in this interval not displayed.  CBG (last 3)   Recent Labs  09/22/14 0055 09/22/14 0632 09/22/14 1105  GLUCAP 171* 152* 195*    Scheduled Meds: . amantadine  200 mg Oral BID  . amiodarone  200 mg Per Tube Daily  . antiseptic oral rinse  7 mL Mouth Rinse q12n4p  . chlorhexidine  15 mL Mouth Rinse BID  . diltiazem  30 mg Oral 4 times per day  . furosemide  40 mg Intravenous Daily  . insulin aspart  0-15 Units Subcutaneous Q6H  . latanoprost  1 drop Right Eye QHS  . metoprolol tartrate  75 mg Per Tube BID  . pantoprazole sodium  40 mg Per Tube Daily  Continuous Infusions: . sodium chloride 500 mL (09/20/14 1935)  . sodium chloride 30 mL/hr at 09/17/14 0700  . feeding supplement (VITAL AF 1.2 CAL) 1,000 mL (09/22/14 0042)   Pryor Ochoa RD, LDN Inpatient Clinical Dietitian Pager: [phone] After Hours Pager: [phone]

## 2014-09-22 NOTE — Progress Notes (Signed)
Talked to daughter at bedside, pt is confused; Daughter is agreeable to PEG placement; Alexis GoodellB Burnett Spray RN,BSN,MHA [phone]

## 2014-09-22 NOTE — Plan of Care (Signed)
Problem: Acute Treatment Outcomes Goal: Neuro exam at baseline or improved Outcome: Progressing     

## 2014-09-23 ENCOUNTER — Inpatient Hospital Stay (HOSPITAL_COMMUNITY): Payer: Medicare (Managed Care)

## 2014-09-23 DIAGNOSIS — E46 Unspecified protein-calorie malnutrition: Secondary | ICD-10-CM | POA: Insufficient documentation

## 2014-09-23 LAB — GLUCOSE, CAPILLARY
GLUCOSE-CAPILLARY: 120 mg/dL — AB (ref 70–99)
Glucose-Capillary: 117 mg/dL — ABNORMAL HIGH (ref 70–99)
Glucose-Capillary: 175 mg/dL — ABNORMAL HIGH (ref 70–99)
Glucose-Capillary: 202 mg/dL — ABNORMAL HIGH (ref 70–99)

## 2014-09-23 LAB — CLOSTRIDIUM DIFFICILE BY PCR: Toxigenic C. Difficile by PCR: NEGATIVE

## 2014-09-23 MED ORDER — FENTANYL CITRATE 0.05 MG/ML IJ SOLN
INTRAMUSCULAR | Status: AC
  Filled 2014-09-23: qty 2

## 2014-09-23 MED ORDER — GLUCAGON HCL RDNA (DIAGNOSTIC) 1 MG IJ SOLR
INTRAMUSCULAR | Status: AC
  Filled 2014-09-23: qty 1

## 2014-09-23 MED ORDER — FENTANYL CITRATE 0.05 MG/ML IJ SOLN
INTRAMUSCULAR | Status: AC | PRN
  Administered 2014-09-23 (×2): 25 ug via INTRAVENOUS

## 2014-09-23 MED ORDER — GLUCAGON HCL RDNA (DIAGNOSTIC) 1 MG IJ SOLR
INTRAMUSCULAR | Status: AC | PRN
  Administered 2014-09-23: 1 mg via INTRAVENOUS

## 2014-09-23 MED ORDER — IOHEXOL 300 MG/ML  SOLN
50.0000 mL | Freq: Once | INTRAMUSCULAR | Status: AC | PRN
  Administered 2014-09-23: 15 mL

## 2014-09-23 MED ORDER — MIDAZOLAM HCL 2 MG/2ML IJ SOLN
INTRAMUSCULAR | Status: AC
  Filled 2014-09-23: qty 2

## 2014-09-23 MED ORDER — CEFAZOLIN SODIUM-DEXTROSE 2-3 GM-% IV SOLR
2.0000 g | INTRAVENOUS | Status: AC
  Administered 2014-09-23: 2 g via INTRAVENOUS
  Filled 2014-09-23: qty 50

## 2014-09-23 MED ORDER — MIDAZOLAM HCL 2 MG/2ML IJ SOLN
INTRAMUSCULAR | Status: AC | PRN
  Administered 2014-09-23 (×2): 0.5 mg via INTRAVENOUS

## 2014-09-23 MED ORDER — LIDOCAINE HCL 1 % IJ SOLN
INTRAMUSCULAR | Status: AC
  Filled 2014-09-23: qty 20

## 2014-09-23 NOTE — Procedures (Signed)
Successful fluoroscopic guided insertion of gastrostomy tube without immediate post procedural complicatoin.   The gastrostomy tube may be used immediately for medications.  Otherwise, place gastrostomy tube to low wall suction for 24 hrs.  Tube feeds may be initiated in 24 hours as per the primary team.   

## 2014-09-23 NOTE — Progress Notes (Signed)
Physical Therapy Treatment Patient Details Name: Susan Calhoun MRN: [id number] DOB: 02/18/1928 Today's Date: 09/23/2014    History of Present Illness 80 yo wf admitted on 09/01/14 with MS changes. Initial CT showed some ventriculomegaly. Was being treated for UTI. MS changes worsened and an MRI was performed revelaed IVH, Ventricular drain placed on 09/04/14. PMHx-DM with neuropathy, CAD, CABG, afib, R TKA, glaucome    PT Comments    Pt continues to improve in alertness, ability to follow instructions, and functional mobility. Pt initiating standing once up to EOB and attempted with 2 person assist (unsuccessful). Noted plans for PEG today.   Follow Up Recommendations  SNF;Supervision/Assistance - 24 hour     Equipment Recommendations  None recommended by PT    Recommendations for Other Services       Precautions / Restrictions Precautions Precautions: Fall    Mobility  Bed Mobility Overal bed mobility: Needs Assistance Bed Mobility: Rolling;Sidelying to Sit;Sit to Sidelying Rolling: Max assist Sidelying to sit: Max assist;+2 for physical assistance     Sit to sidelying: Mod assist;+2 for physical assistance General bed mobility comments: pt turning her head and with max cues reaches for railing, then pulls on railing to assist with rolling and side to sit  Transfers Overall transfer level: Needs assistance Equipment used: 2 person hand held assist Transfers: Sit to/from Stand Sit to Stand: Max assist;+2 physical assistance         General transfer comment: unable to achieve full upright standing, however she did take weight on her legs  Ambulation/Gait                 Stairs            Wheelchair Mobility    Modified Rankin (Stroke Patients Only) Modified Rankin (Stroke Patients Only) Pre-Morbid Rankin Score: No symptoms Modified Rankin: Severe disability     Balance   Sitting-balance support: No upper extremity supported;Feet  unsupported Sitting balance-Leahy Scale: Poor Sitting balance - Comments: doesn't use her UEs to support herself, at one point able to maintain midline posture with minguard assist x 10 seconds; pt leaning forward and appearing to initiate standing: nodded yes when asked if she was trying to stand Postural control:  (anterior lean in sittin)                          Cognition Arousal/Alertness: Awake/alert Behavior During Therapy: Restless Overall Cognitive Status: Difficult to assess                      Exercises General Exercises - Lower Extremity Ankle Circles/Pumps: PROM;Both;5 reps (with heel cord stretch)    General Comments General comments (skin integrity, edema, etc.): more alert; tracking with her eyes and turning her head to track therapist around the room; stated one simple phrase during session; following commands with incr time      Pertinent Vitals/Pain Faces Pain Scale: No hurt    Home Living                      Prior Function            PT Goals (current goals can now be found in the care plan section) Acute Rehab PT Goals Patient Stated Goal: unable to state Progress towards PT goals: Progressing toward goals    Frequency  Min 2X/week    PT Plan Current plan remains appropriate    Co-evaluation  End of Session   Activity Tolerance: Patient limited by fatigue Patient left: in bed;with call bell/phone within reach;with bed alarm set;with restraints reapplied (bil mitts)     Time: [id number] PT Time Calculation (min): 20 min  Charges:  $Neuromuscular Re-education: 8-22 mins                    G Codes:      Willies Laviolette 09/23/2014, 1:05 PM Pager [phone]

## 2014-09-23 NOTE — Sedation Documentation (Signed)
Patient denies pain and is resting comfortably.  

## 2014-09-23 NOTE — Progress Notes (Signed)
Received order to insert flexiseal patient is very red and irritated applied barrier cream as well will continue to monitor.

## 2014-09-23 NOTE — Progress Notes (Signed)
STROKE TEAM PROGRESS NOTE   HISTORY Susan Calhoun is an 80 y.o. female who was noted by family to be acting confused > 2 weeks prior. Patient was brought to the hospital where she was diagnosed with a UTI. She was given Rocephin and sent home on Keflex. Due to not being awake enough to take her medications she was brought back to the hospital. MRI brain showed "layering material in the occipital horns of lateral ventricles, at least partly hemorrhagic. Question focal thrombus in the atrium of the right lateral ventricle, possibly associated with the choroid. This could be the site of hemorrhage". Infectious disease was consulted and they do not feel this is bacterial meningoencephalitis and ABX were D/C'd. Neuro surgery placed a Ventriculostomy drain for HCP. Since drain placement her mentation has not fully resolved. Repeat head CT shows no worsening of HCP or blood products. Neurology was asked to further evaluate. At time of evaluation, patient intubated on the vent, Temp 100.8, WBC 12.2. CSF: WBC 168, red blood cells 70000, lymph 7, neutrophile 85, glucose 78, protein 139   SUBJECTIVE (INTERVAL HISTORY) No family at bedside. More talkative this am than she has been.    OBJECTIVE Temp:  [97.5 F (36.4 C)-99.1 F (37.3 C)] 98.1 F (36.7 C) (11/03 1015) Pulse Rate:  [70-101] 70 (11/03 1015) Cardiac Rhythm:  [-] Atrial fibrillation (11/03 0755) Resp:  [18-20] 18 (11/03 1015) BP: (94-152)/(56-92) 134/77 mmHg (11/03 1015) SpO2:  [98 %-100 %] 98 % (11/03 1015)   Recent Labs Lab 09/22/14 1620 09/22/14 1720 09/23/14 0009 09/23/14 0538 09/23/14 1147  GLUCAP 138* 156* 202* 117* 120*    Recent Labs Lab 09/19/14 0758 09/20/14 0805 09/21/14 0746 09/21/14 1830 09/22/14 0611  NA 143 142 145 146 144  K 4.2 3.9 5.1 5.0 5.3  CL 106 104 106 108 106  CO2 25 26 27 25 25   GLUCOSE 89 141* 160* 138* 145*  BUN 28* 28* 28* 30* 34*  CREATININE 1.04 1.00 0.93 1.03 1.07  CALCIUM 8.2* 8.3* 8.5  8.4 8.6   No results for input(s): AST, ALT, ALKPHOS, BILITOT, PROT, ALBUMIN in the last 168 hours.  Recent Labs Lab 09/17/14 0458 09/18/14 0536 09/19/14 0758 09/21/14 0746 09/22/14 0611  WBC 10.2 11.1* 12.6* 12.7* 12.3*  HGB 8.3* 8.9* 8.8* 9.8* 10.3*  HCT 25.9* 28.0* 27.7* 30.6* 32.8*  MCV 92.2 94.9 93.6 95.3 97.0  PLT 245 266 266 320 269    Recent Labs Lab 09/16/14 1430  TROPONINI <0.30   No results for input(s): LABPROT, INR in the last 72 hours. No results for input(s): COLORURINE, LABSPEC, PHURINE, GLUCOSEU, HGBUR, BILIRUBINUR, KETONESUR, PROTEINUR, UROBILINOGEN, NITRITE, LEUKOCYTESUR in the last 72 hours.  Invalid input(s): APPERANCEUR     Component Value Date/Time   CHOL 204* 09/02/2014 0530   TRIG 149 09/02/2014 0530   HDL 64 09/02/2014 0530   CHOLHDL 3.2 09/02/2014 0530   VLDL 30 09/02/2014 0530   LDLCALC 110* 09/02/2014 0530   Lab Results  Component Value Date   HGBA1C 5.6 09/01/2014    Ct Head Wo Contrast 09/14/2014  1. Stable appearance of hydrocephalus involving the lateral and third ventricles with some residual layering blood products. 2. Persisting gas within the frontal horn of the right lateral ventricle following removal of the ventriculostomy catheter. 3. Stable mild diffuse white matter disease without evidence for acute infarct or significant interval change. 4. Stable focal hemorrhage within the left cerebral peduncle. 09/11/2014   Normal ventricular enlargement is stable. Intraventricular hemorrhage  slightly improved. Small area of hemorrhage or calcification in the right parietal lobe is stable.  09/08/2014  1. Unchanged dilatation of the third ventricle and likely no significant interval change in dilatation of the lateral ventricles. 2. Unchanged intraventricular blood. 3. Small volume of subarachnoid blood.    09/05/2014   1. Interval placement of right frontal approach ventriculostomy catheter with tip in the third ventricle. Ventricles remain  dilated but are slightly decreased in size relative to previous MRI from 09/03/2014. 2. Layering hemorrhage within the occipital horns of the lateral ventricles bilaterally, stable from prior. 3. Question trace acute subarachnoid hemorrhage within the right sylvian fissure.     08/31/2014  Mild, likely age-appropriate, centralized volume loss without acute intracranial process.     [name] Contrast 09/12/14 Diffuse ventricular enlargement unchanged from CT scan yesterday. Findings suggest communicating hydrocephalus. Interval removal ventricular catheter with gas now in the right frontal horn. Intraventricular hemorrhage and subarachnoid hemorrhage appears stable. Negative for acute ischemic infarct. 09/03/2014    Acute hydrocephalus, worsened since 3 days ago. Layering material in the occipital horns of lateral ventricles, at least partly hemorrhagic. Question focal thrombus in the atrium of the right lateral ventricle, possibly associated with the choroid. This could be the site of hemorrhage. No specific vascular lesion is identified. No intraparenchymal hemorrhage. The differential diagnosis does include meningitis.    Dg Chest Port 1 View 09/17/2014 Central vascular congestion without convincing pulmonary edema. Persistent small bilateral pleural effusion with bilateral basilar atelectasis or infiltrate. Status post median sternotomy. Stable NG tube and left IJ central line position. 09/14/2014  Stable cardiomegaly with vascular congestion. Persistent mild bibasilar atelectasis, less likely airspace disease. Small pleural effusions. 09/13/2014  Increasing bibasilar atelectasis or infiltrates. Suspect small effusions. 09/06/2014   Feeding tube with weighted tip in stomach.   09/04/2014    New left jugular central venous catheter tip overlies the distal SVC. No evidence of pneumothorax.  Stable cardiomegaly.  No active lung disease.    08/31/2014    Borderline cardiomegaly and lung  hyperexpansion without acute cardiopulmonary disease.    EEG 09/11/14 : This is a markedly abnormal electroencephalogram secondary to general background slowing with frequent intermittent discharges of triphasic morphology. Although this may be seen with a severe metabolic encephalopathy can not rule out the possibility that this may represent epileptic phenomenon. Clinical correlation recommended.   2D echo 09/17/14 - Left ventricle: The cavity size was normal. Wall thickness was normal. Systolic function was normal. The estimated ejection fraction was in the range of 50% to 55%. Wall motion was normal; there were no regional wall motion abnormalities.  - Mitral valve: Calcified annulus. There was moderateregurgitation. - Left atrium: The atrium was moderately dilated. - Pulmonary arteries: PA peak pressure: 35 mm Hg (S).  Carotid Doppler  09/03/2014  - Mild technical difficulty due to movement of the arms and neck. - Right - 1% to 39% ICA stenosis. Vertebral artery flow is antegrade. - Left - 40% to 59% ICA stensosis. Vertebral artery flow isantegrade.   PHYSICAL EXAM Frail elderly Caucasian lady not in distress. Afebrile. Head is nontraumatic. Neck is supple without bruit.  Cardiac exam no murmur or gallop, but tachycardia. Lungs are clear to auscultation. Distal pulses are well felt. Neurological Exam: The patient is laying in bed with eyes open. She is   responsive today. She did state that she is doing okay;  follow command x 1 RUE. She focuses and tracks. Pupils exam showed left surgical  pupil, left sluggish to light. Slight dysconjugate gaze with left eye hypotropia. Roving eye movements present. Does not blink to threat on either side. Fundi could not be visualized. Face is symmetric. Tongue is midline. BLE withdraw to pain but not against gravity, LUE flaccid 2-3/5 spontaneously. RUE at least 3/5 spontaneously. Left plantar upgoing right equivocal. Sensation and coordination cannot be  reliably tested. Gait was not tested.   ASSESSMENT/PLAN [name] is a 79 y.o. female with history of altered mental status following UTI, seen in the ED 08/31/2014 - admitted 09/01/2014 with confusion. Patient was on coumadin at that time for atrial fibrillation. INR 1.84. Administered Levaquin. BP elevated on the 11th at 166/44, max BP next day 189/112. Pt INR trending up and 2.10 on 09/03/14. In retrospect, initial CT with L IVH. Repeat CT done during workup found to have worsening of IVH with bilateral occipital intraventricular blood with hydrocephalus. Hypertension and coumadin associated coagulapathy etiology of hemorrhage.   Stroke:  Non-dominant IVH secondary to coumadin associated coagulopathy in setting of uncontrolled hypertension.  Intraventricular drain placed with no improvement of mental status.  CSF without source of infection.   EVD removed  SCDs for VTE prophylaxis  Up with assistance   warfarin prior to admission, no antithrombotics now given hemorrhage  Resultant confusion, unable to follow commands mental status disproportionate to amount of IVH and CSF pressure.  Therapy recommendations: SNF  Disposition:  SNF  AMS  - likely due to IVH and hydrocephalus  - LTM EEG did not detect seizure activity   - d/c vimpat and dilantin  - arousal improved on amantadine  - continue amantadine 200mg  bid   Atrial Fibrillation  Home meds:  Warfarin prior to admission  INR 1.51 on admission 10/12. High as 2.10 on 09/03/14.   No longer an anticoagulation candidate secondary to hemorrhage  Had wide complex tachycardia, cardiology consulted - see note 09/16/2014  Consider Afib with aberrancy/rate related bundle branch block.  Continue metoprolol 75mg  bid and cardiazem 30mg  q6   Continue lasix 40mg  daily  Continue on amiodarone  Repeat 2D echo unremarkable  High BNP but normal CEs  Cardiology recs appreciated.  D/c tele.  Dysphagia  - NPO  now  - PANDA tube for tube feeding  - PEG declined by daughter on Friday but now agreeable to placement  - trauma surgery signed off on Friday. Alerted them of daughter's change of heart. They are unable to place until Thurs or Fri of this week. They are agreeable to IR consult for PEG placement (IR order placed). Hope it will be placed today.  - Once PEG placed, will be medically ready for discharge to SNF  Hypertension  Stable, on the higher side  On metoprolol, increase from 25mg  bid to 50mg  bid, and now 75mg  bid  Leukocytosis   - WBC down trending 13.2->11.9 ->11.0 -> 10.2 ->11.1 ->12.6->12.3  - cultures sent so far negative  - vancon/zosyn 7 day course completed  - afebrile  - d/c daily labs  Hyperlipidemia  Home meds:  lipitor 80   LDL 110 goal < 70  D/c lipitor due to IVH  Other Stroke Risk Factors Advanced age . Obesity, Body mass index is 29.51 kg/(m^2).  Marland Kitchen Coronary artery disease - s/p CABG ICA stenosis s/p CEA 2015 PVD  Other Active Problems  UTI with pyelonephritis - antibiotic course completed.   CKD   Anemia  C Diff specimen negative from 09/17/2014   Hospital day # 22  SHARON  BIBY, MSN, APRN, ANVP-BC, AGPCNP-BC [facility] Pager: [phone] 09/23/2014 12:38 PM  I have personally examined this patient, reviewed notes, independently viewed imaging studies, participated in medical decision making and plan of care. I have made any additions or clarifications directly to the above note. Agree with note above. Await peg tube today .Hopefully SNF dc in a few days later Delia HeadyPramod Avery Eustice, MD Medical Director [facility] Pager: [phone] 09/23/2014 2:42 PM     To contact Stroke Continuity provider, please refer to [email or web address]. After hours, contact General Neurology

## 2014-09-23 NOTE — H&P (Signed)
Chief Complaint: Chief Complaint  Patient presents with  . Altered Mental Status  . Urinary Tract Infection  Protein calorie malnutrition Dysphagia  Referring Physician(s): Dr Pearlean Brownie  History of Present Illness: Susan Calhoun is a 80 y.o. female  Pt admitted 09/01/14: Altered mental status Treated for UTI Encephalopathy did not resolve- worsened CT/MRI workup revealed hydrocephalus and layering: hemorrhage Recent CT/MRI shows stable Intraventricular hemorrhage/ subarachnoid hemorrhage Pt non vocal; confused; in restraints Plan for SNF- long term care Dysphagia; failed swallow studies Protein calorie malnutrition Off coumadin secondary hemorrhage (hx afib) Request made for IR consult for percutaneous gastric tube placement Dr Grace Isaac has reviewed chart and imaging Recommended: CT Abd w/o contrast to evaluate anatomy Will review this CT scan asap Plan for G tube placement in IR if approved by Dr Grace Isaac  Past Medical History  Diagnosis Date  . Diabetes mellitus with diabetic polyneuropathy   . A-fib   . Renal disorder   . Hypertension   . Anemia   . Glaucoma     right eye   . Carotid artery stenosis   . PVD (peripheral vascular disease)     Past Surgical History  Procedure Laterality Date  . Replacement total knee Right   . Carotid endarterectomy  2015  . Coronary artery bypass graft    . Ablation      cardiac for arrthymia     Allergies: Septra  Medications: Prior to Admission medications   Medication Sig Start Date End Date Taking? Authorizing Provider  acetaminophen (TYLENOL) 500 MG tablet Take 1,000 mg by mouth 3 (three) times daily as needed (pain).   Yes Historical Provider, MD  allopurinol (ZYLOPRIM) 100 MG tablet Take 100 mg by mouth 2 (two) times daily.    Yes Historical Provider, MD  aspirin EC 81 MG tablet Take 81 mg by mouth daily with breakfast.    Yes Historical Provider, MD  atorvastatin (LIPITOR) 80 MG tablet Take 80 mg by mouth daily after  supper.    Yes Historical Provider, MD  carvedilol (COREG) 25 MG tablet Take 25 mg by mouth 2 (two) times daily with a meal.   Yes Historical Provider, MD  cephALEXin (KEFLEX) 500 MG capsule Take 1 capsule (500 mg total) by mouth 4 (four) times daily. 08/31/14  Yes Pilar Jarvis, MD  cholecalciferol (VITAMIN D) 1000 UNITS tablet Take 1,000 Units by mouth every other day.   Yes Historical Provider, MD  Cyanocobalamin (VITAMIN B-12 PO) Take 1 tablet by mouth every other day.   Yes Historical Provider, MD  ferrous sulfate 325 (65 FE) MG tablet Take 325 mg by mouth daily with breakfast.   Yes Historical Provider, MD  furosemide (LASIX) 20 MG tablet Take 20 mg by mouth 2 (two) times daily.    Yes Historical Provider, MD  lisinopril (PRINIVIL,ZESTRIL) 5 MG tablet Take 5 mg by mouth daily with breakfast.    Yes Historical Provider, MD  metFORMIN (GLUCOPHAGE) 500 MG tablet Take 500 mg by mouth daily with breakfast.    Yes Historical Provider, MD  NIFEdipine (NIFEDICAL XL) 60 MG 24 hr tablet Take 60 mg by mouth daily after supper.    Yes Historical Provider, MD  Travoprost, BAK Free, (TRAVATAN) 0.004 % SOLN ophthalmic solution Place 1 drop into the right eye at bedtime.   Yes Historical Provider, MD  warfarin (COUMADIN) 5 MG tablet Take 5-10 mg by mouth daily after supper. Take 2 tablets (10 mg) on Monday, then take 1 tablet (5 mg) on Tuesday  thru Sunday   Yes Historical Provider, MD    History reviewed. No pertinent family history.  History   Social History  . Marital Status: Single    Spouse Name: N/A    Number of Children: N/A  . Years of Education: N/A   Social History Main Topics  . Smoking status: Never Smoker   . Smokeless tobacco: None  . Alcohol Use: No  . Drug Use: No  . Sexual Activity: No   Other Topics Concern  . None   Social History Narrative    Review of Systems: A 12 point ROS discussed and pertinent positives are indicated in the HPI above.  All other systems are  negative.  Review of Systems  Constitutional: Positive for activity change, appetite change and fatigue. Negative for fever.  Respiratory: Negative for cough.   Neurological: Positive for weakness.  Psychiatric/Behavioral: Positive for agitation.    Vital Signs: BP 145/92 mmHg  Pulse 73  Temp(Src) 97.8 F (36.6 C) (Oral)  Resp 18  Ht 4\' 11"  (1.499 m)  Wt 66.3 kg (146 lb 2.6 oz)  BMI 29.51 kg/m2  SpO2 98%  Physical Exam  Constitutional: She appears well-developed.  Cardiovascular: Normal rate and regular rhythm.   No murmur heard. Pulmonary/Chest: Effort normal and breath sounds normal. She has no wheezes.  Abdominal: Soft. Bowel sounds are normal.  Musculoskeletal:  Pt moves all 4s  Does not follow commands Non verbal  Skin: Skin is dry.  Psychiatric:  Consented dtr over phone    Imaging: Dg Chest 2 View  08/31/2014   CLINICAL DATA:  Disoriented. History of atrial fibrillation. Initial encounter.  EXAM: CHEST  2 VIEW  COMPARISON:  None.  FINDINGS: Borderline enlarged cardiac silhouette and mediastinal contours with atherosclerotic plaque within the thoracic aorta. Post median sternotomy. Evaluation retrosternal clear space obscured secondary to overlying soft tissues. The lungs appear mildly hyperexpanded with mild diffuse slightly nodular thickening of the pulmonary interstitium. No focal airspace opacities. No pleural effusion or pneumothorax. No evidence of edema. No acute osseus abnormalities.  IMPRESSION: Borderline cardiomegaly and lung hyperexpansion without acute cardiopulmonary disease.   Electronically Signed   By: Simonne Come M.D.   On: 08/31/2014 21:03   Ct Head Wo Contrast  09/14/2014   CLINICAL DATA:  Intraventricular hemorrhage. Altered mental status. Encephalopathy.  EXAM: CT HEAD WITHOUT CONTRAST  TECHNIQUE: Contiguous axial images were obtained from the base of the skull through the vertex without intravenous contrast.  COMPARISON:  CT head 09/11/2014 and  MRI brain 09/12/2014.  FINDINGS: The right frontal ventriculostomy catheter has been removed. There is some residual edema along the ventriculostomy tract. Ventricular size is stable is measured across the frontal horns. Third ventricular size is stable is well there is some blood layering within the lateral ventricles bilaterally. The fourth ventricle is more normal in size.  Periventricular white matter changes are stable. A focal density in the right parietal lobe is unchanged. This likely represents a small calcification given its stability over time and lack of significant artifact on the MRI. Focal hemorrhage within the left cerebral peduncle is stable.  IMPRESSION: 1. Stable appearance of hydrocephalus involving the lateral and third ventricles with some residual layering blood products. 2. Persisting gas within the frontal horn of the right lateral ventricle following removal of the ventriculostomy catheter. 3. Stable mild diffuse white matter disease without evidence for acute infarct or significant interval change. 4. Stable focal hemorrhage within the left cerebral peduncle.   Electronically Signed  By: Gennette Pachris  Mattern M.D.   On: 09/14/2014 11:13   Ct Head Wo Contrast  09/11/2014   CLINICAL DATA:  Hydrocephalus.  Clamped intraventricular catheter.  EXAM: CT HEAD WITHOUT CONTRAST  TECHNIQUE: Contiguous axial images were obtained from the base of the skull through the vertex without intravenous contrast.  COMPARISON:  CT 09/08/2014  FINDINGS: Right frontal ventricular catheter extends into the third ventricle unchanged in position. Hydrocephalus of the third fourth and lateral ventricles is unchanged. Layering high density blood in the occipital horns bilaterally slightly improved. No new hemorrhage in the ventricles  Small 5 mm hyperdensity in the right parietal lobe may represent some subarachnoid hemorrhage or calcification. This is unchanged over serial exams.  IMPRESSION: Normal ventricular  enlargement is stable. Intraventricular hemorrhage slightly improved. Small area of hemorrhage or calcification in the right parietal lobe is stable.   Electronically Signed   By: Marlan Palauharles  Clark M.D.   On: 09/11/2014 08:02   Ct Head Wo Contrast  09/08/2014   CLINICAL DATA:  Altered mental status with acute encephalopathy and somnolence. Hydrocephalus on recent imaging.  EXAM: CT HEAD WITHOUT CONTRAST  TECHNIQUE: Contiguous axial images were obtained from the base of the skull through the vertex without intravenous contrast.  COMPARISON:  Head CT 09/04/2014  FINDINGS: Images are mildly to moderately degraded by motion artifact.  Right frontal approach ventriculostomy catheter remains in place terminating in the third ventricle, unchanged. Dilatation of the third ventricle has not significantly changed, measuring approximately 1.5 cm in diameter. Slightly increased dilatation of the temporal horns of the lateral ventricles is questioned, however apparent differences may be due to differences in angulation compared with the prior CT. The temporal horns do not appear significantly changed in size compared to the 09/03/2014 MRI. Small amount of blood layering in the occipital horns of lateral ventricles does not appear significantly changed. Trace subarachnoid blood is suspected in a right parietal sulcus (series 2, image 18). There is also likely a small amount of subarachnoid blood in the posterior aspects of the right greater than left sylvian fissures. There is no evidence of acute large territory cortical infarct. There is no midline shift or mass.  Prior bilateral cataract extraction is noted. There is minimal right and mild left maxillary sinus mucosal thickening. Mild sphenoid sinus mucosal thickening is also noted. Mastoid air cells are clear. Carotid siphon calcification is noted. Enteric tube is partially visualized.  IMPRESSION: 1. Unchanged dilatation of the third ventricle and likely no significant  interval change in dilatation of the lateral ventricles. 2. Unchanged intraventricular blood. 3. Small volume of subarachnoid blood.   Electronically Signed   By: Sebastian AcheAllen  Grady   On: 09/08/2014 11:02   Ct Head Wo Contrast  09/05/2014   CLINICAL DATA:  Follow-up examination for hydrocephalus.  EXAM: CT HEAD WITHOUT CONTRAST  TECHNIQUE: Contiguous axial images were obtained from the base of the skull through the vertex without intravenous contrast.  COMPARISON:  Prior MRI from 09/03/2014 and CT from 08/31/2014  FINDINGS: There has been interval placement of a right frontal approach ventriculostomy catheter with tip in the third ventricle. The ventricles remain dilated, however, overall ventricular size is slightly decreased relative to prior study. There is layering hemorrhage within the occipital horns of both lateral ventricles, grossly similar to previous. No definite new intracranial hemorrhage. Faint linear hyperdensity at the right sylvian fissure may represent a small amount of subarachnoid hemorrhage (series 4, image 8).  No acute large vessel territory infarct. No midline shift.  Basilar cisterns remain patent. No extra-axial fluid collection.  Calvarium intact. Scalp soft tissues within normal limits. No acute abnormality seen about either orbit.  Nasogastric tube partially visualized. There is mild opacity within the sphenoid sinuses bilaterally with scattered mucoperiosteal thickening within the maxillary sinuses. No mastoid effusion.  IMPRESSION: 1. Interval placement of right frontal approach ventriculostomy catheter with tip in the third ventricle. Ventricles remain dilated but are slightly decreased in size relative to previous MRI from 09/03/2014. 2. Layering hemorrhage within the occipital horns of the lateral ventricles bilaterally, stable from prior. 3. Question trace acute subarachnoid hemorrhage within the right sylvian fissure.   Electronically Signed   By: Rise MuBenjamin  McClintock M.D.   On:  09/05/2014 00:35   Ct Head Wo Contrast  08/31/2014   CLINICAL DATA:  Disoriented.  Initial encounter.  EXAM: CT HEAD WITHOUT CONTRAST  TECHNIQUE: Contiguous axial images were obtained from the base of the skull through the vertex without intravenous contrast.  COMPARISON:  None.  FINDINGS: There is mild, likely age-appropriate, centralized volume loss with mild ex vacuo dilatation of the ventricular system. Gray-white differentiation is maintained without CT evidence of acute large territory infarct. No intraparenchymal or extra-axial mass or hemorrhage. Intracranial atherosclerosis. Limited visualization of the paranasal sinuses and mastoid air cells is normal. No air-fluid levels. Note is made of mild hyperostosis frontalis. Post bilateral cataract surgery. Regional soft tissues appear normal. No displaced calvarial fracture.  IMPRESSION: Mild, likely age-appropriate, centralized volume loss without acute intracranial process.   Electronically Signed   By: Simonne ComeJohn  Watts M.D.   On: 08/31/2014 21:01   [name] W GNWo Contrast  09/12/2014   CLINICAL DATA:  Encephalopathy.  Ventriculostomy drain removal  EXAM: MRI HEAD WITHOUT AND WITH CONTRAST  TECHNIQUE: Multiplanar, multiecho pulse sequences of the brain and surrounding structures were obtained without and with intravenous contrast.  CONTRAST:  13mL MULTIHANCE GADOBENATE DIMEGLUMINE 529 MG/ML IV SOLN  COMPARISON:  CT head 09/11/2014  FINDINGS: Interval removal of right ventricular drain catheter. There is now gas in the right frontal horn which was not prepped yesterday. Diffuse ventricular enlargement is unchanged. There is blood layering in the occipital horns bilaterally. Small amount of blood also present in the left midbrain adjacent to the previous location of the tip of the catheter. There is mild subarachnoid hemorrhage.  Negative for ischemic acute infarct. Negative for mass lesion. No shift of the midline structures.  Postcontrast imaging reveals no  enhancing mass lesion. Leptomeningeal enhancement is normal.  IMPRESSION: Diffuse ventricular enlargement unchanged from CT scan yesterday. Findings suggest communicating hydrocephalus. Interval removal ventricular catheter with gas now in the right frontal horn.  Intraventricular hemorrhage and subarachnoid hemorrhage appears stable.  Negative for acute ischemic infarct.   Electronically Signed   By: Marlan Palauharles  Clark M.D.   On: 09/12/2014 13:24   [name] W FAWo Contrast  09/03/2014   CLINICAL DATA:  New onset altered mental status. Disorientation. Incontinence. Acute encephalopathy. Somnolence.  EXAM: MRI HEAD WITHOUT AND WITH CONTRAST  TECHNIQUE: Multiplanar, multiecho pulse sequences of the brain and surrounding structures were obtained without and with intravenous contrast.  CONTRAST:  13mL MULTIHANCE GADOBENATE DIMEGLUMINE 529 MG/ML IV SOLN  COMPARISON:  Head CT 08/31/2014  FINDINGS: Diffusion imaging does not show any acute or subacute infarction.  There is hydrocephalus of the lateral and third ventricles. There is layering material in the occipital forms of both lateral ventricles. This shows some susceptibility suggesting that it may be in part hemorrhagic. There appears  to be slightly more susceptibility in the region of the atrium of the right lateral ventricle. This could be a focal collide or could indicate that this site of hemorrhage could be the choroid in that region. I think the ventricles are slightly more prominent than were seen on the CT scan of 3 days ago. The brain parenchyma shows minimal small vessel change of the deep white matter. Some of the periventricular signal intensity could relate to transependymal resorption of CSF. After contrast administration, no abnormal enhancement occurs. No evidence of mass lesion. No extra-axial collection. No pituitary mass. No inflammatory sinus disease. No skull or skullbase lesion.  IMPRESSION: Acute hydrocephalus, worsened since 3 days ago. Layering  material in the occipital horns of lateral ventricles, at least partly hemorrhagic. Question focal thrombus in the atrium of the right lateral ventricle, possibly associated with the choroid. This could be the site of hemorrhage. No specific vascular lesion is identified. No intraparenchymal hemorrhage. The differential diagnosis does include meningitis.  I am in the process of contacting the clinical team to discuss this result. Pages are pending at this time.   Electronically Signed   By: Paulina Fusi M.D.   On: 09/03/2014 13:18   Dg Chest Port 1 View  09/17/2014   CLINICAL DATA:  Congestive heart failure  EXAM: PORTABLE CHEST - 1 VIEW  COMPARISON:  09/14/2014  FINDINGS: Cardiomegaly again noted. Central vascular congestion without convincing pulmonary edema. Persistent small bilateral pleural effusion with bilateral basilar atelectasis or infiltrate. Status post median sternotomy. Stable NG tube and left IJ central line position.  IMPRESSION: Central vascular congestion without convincing pulmonary edema. Persistent small bilateral pleural effusion with bilateral basilar atelectasis or infiltrate. Status post median sternotomy. Stable NG tube and left IJ central line position.   Electronically Signed   By: Natasha Mead M.D.   On: 09/17/2014 09:25   Dg Chest Port 1 View  09/14/2014   CLINICAL DATA:  Shortness of breath  EXAM: PORTABLE CHEST - 1 VIEW  COMPARISON:  09/13/2012  FINDINGS: Stable support apparatus. Previous coronary bypass noted. Mild cardiomegaly with persistent vascular congestion and small effusions. Stable basilar opacities, favor atelectasis over pneumonia. No pneumothorax. Degenerative changes of the spine.  IMPRESSION: Stable cardiomegaly with vascular congestion  Persistent mild bibasilar atelectasis, less likely airspace disease.  Small pleural effusions.   Electronically Signed   By: Ruel Favors M.D.   On: 09/14/2014 08:50   Dg Chest Port 1 View  09/13/2014   CLINICAL DATA:   Fever.  EXAM: PORTABLE CHEST - 1 VIEW  COMPARISON:  09/06/2014  FINDINGS: Left central line and feeding tube remain in place, unchanged. Cardiomegaly with vascular congestion. Bilateral lower lobe airspace opacities have increased since prior study. Cannot exclude small bilateral effusions. No acute bony abnormality.  IMPRESSION: Increasing bibasilar atelectasis or infiltrates. Suspect small effusions.   Electronically Signed   By: Charlett Nose M.D.   On: 09/13/2014 01:44   Dg Chest Port 1 View  09/06/2014   CLINICAL DATA:  Check endotracheal tube  EXAM: PORTABLE CHEST - 1 VIEW  COMPARISON:  Radiograph 09/04/2014  FINDINGS: No endotracheal tube present. Feeding tube extends into the stomach with weighted tip in the gastric antrum. Central venous line is unchanged. Stable mildly enlarged cardiac silhouette. No pneumothorax. No pulmonary edema.  IMPRESSION: Feeding tube with weighted tip in stomach.   Electronically Signed   By: Genevive Bi M.D.   On: 09/06/2014 07:43   Dg Chest [facility]  09/04/2014   CLINICAL DATA:  Central line placement. Atrial fibrillation. Coronary artery disease.  EXAM: PORTABLE CHEST - 1 VIEW  COMPARISON:  08/31/2014  FINDINGS: A new left internal jugular central venous catheter is seen with tip in the distal SVC. No evidence of pneumothorax.  Cardiomegaly is stable. Both lungs remain clear. No evidence of pleural effusion.  IMPRESSION: New left jugular central venous catheter tip overlies the distal SVC. No evidence of pneumothorax.  Stable cardiomegaly.  No active lung disease.   Electronically Signed   By: Myles Rosenthal M.D.   On: 09/04/2014 16:35   Dg Abd Portable 1v  09/20/2014   CLINICAL DATA:  Feeding tube placement.  EXAM: PORTABLE ABDOMEN - 1 VIEW  COMPARISON:  09/09/14  FINDINGS: A feeding tube is now seen with the tip overlying the proximal body of the stomach. No evidence of dilated bowel loops.  IMPRESSION: Feeding tube tip overlies the proximal body of the  stomach.   Electronically Signed   By: Myles Rosenthal M.D.   On: 09/20/2014 19:20   Dg Abd Portable 1v  09/09/2014   CLINICAL DATA:  80 year old female, status post enteric tube placement.  EXAM: PORTABLE ABDOMEN - 1 VIEW  COMPARISON:  Plain film 09/04/2014  FINDINGS: Limited view the abdomen with exclusion of portions of the right abdomen.  Metallic tip enteric feeding tube projects over the lower mediastinum, following the greater curvature of the stomach, and terminating near the pylorus. Coaxial wire remains in place.  Unremarkable bowel gas pattern with gas within colon. No significant small bowel gas.  Surgical changes at the left lower chest, as well as changes of prior median sternotomy.  Extensive vascular calcifications.  Multilevel degenerative disc disease.  IMPRESSION: Limited plain film of the abdomen demonstrates metallic tip enteric feeding tube in place, with the tip terminating near the gastric antrum/pylorus.  Signed,  Yvone Neu. Loreta Ave, DO  [facility]  [facility]   Electronically Signed   By: Gilmer Mor D.O.   On: 09/09/2014 16:46   Dg Abd Portable 1v  09/04/2014   CLINICAL DATA:  Feeding tube placement.  EXAM: PORTABLE ABDOMEN - 1 VIEW  COMPARISON:  None.  FINDINGS: The feeding tube tip projects in the mid stomach. Included bowel gas pattern is nondilated and nonobstructive. Limited view of the lung bases are clear. Moderate degenerative changes of the spine.  IMPRESSION: Feeding tube tip projects in mid stomach.   Electronically Signed   By: Awilda Metro   On: 09/04/2014 19:54    Labs:  CBC:  Recent Labs  09/18/14 0536 09/19/14 0758 09/21/14 0746 09/22/14 0611  WBC 11.1* 12.6* 12.7* 12.3*  HGB 8.9* 8.8* 9.8* 10.3*  HCT 28.0* 27.7* 30.6* 32.8*  PLT 266 266 320 269    COAGS:  Recent Labs  09/03/14 2245 09/04/14 0243 09/05/14 0545 09/06/14 0500  INR 1.46 1.22 1.11 1.11  APTT 30  --   --   --      BMP:  Recent Labs  09/20/14 0805 09/21/14 0746 09/21/14 1830 09/22/14 0611  NA 142 145 146 144  K 3.9 5.1 5.0 5.3  CL 104 106 108 106  CO2 26 27 25 25   GLUCOSE 141* 160* 138* 145*  BUN 28* 28* 30* 34*  CALCIUM 8.3* 8.5 8.4 8.6  CREATININE 1.00 0.93 1.03 1.07  GFRNONAA 50* 55* 49* 46*  GFRAA 58* 64* 56* 54*    LIVER FUNCTION TESTS:  Recent Labs  08/31/14 1807  09/01/14 1648 09/02/14 0530  BILITOT 0.3 0.5 0.4  AST 18 22 19   ALT 16 17 15   ALKPHOS 73 81 71  PROT 7.5 8.1 6.8  ALBUMIN 3.5 3.5 2.9*    TUMOR MARKERS: No results for input(s): AFPTM, CEA, CA199, CHROMGRNA in the last 8760 hours.  Assessment and Plan:  AMS SAH/IVH Encephalopathy Dysphagia; failed swallow studies PCM Plan for SNF- long term care Scheduled for percutaneous gastric tube possibly today Will check CT Abd w/o contrast stat Afeb No thinners  Thank you for this interesting consult.  I greatly enjoyed meeting Teaghan Melrose and look forward to participating in their care.    I spent a total of 40 minutes face to face in clinical consultation, greater than 50% of which was counseling/coordinating care for percutaneous gastric tube placement  Signed: Britiney Blahnik A 09/23/2014, 8:56 AM

## 2014-09-23 NOTE — Sedation Documentation (Signed)
G-tube in place with drain dressing, C/D/I at this time.

## 2014-09-24 LAB — GLUCOSE, CAPILLARY
GLUCOSE-CAPILLARY: 115 mg/dL — AB (ref 70–99)
GLUCOSE-CAPILLARY: 93 mg/dL (ref 70–99)
Glucose-Capillary: 84 mg/dL (ref 70–99)
Glucose-Capillary: 131 mg/dL — ABNORMAL HIGH (ref 70–99)
Glucose-Capillary: 117 mg/dL — ABNORMAL HIGH (ref 70–99)
Glucose-Capillary: 111 mg/dL — ABNORMAL HIGH (ref 70–99)

## 2014-09-24 NOTE — Discharge Summary (Signed)
Stroke Discharge Summary  Patient ID: Susan Calhoun    l   MRN: [id number]      DOB: 02/15/1928  Date of Admission: 09/01/2014 Date of Discharge: 09/30/2014  Attending Physician:  Marvel PlanJindong Megahn Killings, MD, Stroke MD  Consulting Physician(s):   Rory Percyaniel  Feinstein, MD (pulmonary/intensive care), Melba Coonornelius Van Damm, MD (ID), Tia Alertavid S Jones, MD (neurosurgery), Verdis PrimeHenry Smith, MD (cardiology), and Frederik SchmidtJay Wyatt, MD (general surgery)  Patient's PCP:  Pcp Not In System  DISCHARGE DIAGNOSIS:   Stroke: Non-dominant IVH secondary to coumadin associated coagulopathy in setting of uncontrolled hypertension.  Altered Mental Status likely due to IVH and hydrocephalus  Atrial Fibrillation  Coumadin induced coagulopathy-resolved  Dysphagia secondary to IVH  Hypertension  Leukocytosis   Hyperlipidemia  Obesity, Body mass index is 29.51 kg/(m^2).   UTI with pyelonephritis  CKD   Anemia  Diabetes mellitus with diabetic polyneuropathy  Acute diastolic heart failure  Wide-complex tachycardia  Tremulousness secondary to amantadine overstimulation  Hypokalemia  Hypernatremia   Malnutrition,  BMI: Body mass index is 29.51 kg/(m^2).  Past Medical History  Diagnosis Date  . Diabetes mellitus with diabetic polyneuropathy   . A-fib   . Renal disorder   . Hypertension   . Anemia   . Glaucoma     right eye   . Carotid artery stenosis   . PVD (peripheral vascular disease)    Past Surgical History  Procedure Laterality Date  . Replacement total knee Right   . Carotid endarterectomy  2015  . Coronary artery bypass graft    . Ablation      cardiac for arrthymia       Medication List    STOP taking these medications        aspirin EC 81 MG tablet     carvedilol 25 MG tablet  Commonly known as:  COREG     lisinopril 5 MG tablet  Commonly known as:  PRINIVIL,ZESTRIL     NIFEDICAL XL 60 MG 24 hr tablet  Generic drug:  NIFEdipine      TAKE these medications         acetaminophen 500 MG tablet  Commonly known as:  TYLENOL  Take 1,000 mg by mouth 3 (three) times daily as needed (pain).     allopurinol 100 MG tablet  Commonly known as:  ZYLOPRIM  Take 100 mg by mouth 2 (two) times daily.     amiodarone 200 MG tablet  Commonly known as:  PACERONE  Place 1 tablet (200 mg total) into feeding tube daily.     atorvastatin 20 MG tablet  Commonly known as:  LIPITOR  Take 1 tablet (20 mg total) by mouth daily at 6 PM.     cholecalciferol 1000 UNITS tablet  Commonly known as:  VITAMIN D  Take 1,000 Units by mouth every other day.     cloNIDine 0.1 MG tablet  Commonly known as:  CATAPRES  Place 1 tablet (0.1 mg total) into feeding tube 2 (two) times daily.     diltiazem 30 MG tablet  Commonly known as:  CARDIZEM  Take 1 tablet (30 mg total) by mouth every 6 (six) hours.     feeding supplement (VITAL AF 1.2 CAL) Liqd  Place 270 mLs into feeding tube 4 (four) times daily - after meals and at bedtime.     ferrous sulfate 325 (65 FE) MG tablet  Take 325 mg by mouth daily with breakfast.     free water Soln  Place 300 mLs  into feeding tube every 4 (four) hours.     furosemide 20 MG tablet  Commonly known as:  LASIX  Take 20 mg by mouth 2 (two) times daily.     metFORMIN 500 MG tablet  Commonly known as:  GLUCOPHAGE  Take 500 mg by mouth daily with breakfast.     metoprolol tartrate 25 mg/10 mL Susp  Commonly known as:  LOPRESSOR  Place 40 mLs (100 mg total) into feeding tube 2 (two) times daily.     pantoprazole sodium 40 mg/20 mL Pack  Commonly known as:  PROTONIX  Place 20 mLs (40 mg total) into feeding tube daily.     potassium chloride 20 MEQ/15ML (10%) Soln  Place 15 mLs (20 mEq total) into feeding tube daily.     QUEtiapine 12.5 mg Tabs tablet  Commonly known as:  SEROQUEL  Take 0.5 tablets (12.5 mg total) by mouth 3 (three) times daily.     Travoprost (BAK Free) 0.004 % Soln ophthalmic solution  Commonly known as:  TRAVATAN   Place 1 drop into the right eye at bedtime.     VITAMIN B-12 PO  Take 1 tablet by mouth every other day.        LABORATORY STUDIES CBC    Component Value Date/Time   WBC 8.9 09/30/2014 0823   RBC 3.13* 09/30/2014 0823   HGB 9.5* 09/30/2014 0823   HCT 32.0* 09/30/2014 0823   PLT 241 09/30/2014 0823   MCV 102.2* 09/30/2014 0823   MCH 30.4 09/30/2014 0823   MCHC 29.7* 09/30/2014 0823   RDW 17.6* 09/30/2014 0823   LYMPHSABS 1.7 09/29/2014 0412   MONOABS 0.6 09/29/2014 0412   EOSABS 0.3 09/29/2014 0412   BASOSABS 0.0 09/29/2014 0412   CMP    Component Value Date/Time   NA 161* 09/30/2014 0823   K 4.4 09/30/2014 0823   CL 121* 09/30/2014 0823   CO2 26 09/30/2014 0823   GLUCOSE 122* 09/30/2014 0823   BUN 60* 09/30/2014 0823   CREATININE 1.37* 09/30/2014 0823   CALCIUM 8.8 09/30/2014 0823   PROT 6.8 09/02/2014 0530   ALBUMIN 2.9* 09/02/2014 0530   AST 19 09/02/2014 0530   ALT 15 09/02/2014 0530   ALKPHOS 71 09/02/2014 0530   BILITOT 0.4 09/02/2014 0530   GFRNONAA 34* 09/30/2014 0823   GFRAA 40* 09/30/2014 0823   COAGS Lab Results  Component Value Date   INR 1.11 09/06/2014   INR 1.11 09/05/2014   INR 1.22 09/04/2014   Lipid Panel    Component Value Date/Time   CHOL 204* 09/02/2014 0530   TRIG 149 09/02/2014 0530   HDL 64 09/02/2014 0530   CHOLHDL 3.2 09/02/2014 0530   VLDL 30 09/02/2014 0530   LDLCALC 110* 09/02/2014 0530   HgbA1C  Lab Results  Component Value Date   HGBA1C 5.6 09/01/2014   Cardiac Panel (last 3 results) No results for input(s): CKTOTAL, CKMB, TROPONINI, RELINDX in the last 72 hours. Urinalysis    Component Value Date/Time   COLORURINE YELLOW 09/29/2014 0951   APPEARANCEUR CLOUDY* 09/29/2014 0951   LABSPEC 1.021 09/29/2014 0951   PHURINE 5.5 09/29/2014 0951   GLUCOSEU NEGATIVE 09/29/2014 0951   HGBUR MODERATE* 09/29/2014 0951   BILIRUBINUR NEGATIVE 09/29/2014 0951   KETONESUR NEGATIVE 09/29/2014 0951   PROTEINUR >300*  09/29/2014 0951   UROBILINOGEN 0.2 09/29/2014 0951   NITRITE POSITIVE* 09/29/2014 0951   LEUKOCYTESUR MODERATE* 09/29/2014 0951   Urine Drug Screen No results found for: LABOPIA, COCAINSCRNUR,  LABBENZ, AMPHETMU, THCU, LABBARB  Alcohol Level No results found for: [facility]   SIGNIFICANT DIAGNOSTIC STUDIES Ct Head Wo Contrast 09/26/2014   1. Near complete resolution of layering blood products in the posterior horn of the lateral ventricles bilaterally. 2. Stable ventricular enlargement. 3. No new hemorrhage. 4. Stable atrophy and moderate white matter disease. 09/14/2014 1. Stable appearance of hydrocephalus involving the lateral and third ventricles with some residual layering blood products. 2. Persisting gas within the frontal horn of the right lateral ventricle following removal of the ventriculostomy catheter. 3. Stable mild diffuse white matter disease without evidence for acute infarct or significant interval change. 4. Stable focal hemorrhage within the left cerebral peduncle. 09/11/2014 Normal ventricular enlargement is stable. Intraventricular hemorrhage slightly improved. Small area of hemorrhage or calcification in the right parietal lobe is stable.  09/08/2014 1. Unchanged dilatation of the third ventricle and likely no significant interval change in dilatation of the lateral ventricles. 2. Unchanged intraventricular blood. 3. Small volume of subarachnoid blood.  10/16/20151. Interval placement of right frontal approach ventriculostomy catheter with tip in the third ventricle. Ventricles remain dilated but are slightly decreased in size relative to previous MRI from 09/03/2014. 2. Layering hemorrhage within the occipital horns of the lateral ventricles bilaterally, stable from prior. 3. Question trace acute subarachnoid hemorrhage within the right sylvian fissure.  08/31/2014 Mild, likely age-appropriate, centralized volume loss without acute intracranial process.   Mr Laqueta Jean  Wo Contrast 09/12/2014   Diffuse ventricular enlargement unchanged from CT scan yesterday. Findings suggest communicating hydrocephalus. Interval removal ventricular catheter with gas now in the right frontal horn. Intraventricular hemorrhage and subarachnoid hemorrhage appears stable. Negative for acute ischemic infarct. 10/14/2015Acute hydrocephalus, worsened since 3 days ago. Layering material in the occipital horns of lateral ventricles, at least partly hemorrhagic. Question focal thrombus in the atrium of the right lateral ventricle, possibly associated with the choroid. This could be the site of hemorrhage. No specific vascular lesion is identified. No intraparenchymal hemorrhage. The differential diagnosis does include meningitis.   Dg Chest Port 1 View 09/30/2014    1. Prior CABG. Stable cardiomegaly. Interim resolution of pulmonary venous congestion. 2. Mild bibasilar atelectasis. Small left pleural effusion cannot be excluded. 09/28/2014    Mild cardiomegaly with central vascular congestion and trace pleural fluid. 09/17/2014    Central vascular congestion without convincing pulmonary edema. Persistent small bilateral pleural effusion with bilateral basilar atelectasis or infiltrate. Status post median sternotomy. Stable NG tube and left IJ central line position. 09/14/2014   Stable cardiomegaly with vascular congestion. Persistent mild bibasilar atelectasis, less likely airspace disease. Small pleural effusions. 09/13/2014   Increasing bibasilar atelectasis or infiltrates. Suspect small effusions. 09/06/2014  Feeding tube with weighted tip in stomach.  09/04/2014 New left jugular central venous catheter tip overlies the distal SVC. No evidence of pneumothorax. Stable cardiomegaly. No active lung disease.  08/31/2014 Borderline cardiomegaly and lung hyperexpansion without acute cardiopulmonary disease.   EEG 09/11/14 : This is a markedly abnormal electroencephalogram  secondary to general background slowing with frequent intermittent discharges of triphasic morphology. Although this may be seen with a severe metabolic encephalopathy can not rule out the possibility that this may represent epileptic phenomenon. Clinical correlation recommended.   2D echo 09/17/14 - Left ventricle: The cavity size was normal. Wall thickness was normal. Systolic function was normal. The estimated ejection fraction was in the range of 50% to 55%. Wall motion was normal; there were no regional wall motion abnormalities.  - Mitral valve: Calcified annulus.  There was moderateregurgitation. - Left atrium: The atrium was moderately dilated. - Pulmonary arteries: PA peak pressure: 35 mm Hg (S).  Carotid Doppler 09/03/2014  - Mild technical difficulty due to movement of the arms and neck. - Right - 1% to 39% ICA stenosis. Vertebral artery flow is antegrade. - Left - 40% to 59% ICA stensosis. Vertebral artery flow isantegrade.  CT abdomen without contrast 09/23/2014 1. Normal gastric morphology and position. 2. Small pleural effusions with passive atelectasis.  Abdominal xray 09/20/2014 Feeding tube tip overlies the proximal body of the stomach.     HISTORY OF PRESENT ILLNESS Susan Calhoun is an 80 y.o. female who was noted by family to be acting confused > 2 weeks prior. Patient was brought to the hospital where she was diagnosed with a UTI. She was given Rocephin and sent home on Keflex. Due to not being awake enough to take her medications she was brought back to the hospital. MRI brain showed "layering material in the occipital horns of lateral ventricles, at least partly hemorrhagic. Question focal thrombus in the atrium of the right lateral ventricle, possibly associated with the choroid. This could be the site of hemorrhage". Infectious disease was consulted and they do not feel this is bacterial meningoencephalitis and ABX were D/C'd. Neuro surgery placed a  Ventriculostomy drain for HCP. Since drain placement her mentation has not fully resolved. Repeat head CT shows no worsening of HCP or blood products. Neurology was asked to further evaluate. At time of evaluation, patient intubated on the vent, Temp 100.8, WBC 12.2. CSF: WBC 168, red blood cells 70000, lymph 7, neutrophile 85, glucose 78, protein 139   HOSPITAL COURSE Susan Calhoun is a 80 y.o. female with history of altered mental status following UTI, seen in the ED 10/11/201, d/c and admitted 09/01/2014 with confusion. Patient was on coumadin at that time for atrial fibrillation. INR 1.84. Administered Levaquin.  BP elevated on the 11th at 166/44, max BP next day 189/112. Pt INR trending up and 2.10 on 09/03/14. In retrospect, initial CT with L IVH. Repeat CT done during workup found to have worsening of IVH with bilateral occipital intraventricular blood with hydrocephalus. Hypertension and coumadin associated coagulapathy etiology of hemorrhage.   Stroke: Non-dominant IVH secondary to coumadin associated coagulopathy in setting of uncontrolled hypertension.  Resultant confusion, altered mental status disproportionate to amount of IVH and CSF pressure.   Intraventricular drain placed with no improvement of mental status.  CSF without source of infection.   EVD placed and removed  Repeat CT 09/26/2014 showed near resolution of the IVH  warfarin prior to admission, no antithrombotics now given hemorrhage. She had IVH with even subtherapeutic INR without significant HTN, so I am not comfortable to resume her coumadin at this time. Will follow up in clinic for further evaluation and decide at that time.  Therapy recommendations: SNF  Disposition: SNF   Altered Mental Status, improving  likely due to IVH and hydrocephalus, now resolved.  LTM EEG did not detect seizure activity   Placed on vimpat and dilantin in hospital which were discontinued  Placed on amantadine 200 mg  bid 09/16/2014 with arousal improved  Developed Tremulousness with incoherence, restlessness and agitation 09/25/2014  Felt to have amantadine over stimulation, added seroquel 12.56 mg bid 09/26/2014, increased to tid 09/29/2014   Mental status improving, conversation though somewhat confused  QTC within normal limits on Seroquel.   If pt becomes more lethargic or drowsiness, may consider decrease seroquel dose.  Atrial Fibrillation  Home meds: Warfarin prior to admission  INR 1.51 on admission 10/12. High as 2.10 on 09/03/14.   No longer an anticoagulation candidate secondary to hemorrhage. Long-term, may consider resumption of anticoagulation.  Had wide complex tachycardia and elevated troponins, cardiology consulted   Afib with aberrancy/rate related bundle branch block.  Continue metoprolol 100mg  bid and cardiazem 30mg  q6 (metoprolol dose increased to 100 mg in hospital d/t tachycardia with agitation)  Continue lasix 40mg  daily  Continue on amiodarone  Repeat 2D echo was unremarkable  High BNP but normal CEs  Hold off coumadin or other anticoagulation now. She had IVH with even subtherapeutic INR without significant HTN, so I am not comfortable to resume her coumadin at this time. Will follow up in clinic for further evaluation and decide at that time.  Dysphagia  Failed swallow assessment  PANDA tube placed for initial tube feeding  PEG was initially declined by daughter (surgery consulted for placement)  PEG placed by IR 09/23/2014 without incidence  In use without difficulty  Changed to bolus tube feedings instead of continuous feedings without difficulty  At discharge, able to tolerate a dysphagia 1 thin liquid diet, though intake is poor  Adjust the tube feeding according to her po intake.  Hypernatremia  Thought to be due to dehydration (No IV access, pt on lasix)  Placed on free water 300 q 4, will likely need to decrease following  discharge.  Na 161, Bun/Cr trending down  Check Na daily and adjust free water accordingly  Hypertension  Stable, SBP 115-140 at discharge.  On metoprolol, clonidine, lasix  BP goal 120-140  Leukocytosis, resolved  WBC stable, now 8.9  cultures sent negative  Completed vancon/zosyn 7 day course   Remains afebrile. CXR unremarkable  UTI, uncomplicated  UA WBC 11-20  cipro 250mg  bid for 3 days.   Hyperlipidemia  Home meds: lipitor 80   LDL 110 goa,l < 70  Hold off lipitor due to IVH while in hospital  Resumed lipitor 20 at discharge   Hypokalemia  Supplemental potassium added 20 daily  Other Stroke Risk Factors  Advanced age  Obesity, Body mass index is 29.51 kg/(m^2).   Coronary artery disease - s/p CABG  ICA stenosis s/p CEA 2015  PVD  Other Active Problems  CKD   Anemia  C Diff specimen negative from 09/17/2014   DISCHARGE EXAM Blood pressure 118/88, pulse 104, temperature 99.3 F (37.4 C), temperature source Axillary, resp. rate 22, height 4\' 11"  (1.499 m), weight 146 lb 2.6 oz (66.3 kg), SpO2 99 %.  General - Well nourished, well developed, mild agitation and decreased attention and concentration, severe dysarthria.  Ophthalmologic - not cooperative on exam  Cardiovascular - irregularly irregular heart rate and rhythm.  Neuro - awake, alert, greetings to provided, very dysarthric. She is able to follow some simple commands, such as close or open eyes, point to the ceiling, etc. Able to answer some questions with short answers appropriately, but has some spontaneous intelligible words. Not able to name or repeat but perseveration. PERRL, EOMI, facial symmetrial, tongue in midline, move all extremities spontanously but muscle tone increased bilaterally with cog wheeling and coarse action tremor. Coordination and gait not able to test.    Discharge Diet   DIET - DYS 1 thin liquids with TF. Adjust TF according to the po  intake.  DISCHARGE PLAN  Disposition:  SNF   Check Na daily and adjust free water accordingly  Adjust TF according to the  po intake  Adjust seroquel dose if pt becomes more lethargic or drowsy.  Follow-up MD at SNF   Follow-up with PCP in 2 weeks after d/c from SNF  Follow-up with Dr. Delia Heady, Stroke Clinic in 1 month.  60 minutes were spent preparing discharge.  Annie Main, MSN, APRN, ANVP-BC, AGPCNP-BC [facility] Pager: [phone] 09/30/2014 12:34 PM   I, the attending vascular neurologist, have personally obtained a history, examined the patient, evaluated laboratory data, individually viewed imaging studies, and formulated the assessment and plan of care.  I have made any additions or clarifications directly to the above note and agree with the findings and plan as currently documented.   Marvel Plan, MD PhD Stroke Neurology 09/30/2014 12:56 PM

## 2014-09-24 NOTE — Clinical Social Work Note (Signed)
Covering CSW followed-up with skilled nursing facilities which had given pt bed offers on 09/23/2014. Covering CSW and facilities discovered pt has out of state insurance, and facilities were no longer able to provide bed offers. Covering CSW consulted with CSW ChiropodistAssistant Director regarding newly developed placement issue. Per CSW ChiropodistAssistant Director, pt is eligible for 30 day LOG (Letter of Guarantee).  Covering CSW spoke with pt's daughter, Susan Calhoun, regarding information above. Pt's daughter understanding and agreeable to broadening SNF search to surrounding counties [place], [place] Pt's daughter informed covering CSW pt's insurance awaiting clinical documentation on pt's current hospital admission. Covering CSW sent appropriate documentation to pt's insurance.  4N CSW to follow-up on 09/25/2014 regarding SNF bed offers.  Susan Calhoun, LCSWA [phone]) Licensed Clinical Social Worker Orthopedics [phone]) and Surgical [phone]([id number])

## 2014-09-24 NOTE — Progress Notes (Signed)
Speech Language Pathology Treatment: Dysphagia;Cognitive-Linquistic  Patient Details Name: Susan SoGiuseppina Sadlon MRN: [id number] DOB: 08/29/1928 Today's Date: 09/24/2014 Time: [id number] SLP Time Calculation (min): 27 min  Assessment / Plan / Recommendation Clinical Impression  Pt demonstrates improving cognition and awareness resulting in improved ability to accept and swallow thin liquids and puree. With moderate visual and tactile cues pt accepted cup and straw sips of water without evidence of aspiration. Pt did initially gag, but did not vomit and continued to accept PO without difficulty. Pt unlikely to nutritionally support herself with PO yet, but do recommend diet to encourage automaticity with meals. RN and family aware.    HPI HPI: [name] is a 80 y.o. female with history of altered mental status following UTI, seen in the ED 08/31/2014 - admitted 09/01/2014 with confusion. Initial CT with L IVH. Repeat CT done during workup found to have worsening of IVH with bilateral occipital intraventricular blood with hydrocephalus. Hypertension and coumadin associated coagulapathy etiology of hemorrhage. Neuro surgery placed a Ventriculostomy drain for HCP. Since drain placement her mentation has not fully resolved. Repeat head CT shows no worsening of HCP or blood products. Questionable history of ventilation from 10/15 until? MD recommended PEG tube, family refused. Pt evaluated by SLP on 09/02/14, recommended to remain NPO due to cognitive deficits, pts arousal declined and SLP signed off.  BSE repeated 1-/30/15 with recommendation for NPO status. SLP in today for po trials. PEG placement pending.   Pertinent Vitals    SLP Plan  Continue with current plan of care    Recommendations Diet recommendations: Dysphagia 1 (puree);Thin liquid Liquids provided via: Cup;Straw Medication Administration: Via alternative means              General recommendations: Rehab consult Oral Care  Recommendations: Oral care Q4 per protocol Follow up Recommendations: Inpatient Rehab Plan: Continue with current plan of care    GO    [facility] HospitalBonnie Maycel Riffe, MA CCC-SLP [phone]  Claudine MoutonDeBlois, Skilynn Durney Caroline 09/24/2014, 11:10 AM

## 2014-09-24 NOTE — Progress Notes (Signed)
Referring Physician(s): Dr Pearlean Brownie  Subjective:  Percutaneous gastric tube placed in IR 11/3 Resting comfortably Little more vocal today   Allergies: Septra  Medications: Prior to Admission medications   Medication Sig Start Date End Date Taking? Authorizing Provider  acetaminophen (TYLENOL) 500 MG tablet Take 1,000 mg by mouth 3 (three) times daily as needed (pain).   Yes Historical Provider, MD  allopurinol (ZYLOPRIM) 100 MG tablet Take 100 mg by mouth 2 (two) times daily.    Yes Historical Provider, MD  aspirin EC 81 MG tablet Take 81 mg by mouth daily with breakfast.    Yes Historical Provider, MD  atorvastatin (LIPITOR) 80 MG tablet Take 80 mg by mouth daily after supper.    Yes Historical Provider, MD  carvedilol (COREG) 25 MG tablet Take 25 mg by mouth 2 (two) times daily with a meal.   Yes Historical Provider, MD  cephALEXin (KEFLEX) 500 MG capsule Take 1 capsule (500 mg total) by mouth 4 (four) times daily. 08/31/14  Yes Pilar Jarvis, MD  cholecalciferol (VITAMIN D) 1000 UNITS tablet Take 1,000 Units by mouth every other day.   Yes Historical Provider, MD  Cyanocobalamin (VITAMIN B-12 PO) Take 1 tablet by mouth every other day.   Yes Historical Provider, MD  ferrous sulfate 325 (65 FE) MG tablet Take 325 mg by mouth daily with breakfast.   Yes Historical Provider, MD  furosemide (LASIX) 20 MG tablet Take 20 mg by mouth 2 (two) times daily.    Yes Historical Provider, MD  lisinopril (PRINIVIL,ZESTRIL) 5 MG tablet Take 5 mg by mouth daily with breakfast.    Yes Historical Provider, MD  metFORMIN (GLUCOPHAGE) 500 MG tablet Take 500 mg by mouth daily with breakfast.    Yes Historical Provider, MD  NIFEdipine (NIFEDICAL XL) 60 MG 24 hr tablet Take 60 mg by mouth daily after supper.    Yes Historical Provider, MD  Travoprost, BAK Free, (TRAVATAN) 0.004 % SOLN ophthalmic solution Place 1 drop into the right eye at bedtime.   Yes Historical Provider, MD  warfarin (COUMADIN) 5 MG  tablet Take 5-10 mg by mouth daily after supper. Take 2 tablets (10 mg) on Monday, then take 1 tablet (5 mg) on Tuesday thru Sunday   Yes Historical Provider, MD    Review of Systems  Vital Signs: BP 135/99 mmHg  Pulse 101  Temp(Src) 97.9 F (36.6 C) (Oral)  Resp 20  Ht 4\' 11"  (1.499 m)  Wt 66.3 kg (146 lb 2.6 oz)  BMI 29.51 kg/m2  SpO2 94%  Physical Exam  Abdominal:  Afe; vss G tube site clean and dry; NT No bleeding +BS     Imaging: Ct Abdomen Wo Contrast  09/23/2014   CLINICAL DATA:  Malnutrition.  Anatomy for PEG tube placement.  EXAM: CT ABDOMEN WITHOUT CONTRAST  TECHNIQUE: Multidetector CT imaging of the abdomen was performed following the standard protocol without IV contrast.  COMPARISON:  None.  FINDINGS: LOWER CHEST: Extensive coronary atherosclerosis. Partially visualized aortic valve sclerosis/calcification.  Small and layering bilateral pleural effusions with passive atelectasis.  ABDOMEN/PELVIS:  Liver: No focal abnormality.  Biliary: Sludge or other layering high density within the gallbladder. No distention.  Pancreas: Unremarkable.  Spleen: Unremarkable.  Adrenals: Unremarkable.  Kidneys and ureters: No hydronephrosis.  Bowel: Feeding tube tip terminates in the proximal stomach. The non insufflated stomach minimally overlaps with the left lobe of the liver. The transverse colon is well inferior to the stomach. There is no evidence of gastric  mass or displacement. No abdominal wall hernia or other impediment. No bowel obstruction.  Retroperitoneum: No mass or adenopathy.  Peritoneum: No ascites or pneumoperitoneum.  Vascular: Extensive atherosclerotic calcification.  OSSEOUS: No acute abnormalities.  IMPRESSION: 1. Normal gastric morphology and position. Surrounding anatomy described above. 2. Small pleural effusions with passive atelectasis.   Electronically Signed   By: Tiburcio PeaJonathan  Watts M.D.   On: 09/23/2014 10:05   Ir Gastrostomy Tube Mod Sed  09/23/2014   INDICATION:  Patient admitted with intraventricular/subarachnoid hemorrhage with persistent altered mental status and now with dysphagia. Request made for percutaneous gastrostomy tube placement for enteric nutrition.  EXAM: PULL TROUGH GASTROSTOMY TUBE PLACEMENT  COMPARISON:  CT abdomen and pelvis - 09/23/2014  MEDICATIONS: The patient is currently admitted to the hospital receiving intravenous antibiotics. The antibiotics were administered within an appropriate time frame prior to initiation of the procedure.  CONTRAST:  15 mL of Isovue 300 administered into the gastric lumen.  ANESTHESIA/SEDATION: Versed 1 mg IV; Fentanyl 50 mcg IV  Sedation time  25 minutes  FLUOROSCOPY TIME:  4 minutes.  COMPLICATIONS: None immediate  PROCEDURE: Informed written consent was obtained from the patient's daughter following explanation of the procedure, risks, benefits and alternatives. A time out was performed prior to the initiation of the procedure. Ultrasound scanning was performed to demarcate the edge of the left lobe of the liver. Maximal barrier sterile technique utilized including caps, mask, sterile gowns, sterile gloves, large sterile drape, hand hygiene and Betadine prep.  The left upper quadrant was sterilely prepped and draped. An oral gastric catheter was inserted into the stomach under fluoroscopy. The existing nasogastric feeding tube was removed. The left costal margin and air opacified transverse colon were identified and avoided. Air was injected into the stomach for insufflation and visualization under fluoroscopy. Under sterile conditions a 17 gauge trocar needle was utilized to access the stomach percutaneously beneath the left subcostal margin after the overlying soft tissues were anesthetized with 1% Lidocaine with epinephrine. Needle position was confirmed within the stomach with aspiration of air and injection of small amount of contrast. A single T tack was deployed for gastropexy. Over an Amplatz guide wire, a  9-French sheath was inserted into the stomach. A snare device was utilized to capture the oral gastric catheter. The snare device was pulled retrograde from the stomach up the esophagus and out the oropharynx. The 20-French pull-through gastrostomy was connected to the snare device and pulled antegrade through the oropharynx down the esophagus into the stomach and then through the percutaneous tract external to the patient. The gastrostomy was assembled externally. Contrast injection confirms position in the stomach. Several spot radiographic images were obtained in various obliquities for documentation. The patient tolerated procedure well without immediate post procedural complication.  FINDINGS: After successful fluoroscopic guided placement, the gastrostomy tube is appropriately positioned with internal disc against the ventral aspect of the gastric lumen.  IMPRESSION: Successful fluoroscopic insertion of a 20-French pull-through gastrostomy tube.  The gastrostomy may be used immediately for medication administration and in 24 hrs for the initiation of feeds.   Electronically Signed   By: Simonne ComeJohn  Watts M.D.   On: 09/23/2014 14:55   Dg Abd Portable 1v  09/20/2014   CLINICAL DATA:  Feeding tube placement.  EXAM: PORTABLE ABDOMEN - 1 VIEW  COMPARISON:  09/09/14  FINDINGS: A feeding tube is now seen with the tip overlying the proximal body of the stomach. No evidence of dilated bowel loops.  IMPRESSION: Feeding tube tip  overlies the proximal body of the stomach.   Electronically Signed   By: Myles RosenthalJohn  Stahl M.D.   On: 09/20/2014 19:20    Labs:  CBC:  Recent Labs  09/18/14 0536 09/19/14 0758 09/21/14 0746 09/22/14 0611  WBC 11.1* 12.6* 12.7* 12.3*  HGB 8.9* 8.8* 9.8* 10.3*  HCT 28.0* 27.7* 30.6* 32.8*  PLT 266 266 320 269    COAGS:  Recent Labs  09/03/14 2245 09/04/14 0243 09/05/14 0545 09/06/14 0500  INR 1.46 1.22 1.11 1.11  APTT 30  --   --   --     BMP:  Recent Labs  09/20/14 0805  09/21/14 0746 09/21/14 1830 09/22/14 0611  NA 142 145 146 144  K 3.9 5.1 5.0 5.3  CL 104 106 108 106  CO2 26 27 25 25   GLUCOSE 141* 160* 138* 145*  BUN 28* 28* 30* 34*  CALCIUM 8.3* 8.5 8.4 8.6  CREATININE 1.00 0.93 1.03 1.07  GFRNONAA 50* 55* 49* 46*  GFRAA 58* 64* 56* 54*    LIVER FUNCTION TESTS:  Recent Labs  08/31/14 1807 09/01/14 1648 09/02/14 0530  BILITOT 0.3 0.5 0.4  AST 18 22 19   ALT 16 17 15   ALKPHOS 73 81 71  PROT 7.5 8.1 6.8  ALBUMIN 3.5 3.5 2.9*    Assessment and Plan:  Gtube placed 11/3 May use now    I spent a total of 15 minutes face to face in clinical consultation/evaluation, greater than 50% of which was counseling/coordinating care for Perc G tube placement  Signed: Charese Abundis A 09/24/2014, 8:25 AM

## 2014-09-24 NOTE — Progress Notes (Signed)
Patient removed flexiseal, installed new one patient resting comfortable will continue to monitor.

## 2014-09-24 NOTE — Progress Notes (Addendum)
STROKE TEAM PROGRESS NOTE   HISTORY Susan Calhoun is an 80 y.o. female who was noted by family to be acting confused > 2 weeks prior. Patient was brought to the hospital where she was diagnosed with a UTI. She was given Rocephin and sent home on Keflex. Due to not being awake enough to take her medications she was brought back to the hospital. MRI brain showed "layering material in the occipital horns of lateral ventricles, at least partly hemorrhagic. Question focal thrombus in the atrium of the right lateral ventricle, possibly associated with the choroid. This could be the site of hemorrhage". Infectious disease was consulted and they do not feel this is bacterial meningoencephalitis and ABX were D/C'd. Neuro surgery placed a Ventriculostomy drain for HCP. Since drain placement her mentation has not fully resolved. Repeat head CT shows no worsening of HCP or blood products. Neurology was asked to further evaluate. At time of evaluation, patient intubated on the vent, Temp 100.8, WBC 12.2. CSF: WBC 168, red blood cells 70000, lymph 7, neutrophile 85, glucose 78, protein 139   SUBJECTIVE (INTERVAL HISTORY) Daughter at bedside. She has been to visit one nursing home this morning that she was not accepting of. She is to visit another this afternoon. She understands patient plans for discharge today.    OBJECTIVE Temp:  [97.9 F (36.6 C)-100.2 F (37.9 C)] 98.5 F (36.9 C) (11/04 1339) Pulse Rate:  [70-208] 93 (11/04 1339) Cardiac Rhythm:  [-]  Resp:  [18-20] 20 (11/04 1339) BP: (130-155)/(56-105) 136/56 mmHg (11/04 1339) SpO2:  [94 %-100 %] 100 % (11/04 1339)   Recent Labs Lab 09/23/14 1656 09/24/14 0004 09/24/14 0543 09/24/14 1115 09/24/14 1221  GLUCAP 175* 115* 84 131* 117*    Recent Labs Lab 09/19/14 0758 09/20/14 0805 09/21/14 0746 09/21/14 1830 09/22/14 0611  NA 143 142 145 146 144  K 4.2 3.9 5.1 5.0 5.3  CL 106 104 106 108 106  CO2 25 26 27 25 25   GLUCOSE 89 141*  160* 138* 145*  BUN 28* 28* 28* 30* 34*  CREATININE 1.04 1.00 0.93 1.03 1.07  CALCIUM 8.2* 8.3* 8.5 8.4 8.6   No results for input(s): AST, ALT, ALKPHOS, BILITOT, PROT, ALBUMIN in the last 168 hours.  Recent Labs Lab 09/18/14 0536 09/19/14 0758 09/21/14 0746 09/22/14 0611  WBC 11.1* 12.6* 12.7* 12.3*  HGB 8.9* 8.8* 9.8* 10.3*  HCT 28.0* 27.7* 30.6* 32.8*  MCV 94.9 93.6 95.3 97.0  PLT 266 266 320 269   No results for input(s): CKTOTAL, CKMB, CKMBINDEX, TROPONINI in the last 168 hours. No results for input(s): LABPROT, INR in the last 72 hours. No results for input(s): COLORURINE, LABSPEC, PHURINE, GLUCOSEU, HGBUR, BILIRUBINUR, KETONESUR, PROTEINUR, UROBILINOGEN, NITRITE, LEUKOCYTESUR in the last 72 hours.  Invalid input(s): APPERANCEUR     Component Value Date/Time   CHOL 204* 09/02/2014 0530   TRIG 149 09/02/2014 0530   HDL 64 09/02/2014 0530   CHOLHDL 3.2 09/02/2014 0530   VLDL 30 09/02/2014 0530   LDLCALC 110* 09/02/2014 0530   Lab Results  Component Value Date   HGBA1C 5.6 09/01/2014    Ct Head Wo Contrast 09/14/2014  1. Stable appearance of hydrocephalus involving the lateral and third ventricles with some residual layering blood products. 2. Persisting gas within the frontal horn of the right lateral ventricle following removal of the ventriculostomy catheter. 3. Stable mild diffuse white matter disease without evidence for acute infarct or significant interval change. 4. Stable focal hemorrhage within the  left cerebral peduncle. 09/11/2014   Normal ventricular enlargement is stable. Intraventricular hemorrhage slightly improved. Small area of hemorrhage or calcification in the right parietal lobe is stable.  09/08/2014  1. Unchanged dilatation of the third ventricle and likely no significant interval change in dilatation of the lateral ventricles. 2. Unchanged intraventricular blood. 3. Small volume of subarachnoid blood.    09/05/2014   1. Interval placement of right  frontal approach ventriculostomy catheter with tip in the third ventricle. Ventricles remain dilated but are slightly decreased in size relative to previous MRI from 09/03/2014. 2. Layering hemorrhage within the occipital horns of the lateral ventricles bilaterally, stable from prior. 3. Question trace acute subarachnoid hemorrhage within the right sylvian fissure.     08/31/2014  Mild, likely age-appropriate, centralized volume loss without acute intracranial process.     [name] Contrast 09/12/14 Diffuse ventricular enlargement unchanged from CT scan yesterday. Findings suggest communicating hydrocephalus. Interval removal ventricular catheter with gas now in the right frontal horn. Intraventricular hemorrhage and subarachnoid hemorrhage appears stable. Negative for acute ischemic infarct. 09/03/2014    Acute hydrocephalus, worsened since 3 days ago. Layering material in the occipital horns of lateral ventricles, at least partly hemorrhagic. Question focal thrombus in the atrium of the right lateral ventricle, possibly associated with the choroid. This could be the site of hemorrhage. No specific vascular lesion is identified. No intraparenchymal hemorrhage. The differential diagnosis does include meningitis.    Dg Chest Port 1 View 09/17/2014 Central vascular congestion without convincing pulmonary edema. Persistent small bilateral pleural effusion with bilateral basilar atelectasis or infiltrate. Status post median sternotomy. Stable NG tube and left IJ central line position. 09/14/2014  Stable cardiomegaly with vascular congestion. Persistent mild bibasilar atelectasis, less likely airspace disease. Small pleural effusions. 09/13/2014  Increasing bibasilar atelectasis or infiltrates. Suspect small effusions. 09/06/2014   Feeding tube with weighted tip in stomach.   09/04/2014    New left jugular central venous catheter tip overlies the distal SVC. No evidence of pneumothorax.  Stable  cardiomegaly.  No active lung disease.    08/31/2014    Borderline cardiomegaly and lung hyperexpansion without acute cardiopulmonary disease.    EEG 09/11/14 : This is a markedly abnormal electroencephalogram secondary to general background slowing with frequent intermittent discharges of triphasic morphology. Although this may be seen with a severe metabolic encephalopathy can not rule out the possibility that this may represent epileptic phenomenon. Clinical correlation recommended.   2D echo 09/17/14 - Left ventricle: The cavity size was normal. Wall thickness was normal. Systolic function was normal. The estimated ejection fraction was in the range of 50% to 55%. Wall motion was normal; there were no regional wall motion abnormalities.  - Mitral valve: Calcified annulus. There was moderateregurgitation. - Left atrium: The atrium was moderately dilated. - Pulmonary arteries: PA peak pressure: 35 mm Hg (S).  Carotid Doppler  09/03/2014  - Mild technical difficulty due to movement of the arms and neck. - Right - 1% to 39% ICA stenosis. Vertebral artery flow is antegrade. - Left - 40% to 59% ICA stensosis. Vertebral artery flow isantegrade.   PHYSICAL EXAM Frail elderly Caucasian lady not in distress. Afebrile. Head is nontraumatic. Neck is supple without bruit.  Cardiac exam no murmur or gallop, but tachycardia. Lungs are clear to auscultation. Distal pulses are well felt. Neurological Exam: The patient is laying in bed with eyes open. She is   responsive today. She did state that she is doing okay;  follow  command x 1 RUE. She focuses and tracks. Pupils exam showed left surgical pupil, left sluggish to light. Slight dysconjugate gaze with left eye hypotropia. Roving eye movements present. Does not blink to threat on either side. Fundi could not be visualized. Face is symmetric. Tongue is midline. BLE withdraw to pain but not against gravity, LUE flaccid 2-3/5 spontaneously. RUE at least 3/5  spontaneously. Left plantar upgoing right equivocal. Sensation and coordination cannot be reliably tested. Gait was not tested.   ASSESSMENT/PLAN [name] is a 80 y.o. female with history of altered mental status following UTI, seen in the ED 08/31/2014 - admitted 09/01/2014 with confusion. Patient was on coumadin at that time for atrial fibrillation. INR 1.84. Administered Levaquin. BP elevated on the 11th at 166/44, max BP next day 189/112. Pt INR trending up and 2.10 on 09/03/14. In retrospect, initial CT with L IVH. Repeat CT done during workup found to have worsening of IVH with bilateral occipital intraventricular blood with hydrocephalus. Hypertension and coumadin associated coagulapathy etiology of hemorrhage.   Stroke:  Non-dominant IVH secondary to coumadin associated coagulopathy in setting of uncontrolled hypertension.  Intraventricular drain placed with no improvement of mental status.  CSF without source of infection.   EVD removed  SCDs for VTE prophylaxis  Up with assistance   warfarin prior to admission, no antithrombotics now given hemorrhage  Resultant confusion, unable to follow commands mental status disproportionate to amount of IVH and CSF pressure.  Therapy recommendations: SNF  Disposition:  SNF - await bed availability. Social worker has multiple bed offers, however there are some issues with insurance. We will await bed confirmation.  AMS  - likely due to IVH and hydrocephalus  - LTM EEG did not detect seizure activity   - d/c vimpat and dilantin  - arousal improved on amantadine  - continue amantadine 200mg  bid   Atrial Fibrillation  Home meds:  Warfarin prior to admission  INR 1.51 on admission 10/12. High as 2.10 on 09/03/14.   No longer an anticoagulation candidate secondary to hemorrhage  Had wide complex tachycardia, cardiology consulted - see note 09/16/2014  Consider Afib with aberrancy/rate related bundle branch  block.  Continue metoprolol 75mg  bid and cardiazem 30mg  q6   Continue lasix 40mg  daily  Continue on amiodarone  Repeat 2D echo unremarkable  High BNP but normal CEs  Cardiology recs appreciated.  D/c tele.  Dysphagia  - NPO now  - PANDA tube for tube feeding  - PEG declined by daughter on Friday but now agreeable to placement  PEG placed 11 3. Cleared for use on 11 4.  tube feedings resumed.  We will ask dietitian to consult for bolus feeds.  Hypertension  Stable, on the higher side  On metoprolol, increase from 25mg  bid to 50mg  bid, and now 75mg  bid  Leukocytosis   - WBC down trending 13.2->11.9 ->11.0 -> 10.2 ->11.1 ->12.6->12.3  - cultures sent so far negative  - vancon/zosyn 7 day course completed  - afebrile  - d/c daily labs  Hyperlipidemia  Home meds:  lipitor 80   LDL 110 goal < 70  D/c lipitor due to IVH  Other Stroke Risk Factors Advanced age . Obesity, Body mass index is 29.51 kg/(m^2).  Marland Kitchen Coronary artery disease - s/p CABG ICA stenosis s/p CEA 2015 PVD  Other Active Problems  UTI with pyelonephritis - antibiotic course completed.   CKD   Anemia  C Diff specimen negative from 09/17/2014   Hospital day # 23  Annie Main, MSN, APRN, ANVP-BC, AGPCNP-BC [facility] Pager: [phone] 09/24/2014 3:10 PM   I have personally examined this patient, reviewed notes, independently viewed imaging studies, participated in medical decision making and plan of care. I have made any additions or clarifications directly to the above note. Agree with note above. Await peg tube today .Hopefully SNF dc in a few days later Delia Heady, MD Medical Director [facility] Pager: [phone] 09/24/2014 3:10 PM  (addendum to note, only cosignature for Dr. Pearlean Brownie - he did addendum yesterday, however did not file under his name)   To contact Stroke Continuity provider, please refer to [email or web address]. After hours, contact  General Neurology

## 2014-09-25 LAB — GLUCOSE, CAPILLARY
Glucose-Capillary: 147 mg/dL — ABNORMAL HIGH (ref 70–99)
Glucose-Capillary: 159 mg/dL — ABNORMAL HIGH (ref 70–99)
Glucose-Capillary: 167 mg/dL — ABNORMAL HIGH (ref 70–99)
Glucose-Capillary: 157 mg/dL — ABNORMAL HIGH (ref 70–99)

## 2014-09-25 LAB — BASIC METABOLIC PANEL
Anion gap: 15 (ref 5–15)
BUN: 33 mg/dL — AB (ref 6–23)
CALCIUM: 8.8 mg/dL (ref 8.4–10.5)
CO2: 26 meq/L (ref 19–32)
Chloride: 105 mEq/L (ref 96–112)
Creatinine, Ser: 1.27 mg/dL — ABNORMAL HIGH (ref 0.50–1.10)
GFR calc Af Amer: 44 mL/min — ABNORMAL LOW (ref >90)
GFR calc non Af Amer: 38 mL/min — ABNORMAL LOW (ref >90)
GLUCOSE: 149 mg/dL — AB (ref 70–99)
Potassium: 3.6 mEq/L — ABNORMAL LOW (ref 3.7–5.3)
Sodium: 146 mEq/L (ref 137–147)

## 2014-09-25 LAB — CBC
HCT: 32.1 % — ABNORMAL LOW (ref 36.0–46.0)
HEMOGLOBIN: 9.9 g/dL — AB (ref 12.0–15.0)
MCH: 30.1 pg (ref 26.0–34.0)
MCHC: 30.8 g/dL (ref 30.0–36.0)
MCV: 97.6 fL (ref 78.0–100.0)
Platelets: 284 10*3/uL (ref 150–400)
RBC: 3.29 MIL/uL — AB (ref 3.87–5.11)
RDW: 17.5 % — ABNORMAL HIGH (ref 11.5–15.5)
WBC: 12.4 10*3/uL — ABNORMAL HIGH (ref 4.0–10.5)

## 2014-09-25 MED ORDER — HALOPERIDOL LACTATE 5 MG/ML IJ SOLN
2.0000 mg | Freq: Once | INTRAMUSCULAR | Status: AC
  Administered 2014-09-25: 2 mg via INTRAVENOUS
  Filled 2014-09-25: qty 1

## 2014-09-25 MED ORDER — VITAL AF 1.2 CAL PO LIQD
270.0000 mL | Freq: Three times a day (TID) | ORAL | Status: DC
  Administered 2014-09-25: 270 mL
  Administered 2014-09-26: 237 mL
  Administered 2014-09-26 – 2014-09-30 (×14): 270 mL
  Filled 2014-09-25 (×3): qty 474

## 2014-09-25 NOTE — Progress Notes (Signed)
STROKE TEAM PROGRESS NOTE   HISTORY Susan Calhoun is an 80 y.o. female who was noted by family to be acting confused > 2 weeks prior. Patient was brought to the hospital where she was diagnosed with a UTI. She was given Rocephin and sent home on Keflex. Due to not being awake enough to take her medications she was brought back to the hospital. MRI brain showed "layering material in the occipital horns of lateral ventricles, at least partly hemorrhagic. Question focal thrombus in the atrium of the right lateral ventricle, possibly associated with the choroid. This could be the site of hemorrhage". Infectious disease was consulted and they do not feel this is bacterial meningoencephalitis and ABX were D/C'd. Neuro surgery placed a Ventriculostomy drain for HCP. Since drain placement her mentation has not fully resolved. Repeat head CT shows no worsening of HCP or blood products. Neurology was asked to further evaluate. At time of evaluation, patient intubated on the vent, Temp 100.8, WBC 12.2. CSF: WBC 168, red blood cells 70000, lymph 7, neutrophile 85, glucose 78, protein 139   SUBJECTIVE (INTERVAL HISTORY) Daughter at bedside. She is concerned that the patient is tremulous and restless and agitated today.   OBJECTIVE Temp:  [98.2 F (36.8 C)-99.1 F (37.3 C)] 98.5 F (36.9 C) (11/05 1406) Pulse Rate:  [92-112] 106 (11/05 1406) Cardiac Rhythm:  [-]  Resp:  [18-20] 18 (11/05 1009) BP: (104-154)/(46-79) 107/46 mmHg (11/05 1406) SpO2:  [93 %-99 %] 93 % (11/05 1406)   Recent Labs Lab 09/24/14 1221 09/24/14 1826 09/24/14 2226 09/25/14 0558 09/25/14 1155  GLUCAP 117* 93 111* 147* 159*    Recent Labs Lab 09/19/14 0758 09/20/14 0805 09/21/14 0746 09/21/14 1830 09/22/14 0611  NA 143 142 145 146 144  K 4.2 3.9 5.1 5.0 5.3  CL 106 104 106 108 106  CO2 25 26 27 25 25   GLUCOSE 89 141* 160* 138* 145*  BUN 28* 28* 28* 30* 34*  CREATININE 1.04 1.00 0.93 1.03 1.07  CALCIUM 8.2* 8.3*  8.5 8.4 8.6   No results for input(s): AST, ALT, ALKPHOS, BILITOT, PROT, ALBUMIN in the last 168 hours.  Recent Labs Lab 09/19/14 0758 09/21/14 0746 09/22/14 0611  WBC 12.6* 12.7* 12.3*  HGB 8.8* 9.8* 10.3*  HCT 27.7* 30.6* 32.8*  MCV 93.6 95.3 97.0  PLT 266 320 269   No results for input(s): CKTOTAL, CKMB, CKMBINDEX, TROPONINI in the last 168 hours. No results for input(s): LABPROT, INR in the last 72 hours. No results for input(s): COLORURINE, LABSPEC, PHURINE, GLUCOSEU, HGBUR, BILIRUBINUR, KETONESUR, PROTEINUR, UROBILINOGEN, NITRITE, LEUKOCYTESUR in the last 72 hours.  Invalid input(s): APPERANCEUR     Component Value Date/Time   CHOL 204* 09/02/2014 0530   TRIG 149 09/02/2014 0530   HDL 64 09/02/2014 0530   CHOLHDL 3.2 09/02/2014 0530   VLDL 30 09/02/2014 0530   LDLCALC 110* 09/02/2014 0530   Lab Results  Component Value Date   HGBA1C 5.6 09/01/2014    Ct Head Wo Contrast 09/14/2014  1. Stable appearance of hydrocephalus involving the lateral and third ventricles with some residual layering blood products. 2. Persisting gas within the frontal horn of the right lateral ventricle following removal of the ventriculostomy catheter. 3. Stable mild diffuse white matter disease without evidence for acute infarct or significant interval change. 4. Stable focal hemorrhage within the left cerebral peduncle. 09/11/2014   Normal ventricular enlargement is stable. Intraventricular hemorrhage slightly improved. Small area of hemorrhage or calcification in the right parietal  lobe is stable.  09/08/2014  1. Unchanged dilatation of the third ventricle and likely no significant interval change in dilatation of the lateral ventricles. 2. Unchanged intraventricular blood. 3. Small volume of subarachnoid blood.    09/05/2014   1. Interval placement of right frontal approach ventriculostomy catheter with tip in the third ventricle. Ventricles remain dilated but are slightly decreased in size  relative to previous MRI from 09/03/2014. 2. Layering hemorrhage within the occipital horns of the lateral ventricles bilaterally, stable from prior. 3. Question trace acute subarachnoid hemorrhage within the right sylvian fissure.     08/31/2014  Mild, likely age-appropriate, centralized volume loss without acute intracranial process.     [name] Contrast 09/12/14 Diffuse ventricular enlargement unchanged from CT scan yesterday. Findings suggest communicating hydrocephalus. Interval removal ventricular catheter with gas now in the right frontal horn. Intraventricular hemorrhage and subarachnoid hemorrhage appears stable. Negative for acute ischemic infarct. 09/03/2014    Acute hydrocephalus, worsened since 3 days ago. Layering material in the occipital horns of lateral ventricles, at least partly hemorrhagic. Question focal thrombus in the atrium of the right lateral ventricle, possibly associated with the choroid. This could be the site of hemorrhage. No specific vascular lesion is identified. No intraparenchymal hemorrhage. The differential diagnosis does include meningitis.    Dg Chest Port 1 View 09/17/2014 Central vascular congestion without convincing pulmonary edema. Persistent small bilateral pleural effusion with bilateral basilar atelectasis or infiltrate. Status post median sternotomy. Stable NG tube and left IJ central line position. 09/14/2014  Stable cardiomegaly with vascular congestion. Persistent mild bibasilar atelectasis, less likely airspace disease. Small pleural effusions. 09/13/2014  Increasing bibasilar atelectasis or infiltrates. Suspect small effusions. 09/06/2014   Feeding tube with weighted tip in stomach.   09/04/2014    New left jugular central venous catheter tip overlies the distal SVC. No evidence of pneumothorax.  Stable cardiomegaly.  No active lung disease.    08/31/2014    Borderline cardiomegaly and lung hyperexpansion without acute cardiopulmonary disease.     EEG 09/11/14 : This is a markedly abnormal electroencephalogram secondary to general background slowing with frequent intermittent discharges of triphasic morphology. Although this may be seen with a severe metabolic encephalopathy can not rule out the possibility that this may represent epileptic phenomenon. Clinical correlation recommended.   2D echo 09/17/14 - Left ventricle: The cavity size was normal. Wall thickness was normal. Systolic function was normal. The estimated ejection fraction was in the range of 50% to 55%. Wall motion was normal; there were no regional wall motion abnormalities.  - Mitral valve: Calcified annulus. There was moderateregurgitation. - Left atrium: The atrium was moderately dilated. - Pulmonary arteries: PA peak pressure: 35 mm Hg (S).  Carotid Doppler  09/03/2014  - Mild technical difficulty due to movement of the arms and neck. - Right - 1% to 39% ICA stenosis. Vertebral artery flow is antegrade. - Left - 40% to 59% ICA stensosis. Vertebral artery flow isantegrade.   PHYSICAL EXAM Frail elderly Caucasian lady not in distress. Afebrile. Head is nontraumatic. Neck is supple without bruit.  Cardiac exam no murmur or gallop, but tachycardia. Lungs are clear to auscultation. Distal pulses are well felt. Neurological Exam: The patient is laying in bed with eyes open. She is   Restless and intermittently agitated and tremulous  but stops shaking when redirected and focusesShe did state that she is doing okay;  follow commands x 1 RUE. She focuses and tracks. Pupils exam showed left surgical pupil,  left sluggish to light. Slight dysconjugate gaze with left eye hypotropia. Roving eye movements present. Does not blink to threat on either side. Fundi could not be visualized. Face is symmetric. Tongue is midline. BLE withdraw to pain but not against gravity, LUE flaccid 2-3/5 spontaneously. RUE at least 3/5 spontaneously. Left plantar upgoing right equivocal. Sensation and  coordination cannot be reliably tested. Gait was not tested.   ASSESSMENT/PLAN Susan Calhoun is a 80 y.o. female with history of altered mental status following UTI, seen in the ED 08/31/2014 - admitted 09/01/2014 with confusion. Patient was on coumadin at that time for atrial fibrillation. INR 1.84. Administered Levaquin. BP elevated on the 11th at 166/44, max BP next day 189/112. Pt INR trending up and 2.10 on 09/03/14. In retrospect, initial CT with L IVH. Repeat CT done during workup found to have worsening of IVH with bilateral occipital intraventricular blood with hydrocephalus. Hypertension and coumadin associated coagulapathy etiology of hemorrhage.   Stroke:  Non-dominant IVH secondary to coumadin associated coagulopathy in setting of uncontrolled hypertension.  Intraventricular drain placed with no improvement of mental status.  CSF without source of infection.   EVD removed  SCDs for VTE prophylaxis  Up with assistance   warfarin prior to admission, no antithrombotics now given hemorrhage  Resultant confusion, unable to follow commands mental status disproportionate to amount of IVH and CSF pressure.  Therapy recommendations: SNF  Disposition:  SNF - await bed availability. Social worker has multiple bed offers, however there are some issues with insurance. We will await bed confirmation.  AMS  - likely due to IVH and hydrocephalus  - LTM EEG did not detect seizure activity   - d/c vimpat and dilantin  - arousal improved on amantadine  - On  amantadine 200mg  bid- plan to taper and discontinue as she is quite awake now and may be contoured into tremors  Atrial Fibrillation  Home meds:  Warfarin prior to admission  INR 1.51 on admission 10/12. High as 2.10 on 09/03/14.   No longer an anticoagulation candidate secondary to hemorrhage  Had wide complex tachycardia, cardiology consulted - see note 09/16/2014  Consider Afib with aberrancy/rate related  bundle branch block.  Continue metoprolol 75mg  bid and cardiazem 30mg  q6   Continue lasix 40mg  daily  Continue on amiodarone  Repeat 2D echo unremarkable  High BNP but normal CEs  Cardiology recs appreciated.  D/c tele.  Dysphagia  - NPO now  - PANDA tube for tube feeding  - PEG declined by daughter on Friday but now agreeable to placement  PEG placed 11 3. Cleared for use on 11 4.  tube feedings resumed.  We will ask dietitian to consult for bolus feeds.  Hypertension  Stable, on the higher side  On metoprolol, increase from 25mg  bid to 50mg  bid, and now 75mg  bid  Leukocytosis   - WBC down trending 13.2->11.9 ->11.0 -> 10.2 ->11.1 ->12.6->12.3  - cultures sent so far negative  - vancon/zosyn 7 day course completed  - afebrile  - d/c daily labs  Hyperlipidemia  Home meds:  lipitor 80   LDL 110 goal < 70  D/c lipitor due to IVH  Other Stroke Risk Factors Advanced age . Obesity, Body mass index is 29.51 kg/(m^2).  Marland Kitchen. Coronary artery disease - s/p CABG ICA stenosis s/p CEA 2015 PVD  Other Active Problems  UTI with pyelonephritis - antibiotic course completed.   CKD   Anemia  C Diff specimen negative from 09/17/2014   [facility]  day [facility] 24  Annie Main, MSN, APRN, ANVP-BC, AGPCNP-BC [facility] Pager: [phone] 09/25/2014 3:08 PM   I have personally examined this patient, reviewed notes, independently viewed imaging studies, participated in medical decision making and plan of care. I have made any additions or clarifications directly to the above note. Agree with note above.  Plan taper and discontinue amantadine as it may be contoured into agitation and tremors. Check basic metabolic panel labs, CBC. Use Haldol when necessary for agitation. Discussed with daughter and answered questions Delia Heady, MD Medical Director [facility] Pager: [phone] 09/25/2014 3:08 PM  (addendum to note, only cosignature  for Dr. Pearlean Brownie - he did addendum yesterday, however did not file under his name)   To contact Stroke Continuity provider, please refer to [email or web address]. After hours, contact General Neurology

## 2014-09-25 NOTE — Progress Notes (Signed)
Speech Language Pathology Treatment: Dysphagia  Patient Details Name: Susan Calhoun MRN: [id number] DOB: 11/14/1928 Today's Date: 09/25/2014 Time: [id number] SLP Time Calculation (min): 18 min  Assessment / Plan / Recommendation Clinical Impression  SLP provided sips of juice and bites of pudding with mac tactile contextual and verbal cues to increase pts awareness of PO. Pt with occasional cough with thin liquids when taking large straw sips, Improved with small cup sips. Recommend pt continue diet in conjunction with PEG tube feedings. Hopeful that intake will increase as cognition improves. Discussed with RN. SLP at SNF to f/u.    HPI HPI: Susan Calhoun is a 80 y.o. female with history of altered mental status following UTI, seen in the ED 08/31/2014 - admitted 09/01/2014 with confusion. Initial CT with L IVH. Repeat CT done during workup found to have worsening of IVH with bilateral occipital intraventricular blood with hydrocephalus. Hypertension and coumadin associated coagulapathy etiology of hemorrhage. Neuro surgery placed a Ventriculostomy drain for HCP. Since drain placement her mentation has not fully resolved. Repeat head CT shows no worsening of HCP or blood products. Questionable history of ventilation from 10/15 until? MD recommended PEG tube, family refused. Pt evaluated by SLP on 09/02/14, recommended to remain NPO due to cognitive deficits, pts arousal declined and SLP signed off.  BSE repeated 1-/30/15 with recommendation for NPO status. SLP in today for po trials. PEG placement pending.   Pertinent Vitals    SLP Plan  Continue with current plan of care    Recommendations Diet recommendations: Dysphagia 1 (puree);Thin liquid Liquids provided via: Cup;Straw Medication Administration: Via alternative means Supervision: Full supervision/cueing for compensatory strategies;Trained caregiver to feed patient Compensations: Slow rate;Small sips/bites Postural Changes and/or  Swallow Maneuvers: Seated upright 90 degrees;Upright 30-60 min after meal              General recommendations: Rehab consult Oral Care Recommendations: Oral care Q4 per protocol Follow up Recommendations: Inpatient Rehab Plan: Continue with current plan of care    GO    [facility] Township CampusBonnie Chisom Muntean, MA CCC-SLP [phone]  Claudine MoutonDeBlois, Syleena Mchan Caroline 09/25/2014, 10:51 AM

## 2014-09-25 NOTE — Progress Notes (Signed)
NUTRITION FOLLOW-UP  INTERVENTION: Change TF regimen to bolus feeds instead of continuous. Continue TF regimen until patient is consuming >25% of meals.  New TF regimen: Provide 270 ml of Vital AF 1.2 via PEG QID (after meals and once at night)  Provides 1296 kcal (99% of her minimum needs), 81 gm protein, 876 ml free water daily.  When IV fluids are discontinue, add 40 ml free water flush before and after each bolus feed to provide an additional 320 ml of fluid daily and a total of 1189 ml daily.   NUTRITION DIAGNOSIS: Inadequate oral intake related to inability to eat as evidenced by NPO status; ongoing.   Goal: Intake to meet >90% of estimated nutrition needs, met.  Monitor:  TF tolerance/adequacy, weight trend, labs  ASSESSMENT: Admitted to [facility] on 09/01/14 with working dx of UTI and acute encephalopathy. CT head was negative on admit. Had no improvement so MRI obtained on 10/14 showing: worsening hydrocephalus and layering material in the occipital horns of the lateral ventricles w/ question of hemorrhage raising concern for meningitis. Transferred to [facility] for further evaluation.   RD consulted to change TF regimen to bolus feedings. Pt was also re-assessed by SLP and advanced to a Dysphagia 1 diet with thin liquids. Pt received sips of juice and bites of pudding. At time of visit, lunch tray was at bedside untouched. Currently pt is receiving Vital AF 1.2 @ 45 ml/hr via PEG providing: 1296 kcal and 81 grams protein which is meeting needs at this time. Per nursing notes, recent residuals have been 0 ml to 10 ml. Pt's weight has been stable. Noted rectal tube with diarrhea at time of visit. Will continue Vital AF formula until diarrhea resolves then, switch to Glucerna 1.2 formula.    BUN elevated CBG's: 84-202 mg/dL Low Hemoglobin  Height: Ht Readings from Last 1 Encounters:  09/01/14 _0  (1.499 m)    Weight: Wt Readings from Last 1 Encounters:  09/15/14 146 lb 2.6 oz  (66.3 kg)  Weight trends: 121-142 lb Pt positive 1.4 L since admission  BMI:  Body mass index is 29.51 kg/(m^2).  Estimated Nutritional Needs: Kcal: 1300-1500 Protein: 65-80 gm Fluid: 1.5 L  Skin: ecchymosis left hip, rash right buttocks  Diet Order: DIET - DYS 1   Intake/Output Summary (Last 24 hours) at 09/25/14 1308 Last data filed at 09/25/14 0601  Gross per 24 hour  Intake     90 ml  Output    575 ml  Net   -485 ml    Last BM: 11/5  Labs:   Recent Labs Lab 09/21/14 0746 09/21/14 1830 09/22/14 0611  NA 145 146 144  K 5.1 5.0 5.3  CL 106 108 106  CO2 _1 BUN 28* 30* 34*  CREATININE 0.93 1.03 1.07  CALCIUM 8.5 8.4 8.6  GLUCOSE 160* 138* 145*    CBG (last 3)   Recent Labs  09/24/14 2226 09/25/14 0558 09/25/14 1155  GLUCAP 111* 147* 159*    Scheduled Meds: . amantadine  200 mg Oral BID  . amiodarone  200 mg Per Tube Daily  . antiseptic oral rinse  7 mL Mouth Rinse q12n4p  . chlorhexidine  15 mL Mouth Rinse BID  . diltiazem  30 mg Oral 4 times per day  . furosemide  40 mg Intravenous Daily  . insulin aspart  0-15 Units Subcutaneous Q6H  . latanoprost  1 drop Right Eye QHS  . metoprolol tartrate  75 mg Per  Tube BID  . pantoprazole sodium  40 mg Per Tube Daily    Continuous Infusions: . sodium chloride 500 mL (09/20/14 1935)  . sodium chloride 30 mL/hr at 09/17/14 0700  . feeding supplement (VITAL AF 1.2 CAL) 1,000 mL (09/25/14 0610)   Pryor Ochoa RD, LDN Inpatient Clinical Dietitian Pager: [phone] After Hours Pager: [phone]

## 2014-09-26 ENCOUNTER — Inpatient Hospital Stay (HOSPITAL_COMMUNITY): Payer: Medicare (Managed Care)

## 2014-09-26 DIAGNOSIS — R251 Tremor, unspecified: Secondary | ICD-10-CM

## 2014-09-26 LAB — GLUCOSE, CAPILLARY
GLUCOSE-CAPILLARY: 113 mg/dL — AB (ref 70–99)
GLUCOSE-CAPILLARY: 121 mg/dL — AB (ref 70–99)
GLUCOSE-CAPILLARY: 148 mg/dL — AB (ref 70–99)
Glucose-Capillary: 215 mg/dL — ABNORMAL HIGH (ref 70–99)

## 2014-09-26 MED ORDER — QUETIAPINE FUMARATE 25 MG PO TABS
12.5000 mg | ORAL_TABLET | Freq: Two times a day (BID) | ORAL | Status: DC
  Administered 2014-09-26 – 2014-09-29 (×7): 12.5 mg via ORAL
  Filled 2014-09-26 (×7): qty 1

## 2014-09-26 MED ORDER — METFORMIN HCL 500 MG PO TABS
500.0000 mg | ORAL_TABLET | Freq: Every day | ORAL | Status: DC
  Administered 2014-09-27 – 2014-09-29 (×3): 500 mg via ORAL
  Filled 2014-09-26 (×3): qty 1

## 2014-09-26 MED ORDER — METOPROLOL TARTRATE 25 MG/10 ML ORAL SUSPENSION: 75.0000 mg | Freq: Two times a day (BID) | ORAL | Status: DC

## 2014-09-26 MED ORDER — AMIODARONE HCL 200 MG PO TABS: 200.0000 mg | ORAL_TABLET | Freq: Every day | ORAL | Status: DC

## 2014-09-26 MED ORDER — VITAL AF 1.2 CAL PO LIQD: 270.0000 mL | Freq: Three times a day (TID) | ORAL | Status: AC

## 2014-09-26 MED ORDER — DILTIAZEM HCL 30 MG PO TABS: 30.0000 mg | ORAL_TABLET | Freq: Four times a day (QID) | ORAL | Status: DC

## 2014-09-26 MED ORDER — VITAMIN D 1000 UNITS PO TABS
1000.0000 [IU] | ORAL_TABLET | ORAL | Status: DC
  Administered 2014-09-26 – 2014-09-30 (×3): 1000 [IU] via ORAL
  Filled 2014-09-26 (×3): qty 1

## 2014-09-26 MED ORDER — PANTOPRAZOLE SODIUM 40 MG PO PACK: 40.0000 mg | PACK | Freq: Every day | ORAL | Status: DC

## 2014-09-26 MED ORDER — QUETIAPINE 12.5 MG HALF TABLET: 12.5000 mg | ORAL_TABLET | Freq: Two times a day (BID) | ORAL | Status: DC

## 2014-09-26 NOTE — Progress Notes (Signed)
STROKE TEAM PROGRESS NOTE   HISTORY Susan Calhoun is an 80 y.o. female who was noted by family to be acting confused > 2 weeks prior. Patient was brought to the hospital where she was diagnosed with a UTI. She was given Rocephin and sent home on Keflex. Due to not being awake enough to take her medications she was brought back to the hospital. MRI brain showed "layering material in the occipital horns of lateral ventricles, at least partly hemorrhagic. Question focal thrombus in the atrium of the right lateral ventricle, possibly associated with the choroid. This could be the site of hemorrhage". Infectious disease was consulted and they do not feel this is bacterial meningoencephalitis and ABX were D/C'd. Neuro surgery placed a Ventriculostomy drain for HCP. Since drain placement her mentation has not fully resolved. Repeat head CT shows no worsening of HCP or blood products. Neurology was asked to further evaluate. At time of evaluation, patient intubated on the vent, Temp 100.8, WBC 12.2. CSF: WBC 168, red blood cells 70000, lymph 7, neutrophile 85, glucose 78, protein 139   SUBJECTIVE (INTERVAL HISTORY) Daughter and grand daughter are at bedside. They are concerned that the patient is more tremulous and restless, and also not coherent as 2 days ago. SW working on SNF placement.   OBJECTIVE Temp:  [98.1 F (36.7 C)-99.7 F (37.6 C)] 99 F (37.2 C) (11/06 1745) Pulse Rate:  [78-123] 90 (11/06 1745) Cardiac Rhythm:  [-]  Resp:  [16-20] 20 (11/06 1745) BP: (107-127)/(36-99) 118/40 mmHg (11/06 1745) SpO2:  [96 %-99 %] 99 % (11/06 1745)   Recent Labs Lab 09/25/14 1659 09/25/14 2102 09/26/14 0035 09/26/14 0601 09/26/14 1138  GLUCAP 167* 157* 148* 121* 215*    Recent Labs Lab 09/20/14 0805 09/21/14 0746 09/21/14 1830 09/22/14 0611 09/25/14 1605  NA 142 145 146 144 146  K 3.9 5.1 5.0 5.3 3.6*  CL 104 106 108 106 105  CO2 26 27 25 25 26   GLUCOSE 141* 160* 138* 145* 149*  BUN  28* 28* 30* 34* 33*  CREATININE 1.00 0.93 1.03 1.07 1.27*  CALCIUM 8.3* 8.5 8.4 8.6 8.8   No results for input(s): AST, ALT, ALKPHOS, BILITOT, PROT, ALBUMIN in the last 168 hours.  Recent Labs Lab 09/21/14 0746 09/22/14 0611 09/25/14 1605  WBC 12.7* 12.3* 12.4*  HGB 9.8* 10.3* 9.9*  HCT 30.6* 32.8* 32.1*  MCV 95.3 97.0 97.6  PLT 320 269 284   No results for input(s): CKTOTAL, CKMB, CKMBINDEX, TROPONINI in the last 168 hours. No results for input(s): LABPROT, INR in the last 72 hours. No results for input(s): COLORURINE, LABSPEC, PHURINE, GLUCOSEU, HGBUR, BILIRUBINUR, KETONESUR, PROTEINUR, UROBILINOGEN, NITRITE, LEUKOCYTESUR in the last 72 hours.  Invalid input(s): APPERANCEUR     Component Value Date/Time   CHOL 204* 09/02/2014 0530   TRIG 149 09/02/2014 0530   HDL 64 09/02/2014 0530   CHOLHDL 3.2 09/02/2014 0530   VLDL 30 09/02/2014 0530   LDLCALC 110* 09/02/2014 0530   Lab Results  Component Value Date   HGBA1C 5.6 09/01/2014   I have personally reviewed the radiological images below and agree with the radiology interpretations.  Ct Head Wo Contrast 09/26/2014  1. Near complete resolution of layering blood products in the posterior horn of the lateral ventricles bilaterally. 2. Stable ventricular enlargement. 3. No new hemorrhage. 4. Stable atrophy and moderate white matter disease.  09/14/2014  1. Stable appearance of hydrocephalus involving the lateral and third ventricles with some residual layering blood products.  2. Persisting gas within the frontal horn of the right lateral ventricle following removal of the ventriculostomy catheter. 3. Stable mild diffuse white matter disease without evidence for acute infarct or significant interval change. 4. Stable focal hemorrhage within the left cerebral peduncle.  09/11/2014   Normal ventricular enlargement is stable. Intraventricular hemorrhage slightly improved. Small area of hemorrhage or calcification in the right  parietal lobe is stable.   09/08/2014  1. Unchanged dilatation of the third ventricle and likely no significant interval change in dilatation of the lateral ventricles. 2. Unchanged intraventricular blood. 3. Small volume of subarachnoid blood.     09/05/2014   1. Interval placement of right frontal approach ventriculostomy catheter with tip in the third ventricle. Ventricles remain dilated but are slightly decreased in size relative to previous MRI from 09/03/2014. 2. Layering hemorrhage within the occipital horns of the lateral ventricles bilaterally, stable from prior. 3. Question trace acute subarachnoid hemorrhage within the right sylvian fissure.      08/31/2014  Mild, likely age-appropriate, centralized volume loss without acute intracranial process.     [name] Contrast 09/12/14 Diffuse ventricular enlargement unchanged from CT scan yesterday. Findings suggest communicating hydrocephalus. Interval removal ventricular catheter with gas now in the right frontal horn. Intraventricular hemorrhage and subarachnoid hemorrhage appears stable. Negative for acute ischemic infarct.  09/03/2014    Acute hydrocephalus, worsened since 3 days ago. Layering material in the occipital horns of lateral ventricles, at least partly hemorrhagic. Question focal thrombus in the atrium of the right lateral ventricle, possibly associated with the choroid. This could be the site of hemorrhage. No specific vascular lesion is identified. No intraparenchymal hemorrhage. The differential diagnosis does include meningitis.    Dg Chest Port 1 View 09/17/2014 Central vascular congestion without convincing pulmonary edema. Persistent small bilateral pleural effusion with bilateral basilar atelectasis or infiltrate. Status post median sternotomy. Stable NG tube and left IJ central line position. 09/14/2014  Stable cardiomegaly with vascular congestion. Persistent mild bibasilar atelectasis, less likely airspace disease.  Small pleural effusions. 09/13/2014  Increasing bibasilar atelectasis or infiltrates. Suspect small effusions. 09/06/2014   Feeding tube with weighted tip in stomach.   09/04/2014    New left jugular central venous catheter tip overlies the distal SVC. No evidence of pneumothorax.  Stable cardiomegaly.  No active lung disease.    08/31/2014    Borderline cardiomegaly and lung hyperexpansion without acute cardiopulmonary disease.    EEG 09/11/14 : This is a markedly abnormal electroencephalogram secondary to general background slowing with frequent intermittent discharges of triphasic morphology. Although this may be seen with a severe metabolic encephalopathy can not rule out the possibility that this may represent epileptic phenomenon. Clinical correlation recommended.   2D echo 09/17/14 - Left ventricle: The cavity size was normal. Wall thickness was normal. Systolic function was normal. The estimated ejection fraction was in the range of 50% to 55%. Wall motion was normal; there were no regional wall motion abnormalities.  - Mitral valve: Calcified annulus. There was moderateregurgitation. - Left atrium: The atrium was moderately dilated. - Pulmonary arteries: PA peak pressure: 35 mm Hg (S).  Carotid Doppler  09/03/2014  - Mild technical difficulty due to movement of the arms and neck. - Right - 1% to 39% ICA stenosis. Vertebral artery flow is antegrade. - Left - 40% to 59% ICA stensosis. Vertebral artery flow isantegrade.   PHYSICAL EXAM Frail elderly Caucasian lady not in distress. Afebrile. Head is nontraumatic. Neck is supple without bruit.  Cardiac exam no murmur or gallop. Lungs are clear to auscultation. Distal pulses are well felt. Neurological Exam: The patient is laying in bed with eyes open. She is tremolus bilaterally hands, mild restless and intermittently agitated and tremulous. Mumbles words not coherent, not following commands. She did state that she is doing okay. She  tracks. Pupils exam showed left surgical pupil, left sluggish to light. Slight dysconjugate gaze with left eye hypotropia. Fundi could not be visualized. Face is symmetric. Tongue is midline. BLE and BLE against gravity. Left plantar upgoing right equivocal. Sensation and coordination cannot be reliably tested. Gait was not tested.   ASSESSMENT/PLAN Ms. Debbie Yearick is a 80 y.o. female with history of altered mental status following UTI, seen in the ED 08/31/2014 - admitted 09/01/2014 with confusion. Patient was on coumadin at that time for atrial fibrillation. INR 1.84. Administered Levaquin. BP elevated on the 11th at 166/44, max BP next day 189/112. Pt INR trending up and 2.10 on 09/03/14. In retrospect, initial CT with L IVH. Repeat CT done during workup found to have worsening of IVH with bilateral occipital intraventricular blood with hydrocephalus. Hypertension and coumadin associated coagulapathy etiology of hemorrhage. Over the course, with amantadine treatment, her mental status gradually getting better and more awake alert. As per family, about 2 days ago she is conversing, coherent but for the last two days pt more incoherent, decline, with tremor and restless and agitation. Repeat CT showed near resolution of IVH. Consider amantadine over stimulation, put on seroquel today.   Stroke:  Non-dominant IVH secondary to coumadin associated coagulopathy in setting of uncontrolled hypertension.  Intraventricular drain placed with no improvement of mental status.  CSF without source of infection.   EVD removed  SCDs for VTE prophylaxis  Up with assistance   warfarin prior to admission, no antithrombotics now given hemorrhage  Repeat CT today showed near resolution of the IVH  Resultant confusion, unable to follow commands mental status disproportionate to amount of IVH and CSF pressure.  Therapy recommendations: SNF  Disposition:  SNF - have bed tomorrow. If pt stable with improved  tremor and agitation, may consider SNF transfer.  AMS  - likely due to IVH and hydrocephalus  - LTM EEG did not detect seizure activity   - d/c vimpat and dilantin  - arousal improved on amantadine  - On  amantadine 200mg  bid - stopped 09/26/15  - more tremolous and restless and agitation and incoherent - consider amantadine over stimulation - add seroquel to antagonist dopamine  Atrial Fibrillation  Home meds:  Warfarin prior to admission  INR 1.51 on admission 10/12. High as 2.10 on 09/03/14.   No longer an anticoagulation candidate secondary to hemorrhage  Had wide complex tachycardia, cardiology consulted - see note 09/16/2014  Consider Afib with aberrancy/rate related bundle branch block.  Continue metoprolol 75mg  bid and cardiazem 30mg  q6   Continue lasix 40mg  daily  Continue on amiodarone  Repeat 2D echo unremarkable  High BNP but normal CEs  Cardiology recs appreciated.  D/c tele.  Dysphagia  - NPO now  - PANDA tube for tube feeding  - PEG declined by daughter on Friday but now agreeable to placement  PEG placed 11 3. Cleared for use on 11 4.  tube feedings resumed.  On bolus feeds, tolerating well.  Hypertension  Stable, on the higher side  On metoprolol, increase from 25mg  bid to 50mg  bid, and now 75mg  bid  Leukocytosis   - WBC trending 13.2->11.9 ->11.0 -> 10.2 ->  11.1 ->12.6->12.3  - cultures sent so far negative  - vancon/zosyn 7 day course completed  - afebrile  Hyperlipidemia  Home meds:  lipitor 80   LDL 110 goal < 70  D/c lipitor due to IVH  Other Stroke Risk Factors Advanced age . Obesity, Body mass index is 29.51 kg/(m^2).  Marland Kitchen. Coronary artery disease - s/p CABG ICA stenosis s/p CEA 2015 PVD  Other Active Problems  UTI with pyelonephritis - antibiotic course completed.   CKD   Anemia  C Diff specimen negative from 09/17/2014   Hospital day # 25  Marvel PlanJindong Hall Birchard, MD PhD Stroke Neurology 09/26/2014 8:48  PM     To contact Stroke Continuity provider, please refer to [email or web address]. After hours, contact General Neurology

## 2014-09-26 NOTE — Progress Notes (Signed)
Per discussion with pt family members, pt has had a steep mental decline since 11/4, she is no longer able to carry on conversation or identify objects on NIH scale.  She has had mumbled [place] speech, which is different per family, as she was holding conversation in [place] until then.  MD paged, STAT head CT ordered.  Family updated.  Pt still having tremors despite administration of Seraquel.

## 2014-09-26 NOTE — Clinical Social Work Note (Addendum)
CSW has talked with the family extensively regarding placement. CSW and family discussed [facility] is still negotion a Community education officercontract with the [employer] insurance company. The family is aware that once discharge is ordered the [facility]. Pt's family understand and has agreed to this plan. The family did expressed that once the contract has been approve for the SNF benefit they will requested the pt to be transfer to [facility]. CSW will continue to monitor and assist with discharge.   CSW spoke with MD pt will d/c tomorrow.   Addendum: Pt will transfer to [facility].   Galaxy Borden, MSW, LCSWA [phone]

## 2014-09-26 NOTE — Progress Notes (Signed)
Physical Therapy Treatment Patient Details Name: Susan Calhoun MRN: [id number] DOB: 09/24/1928 Today's Date: 09/26/2014    History of Present Illness 80 yo wf admitted on 09/01/14 with MS changes. Initial CT showed some ventriculomegaly. Was being treated for UTI. MS changes worsened and an MRI was performed revelaed IVH, Ventricular drain placed on 09/04/14. PMHx-DM with neuropathy, CAD, CABG, afib, R TKA, glaucome    PT Comments    Pt more alert and when fully supported upright in bed, she was talking in short phrases/sentences. As she began to attempt to move, she developed tremors in all 4 extremities which could only be quieted with rest/relaxation. Session very limited due to tremors and pt appearing very anxious when at EOB. Hopeful medication changes made by MD will resolve the tremors.   Follow Up Recommendations  SNF;Supervision/Assistance - 24 hour     Equipment Recommendations  None recommended by PT    Recommendations for Other Services       Precautions / Restrictions Precautions Precautions: Fall    Mobility  Bed Mobility Overal bed mobility: Needs Assistance;+ 2 for safety/equipment;+2 for physical assistance Bed Mobility: Rolling;Sidelying to Sit;Sit to Sidelying Rolling: Total assist;+2 for physical assistance Sidelying to sit: +2 for physical assistance;Total assist     Sit to sidelying: +2 for physical assistance;Total assist General bed mobility comments: At rest, no tremors noted; will any attempts to move, pt experiencing tremors x 4 extremities. Attempted EOB to see if weightbearing could assist with reducing tremors, however pt so tremulous, unable to attempt to use stedy for standing. Attempted neck, trunk ROM in sitting, however tremors became too severe to safely remain sitting at EOB.  Transfers                    Ambulation/Gait                 Stairs            Wheelchair Mobility    Modified Rankin (Stroke  Patients Only) Modified Rankin (Stroke Patients Only) Pre-Morbid Rankin Score: No symptoms Modified Rankin: Severe disability     Balance   Sitting-balance support: No upper extremity supported;Feet unsupported Sitting balance-Leahy Scale: Zero Sitting balance - Comments: tremors did not allow pt to weight bear through her UEs or LEs while sitting                            Cognition Arousal/Alertness: Awake/alert Behavior During Therapy: Restless;Anxious Overall Cognitive Status: Difficult to assess                      Exercises General Exercises - Lower Extremity Ankle Circles/Pumps:  (with heel cord stretch)    General Comments        Pertinent Vitals/Pain Pain Assessment: Faces Faces Pain Scale: No hurt    Home Living                      Prior Function            PT Goals (current goals can now be found in the care plan section) Acute Rehab PT Goals Patient Stated Goal: unable to state PT Goal Formulation: Patient unable to participate in goal setting Time For Goal Achievement: 10/10/14 Potential to Achieve Goals: Good Progress towards PT goals: Not progressing toward goals - comment (new tremors/med side effect)    Frequency  Min 2X/week    PT  Plan Current plan remains appropriate    Co-evaluation             End of Session   Activity Tolerance: Treatment limited secondary to medical complications (Comment) (tremors x 4 extremities) Patient left: in bed;with call bell/phone within reach;with bed alarm set;with nursing/sitter in room;with family/visitor present     Time: [id number] PT Time Calculation (min): 17 min  Charges:  $Therapeutic Activity: 8-22 mins                    G Codes:      Chriss Mannan 09/26/2014, 10:56 AM Pager [phone]

## 2014-09-27 LAB — GLUCOSE, CAPILLARY
GLUCOSE-CAPILLARY: 140 mg/dL — AB (ref 70–99)
Glucose-Capillary: 116 mg/dL — ABNORMAL HIGH (ref 70–99)
Glucose-Capillary: 142 mg/dL — ABNORMAL HIGH (ref 70–99)
Glucose-Capillary: 88 mg/dL (ref 70–99)

## 2014-09-27 MED ORDER — METOPROLOL TARTRATE 25 MG/10 ML ORAL SUSPENSION
25.0000 mg | Freq: Once | ORAL | Status: AC
  Administered 2014-09-27: 25 mg
  Filled 2014-09-27: qty 10

## 2014-09-27 MED ORDER — VITAL AF 1.2 CAL PO LIQD
1000.0000 mL | ORAL | Status: DC
  Filled 2014-09-27: qty 237
  Filled 2014-09-27 (×2): qty 1000

## 2014-09-27 MED ORDER — POTASSIUM CHLORIDE 20 MEQ/15ML (10%) PO SOLN
20.0000 meq | Freq: Every day | ORAL | Status: DC
  Administered 2014-09-27 – 2014-09-30 (×4): 20 meq
  Filled 2014-09-27 (×4): qty 15

## 2014-09-27 MED ORDER — FREE WATER
100.0000 mL | Freq: Three times a day (TID) | Status: DC
  Administered 2014-09-27 – 2014-09-29 (×5): 100 mL

## 2014-09-27 MED ORDER — METOPROLOL TARTRATE 25 MG/10 ML ORAL SUSPENSION
100.0000 mg | Freq: Two times a day (BID) | ORAL | Status: DC
  Administered 2014-09-27 – 2014-09-30 (×5): 100 mg
  Filled 2014-09-27 (×9): qty 40

## 2014-09-27 NOTE — Plan of Care (Signed)
Problem: Phase I Progression Outcomes Goal: Initial discharge plan identified Outcome: Progressing     

## 2014-09-27 NOTE — Progress Notes (Signed)
PA paged and notified about EKG.  Will continue to monitor patient.

## 2014-09-27 NOTE — Progress Notes (Signed)
STROKE TEAM PROGRESS NOTE   HISTORY Susan Calhoun is an 80 y.o. female who was noted by family to be acting confused > 2 weeks prior. Patient was brought to the hospital where she was diagnosed with a UTI. She was given Rocephin and sent home on Keflex. Due to not being awake enough to take her medications she was brought back to the hospital. MRI brain showed "layering material in the occipital horns of lateral ventricles, at least partly hemorrhagic. Question focal thrombus in the atrium of the right lateral ventricle, possibly associated with the choroid. This could be the site of hemorrhage". Infectious disease was consulted and they do not feel this is bacterial meningoencephalitis and ABX were D/C'd. Neuro surgery placed a Ventriculostomy drain for HCP. Since drain placement her mentation has not fully resolved. Repeat head CT shows no worsening of HCP or blood products. Neurology was asked to further evaluate. At time of evaluation, patient intubated on the vent, Temp 100.8, WBC 12.2. CSF: WBC 168, red blood cells 70000, lymph 7, neutrophile 85, glucose 78, protein 139   SUBJECTIVE (INTERVAL HISTORY) Daughter and granddaughter to bedside. The patient's tremors are improved today on Seroquel.    OBJECTIVE Temp:  [98.6 F (37 C)-99.9 F (37.7 C)] 98.6 F (37 C) (11/07 0536) Pulse Rate:  [78-97] 97 (11/07 0536) Cardiac Rhythm:  [-]  Resp:  [16-20] 20 (11/07 0536) BP: (99-128)/(36-70) 128/70 mmHg (11/07 0536) SpO2:  [97 %-100 %] 97 % (11/07 0536)   Recent Labs Lab 09/26/14 0601 09/26/14 1138 09/26/14 1652 09/27/14 0016 09/27/14 0542  GLUCAP 121* 215* 113* 142* 88    Recent Labs Lab 09/21/14 0746 09/21/14 1830 09/22/14 0611 09/25/14 1605  NA 145 146 144 146  K 5.1 5.0 5.3 3.6*  CL 106 108 106 105  CO2 27 25 25 26   GLUCOSE 160* 138* 145* 149*  BUN 28* 30* 34* 33*  CREATININE 0.93 1.03 1.07 1.27*  CALCIUM 8.5 8.4 8.6 8.8   No results for input(s): AST, ALT, ALKPHOS,  BILITOT, PROT, ALBUMIN in the last 168 hours.  Recent Labs Lab 09/21/14 0746 09/22/14 0611 09/25/14 1605  WBC 12.7* 12.3* 12.4*  HGB 9.8* 10.3* 9.9*  HCT 30.6* 32.8* 32.1*  MCV 95.3 97.0 97.6  PLT 320 269 284   No results for input(s): CKTOTAL, CKMB, CKMBINDEX, TROPONINI in the last 168 hours. No results for input(s): LABPROT, INR in the last 72 hours. No results for input(s): COLORURINE, LABSPEC, PHURINE, GLUCOSEU, HGBUR, BILIRUBINUR, KETONESUR, PROTEINUR, UROBILINOGEN, NITRITE, LEUKOCYTESUR in the last 72 hours.  Invalid input(s): APPERANCEUR     Component Value Date/Time   CHOL 204* 09/02/2014 0530   TRIG 149 09/02/2014 0530   HDL 64 09/02/2014 0530   CHOLHDL 3.2 09/02/2014 0530   VLDL 30 09/02/2014 0530   LDLCALC 110* 09/02/2014 0530   Lab Results  Component Value Date   HGBA1C 5.6 09/01/2014   I have personally reviewed the radiological images below and agree with the radiology interpretations.  Ct Head Wo Contrast 09/26/2014  1. Near complete resolution of layering blood products in the posterior horn of the lateral ventricles bilaterally. 2. Stable ventricular enlargement. 3. No new hemorrhage. 4. Stable atrophy and moderate white matter disease.  09/14/2014  1. Stable appearance of hydrocephalus involving the lateral and third ventricles with some residual layering blood products. 2. Persisting gas within the frontal horn of the right lateral ventricle following removal of the ventriculostomy catheter. 3. Stable mild diffuse white matter disease without evidence  for acute infarct or significant interval change. 4. Stable focal hemorrhage within the left cerebral peduncle.  09/11/2014   Normal ventricular enlargement is stable. Intraventricular hemorrhage slightly improved. Small area of hemorrhage or calcification in the right parietal lobe is stable.   09/08/2014  1. Unchanged dilatation of the third ventricle and likely no significant interval change in  dilatation of the lateral ventricles. 2. Unchanged intraventricular blood. 3. Small volume of subarachnoid blood.     09/05/2014   1. Interval placement of right frontal approach ventriculostomy catheter with tip in the third ventricle. Ventricles remain dilated but are slightly decreased in size relative to previous MRI from 09/03/2014. 2. Layering hemorrhage within the occipital horns of the lateral ventricles bilaterally, stable from prior. 3. Question trace acute subarachnoid hemorrhage within the right sylvian fissure.      08/31/2014  Mild, likely age-appropriate, centralized volume loss without acute intracranial process.     [name] Contrast 09/12/14 Diffuse ventricular enlargement unchanged from CT scan yesterday. Findings suggest communicating hydrocephalus. Interval removal ventricular catheter with gas now in the right frontal horn. Intraventricular hemorrhage and subarachnoid hemorrhage appears stable. Negative for acute ischemic infarct.  09/03/2014    Acute hydrocephalus, worsened since 3 days ago. Layering material in the occipital horns of lateral ventricles, at least partly hemorrhagic. Question focal thrombus in the atrium of the right lateral ventricle, possibly associated with the choroid. This could be the site of hemorrhage. No specific vascular lesion is identified. No intraparenchymal hemorrhage. The differential diagnosis does include meningitis.    Dg Chest Port 1 View 09/17/2014 Central vascular congestion without convincing pulmonary edema. Persistent small bilateral pleural effusion with bilateral basilar atelectasis or infiltrate. Status post median sternotomy. Stable NG tube and left IJ central line position. 09/14/2014  Stable cardiomegaly with vascular congestion. Persistent mild bibasilar atelectasis, less likely airspace disease. Small pleural effusions. 09/13/2014  Increasing bibasilar atelectasis or infiltrates. Suspect small effusions. 09/06/2014   Feeding  tube with weighted tip in stomach.   09/04/2014    New left jugular central venous catheter tip overlies the distal SVC. No evidence of pneumothorax.  Stable cardiomegaly.  No active lung disease.    08/31/2014    Borderline cardiomegaly and lung hyperexpansion without acute cardiopulmonary disease.    EEG 09/11/14 : This is a markedly abnormal electroencephalogram secondary to general background slowing with frequent intermittent discharges of triphasic morphology. Although this may be seen with a severe metabolic encephalopathy can not rule out the possibility that this may represent epileptic phenomenon. Clinical correlation recommended.   2D echo 09/17/14 - Left ventricle: The cavity size was normal. Wall thickness was normal. Systolic function was normal. The estimated ejection fraction was in the range of 50% to 55%. Wall motion was normal; there were no regional wall motion abnormalities.  - Mitral valve: Calcified annulus. There was moderateregurgitation. - Left atrium: The atrium was moderately dilated. - Pulmonary arteries: PA peak pressure: 35 mm Hg (S).  Carotid Doppler  09/03/2014  - Mild technical difficulty due to movement of the arms and neck. - Right - 1% to 39% ICA stenosis. Vertebral artery flow is antegrade. - Left - 40% to 59% ICA stensosis. Vertebral artery flow isantegrade.   PHYSICAL EXAM Frail elderly Caucasian lady not in distress. Afebrile. Head is nontraumatic. Neck is supple without bruit.  Cardiac exam no murmur or gallop. Lungs are clear to auscultation. Distal pulses are well felt. Neurological Exam: The patient is laying in bed with eyes open.  She is tremolus bilaterally hands, mild restless and intermittently agitated and tremulous. Mumbles words not coherent, not following commands. She did state that she is doing okay. She tracks. Pupils exam showed left surgical pupil, left sluggish to light. Slight dysconjugate gaze with left eye hypotropia. Fundi could not  be visualized. Face is symmetric. Tongue is midline. BLE and BLE against gravity. Left plantar upgoing right equivocal. Sensation and coordination cannot be reliably tested. Gait was not tested.   ASSESSMENT/PLAN Ms. Alandra Sando is a 80 y.o. female with history of altered mental status following UTI, seen in the ED 08/31/2014 - admitted 09/01/2014 with confusion. Patient was on coumadin at that time for atrial fibrillation. INR 1.84. Administered Levaquin. BP elevated on the 11th at 166/44, max BP next day 189/112. Pt INR trending up and 2.10 on 09/03/14. In retrospect, initial CT with L IVH. Repeat CT done during workup found to have worsening of IVH with bilateral occipital intraventricular blood with hydrocephalus. Hypertension and coumadin associated coagulapathy etiology of hemorrhage. Over the course, with amantadine treatment, her mental status gradually getting better and more awake alert. As per family, about 2 days ago she is conversing, coherent but for the last two days pt more incoherent, decline, with tremor and restless and agitation. Repeat CT showed near resolution of IVH. Consider amantadine over stimulation, put on seroquel today.   Stroke:  Non-dominant IVH secondary to coumadin associated coagulopathy in setting of uncontrolled hypertension.  Intraventricular drain placed with no improvement of mental status.  CSF without source of infection.   EVD removed  SCDs for VTE prophylaxis  Up with assistance   warfarin prior to admission, no antithrombotics now given hemorrhage  Repeat CT today showed near resolution of the IVH  Resultant confusion, unable to follow commands mental status disproportionate to amount of IVH and CSF pressure.  Therapy recommendations: SNF  Disposition:  SNF - have bed tomorrow. If pt stable with improved tremor and agitation, may consider SNF transfer.  AMS  - likely due to IVH and hydrocephalus  - LTM EEG did not detect seizure  activity   - d/c vimpat and dilantin  - arousal improved on amantadine  - On  amantadine 200mg  bid - stopped 09/26/15  - more tremolous and restless and agitation and incoherent - consider amantadine over stimulation - add seroquel to antagonist dopamine  Atrial Fibrillation  Home meds:  Warfarin prior to admission  INR 1.51 on admission 10/12. High as 2.10 on 09/03/14.   No longer an anticoagulation candidate secondary to hemorrhage  Had wide complex tachycardia, cardiology consulted - see note 09/16/2014  Consider Afib with aberrancy/rate related bundle branch block.  Continue metoprolol 75mg  bid and cardiazem 30mg  q6   Continue lasix 40mg  daily  Continue on amiodarone  Repeat 2D echo unremarkable  High BNP but normal CEs  Cardiology recs appreciated.  EKG obtained today secondary to Seroquel therapy - atrial fibrillation with rapid ventricular response.  Lopressor increased  Patient placed back on telemetry  Dysphagia  - NPO now  - PANDA tube for tube feeding  - PEG declined by daughter on Friday but now agreeable to placement  PEG placed 11 3. Cleared for use on 11 4.  tube feedings resumed.  On bolus feeds, tolerating well.  Add free water per PEG - creatinine creeping up  Hypertension  Stable, on the higher side  On metoprolol, increase from 25mg  bid to 50mg  bid, and now 75mg  bid  Leukocytosis   - WBC trending  13.2->11.9 ->11.0 -> 10.2 ->11.1 ->12.6->12.3  - cultures sent so far negative  - vancon/zosyn 7 day course completed  - afebrile  Hyperlipidemia  Home meds:  lipitor 80   LDL 110 goal < 70  D/c lipitor due to IVH  Other Stroke Risk Factors Advanced age . Obesity, Body mass index is 29.51 kg/(m^2).  Marland Kitchen. Coronary artery disease - s/p CABG ICA stenosis s/p CEA 2015 PVD  Other Active Problems  UTI with pyelonephritis - antibiotic course completed.   CKD   Anemia  C Diff specimen negative from  09/17/2014  Hypokalemia - supplement potassium   Hospital day # [address]  David Rinehuls PA-C [facility] Pager [phone] 09/27/2014, 9:33 AM   Family discussion. Mental status improved. Obtain EKG for QTC. Seroquel started. Possible d/c tomorrow or Monday Pauletta BrownsZEYLIKMAN, Diago Haik    To contact Stroke Continuity provider, please refer to [email or web address]. After hours, contact General Neurology

## 2014-09-28 ENCOUNTER — Inpatient Hospital Stay (HOSPITAL_COMMUNITY): Payer: Medicare (Managed Care)

## 2014-09-28 LAB — GLUCOSE, CAPILLARY
GLUCOSE-CAPILLARY: 113 mg/dL — AB (ref 70–99)
GLUCOSE-CAPILLARY: 157 mg/dL — AB (ref 70–99)
Glucose-Capillary: 116 mg/dL — ABNORMAL HIGH (ref 70–99)
Glucose-Capillary: 163 mg/dL — ABNORMAL HIGH (ref 70–99)

## 2014-09-28 MED ORDER — FUROSEMIDE 40 MG PO TABS
40.0000 mg | ORAL_TABLET | Freq: Every day | ORAL | Status: DC
  Administered 2014-09-28 – 2014-09-30 (×3): 40 mg
  Filled 2014-09-28 (×3): qty 1

## 2014-09-28 MED ORDER — CLONIDINE HCL 0.1 MG PO TABS
0.1000 mg | ORAL_TABLET | Freq: Two times a day (BID) | ORAL | Status: DC
  Administered 2014-09-28 – 2014-09-30 (×4): 0.1 mg
  Filled 2014-09-28 (×4): qty 1

## 2014-09-28 MED ORDER — QUETIAPINE FUMARATE 25 MG PO TABS
25.0000 mg | ORAL_TABLET | Freq: Once | ORAL | Status: AC
  Administered 2014-09-28: 25 mg
  Filled 2014-09-28: qty 1

## 2014-09-28 NOTE — Progress Notes (Signed)
STROKE TEAM PROGRESS NOTE   HISTORY Susan Calhoun is an 80 y.o. female who was noted by family to be acting confused > 2 weeks prior. Patient was brought to the hospital where she was diagnosed with a UTI. She was given Rocephin and sent home on Keflex. Due to not being awake enough to take her medications she was brought back to the hospital. MRI brain showed "layering material in the occipital horns of lateral ventricles, at least partly hemorrhagic. Question focal thrombus in the atrium of the right lateral ventricle, possibly associated with the choroid. This could be the site of hemorrhage". Infectious disease was consulted and they do not feel this is bacterial meningoencephalitis and ABX were D/C'd. Neuro surgery placed a Ventriculostomy drain for HCP. Since drain placement her mentation has not fully resolved. Repeat head CT shows no worsening of HCP or blood products. Neurology was asked to further evaluate. At time of evaluation, patient intubated on the vent, Temp 100.8, WBC 12.2. CSF: WBC 168, red blood cells 70000, lymph 7, neutrophile 85, glucose 78, protein 139   SUBJECTIVE (INTERVAL HISTORY)  On a.m. rounds the patient appeared to much more calm with less tremors. - has been on Seroquel 12.5 mg twice daily.  No family members were present. Later I was called back to see the patient again once the family had arrived. The patient appeared very agitated with significant tremors and constant motion. Family was very upset. Patient's blood pressure was 223/136. Axillary temperature 100.8. Discussed with Dr. Loretha Brasil who recommended additional Seroquel. The patient had pulled out her IV during the night so unable to give IV antihypertensive medications. She had not yet received her morning blood pressure medications so they were given per PEG tube. The family was also concerned about the patient's elevated temperature. Chest x-ray and urinalysis ordered. The patient gradually calmed down.   OBJECTIVE Temp:  [98 F (36.7 C)-100.8 F (38.2 C)] 99.9 F (37.7 C) (11/08 1256) Pulse Rate:  [65-117] 94 (11/08 1256) Cardiac Rhythm:  [-] Atrial fibrillation (11/08 0840) Resp:  [18-20] 18 (11/08 0941) BP: (104-223)/(45-159) 142/57 mmHg (11/08 1256) SpO2:  [95 %-98 %] 96 % (11/08 0941)   Recent Labs Lab 09/27/14 1204 09/27/14 1815 09/28/14 0013 09/28/14 0625 09/28/14 1155  GLUCAP 116* 140* 163* 116* 113*    Recent Labs Lab 09/21/14 1830 09/22/14 0611 09/25/14 1605  NA 146 144 146  K 5.0 5.3 3.6*  CL 108 106 105  CO2 25 25 26   GLUCOSE 138* 145* 149*  BUN 30* 34* 33*  CREATININE 1.03 1.07 1.27*  CALCIUM 8.4 8.6 8.8   No results for input(s): AST, ALT, ALKPHOS, BILITOT, PROT, ALBUMIN in the last 168 hours.  Recent Labs Lab 09/22/14 0611 09/25/14 1605  WBC 12.3* 12.4*  HGB 10.3* 9.9*  HCT 32.8* 32.1*  MCV 97.0 97.6  PLT 269 284   No results for input(s): CKTOTAL, CKMB, CKMBINDEX, TROPONINI in the last 168 hours. No results for input(s): LABPROT, INR in the last 72 hours. No results for input(s): COLORURINE, LABSPEC, PHURINE, GLUCOSEU, HGBUR, BILIRUBINUR, KETONESUR, PROTEINUR, UROBILINOGEN, NITRITE, LEUKOCYTESUR in the last 72 hours.  Invalid input(s): APPERANCEUR     Component Value Date/Time   CHOL 204* 09/02/2014 0530   TRIG 149 09/02/2014 0530   HDL 64 09/02/2014 0530   CHOLHDL 3.2 09/02/2014 0530   VLDL 30 09/02/2014 0530   LDLCALC 110* 09/02/2014 0530   Lab Results  Component Value Date   HGBA1C 5.6 09/01/2014   I  have personally reviewed the radiological images below and agree with the radiology interpretations.  Ct Head Wo Contrast 09/26/2014  1. Near complete resolution of layering blood products in the posterior horn of the lateral ventricles bilaterally. 2. Stable ventricular enlargement. 3. No new hemorrhage. 4. Stable atrophy and moderate white matter disease.  09/14/2014  1. Stable appearance of hydrocephalus involving the lateral  and third ventricles with some residual layering blood products. 2. Persisting gas within the frontal horn of the right lateral ventricle following removal of the ventriculostomy catheter. 3. Stable mild diffuse white matter disease without evidence for acute infarct or significant interval change. 4. Stable focal hemorrhage within the left cerebral peduncle.  09/11/2014   Normal ventricular enlargement is stable. Intraventricular hemorrhage slightly improved. Small area of hemorrhage or calcification in the right parietal lobe is stable.   09/08/2014  1. Unchanged dilatation of the third ventricle and likely no significant interval change in dilatation of the lateral ventricles. 2. Unchanged intraventricular blood. 3. Small volume of subarachnoid blood.     09/05/2014   1. Interval placement of right frontal approach ventriculostomy catheter with tip in the third ventricle. Ventricles remain dilated but are slightly decreased in size relative to previous MRI from 09/03/2014. 2. Layering hemorrhage within the occipital horns of the lateral ventricles bilaterally, stable from prior. 3. Question trace acute subarachnoid hemorrhage within the right sylvian fissure.      08/31/2014  Mild, likely age-appropriate, centralized volume loss without acute intracranial process.     [name] Contrast 09/12/14 Diffuse ventricular enlargement unchanged from CT scan yesterday. Findings suggest communicating hydrocephalus. Interval removal ventricular catheter with gas now in the right frontal horn. Intraventricular hemorrhage and subarachnoid hemorrhage appears stable. Negative for acute ischemic infarct.  09/03/2014    Acute hydrocephalus, worsened since 3 days ago. Layering material in the occipital horns of lateral ventricles, at least partly hemorrhagic. Question focal thrombus in the atrium of the right lateral ventricle, possibly associated with the choroid. This could be the site of hemorrhage. No specific  vascular lesion is identified. No intraparenchymal hemorrhage. The differential diagnosis does include meningitis.    Dg Chest Port 1 View 09/17/2014 Central vascular congestion without convincing pulmonary edema. Persistent small bilateral pleural effusion with bilateral basilar atelectasis or infiltrate. Status post median sternotomy. Stable NG tube and left IJ central line position. 09/14/2014  Stable cardiomegaly with vascular congestion. Persistent mild bibasilar atelectasis, less likely airspace disease. Small pleural effusions. 09/13/2014  Increasing bibasilar atelectasis or infiltrates. Suspect small effusions. 09/06/2014   Feeding tube with weighted tip in stomach.   09/04/2014    New left jugular central venous catheter tip overlies the distal SVC. No evidence of pneumothorax.  Stable cardiomegaly.  No active lung disease.    08/31/2014    Borderline cardiomegaly and lung hyperexpansion without acute cardiopulmonary disease.    EEG 09/11/14 : This is a markedly abnormal electroencephalogram secondary to general background slowing with frequent intermittent discharges of triphasic morphology. Although this may be seen with a severe metabolic encephalopathy can not rule out the possibility that this may represent epileptic phenomenon. Clinical correlation recommended.   2D echo 09/17/14 - Left ventricle: The cavity size was normal. Wall thickness was normal. Systolic function was normal. The estimated ejection fraction was in the range of 50% to 55%. Wall motion was normal; there were no regional wall motion abnormalities.  - Mitral valve: Calcified annulus. There was moderateregurgitation. - Left atrium: The atrium was moderately dilated. -  Pulmonary arteries: PA peak pressure: 35 mm Hg (S).  Carotid Doppler  09/03/2014  - Mild technical difficulty due to movement of the arms and neck. - Right - 1% to 39% ICA stenosis. Vertebral artery flow is antegrade. - Left - 40% to 59% ICA  stensosis. Vertebral artery flow isantegrade.   PHYSICAL EXAM NEUROLOGIC:   Alert but not following commands. Agitated and continuously moving - tremors more pronounced. Occasionally verbalizes; however she decided speak [place]. Unable to perform adequate exam. Heart - irregularly irregular. Lungs - clear.    ASSESSMENT/PLAN Ms. Veera Stapleton is a 80 y.o. female with history of altered mental status following UTI, seen in the ED 08/31/2014 - admitted 09/01/2014 with confusion. Patient was on coumadin at that time for atrial fibrillation. INR 1.84. Administered Levaquin. BP elevated on the 11th at 166/44, max BP next day 189/112. Pt INR trending up and 2.10 on 09/03/14. In retrospect, initial CT with L IVH. Repeat CT done during workup found to have worsening of IVH with bilateral occipital intraventricular blood with hydrocephalus. Hypertension and coumadin associated coagulapathy etiology of hemorrhage. Over the course, with amantadine treatment, her mental status gradually getting better and more awake alert. As per family, about 2 days ago she is conversing, coherent but for the last two days pt more incoherent, decline, with tremor and restless and agitation. Repeat CT showed near resolution of IVH. Consider amantadine over stimulation, put on seroquel Friday, 09/26/2014.  Stroke:  Non-dominant IVH secondary to coumadin associated coagulopathy in setting of uncontrolled hypertension.  Intraventricular drain placed with no improvement of mental status.  CSF without source of infection.   EVD removed  SCDs for VTE prophylaxis  Up with assistance   warfarin prior to admission, no antithrombotics now given hemorrhage  Repeat CT 09/26/2014 showed near resolution of the IVH  Resultant confusion, unable to follow commands mental status disproportionate to amount of IVH and CSF pressure.  Therapy recommendations: SNF  Disposition:  SNF - bed available.  If pt stable with improved  tremor and agitation, may consider SNF transfer.  AMS  - likely due to IVH and hydrocephalus  - LTM EEG did not detect seizure activity   - d/c vimpat and dilantin  - arousal improved on amantadine  - On  amantadine 200mg  bid - stopped 09/26/15  - more tremolous and restless and agitation and incoherent - consider amantadine over stimulation - add seroquel to antagonist dopamine  Atrial Fibrillation  Home meds:  Warfarin prior to admission  INR 1.51 on admission 10/12. High as 2.10 on 09/03/14.   No longer an anticoagulation candidate secondary to hemorrhage  Had wide complex tachycardia, cardiology consulted - see note 09/16/2014  Consider Afib with aberrancy/rate related bundle branch block.  Continue metoprolol 75mg  bid and cardiazem 30mg  q6   Continue lasix 40mg  daily  Continue on amiodarone  Repeat 2D echo unremarkable  High BNP but normal CEs  Cardiology recs appreciated.  EKG obtained 09/27/2014 secondary to Seroquel therapy - atrial fibrillation with rapid ventricular response.  Lopressor increased  Patient placed back on telemetry.  Additional dose of Seroquel given Sunday, 09/28/2014 for increased agitation.  Dysphagia  PEG declined by daughter initially but later agreed to placement.  PEG placed 11 3. Cleared for use on 11 4.  tube feedings resumed.  On bolus feeds, tolerating well.  Add free water per PEG - creatinine creeping up  Hypertension  Stable, on the higher side  On metoprolol, increase from 25mg  bid to 50mg   bid, and now 100 mg bid  Leukocytosis   - WBC trending 13.2->11.9 ->11.0 -> 10.2 ->11.1 ->12.6->12.3  - cultures sent so far negative  - vancon/zosyn 7 day course completed  - afebrile  Hyperlipidemia  Home meds:  lipitor 80   LDL 110 goal < 70  D/c lipitor due to IVH  Other Stroke Risk Factors Advanced age . Obesity, Body mass index is 29.51 kg/(m^2).  Marland Kitchen. Coronary artery disease - s/p CABG ICA stenosis s/p  CEA 2015 PVD  Other Active Problems  UTI with pyelonephritis - antibiotic course completed.   CKD   Anemia  C Diff specimen negative from 09/17/2014  Hypokalemia - supplement potassium  Urinalysis today - pending  Portable chest x-ray today - Mild cardiomegaly with central vascular congestion and trace pleural fluid.  Check CBC and bmet in a.m.  BP high ?  agitation. Will add catapres with parameters. IV access no longer available.   Hospital day # [id number]  David Rinehuls PA-C [facility] Pager [phone] 09/28/2014, 4:39 PM   QTC within normal limits on Seroquel. Increased agitation. Possible d/c in AM [place]      To contact Stroke Continuity provider, please refer to [email or web address]. After hours, contact General Neurology

## 2014-09-28 NOTE — Progress Notes (Signed)
PA paged and notified that patient is agitated and has tremors.  PA witnessed with the family, that the patient appeared very agitated with significant tremors and constant motion.  Family was very upset. Patient's blood pressure was 223/136. Axillary temperature 100.8.  Order received for chest x-ray, urinalysis and additional Seroquel dosage.  Will continue to monitor patient.

## 2014-09-29 DIAGNOSIS — R251 Tremor, unspecified: Secondary | ICD-10-CM | POA: Insufficient documentation

## 2014-09-29 DIAGNOSIS — I615 Nontraumatic intracerebral hemorrhage, intraventricular: Secondary | ICD-10-CM | POA: Insufficient documentation

## 2014-09-29 DIAGNOSIS — N390 Urinary tract infection, site not specified: Secondary | ICD-10-CM | POA: Insufficient documentation

## 2014-09-29 DIAGNOSIS — R531 Weakness: Secondary | ICD-10-CM | POA: Insufficient documentation

## 2014-09-29 DIAGNOSIS — R4182 Altered mental status, unspecified: Secondary | ICD-10-CM | POA: Insufficient documentation

## 2014-09-29 LAB — URINALYSIS, ROUTINE W REFLEX MICROSCOPIC
Bilirubin Urine: NEGATIVE
GLUCOSE, UA: NEGATIVE mg/dL
Ketones, ur: NEGATIVE mg/dL
Nitrite: POSITIVE — AB
Protein, ur: >300 mg/dL — AB
Specific Gravity, Urine: 1.021 (ref 1.005–1.030)
UROBILINOGEN UA: 0.2 mg/dL (ref 0.0–1.0)
pH: 5.5 (ref 5.0–8.0)

## 2014-09-29 LAB — BASIC METABOLIC PANEL WITH GFR
Anion gap: 11 (ref 5–15)
BUN: 66 mg/dL — ABNORMAL HIGH (ref 6–23)
CO2: 29 meq/L (ref 19–32)
Calcium: 9.1 mg/dL (ref 8.4–10.5)
Chloride: 122 meq/L — ABNORMAL HIGH (ref 96–112)
Creatinine, Ser: 1.46 mg/dL — ABNORMAL HIGH (ref 0.50–1.10)
GFR calc Af Amer: 37 mL/min — ABNORMAL LOW
GFR calc non Af Amer: 32 mL/min — ABNORMAL LOW
Glucose, Bld: 131 mg/dL — ABNORMAL HIGH (ref 70–99)
Potassium: 4.4 meq/L (ref 3.7–5.3)
Sodium: 162 meq/L — ABNORMAL HIGH (ref 137–147)

## 2014-09-29 LAB — CBC WITH DIFFERENTIAL/PLATELET
BASOS ABS: 0 10*3/uL (ref 0.0–0.1)
BASOS PCT: 0 % (ref 0–1)
Eosinophils Absolute: 0.3 10*3/uL (ref 0.0–0.7)
Eosinophils Relative: 4 % (ref 0–5)
HCT: 31.9 % — ABNORMAL LOW (ref 36.0–46.0)
Hemoglobin: 9.4 g/dL — ABNORMAL LOW (ref 12.0–15.0)
Lymphocytes Relative: 21 % (ref 12–46)
Lymphs Abs: 1.7 10*3/uL (ref 0.7–4.0)
MCH: 29.7 pg (ref 26.0–34.0)
MCHC: 29.5 g/dL — AB (ref 30.0–36.0)
MCV: 100.9 fL — ABNORMAL HIGH (ref 78.0–100.0)
Monocytes Absolute: 0.6 10*3/uL (ref 0.1–1.0)
Monocytes Relative: 7 % (ref 3–12)
NEUTROS PCT: 68 % (ref 43–77)
Neutro Abs: 5.5 10*3/uL (ref 1.7–7.7)
PLATELETS: 241 10*3/uL (ref 150–400)
RBC: 3.16 MIL/uL — AB (ref 3.87–5.11)
RDW: 17.7 % — ABNORMAL HIGH (ref 11.5–15.5)
WBC: 8.2 10*3/uL (ref 4.0–10.5)

## 2014-09-29 LAB — GLUCOSE, CAPILLARY
GLUCOSE-CAPILLARY: 110 mg/dL — AB (ref 70–99)
Glucose-Capillary: 121 mg/dL — ABNORMAL HIGH (ref 70–99)
Glucose-Capillary: 136 mg/dL — ABNORMAL HIGH (ref 70–99)
Glucose-Capillary: 194 mg/dL — ABNORMAL HIGH (ref 70–99)

## 2014-09-29 LAB — SODIUM: Sodium: 161 mEq/L — ABNORMAL HIGH (ref 137–147)

## 2014-09-29 LAB — URINE MICROSCOPIC-ADD ON

## 2014-09-29 MED ORDER — ATORVASTATIN CALCIUM 10 MG PO TABS: 20.0000 mg | ORAL_TABLET | Freq: Every day | ORAL | Status: DC

## 2014-09-29 MED ORDER — FREE WATER
300.0000 mL | Status: DC
  Administered 2014-09-29 – 2014-09-30 (×7): 300 mL

## 2014-09-29 MED ORDER — QUETIAPINE FUMARATE 25 MG PO TABS
12.5000 mg | ORAL_TABLET | Freq: Three times a day (TID) | ORAL | Status: DC
  Administered 2014-09-29 – 2014-09-30 (×3): 12.5 mg via ORAL
  Filled 2014-09-29 (×3): qty 1

## 2014-09-29 NOTE — Progress Notes (Signed)
STROKE TEAM PROGRESS NOTE   HISTORY Susan Calhoun is an 80 y.o. female who was noted by family to be acting confused > 2 weeks prior. Patient was brought to the hospital where she was diagnosed with a UTI. She was given Rocephin and sent home on Keflex. Due to not being awake enough to take her medications she was brought back to the hospital. MRI brain showed "layering material in the occipital horns of lateral ventricles, at least partly hemorrhagic. Question focal thrombus in the atrium of the right lateral ventricle, possibly associated with the choroid. This could be the site of hemorrhage". Infectious disease was consulted and they do not feel this is bacterial meningoencephalitis and ABX were D/C'd. Neuro surgery placed a Ventriculostomy drain for HCP. Since drain placement her mentation has not fully resolved. Repeat head CT shows no worsening of HCP or blood products. Neurology was asked to further evaluate. At time of evaluation, patient intubated on the vent, Temp 100.8, WBC 12.2. CSF: WBC 168, red blood cells 70000, lymph 7, neutrophile 85, glucose 78, protein 139   SUBJECTIVE (INTERVAL HISTORY) Daughter at bedside. Said yesterday was a bad day with agitation and behavior disturbance. Received extra dose of seroquel. Mental status much improved today. Answer some questions appropriately and follows some simple commands but very dysarthria.   OBJECTIVE Temp:  [98 F (36.7 C)-100.8 F (38.2 C)] 98.3 F (36.8 C) (11/09 0958) Pulse Rate:  [72-112] 72 (11/09 0958) Cardiac Rhythm:  [-]  Resp:  [16-18] 18 (11/09 0958) BP: (111-223)/(51-159) 114/81 mmHg (11/09 0958) SpO2:  [95 %-97 %] 96 % (11/09 0958)   Recent Labs Lab 09/28/14 0625 09/28/14 1155 09/28/14 1851 09/29/14 0044 09/29/14 0556  GLUCAP 116* 113* 157* 194* 121*    Recent Labs Lab 09/25/14 1605 09/29/14 0412 09/29/14 0816  NA 146 162* 161*  K 3.6* 4.4  --   CL 105 122*  --   CO2 26 29  --   GLUCOSE 149* 131*   --   BUN 33* 66*  --   CREATININE 1.27* 1.46*  --   CALCIUM 8.8 9.1  --    No results for input(s): AST, ALT, ALKPHOS, BILITOT, PROT, ALBUMIN in the last 168 hours.  Recent Labs Lab 09/25/14 1605 09/29/14 0412  WBC 12.4* 8.2  NEUTROABS  --  5.5  HGB 9.9* 9.4*  HCT 32.1* 31.9*  MCV 97.6 100.9*  PLT 284 241   No results for input(s): CKTOTAL, CKMB, CKMBINDEX, TROPONINI in the last 168 hours. No results for input(s): LABPROT, INR in the last 72 hours.  Recent Labs  09/29/14 0951  COLORURINE YELLOW  LABSPEC 1.021  PHURINE 5.5  GLUCOSEU NEGATIVE  HGBUR MODERATE*  BILIRUBINUR NEGATIVE  KETONESUR NEGATIVE  PROTEINUR >300*  UROBILINOGEN 0.2  NITRITE POSITIVE*  LEUKOCYTESUR MODERATE*       Component Value Date/Time   CHOL 204* 09/02/2014 0530   TRIG 149 09/02/2014 0530   HDL 64 09/02/2014 0530   CHOLHDL 3.2 09/02/2014 0530   VLDL 30 09/02/2014 0530   LDLCALC 110* 09/02/2014 0530   Lab Results  Component Value Date   HGBA1C 5.6 09/01/2014    Ct Head Wo Contrast 09/26/2014  1. Near complete resolution of layering blood products in the posterior horn of the lateral ventricles bilaterally. 2. Stable ventricular enlargement. 3. No new hemorrhage. 4. Stable atrophy and moderate white matter disease.  09/14/2014  1. Stable appearance of hydrocephalus involving the lateral and third ventricles with some residual layering blood  products. 2. Persisting gas within the frontal horn of the right lateral ventricle following removal of the ventriculostomy catheter. 3. Stable mild diffuse white matter disease without evidence for acute infarct or significant interval change. 4. Stable focal hemorrhage within the left cerebral peduncle.  09/11/2014   Normal ventricular enlargement is stable. Intraventricular hemorrhage slightly improved. Small area of hemorrhage or calcification in the right parietal lobe is stable.   09/08/2014  1. Unchanged dilatation of the third ventricle and  likely no significant interval change in dilatation of the lateral ventricles. 2. Unchanged intraventricular blood. 3. Small volume of subarachnoid blood.     09/05/2014   1. Interval placement of right frontal approach ventriculostomy catheter with tip in the third ventricle. Ventricles remain dilated but are slightly decreased in size relative to previous MRI from 09/03/2014. 2. Layering hemorrhage within the occipital horns of the lateral ventricles bilaterally, stable from prior. 3. Question trace acute subarachnoid hemorrhage within the right sylvian fissure.      08/31/2014  Mild, likely age-appropriate, centralized volume loss without acute intracranial process.     [name] Contrast 09/12/14 Diffuse ventricular enlargement unchanged from CT scan yesterday. Findings suggest communicating hydrocephalus. Interval removal ventricular catheter with gas now in the right frontal horn. Intraventricular hemorrhage and subarachnoid hemorrhage appears stable. Negative for acute ischemic infarct.  09/03/2014    Acute hydrocephalus, worsened since 3 days ago. Layering material in the occipital horns of lateral ventricles, at least partly hemorrhagic. Question focal thrombus in the atrium of the right lateral ventricle, possibly associated with the choroid. This could be the site of hemorrhage. No specific vascular lesion is identified. No intraparenchymal hemorrhage. The differential diagnosis does include meningitis.    Dg Chest Port 1 View 09/26/2014  Mild cardiomegaly with central vascular congestion and trace pleural fluid. 09/17/2014 Central vascular congestion without convincing pulmonary edema. Persistent small bilateral pleural effusion with bilateral basilar atelectasis or infiltrate. Status post median sternotomy. Stable NG tube and left IJ central line position. 09/14/2014  Stable cardiomegaly with vascular congestion. Persistent mild bibasilar atelectasis, less likely airspace disease. Small  pleural effusions. 09/13/2014  Increasing bibasilar atelectasis or infiltrates. Suspect small effusions. 09/06/2014   Feeding tube with weighted tip in stomach.   09/04/2014    New left jugular central venous catheter tip overlies the distal SVC. No evidence of pneumothorax.  Stable cardiomegaly.  No active lung disease.    08/31/2014    Borderline cardiomegaly and lung hyperexpansion without acute cardiopulmonary disease.    EEG 09/11/14 : This is a markedly abnormal electroencephalogram secondary to general background slowing with frequent intermittent discharges of triphasic morphology. Although this may be seen with a severe metabolic encephalopathy can not rule out the possibility that this may represent epileptic phenomenon. Clinical correlation recommended.   2D echo 09/17/14 - Left ventricle: The cavity size was normal. Wall thickness was normal. Systolic function was normal. The estimated ejection fraction was in the range of 50% to 55%. Wall motion was normal; there were no regional wall motion abnormalities.  - Mitral valve: Calcified annulus. There was moderateregurgitation. - Left atrium: The atrium was moderately dilated. - Pulmonary arteries: PA peak pressure: 35 mm Hg (S).  Carotid Doppler  09/03/2014  - Mild technical difficulty due to movement of the arms and neck. - Right - 1% to 39% ICA stenosis. Vertebral artery flow is antegrade. - Left - 40% to 59% ICA stensosis. Vertebral artery flow isantegrade.   PHYSICAL EXAM  Temp:  [id number][98  F (36.7 C)-98.6 F (37 C)] 98.1 F (36.7 C) (11/09 1406) Pulse Rate:  [72-84] 74 (11/09 1406) Resp:  [16-18] 18 (11/09 1406) BP: (114-157)/(51-84) 120/64 mmHg (11/09 1406) SpO2:  [95 %-97 %] 97 % (11/09 1406)  General - Well nourished, well developed, mild agitation and decreased attention and concentration, severe dysarthria.  Ophthalmologic - not cooperative on exam  Cardiovascular - irregularly irregular heart rate and  rhythm.  Neuro - awake, alert, greetings to provided, very dysarthric. She is able to follow some simple commands, such as close or open eyes, point to the ceiling, etc. Able to answer some questions with short answers appropriately, but has some spontaneous intelligible words. Not able to name or repeat but perseveration. PERRL, EOMI, facial symmetrial, tongue in midline, move all extremities spontanously but muscle tone increased bilaterally with cog wheeling and coarse action tremor. Coordination and gait not able to test.    ASSESSMENT/PLAN Susan Calhoun is a 80 y.o. female with history of altered mental status following UTI, seen in the ED 08/31/2014 - admitted 09/01/2014 with confusion. Patient was on coumadin at that time for atrial fibrillation. INR 1.84. Administered Levaquin. BP elevated on the 11th at 166/44, max BP next day 189/112. Pt INR trending up and 2.10 on 09/03/14. In retrospect, initial CT with L IVH. Repeat CT done during workup found to have worsening of IVH with bilateral occipital intraventricular blood with hydrocephalus. Hypertension and coumadin associated coagulapathy etiology of hemorrhage. Repeat CT showed near resolution of IVH.   Stroke:  Non-dominant IVH secondary to coumadin associated coagulopathy in setting of uncontrolled hypertension.  Intraventricular drain placed with no improvement of mental status.  CSF without source of infection.   EVD removed  SCDs for VTE prophylaxis  Up with assistance   warfarin prior to admission, no antithrombotics now given hemorrhage  Repeat CT 09/26/2014 showed near resolution of the IVH  Resultant confusion, unable to follow commands mental status disproportionate to amount of IVH and CSF pressure.  Therapy recommendations: SNF  Disposition:  SNF - bed available.  Hopeful discharge tomorrow.  AMS  - likely due to IVH and hydrocephalus  - LTM EEG did not detect seizure activity   - d/c vimpat and  dilantin  - started On  amantadine 200mg  bid - stopped 09/26/15  - arousal improved on amantadine, however over the course  with amantadine treatment, her mental status gradually getting better and more awake alert. As per family, patient became conversant, coherent but then pt more incoherent, decline, with tremor and restless and agitation. Patient felt to have amantadine over stimulation, so it was discontinued, put on seroquel Friday, 09/26/2014 to counteract  Additional dose of Seroquel given Sunday, 09/28/2014 for increased agitation.   QTC within normal limits on Seroquel.   Now increased seroquel to 12.5mg  tid.  Atrial Fibrillation  Home meds:  Warfarin prior to admission  INR 1.51 on admission 10/12. High as 2.10 on 09/03/14.   No longer an anticoagulation candidate secondary to hemorrhage  Had wide complex tachycardia, cardiology consulted - see note 09/16/2014  Consider Afib with aberrancy/rate related bundle branch block.  Continue metoprolol 75mg  bid and cardiazem 30mg  q6   Continue lasix 40mg  daily  Continue on amiodarone  Repeat 2D echo unremarkable  High BNP but normal CEs  Cardiology recs appreciated.  EKG obtained 09/27/2014 secondary to Seroquel therapy - atrial fibrillation with rapid ventricular response.  Lopressor increased  Patient placed back on telemetry at that time  Dysphagia  PEG  declined by daughter initially but later agreed to placement.  PEG placed 11/3. Cleared for use on 11/4.  tube feedings resumed.  On bolus feeds, tolerating well.  Sodium 161and creatinine elevated, likely due to dehydration given no IV access as well as patient being on Lasix.  free water increased to Q4.  Monitor labs  Hypertension  Stable, on the higher side  Increased blood pressure during time of agitation. Medication adjustment.  On metoprolol, increase from 25mg  bid to 50mg  bid, and now 100 mg bid  Leukocytosis   - WBC trending  13.2->11.9 ->11.0 -> 10.2 ->11.1 ->12.6->12.3->8.2  - cultures sent so far negative  - vancon/zosyn 7 day course completed  - afebrile  Hyperlipidemia  Home meds:  lipitor 80   LDL 110 goal < 70  Resume lipitor at low dose 20mg    Other Stroke Risk Factors Advanced age . Obesity, Body mass index is 29.51 kg/(m^2).  Marland Kitchen Coronary artery disease - s/p CABG ICA stenosis s/p CEA 2015 PVD  Other Active Problems  UTI with pyelonephritis - antibiotic course completed.   CKD   Anemia  C Diff specimen negative from 09/17/2014  Hypokalemia - supplement potassium  Urinalysis today - 11-20 WBC, few bacteria. We'll consider urine culture/Cipro  Portable chest x-ray yesterday - Mild cardiomegaly with central vascular congestion and trace pleural fluid - repeat in am.   Hospital day # [id number] BIBY, MSN, APRN, ANVP-BC, AGPCNP-BC [facility] Pager: [phone] 09/29/2014 11:09 AM   I, the attending vascular neurologist, have personally obtained a history, examined the patient, evaluated laboratory data, individually viewed imaging studies, and formulated the assessment and plan of care.  I have made any additions or clarifications directly to the above note and agree with the findings and plan as currently documented.   Please note: All text in blue color in the note is my addition to the original note.   Marvel Plan, MD PhD Stroke Neurology 09/29/2014 6:42 PM    To contact Stroke Continuity provider, please refer to [email or web address]. After hours, contact General Neurology

## 2014-09-29 NOTE — Progress Notes (Signed)
PHARMACIST - PHYSICIAN COMMUNICATION DR:  Roda ShuttersXu CONCERNING:  METFORMIN SAFE ADMINISTRATION POLICY  RECOMMENDATION: Metformin has been placed on DISCONTINUE (rejected order) STATUS and should be reordered only after any of the conditions below are ruled out.  Current safety recommendations include avoiding metformin for a minimum of 48 hours after the patient's exposure to intravenous contrast media.  DESCRIPTION:  The Pharmacy Committee has adopted a policy that restricts the use of metformin in hospitalized patients until all the contraindications to administration have been ruled out. Specific contraindications are: []  Serum creatinine ? 1.5 for males [x]  Serum creatinine ? 1.4 for females []  Shock, acute MI, sepsis, hypoxemia, dehydration []  Planned administration of intravenous iodinated contrast media []  Heart Failure patients with low EF []  Acute or chronic metabolic acidosis (including DKA)      Georgina PillionElizabeth Rabecka Brendel, PharmD, BCPS Clinical Pharmacist Pager: [phone] 09/29/2014 3:28 PM

## 2014-09-29 NOTE — Progress Notes (Signed)
Speech Language Pathology Treatment: Dysphagia  Patient Details Name: Susan Calhoun MRN: [id number] DOB: 03/22/1928 Today's Date: 09/29/2014 Time: [id number] SLP Time Calculation (min): 17 min  Assessment / Plan / Recommendation Clinical Impression  Continued f/u for dysphagia: pt more alert today, stating name, engaging in basic social conversation.  Dtr present.  The physical assist needed to adjust pt in bed for PO intake evoked tremors - after a few minutes they passed and pt was able to participate in self-feeding (max assist) and PO intake.  Consumed water and yogurt with sufficient attention, likely delay in initiation, and mild throat-clearing, but overall with mod assist/cues pt appeared to tolerate well with good motivation to eat.  Recommend continuing dysphagia 1/thin liquid diet to supplement PEG feedings.  SLP will follow.     HPI HPI: [name] is a 80 y.o. female with history of altered mental status following UTI, seen in the ED 08/31/2014 - admitted 09/01/2014 with confusion. Initial CT with L IVH. Repeat CT done during workup found to have worsening of IVH with bilateral occipital intraventricular blood with hydrocephalus. Hypertension and coumadin associated coagulapathy etiology of hemorrhage. Neuro surgery placed a Ventriculostomy drain for HCP. Since drain placement her mentation has not fully resolved. Repeat head CT shows no worsening of HCP or blood products. Questionable history of ventilation from 10/15 until? MD recommended PEG tube, family refused. Pt evaluated by SLP on 09/02/14, recommended to remain NPO due to cognitive deficits, pts arousal declined and SLP signed off.  BSE repeated 1-/30/15 with recommendation for NPO status. SLP in today for po trials. PEG placement pending.   Pertinent Vitals Pain Assessment: No/denies pain  SLP Plan  Continue with current plan of care    Recommendations Diet recommendations: Dysphagia 1 (puree);Thin liquid Liquids  provided via: Cup Medication Administration: Via alternative means Supervision: Full supervision/cueing for compensatory strategies;Trained caregiver to feed patient Compensations: Slow rate;Small sips/bites Postural Changes and/or Swallow Maneuvers: Seated upright 90 degrees;Upright 30-60 min after meal              Oral Care Recommendations: Oral care BID Follow up Recommendations: Skilled Nursing facility Plan: Continue with current plan of care    GO     Blenda MountsCouture, Susan Granada Laurice 09/29/2014, 11:40 AM

## 2014-09-29 NOTE — Progress Notes (Signed)
Pt ate 20 bites of her dinner, including chicken, green beans, and potatoes. She also ate another 10 bites of ice cream.

## 2014-09-29 NOTE — Progress Notes (Signed)
UR COMPLETED  

## 2014-09-30 ENCOUNTER — Inpatient Hospital Stay (HOSPITAL_COMMUNITY): Payer: Medicare (Managed Care)

## 2014-09-30 DIAGNOSIS — N3000 Acute cystitis without hematuria: Secondary | ICD-10-CM

## 2014-09-30 DIAGNOSIS — E46 Unspecified protein-calorie malnutrition: Secondary | ICD-10-CM

## 2014-09-30 LAB — CBC
HCT: 32 % — ABNORMAL LOW (ref 36.0–46.0)
HEMOGLOBIN: 9.5 g/dL — AB (ref 12.0–15.0)
MCH: 30.4 pg (ref 26.0–34.0)
MCHC: 29.7 g/dL — AB (ref 30.0–36.0)
MCV: 102.2 fL — ABNORMAL HIGH (ref 78.0–100.0)
PLATELETS: 241 10*3/uL (ref 150–400)
RBC: 3.13 MIL/uL — ABNORMAL LOW (ref 3.87–5.11)
RDW: 17.6 % — ABNORMAL HIGH (ref 11.5–15.5)
WBC: 8.9 10*3/uL (ref 4.0–10.5)

## 2014-09-30 LAB — BASIC METABOLIC PANEL
Anion gap: 14 (ref 5–15)
BUN: 60 mg/dL — ABNORMAL HIGH (ref 6–23)
CO2: 26 mEq/L (ref 19–32)
Calcium: 8.8 mg/dL (ref 8.4–10.5)
Chloride: 121 mEq/L — ABNORMAL HIGH (ref 96–112)
Creatinine, Ser: 1.37 mg/dL — ABNORMAL HIGH (ref 0.50–1.10)
GFR calc Af Amer: 40 mL/min — ABNORMAL LOW (ref >90)
GFR calc non Af Amer: 34 mL/min — ABNORMAL LOW (ref >90)
GLUCOSE: 122 mg/dL — AB (ref 70–99)
Potassium: 4.4 mEq/L (ref 3.7–5.3)
SODIUM: 161 meq/L — AB (ref 137–147)

## 2014-09-30 LAB — GLUCOSE, CAPILLARY
GLUCOSE-CAPILLARY: 180 mg/dL — AB (ref 70–99)
Glucose-Capillary: 113 mg/dL — ABNORMAL HIGH (ref 70–99)
Glucose-Capillary: 141 mg/dL — ABNORMAL HIGH (ref 70–99)

## 2014-09-30 MED ORDER — POTASSIUM CHLORIDE 20 MEQ/15ML (10%) PO SOLN: 20.0000 meq | Freq: Every day | ORAL | Status: DC

## 2014-09-30 MED ORDER — CIPROFLOXACIN HCL 250 MG PO TABS: 250.0000 mg | ORAL_TABLET | Freq: Two times a day (BID) | ORAL | Status: DC

## 2014-09-30 MED ORDER — ENSURE COMPLETE PO LIQD: 237.0000 mL | Freq: Three times a day (TID) | ORAL | Status: DC

## 2014-09-30 MED ORDER — QUETIAPINE 12.5 MG HALF TABLET: 12.5000 mg | ORAL_TABLET | Freq: Three times a day (TID) | ORAL | Status: DC

## 2014-09-30 MED ORDER — ENSURE COMPLETE PO LIQD: 237.0000 mL | Freq: Three times a day (TID) | ORAL | Status: AC

## 2014-09-30 MED ORDER — ATORVASTATIN CALCIUM 20 MG PO TABS: 20.0000 mg | ORAL_TABLET | Freq: Every day | ORAL | Status: DC

## 2014-09-30 MED ORDER — METOPROLOL TARTRATE 25 MG/10 ML ORAL SUSPENSION: 100.0000 mg | Freq: Two times a day (BID) | ORAL | Status: DC

## 2014-09-30 MED ORDER — FREE WATER: 300.0000 mL | Status: DC

## 2014-09-30 MED ORDER — CLONIDINE HCL 0.1 MG PO TABS: 0.1000 mg | ORAL_TABLET | Freq: Two times a day (BID) | ORAL | Status: DC

## 2014-09-30 NOTE — Progress Notes (Signed)
Report called to nurse at SNF with all questions answered @ 1600. Pt D/C'd to SNF via ambulance @ 1745 per MD order. Pt is stable @ D/C and has no other needs at this time. Pt's IV was removed prior to D/C. Rema FendtAshley Tifany Hirsch, RN

## 2014-09-30 NOTE — Progress Notes (Signed)
Physical Therapy Treatment Patient Details Name: Susan Calhoun MRN: [id number] DOB: 01/12/1928 Today's Date: 09/30/2014    History of Present Illness 80 yo wf admitted on 09/01/14 with MS changes. Initial CT showed some ventriculomegaly. Was being treated for UTI. MS changes worsened and an MRI was performed revelaed IVH, Ventricular drain placed on 09/04/14. PMHx-DM with neuropathy, CAD, CABG, afib, R TKA, glaucome    PT Comments    Patient able to sit EOB and work on sitting balance/tolerance. Patient with minimal verbalizations with therapist this session but she was awake and eyes open throughout. Noted some shaking of R UE after sitting ~10 mins so returned patient to sidelying. Continue to recommend SNF for ongoing Physical Therapy.     Follow Up Recommendations  SNF;Supervision/Assistance - 24 hour     Equipment Recommendations  None recommended by PT    Recommendations for Other Services       Precautions / Restrictions Precautions Precautions: Fall Restrictions Weight Bearing Restrictions: No    Mobility  Bed Mobility Overal bed mobility: Needs Assistance;+ 2 for safety/equipment;+2 for physical assistance   Rolling: +2 for physical assistance;Mod assist Sidelying to sit: +2 for physical assistance;Total assist   Sit to supine: Total assist;+2 for physical assistance   General bed mobility comments: No tremors noted this session with bed mobility. A for all aspects required especailly as patient pushing back on initial trunk facilitation into sitting. A for all aspects back into bed and patient positioned in sidelying with pillows to promote L rotation  Transfers                    Ambulation/Gait                 Stairs            Wheelchair Mobility    Modified Rankin (Stroke Patients Only)       Balance     Sitting balance-Leahy Scale: Poor Sitting balance - Comments: Initially patient able to sit with Min A at time with  both UE supported on chair in front of patient. When unilateral assist attempted, patient with heavy posterior lean requiring total A to correct.  Postural control: Posterior lean                          Cognition Arousal/Alertness: Awake/alert Behavior During Therapy: Restless;Anxious Overall Cognitive Status: Difficult to assess Area of Impairment: Attention;Following commands       Following Commands: Follows one step commands inconsistently;Follows one step commands with increased time            Exercises      General Comments        Pertinent Vitals/Pain Pain Assessment: No/denies pain    Home Living                      Prior Function            PT Goals (current goals can now be found in the care plan section) Progress towards PT goals: Progressing toward goals    Frequency  Min 2X/week    PT Plan Current plan remains appropriate    Co-evaluation             End of Session   Activity Tolerance: Patient limited by fatigue Patient left: in bed;with call bell/phone within reach;with bed alarm set     Time: [id number] PT Time Calculation (min) (ACUTE ONLY): 16  min  Charges:  $Therapeutic Activity: 8-22 mins                    G Codes:      Fredrich BirksRobinette, Julia Elizabeth 09/30/2014, 10:24 AM 09/30/2014 Fredrich Birksobinette, Julia Elizabeth PTA [phone] pager [phone] office

## 2014-09-30 NOTE — Progress Notes (Signed)
Speech Language Pathology Treatment: Dysphagia  Patient Details Name: Susan Calhoun MRN: [id number] DOB: 02/23/1928 Today's Date: 09/30/2014 Time: 1344-1400 SLP Time Calculation (min) (ACUTE ONLY): 16 min  Assessment / Plan / Recommendation Clinical Impression  Pt is tolerating dysphagia 1(puree) diet with thin liquids. SLP provided moderate assistance and verbal cueing for attention to PO's. Swallow function was consistent with baseline (eructation after each sip due to air intake) with one immediate and sustained series of coughing produced after last sip of thin liquids, likely due to decreased awareness and coordination of bolus. SLP discussed prognosis of diet advancement and follow up with SLP in the next venue. Will continue to follow.   HPI HPI: Susan Calhoun is a 80 y.o. female with history of altered mental status following UTI, seen in the ED 08/31/2014 - admitted 09/01/2014 with confusion. Initial CT with L IVH. Repeat CT done during workup found to have worsening of IVH with bilateral occipital intraventricular blood with hydrocephalus. Hypertension and coumadin associated coagulapathy etiology of hemorrhage. Neuro surgery placed a Ventriculostomy drain for HCP. Since drain placement her mentation has not fully resolved. Repeat head CT shows no worsening of HCP or blood products. Questionable history of ventilation from 10/15 until? MD recommended PEG tube, family refused. Pt evaluated by SLP on 09/02/14, recommended to remain NPO due to cognitive deficits, pts arousal declined and SLP signed off.  BSE repeated 1-/30/15 with recommendation for NPO status. SLP in today for po trials. PEG placement pending.   Pertinent Vitals Pain Assessment: No/denies pain  SLP Plan  Continue with current plan of care    Recommendations Diet recommendations: Dysphagia 1 (puree);Thin liquid Liquids provided via: Cup;No straw Medication Administration: Via alternative means Supervision: Full  supervision/cueing for compensatory strategies;Trained caregiver to feed patient Compensations: Slow rate;Small sips/bites Postural Changes and/or Swallow Maneuvers: Seated upright 90 degrees;Upright 30-60 min after meal              Oral Care Recommendations: Oral care BID Follow up Recommendations: Skilled Nursing facility Plan: Continue with current plan of care    GO     Barkley BrunsO'Brien, Katherine 09/30/2014, 2:04 PM

## 2014-09-30 NOTE — Plan of Care (Signed)
Problem: SLP Dysphagia Goals Goal: Patient will utilize recommended strategies Patient will utilize recommended strategies during swallow to increase swallowing safety with  Outcome: Progressing     

## 2014-09-30 NOTE — Progress Notes (Signed)
NUTRITION FOLLOW UP  Intervention:   - Ensure Complete po TID, each supplement provides 350 kcal and 13 grams of protein - Recommend, if pt does not eat greater than 50% of meal, and does not drink nutritional supplement, then bolus supplement into PEG tube. - RD will continue to monitor.  Nutrition Dx:   Inadequate oral intake related to inability to eat as evidenced by NPO status; ongoing.   Goal:   Pt to meet >/= 90% of their estimated nutrition needs   Monitor:   Weight trend, po intake, TF tolerance, labs  Assessment:   Admitted to [facility] on 09/01/14 with working dx of UTI and acute encephalopathy. CT head was negative on admit. Had no improvement so MRI obtained on 10/14 showing: worsening hydrocephalus and layering material in the occipital horns of the lateral ventricles w/ question of hemorrhage raising concern for meningitis. [facility].   Spoke with RN and pt's daughter. Pt has had improvement in meal intake. She ate 50% of her breakfast this morning and has eaten up to 100%. She is drinking plenty of fluids. Pt has not been receiving nutritional supplements, but her daughter feels that she would do well with them. Recommend bolusing nutritional supplements if meal intake is <50% and pt does not drink supplements by mouth.   Height: Ht Readings from Last 1 Encounters:  09/01/14 4\' 11"  (1.499 m)    Weight Status:   Wt Readings from Last 1 Encounters:  09/15/14 146 lb 2.6 oz (66.3 kg)    Re-estimated needs:  Kcal: 1300-1500 Protein: 65-80 g Fluid: 1.5 L/day  Skin: ecchymosis left hip, rash right buttocks  Diet Order: DIET - DYS 1   Intake/Output Summary (Last 24 hours) at 09/30/14 1211 Last data filed at 09/30/14 1045  Gross per 24 hour  Intake    570 ml  Output    550 ml  Net     20 ml    Last BM: 11/10   Labs:   Recent Labs Lab 09/25/14 1605 09/29/14 0412 09/29/14 0816 09/30/14 0823  NA 146 162* 161* 161*  K 3.6* 4.4   --  4.4  CL 105 122*  --  121*  CO2 26 29  --  26  BUN 33* 66*  --  60*  CREATININE 1.27* 1.46*  --  1.37*  CALCIUM 8.8 9.1  --  8.8  GLUCOSE 149* 131*  --  122*    CBG (last 3)   Recent Labs  09/30/14 0020 09/30/14 0459 09/30/14 1135  GLUCAP 141* 113* 180*    Scheduled Meds: . amiodarone  200 mg Per Tube Daily  . antiseptic oral rinse  7 mL Mouth Rinse q12n4p  . atorvastatin  20 mg Oral q1800  . chlorhexidine  15 mL Mouth Rinse BID  . cholecalciferol  1,000 Units Oral QODAY  . cloNIDine  0.1 mg Per Tube BID  . diltiazem  30 mg Oral 4 times per day  . feeding supplement (VITAL AF 1.2 CAL)  270 mL Per Tube TID PC & HS  . free water  300 mL Per Tube Q4H  . furosemide  40 mg Per Tube Daily  . insulin aspart  0-15 Units Subcutaneous Q6H  . latanoprost  1 drop Right Eye QHS  . metoprolol tartrate  100 mg Per Tube BID  . pantoprazole sodium  40 mg Per Tube Daily  . potassium chloride  20 mEq Per Tube Daily  . QUEtiapine  12.5  mg Oral TID    Continuous Infusions:   Emmaline KluverHaley Teigen Bellin MS, RD, LDN

## 2014-09-30 NOTE — Plan of Care (Signed)
Problem: SLP Dysphagia Goals Goal: Patient will demonstrate readiness for PO's Patient will demonstrate readiness for PO's and/or instrumental swallow study as evidenced by:  Outcome: Progressing     

## 2014-10-08 ENCOUNTER — Emergency Department (HOSPITAL_COMMUNITY): Payer: Medicare (Managed Care)

## 2014-10-08 ENCOUNTER — Encounter (HOSPITAL_COMMUNITY): Payer: Self-pay | Admitting: *Deleted

## 2014-10-08 ENCOUNTER — Inpatient Hospital Stay (HOSPITAL_COMMUNITY)
Admission: EM | Admit: 2014-10-08 | Discharge: 2014-10-21 | DRG: 071 | Disposition: A | Discharge status: Skilled Nursing Facility | Payer: Medicare (Managed Care) | Attending: Internal Medicine | Admitting: Internal Medicine

## 2014-10-08 ENCOUNTER — Telehealth: Payer: Self-pay | Admitting: Neurology

## 2014-10-08 DIAGNOSIS — K567 Ileus, unspecified: Secondary | ICD-10-CM

## 2014-10-08 DIAGNOSIS — N390 Urinary tract infection, site not specified: Secondary | ICD-10-CM

## 2014-10-08 DIAGNOSIS — Z66 Do not resuscitate: Secondary | ICD-10-CM | POA: Diagnosis present

## 2014-10-08 DIAGNOSIS — H409 Unspecified glaucoma: Secondary | ICD-10-CM | POA: Diagnosis present

## 2014-10-08 DIAGNOSIS — E1151 Type 2 diabetes mellitus with diabetic peripheral angiopathy without gangrene: Secondary | ICD-10-CM | POA: Diagnosis present

## 2014-10-08 DIAGNOSIS — E1122 Type 2 diabetes mellitus with diabetic chronic kidney disease: Secondary | ICD-10-CM | POA: Diagnosis present

## 2014-10-08 DIAGNOSIS — I10 Essential (primary) hypertension: Secondary | ICD-10-CM | POA: Diagnosis present

## 2014-10-08 DIAGNOSIS — G9340 Encephalopathy, unspecified: Secondary | ICD-10-CM

## 2014-10-08 DIAGNOSIS — I6529 Occlusion and stenosis of unspecified carotid artery: Secondary | ICD-10-CM | POA: Diagnosis present

## 2014-10-08 DIAGNOSIS — I4892 Unspecified atrial flutter: Secondary | ICD-10-CM | POA: Diagnosis present

## 2014-10-08 DIAGNOSIS — E1143 Type 2 diabetes mellitus with diabetic autonomic (poly)neuropathy: Secondary | ICD-10-CM | POA: Diagnosis present

## 2014-10-08 DIAGNOSIS — D649 Anemia, unspecified: Secondary | ICD-10-CM | POA: Diagnosis present

## 2014-10-08 DIAGNOSIS — Z515 Encounter for palliative care: Secondary | ICD-10-CM

## 2014-10-08 DIAGNOSIS — I4891 Unspecified atrial fibrillation: Secondary | ICD-10-CM | POA: Diagnosis present

## 2014-10-08 DIAGNOSIS — N189 Chronic kidney disease, unspecified: Secondary | ICD-10-CM | POA: Diagnosis present

## 2014-10-08 DIAGNOSIS — R197 Diarrhea, unspecified: Secondary | ICD-10-CM | POA: Diagnosis not present

## 2014-10-08 DIAGNOSIS — R1314 Dysphagia, pharyngoesophageal phase: Secondary | ICD-10-CM

## 2014-10-08 DIAGNOSIS — R4182 Altered mental status, unspecified: Secondary | ICD-10-CM

## 2014-10-08 DIAGNOSIS — Z7189 Other specified counseling: Secondary | ICD-10-CM

## 2014-10-08 DIAGNOSIS — I69391 Dysphagia following cerebral infarction: Secondary | ICD-10-CM

## 2014-10-08 DIAGNOSIS — Z9889 Other specified postprocedural states: Secondary | ICD-10-CM

## 2014-10-08 DIAGNOSIS — Z951 Presence of aortocoronary bypass graft: Secondary | ICD-10-CM

## 2014-10-08 DIAGNOSIS — R251 Tremor, unspecified: Secondary | ICD-10-CM | POA: Diagnosis present

## 2014-10-08 DIAGNOSIS — I619 Nontraumatic intracerebral hemorrhage, unspecified: Secondary | ICD-10-CM | POA: Diagnosis present

## 2014-10-08 DIAGNOSIS — R1310 Dysphagia, unspecified: Secondary | ICD-10-CM | POA: Diagnosis present

## 2014-10-08 DIAGNOSIS — I2510 Atherosclerotic heart disease of native coronary artery without angina pectoris: Secondary | ICD-10-CM | POA: Diagnosis present

## 2014-10-08 DIAGNOSIS — B961 Klebsiella pneumoniae [K. pneumoniae] as the cause of diseases classified elsewhere: Secondary | ICD-10-CM | POA: Diagnosis present

## 2014-10-08 DIAGNOSIS — E1142 Type 2 diabetes mellitus with diabetic polyneuropathy: Secondary | ICD-10-CM | POA: Diagnosis present

## 2014-10-08 DIAGNOSIS — K3184 Gastroparesis: Secondary | ICD-10-CM | POA: Diagnosis not present

## 2014-10-08 DIAGNOSIS — R4701 Aphasia: Secondary | ICD-10-CM | POA: Diagnosis present

## 2014-10-08 DIAGNOSIS — Z882 Allergy status to sulfonamides status: Secondary | ICD-10-CM

## 2014-10-08 DIAGNOSIS — Z96651 Presence of right artificial knee joint: Secondary | ICD-10-CM | POA: Diagnosis present

## 2014-10-08 DIAGNOSIS — I129 Hypertensive chronic kidney disease with stage 1 through stage 4 chronic kidney disease, or unspecified chronic kidney disease: Secondary | ICD-10-CM | POA: Diagnosis present

## 2014-10-08 DIAGNOSIS — Z79899 Other long term (current) drug therapy: Secondary | ICD-10-CM

## 2014-10-08 DIAGNOSIS — N179 Acute kidney failure, unspecified: Secondary | ICD-10-CM | POA: Diagnosis present

## 2014-10-08 LAB — URINALYSIS, ROUTINE W REFLEX MICROSCOPIC
Bilirubin Urine: NEGATIVE
Glucose, UA: NEGATIVE mg/dL
KETONES UR: NEGATIVE mg/dL
Nitrite: NEGATIVE
Protein, ur: >300 mg/dL — AB
Specific Gravity, Urine: 1.015 (ref 1.005–1.030)
Urobilinogen, UA: 0.2 mg/dL (ref 0.0–1.0)
pH: 6.5 (ref 5.0–8.0)

## 2014-10-08 LAB — CBC
HCT: 34.7 % — ABNORMAL LOW (ref 36.0–46.0)
Hemoglobin: 10.5 g/dL — ABNORMAL LOW (ref 12.0–15.0)
MCH: 29.3 pg (ref 26.0–34.0)
MCHC: 30.3 g/dL (ref 30.0–36.0)
MCV: 96.9 fL (ref 78.0–100.0)
Platelets: 263 10*3/uL (ref 150–400)
RBC: 3.58 MIL/uL — AB (ref 3.87–5.11)
RDW: 17.2 % — ABNORMAL HIGH (ref 11.5–15.5)
WBC: 8.5 10*3/uL (ref 4.0–10.5)

## 2014-10-08 LAB — PROTIME-INR
INR: 0.99 (ref 0.00–1.49)
Prothrombin Time: 13.2 seconds (ref 11.6–15.2)

## 2014-10-08 LAB — COMPREHENSIVE METABOLIC PANEL
ALBUMIN: 2.4 g/dL — AB (ref 3.5–5.2)
ALT: 56 U/L — AB (ref 0–35)
AST: 90 U/L — AB (ref 0–37)
Alkaline Phosphatase: 78 U/L (ref 39–117)
Anion gap: 14 (ref 5–15)
BUN: 56 mg/dL — ABNORMAL HIGH (ref 6–23)
CALCIUM: 8.6 mg/dL (ref 8.4–10.5)
CO2: 22 mEq/L (ref 19–32)
Chloride: 102 mEq/L (ref 96–112)
Creatinine, Ser: 1.4 mg/dL — ABNORMAL HIGH (ref 0.50–1.10)
GFR calc non Af Amer: 33 mL/min — ABNORMAL LOW (ref >90)
GFR, EST AFRICAN AMERICAN: 39 mL/min — AB (ref >90)
Glucose, Bld: 111 mg/dL — ABNORMAL HIGH (ref 70–99)
Potassium: >7.7 mEq/L (ref 3.7–5.3)
SODIUM: 138 meq/L (ref 137–147)
TOTAL PROTEIN: 7.1 g/dL (ref 6.0–8.3)
Total Bilirubin: 0.2 mg/dL — ABNORMAL LOW (ref 0.3–1.2)

## 2014-10-08 LAB — POTASSIUM: POTASSIUM: 4.9 meq/L (ref 3.7–5.3)

## 2014-10-08 LAB — URINE MICROSCOPIC-ADD ON

## 2014-10-08 MED ORDER — DEXTROSE 5 % IV SOLN
1.0000 g | Freq: Once | INTRAVENOUS | Status: AC
  Administered 2014-10-08: 1 g via INTRAVENOUS
  Filled 2014-10-08: qty 10

## 2014-10-08 MED ORDER — SODIUM CHLORIDE 0.9 % IV BOLUS (SEPSIS)
1000.0000 mL | Freq: Once | INTRAVENOUS | Status: AC
  Administered 2014-10-08: 1000 mL via INTRAVENOUS

## 2014-10-08 NOTE — ED Notes (Signed)
MD at bedside. 

## 2014-10-08 NOTE — ED Provider Notes (Signed)
CSN: [id number]     Arrival date & time 10/08/14  2010 History   First MD Initiated Contact with Patient 10/08/14 2019     Chief Complaint  Patient presents with  . Weakness  . Altered Mental Status     (Consider location/radiation/quality/duration/timing/severity/associated sxs/prior Treatment) HPI Patient is a 80 y.o. Female with a recent history of a hemorrhagic stroke who presents with altered mental status. She was recently discharged after suffering hemorrhagic stroke and is currently residing in a rehabilitation facility. The patient's daughter and granddaughter service historians. They report that over the past 3 days the patient has had a gradually worsening mental status. They say that on Saturday they were able to carry on a normal conversation with her, but over the past 3 days she has become less interactive, and is now unable to follow commands, carry on a conversation, or answer questions. She has no reported fevers at the facility. She is unable to provide any history herself, as she is currently nonverbal.   Past Medical History  Diagnosis Date  . Diabetes mellitus with diabetic polyneuropathy   . A-fib   . Renal disorder   . Hypertension   . Anemia   . Glaucoma     right eye   . Carotid artery stenosis   . PVD (peripheral vascular disease)    Past Surgical History  Procedure Laterality Date  . Replacement total knee Right   . Carotid endarterectomy  2015  . Coronary artery bypass graft    . Ablation      cardiac for arrthymia    No family history on file. History  Substance Use Topics  . Smoking status: Never Smoker   . Smokeless tobacco: Not on file  . Alcohol Use: No   OB History    No data available     Review of Systems  Unable to perform ROS: Mental status change      Allergies  Septra  Home Medications   Prior to Admission medications   Medication Sig Start Date End Date Taking? Authorizing Provider  acetaminophen (TYLENOL) 500 MG  tablet Take 1,000 mg by mouth 3 (three) times daily as needed (pain).    Historical Provider, MD  allopurinol (ZYLOPRIM) 100 MG tablet Take 100 mg by mouth 2 (two) times daily.     Historical Provider, MD  amiodarone (PACERONE) 200 MG tablet Place 1 tablet (200 mg total) into feeding tube daily. 09/26/14   Layne BentonSharon L Biby, NP  atorvastatin (LIPITOR) 20 MG tablet Take 1 tablet (20 mg total) by mouth daily at 6 PM. 09/30/14   Layne BentonSharon L Biby, NP  cholecalciferol (VITAMIN D) 1000 UNITS tablet Take 1,000 Units by mouth every other day.    Historical Provider, MD  ciprofloxacin (CIPRO) 250 MG tablet Take 1 tablet (250 mg total) by mouth 2 (two) times daily. 09/30/14   Marvel PlanJindong Xu, MD  cloNIDine (CATAPRES) 0.1 MG tablet Place 1 tablet (0.1 mg total) into feeding tube 2 (two) times daily. 09/30/14   Layne BentonSharon L Biby, NP  Cyanocobalamin (VITAMIN B-12 PO) Take 1 tablet by mouth every other day.    Historical Provider, MD  diltiazem (CARDIZEM) 30 MG tablet Take 1 tablet (30 mg total) by mouth every 6 (six) hours. 09/26/14   Layne BentonSharon L Biby, NP  feeding supplement, ENSURE COMPLETE, (ENSURE COMPLETE) LIQD Take 237 mLs by mouth 3 (three) times daily with meals. 09/30/14   Marvel PlanJindong Xu, MD  ferrous sulfate 325 (65 FE) MG tablet Take 325  mg by mouth daily with breakfast.    Historical Provider, MD  furosemide (LASIX) 20 MG tablet Take 20 mg by mouth 2 (two) times daily.     Historical Provider, MD  metFORMIN (GLUCOPHAGE) 500 MG tablet Take 500 mg by mouth daily with breakfast.     Historical Provider, MD  metoprolol tartrate (LOPRESSOR) 25 mg/10 mL SUSP Place 40 mLs (100 mg total) into feeding tube 2 (two) times daily. 09/30/14   Layne BentonSharon L Biby, NP  Nutritional Supplements (FEEDING SUPPLEMENT, VITAL AF 1.2 CAL,) LIQD Place 270 mLs into feeding tube 4 (four) times daily - after meals and at bedtime. 09/26/14   Layne BentonSharon L Biby, NP  pantoprazole sodium (PROTONIX) 40 mg/20 mL PACK Place 20 mLs (40 mg total) into feeding tube daily.  09/26/14   Layne BentonSharon L Biby, NP  potassium chloride 20 MEQ/15ML (10%) SOLN Place 15 mLs (20 mEq total) into feeding tube daily. 09/30/14   Layne BentonSharon L Biby, NP  QUEtiapine (SEROQUEL) 12.5 mg TABS tablet Take 0.5 tablets (12.5 mg total) by mouth 3 (three) times daily. 09/30/14   Layne BentonSharon L Biby, NP  Travoprost, BAK Free, (TRAVATAN) 0.004 % SOLN ophthalmic solution Place 1 drop into the right eye at bedtime.    Historical Provider, MD  Water For Irrigation, Sterile (FREE WATER) SOLN Place 300 mLs into feeding tube every 4 (four) hours. 09/30/14   Layne BentonSharon L Biby, NP   BP 130/48 mmHg  Pulse 91  Resp 19  Ht 4\' 11"  (1.499 m)  Wt 140 lb (63.504 kg)  BMI 28.26 kg/m2  SpO2 100% Physical Exam  Constitutional: No distress.  HENT:  Head: Normocephalic.  Small surgical scars on the right parietal scalp appear intact without evidence of infection.  Eyes: EOM are normal. Pupils are equal, round, and reactive to light.  Neck: Normal range of motion.  Cardiovascular: Normal rate and regular rhythm.   Pulmonary/Chest: Effort normal and breath sounds normal. No respiratory distress.  Abdominal: Soft. She exhibits no distension. There is no tenderness.  Neurological: She is alert. GCS eye subscore is 4. GCS verbal subscore is 3. GCS motor subscore is 5.  Skin: Skin is warm.  Nursing note and vitals reviewed.   ED Course  Procedures (including critical care time) Labs Review Labs Reviewed  COMPREHENSIVE METABOLIC PANEL  CBC  URINALYSIS, ROUTINE W REFLEX MICROSCOPIC    Imaging Review No results found.   EKG Interpretation None      MDM   Final diagnoses:  None   80 year old female with a recent history of auscultation for urinary tract infection and hemorrhagic stroke presents with altered mental status. Repeat CT of her head shows resolving hydrocephalus and no new bleeds. CBC with no leukocytosis, no anemia. CMP appears to be hemolyzed, potassium was rechecked however and was found within normal  limits. There is however no evidence of hyper-or hyponatremia. UA obtained from catheterized specimen shows too numerous to count white blood cells and bacteria. We will treat with Rocephin. We will admit for treatment of her UTI and encephalopathy.   Erskine Emeryhris Rojelio Uhrich, MD 10/08/14 2344  Nelia Shiobert L Beaton, MD 10/16/14 [phone]

## 2014-10-08 NOTE — ED Notes (Signed)
[facility]. Grandaughter called EMS after stating that pt has had increasing weakness and lethargy since Sunday. States that pt had UTI recently which showed similar symptoms. States that pt had a stroke 1 month ago. Hx afib, DM, open heart. HR 72 BP 114/70 CBG 114

## 2014-10-08 NOTE — ED Notes (Addendum)
Family at bedside. States that pt has been at rehab and has been communicative, able to feed self and has had some short term memory. States that since Sunday pt has been unable to do any of this.

## 2014-10-08 NOTE — Telephone Encounter (Signed)
Susan Calhoun at the nursing rehab is calling wanting to have the patient seen sooner due to her declining health.  She arrive at the nursing home from the hospital on 11/10 and the patient seems to be getting worse.  Please call and advise.  Her appointment with Dr. Pearlean BrownieSethi is not until 2/4.

## 2014-10-09 ENCOUNTER — Inpatient Hospital Stay (HOSPITAL_COMMUNITY): Payer: Medicare (Managed Care)

## 2014-10-09 ENCOUNTER — Encounter (HOSPITAL_COMMUNITY): Payer: Self-pay | Admitting: *Deleted

## 2014-10-09 DIAGNOSIS — I129 Hypertensive chronic kidney disease with stage 1 through stage 4 chronic kidney disease, or unspecified chronic kidney disease: Secondary | ICD-10-CM | POA: Diagnosis present

## 2014-10-09 DIAGNOSIS — I6529 Occlusion and stenosis of unspecified carotid artery: Secondary | ICD-10-CM | POA: Diagnosis present

## 2014-10-09 DIAGNOSIS — B961 Klebsiella pneumoniae [K. pneumoniae] as the cause of diseases classified elsewhere: Secondary | ICD-10-CM | POA: Diagnosis present

## 2014-10-09 DIAGNOSIS — I4892 Unspecified atrial flutter: Secondary | ICD-10-CM | POA: Diagnosis present

## 2014-10-09 DIAGNOSIS — R197 Diarrhea, unspecified: Secondary | ICD-10-CM | POA: Diagnosis not present

## 2014-10-09 DIAGNOSIS — I69391 Dysphagia following cerebral infarction: Secondary | ICD-10-CM | POA: Diagnosis not present

## 2014-10-09 DIAGNOSIS — E1122 Type 2 diabetes mellitus with diabetic chronic kidney disease: Secondary | ICD-10-CM | POA: Diagnosis present

## 2014-10-09 DIAGNOSIS — G9340 Encephalopathy, unspecified: Principal | ICD-10-CM

## 2014-10-09 DIAGNOSIS — Z9889 Other specified postprocedural states: Secondary | ICD-10-CM | POA: Diagnosis not present

## 2014-10-09 DIAGNOSIS — I2510 Atherosclerotic heart disease of native coronary artery without angina pectoris: Secondary | ICD-10-CM | POA: Diagnosis present

## 2014-10-09 DIAGNOSIS — I4891 Unspecified atrial fibrillation: Secondary | ICD-10-CM | POA: Diagnosis present

## 2014-10-09 DIAGNOSIS — R4701 Aphasia: Secondary | ICD-10-CM | POA: Diagnosis present

## 2014-10-09 DIAGNOSIS — R251 Tremor, unspecified: Secondary | ICD-10-CM

## 2014-10-09 DIAGNOSIS — E1142 Type 2 diabetes mellitus with diabetic polyneuropathy: Secondary | ICD-10-CM

## 2014-10-09 DIAGNOSIS — E1143 Type 2 diabetes mellitus with diabetic autonomic (poly)neuropathy: Secondary | ICD-10-CM | POA: Diagnosis present

## 2014-10-09 DIAGNOSIS — Z96651 Presence of right artificial knee joint: Secondary | ICD-10-CM | POA: Diagnosis present

## 2014-10-09 DIAGNOSIS — N189 Chronic kidney disease, unspecified: Secondary | ICD-10-CM | POA: Diagnosis present

## 2014-10-09 DIAGNOSIS — R1310 Dysphagia, unspecified: Secondary | ICD-10-CM | POA: Diagnosis present

## 2014-10-09 DIAGNOSIS — N179 Acute kidney failure, unspecified: Secondary | ICD-10-CM | POA: Diagnosis present

## 2014-10-09 DIAGNOSIS — I10 Essential (primary) hypertension: Secondary | ICD-10-CM

## 2014-10-09 DIAGNOSIS — Z951 Presence of aortocoronary bypass graft: Secondary | ICD-10-CM | POA: Diagnosis not present

## 2014-10-09 DIAGNOSIS — N390 Urinary tract infection, site not specified: Secondary | ICD-10-CM | POA: Diagnosis present

## 2014-10-09 DIAGNOSIS — Z66 Do not resuscitate: Secondary | ICD-10-CM | POA: Diagnosis present

## 2014-10-09 DIAGNOSIS — Z882 Allergy status to sulfonamides status: Secondary | ICD-10-CM | POA: Diagnosis not present

## 2014-10-09 DIAGNOSIS — K3184 Gastroparesis: Secondary | ICD-10-CM | POA: Diagnosis not present

## 2014-10-09 DIAGNOSIS — H409 Unspecified glaucoma: Secondary | ICD-10-CM | POA: Diagnosis present

## 2014-10-09 DIAGNOSIS — R4182 Altered mental status, unspecified: Secondary | ICD-10-CM | POA: Diagnosis present

## 2014-10-09 DIAGNOSIS — D649 Anemia, unspecified: Secondary | ICD-10-CM | POA: Diagnosis present

## 2014-10-09 DIAGNOSIS — E1151 Type 2 diabetes mellitus with diabetic peripheral angiopathy without gangrene: Secondary | ICD-10-CM | POA: Diagnosis present

## 2014-10-09 DIAGNOSIS — Z79899 Other long term (current) drug therapy: Secondary | ICD-10-CM | POA: Diagnosis not present

## 2014-10-09 LAB — COMPREHENSIVE METABOLIC PANEL
ALT: 50 U/L — AB (ref 0–35)
AST: 35 U/L (ref 0–37)
Albumin: 2.3 g/dL — ABNORMAL LOW (ref 3.5–5.2)
Alkaline Phosphatase: 85 U/L (ref 39–117)
Anion gap: 15 (ref 5–15)
BUN: 50 mg/dL — ABNORMAL HIGH (ref 6–23)
CO2: 22 mEq/L (ref 19–32)
CREATININE: 1.5 mg/dL — AB (ref 0.50–1.10)
Calcium: 8.5 mg/dL (ref 8.4–10.5)
Chloride: 107 mEq/L (ref 96–112)
GFR calc Af Amer: 36 mL/min — ABNORMAL LOW (ref >90)
GFR calc non Af Amer: 31 mL/min — ABNORMAL LOW (ref >90)
Glucose, Bld: 177 mg/dL — ABNORMAL HIGH (ref 70–99)
Potassium: 4.4 mEq/L (ref 3.7–5.3)
Sodium: 144 mEq/L (ref 137–147)
Total Bilirubin: <0.2 mg/dL — ABNORMAL LOW (ref 0.3–1.2)
Total Protein: 6.6 g/dL (ref 6.0–8.3)

## 2014-10-09 LAB — CBC WITH DIFFERENTIAL/PLATELET
BASOS PCT: 1 % (ref 0–1)
Basophils Absolute: 0.1 10*3/uL (ref 0.0–0.1)
EOS ABS: 0.2 10*3/uL (ref 0.0–0.7)
Eosinophils Relative: 2 % (ref 0–5)
HEMATOCRIT: 34.8 % — AB (ref 36.0–46.0)
HEMOGLOBIN: 10.5 g/dL — AB (ref 12.0–15.0)
Lymphocytes Relative: 23 % (ref 12–46)
Lymphs Abs: 1.7 10*3/uL (ref 0.7–4.0)
MCH: 29.2 pg (ref 26.0–34.0)
MCHC: 30.2 g/dL (ref 30.0–36.0)
MCV: 96.7 fL (ref 78.0–100.0)
MONO ABS: 0.8 10*3/uL (ref 0.1–1.0)
Monocytes Relative: 10 % (ref 3–12)
NEUTROS ABS: 4.8 10*3/uL (ref 1.7–7.7)
Neutrophils Relative %: 64 % (ref 43–77)
Platelets: 260 10*3/uL (ref 150–400)
RBC: 3.6 MIL/uL — ABNORMAL LOW (ref 3.87–5.11)
RDW: 16.4 % — ABNORMAL HIGH (ref 11.5–15.5)
WBC: 7.5 10*3/uL (ref 4.0–10.5)

## 2014-10-09 LAB — PROLACTIN: Prolactin: 11.5 ng/mL

## 2014-10-09 LAB — PROTIME-INR
INR: 0.99 (ref 0.00–1.49)
Prothrombin Time: 13.2 seconds (ref 11.6–15.2)

## 2014-10-09 LAB — GLUCOSE, CAPILLARY
Glucose-Capillary: 97 mg/dL (ref 70–99)
Glucose-Capillary: 157 mg/dL — ABNORMAL HIGH (ref 70–99)
Glucose-Capillary: 89 mg/dL (ref 70–99)

## 2014-10-09 LAB — TSH: TSH: 2.06 u[IU]/mL (ref 0.350–4.500)

## 2014-10-09 LAB — MRSA PCR SCREENING: MRSA BY PCR: NEGATIVE

## 2014-10-09 LAB — LACTIC ACID, PLASMA: LACTIC ACID, VENOUS: 1.7 mmol/L (ref 0.5–2.2)

## 2014-10-09 MED ORDER — ONDANSETRON HCL 4 MG/2ML IJ SOLN: 4.0000 mg | Freq: Four times a day (QID) | INTRAMUSCULAR | Status: DC | PRN

## 2014-10-09 MED ORDER — LORAZEPAM 2 MG/ML IJ SOLN
1.0000 mg | Freq: Once | INTRAMUSCULAR | Status: AC
  Administered 2014-10-09: 1 mg via INTRAVENOUS
  Filled 2014-10-09: qty 1

## 2014-10-09 MED ORDER — AMIODARONE HCL 200 MG PO TABS
200.0000 mg | ORAL_TABLET | Freq: Every day | ORAL | Status: DC
  Administered 2014-10-09 – 2014-10-21 (×12): 200 mg
  Filled 2014-10-09 (×13): qty 1

## 2014-10-09 MED ORDER — ALLOPURINOL 100 MG PO TABS
100.0000 mg | ORAL_TABLET | Freq: Two times a day (BID) | ORAL | Status: DC
  Administered 2014-10-09 – 2014-10-21 (×23): 100 mg
  Filled 2014-10-09 (×25): qty 1

## 2014-10-09 MED ORDER — ONDANSETRON HCL 4 MG PO TABS: 4.0000 mg | ORAL_TABLET | Freq: Four times a day (QID) | ORAL | Status: DC | PRN

## 2014-10-09 MED ORDER — METOPROLOL TARTRATE 25 MG/10 ML ORAL SUSPENSION
100.0000 mg | Freq: Two times a day (BID) | ORAL | Status: DC
  Administered 2014-10-09 – 2014-10-17 (×16): 100 mg
  Administered 2014-10-17: 25 mg
  Administered 2014-10-17 – 2014-10-21 (×6): 100 mg
  Filled 2014-10-09 (×28): qty 40

## 2014-10-09 MED ORDER — FREE WATER
300.0000 mL | Status: DC
  Administered 2014-10-09 – 2014-10-21 (×73): 300 mL

## 2014-10-09 MED ORDER — INSULIN ASPART 100 UNIT/ML ~~LOC~~ SOLN
0.0000 [IU] | Freq: Four times a day (QID) | SUBCUTANEOUS | Status: DC
  Administered 2014-10-09: 3 [IU] via SUBCUTANEOUS
  Administered 2014-10-10 – 2014-10-11 (×3): 2 [IU] via SUBCUTANEOUS
  Administered 2014-10-11 – 2014-10-12 (×3): 3 [IU] via SUBCUTANEOUS
  Administered 2014-10-12: 2 [IU] via SUBCUTANEOUS
  Administered 2014-10-12 – 2014-10-13 (×3): 3 [IU] via SUBCUTANEOUS

## 2014-10-09 MED ORDER — DEXTROSE 5 % IV SOLN
1.0000 g | INTRAVENOUS | Status: DC
  Administered 2014-10-09 – 2014-10-11 (×3): 1 g via INTRAVENOUS
  Filled 2014-10-09 (×4): qty 1

## 2014-10-09 MED ORDER — ACETAMINOPHEN 650 MG RE SUPP: 650.0000 mg | Freq: Four times a day (QID) | RECTAL | Status: DC | PRN

## 2014-10-09 MED ORDER — ATORVASTATIN CALCIUM 80 MG PO TABS
80.0000 mg | ORAL_TABLET | Freq: Every day | ORAL | Status: DC
  Administered 2014-10-09 – 2014-10-21 (×13): 80 mg
  Filled 2014-10-09 (×13): qty 1

## 2014-10-09 MED ORDER — ACETAMINOPHEN 325 MG PO TABS: 650.0000 mg | ORAL_TABLET | Freq: Four times a day (QID) | ORAL | Status: DC | PRN

## 2014-10-09 MED ORDER — PANTOPRAZOLE SODIUM 40 MG PO PACK
40.0000 mg | PACK | Freq: Every day | ORAL | Status: DC
  Administered 2014-10-09 – 2014-10-21 (×12): 40 mg
  Filled 2014-10-09 (×13): qty 20

## 2014-10-09 MED ORDER — DILTIAZEM HCL 30 MG PO TABS
30.0000 mg | ORAL_TABLET | Freq: Four times a day (QID) | ORAL | Status: DC
  Administered 2014-10-09 – 2014-10-21 (×48): 30 mg via ORAL
  Filled 2014-10-09 (×54): qty 1

## 2014-10-09 MED ORDER — SODIUM CHLORIDE 0.9 % IJ SOLN
3.0000 mL | Freq: Two times a day (BID) | INTRAMUSCULAR | Status: DC
  Administered 2014-10-09 – 2014-10-21 (×24): 3 mL via INTRAVENOUS

## 2014-10-09 MED ORDER — ATORVASTATIN CALCIUM 80 MG PO TABS
80.0000 mg | ORAL_TABLET | Freq: Every day | ORAL | Status: DC
  Filled 2014-10-09: qty 1

## 2014-10-09 MED ORDER — ALLOPURINOL 100 MG PO TABS
100.0000 mg | ORAL_TABLET | Freq: Two times a day (BID) | ORAL | Status: DC
  Administered 2014-10-09: 100 mg via ORAL
  Filled 2014-10-09 (×2): qty 1

## 2014-10-09 NOTE — Consult Note (Signed)
Consult Reason for Consult:altered mental status Referring Physician: Dr Allena Katz  CC: altered mental status  HPI: Susan Calhoun is an 80 y.o. female with pmhx of recent IVH s/p drain, A fib/flutter, HTN presenting for progressively worsening encephalopathy. Brought in by her daughter from the NH with 3 to 4 days of worsening confusion. Per daughter the patient was initially doing well since hospital discharge (on 09/30/14). Was communicating, taking minimal oral intake. Started to decline around 4 days ago, in past 24hrs she has not been eating or communicating. No noted fevers at [facility]. Daughter notes brief 1-2 second episodes of whole body shivering/tremors in the past 1-2 days.   Found to have a UTI in the ED. Started on cefepime.   Patient had a complicated recent admission, admitted on 09/01/2014 and discharged on 09/30/2014. Admitted with IVH suspected to be secondary to coumadin associated coagulopathy in setting of uncontrolled HTN. She had associated hydrocephalus and a drain was temporarily placed. Noted to have profound fluctuating encephalopathy which was though to be secondary to IVH and hydrocephalus. She had multiple EEGs including LTM done showing general slowing and frequent intermittent triphasic waves. She did have generalized tremors during hospital stay with no EEG correlate. Of note her coumadin was discontinued on discharge.   Past Medical History  Diagnosis Date  . Diabetes mellitus with diabetic polyneuropathy   . A-fib   . Renal disorder   . Hypertension   . Anemia   . Glaucoma     right eye   . Carotid artery stenosis   . PVD (peripheral vascular disease)     Past Surgical History  Procedure Laterality Date  . Replacement total knee Right   . Carotid endarterectomy  2015  . Coronary artery bypass graft    . Ablation      cardiac for arrthymia     History reviewed. No pertinent family history.  Social History:  reports that she has never smoked. She does  not have any smokeless tobacco history on file. She reports that she does not drink alcohol or use illicit drugs.  Allergies  Allergen Reactions  . Septra [Sulfamethoxazole-Trimethoprim] Hives    Medications:  Scheduled: . allopurinol  100 mg Oral BID  . amiodarone  200 mg Per Tube Daily  . atorvastatin  80 mg Oral q1800  . ceFEPime (MAXIPIME) IV  1 g Intravenous Q24H  . diltiazem  30 mg Oral 4 times per day  . free water  300 mL Per Tube Q4H  . insulin aspart  0-15 Units Subcutaneous 4 times per day  . metoprolol tartrate  100 mg Per Tube BID  . pantoprazole sodium  40 mg Per Tube Daily  . sodium chloride  3 mL Intravenous Q12H    Head CT reviewed and overall stable, apparent resolution of IVH from prior admission.   ROS: Out of a complete 14 system review, the patient complains of only the following symptoms, and all other reviewed systems are negative.  Physical Examination: Blood pressure 175/67, pulse 108, temperature 98.5 F (36.9 C), temperature source Axillary, resp. rate 21, height 4\' 11"  (1.499 m), weight 62.732 kg (138 lb 4.8 oz), SpO2 100 %.  Neurologic Examination Mental Status: Eyes closed, will open to voice or noxious stimuli. No spontaneous verbal output, will moan or say "ow" to noxious stimuli. Localizes to noxious stimuli. Not following commands.  Cranial Nerves: II: unable to visualize fundi due to mental status, PERRL III,IV, VI: ptosis not present,eyes midline, no gaze preference, does  not make eye contact or track examiner V,VII: no facial asymmetry noted IX,X: gag reflex present Motor: Due to mental status unable to formally assess. Minimal spontaneous movement but appears to move all extremities symmetrically. No tremor or extremity twitching noted.  Tone and bulk:normal tone throughout; no atrophy noted Sensory: withdrawals symmetrically to noxious stimuli in all extremities Deep Tendon Reflexes: 2+ and symmetric throughout Plantars: Right:  downgoing   Left: downgoing Cerebellar: Unable to test due to mental status Gait: unable to test due to mental status  Laboratory Studies:   Basic Metabolic Panel:  Recent Labs Lab 10/08/14 2021 10/08/14 2107  NA 138  --   K >7.7* 4.9  CL 102  --   CO2 22  --   GLUCOSE 111*  --   BUN 56*  --   CREATININE 1.40*  --   CALCIUM 8.6  --     Liver Function Tests:  Recent Labs Lab 10/08/14 2021  AST 90*  ALT 56*  ALKPHOS 78  BILITOT 0.2*  PROT 7.1  ALBUMIN 2.4*   No results for input(s): LIPASE, AMYLASE in the last 168 hours. No results for input(s): AMMONIA in the last 168 hours.  CBC:  Recent Labs Lab 10/08/14 2021  WBC 8.5  HGB 10.5*  HCT 34.7*  MCV 96.9  PLT 263    Cardiac Enzymes: No results for input(s): CKTOTAL, CKMB, CKMBINDEX, TROPONINI in the last 168 hours.  BNP: Invalid input(s): POCBNP  CBG:  Recent Labs Lab 10/09/14 0121  GLUCAP 97    Microbiology: Results for orders placed or performed during the hospital encounter of 09/01/14  Culture, Urine     Status: None   Collection Time: 09/01/14  8:52 PM  Result Value Ref Range Status   Specimen Description URINE, RANDOM  Final   Special Requests NONE  Final   Culture  Setup Time   Final    09/02/2014 05:33 Performed at [facility]   Colony Count NO GROWTH Performed at [facility]  Final   Culture NO GROWTH Performed at [facility]  Final   Report Status 09/03/2014 FINAL  Final  Culture, blood (routine x 2)     Status: None   Collection Time: 09/01/14  9:18 PM  Result Value Ref Range Status   Specimen Description BLOOD RIGHT ARM  Final   Special Requests BOTTLES DRAWN AEROBIC AND ANAEROBIC 4CC  Final   Culture  Setup Time   Final    09/02/2014 04:13 Performed at [facility]   Culture   Final    NO GROWTH 5 DAYS Performed at [facility]   Report Status 09/08/2014 FINAL  Final  Culture, blood (routine x 2)     Status: None   Collection  Time: 09/01/14  9:45 PM  Result Value Ref Range Status   Specimen Description BLOOD RIGHT HAND  Final   Special Requests BOTTLES DRAWN AEROBIC ONLY 8CC  Final   Culture  Setup Time   Final    09/02/2014 04:12 Performed at [facility]   Culture   Final    NO GROWTH 5 DAYS Performed at [facility]   Report Status 09/08/2014 FINAL  Final  MRSA PCR Screening     Status: None   Collection Time: 09/03/14  4:41 PM  Result Value Ref Range Status   MRSA by PCR NEGATIVE NEGATIVE Final    Comment:        The GeneXpert MRSA Assay (FDA  approved for NASAL specimens only), is one component of a comprehensive MRSA colonization surveillance program. It is not intended to diagnose MRSA infection nor to guide or monitor treatment for MRSA infections.  CSF culture     Status: None   Collection Time: 09/03/14  5:40 PM  Result Value Ref Range Status   Specimen Description CSF  Final   Special Requests NONE  Final   Gram Stain   Final    RARE WBC PRESENT,BOTH PMN AND MONONUCLEAR NO ORGANISMS SEEN Performed at [facility] Performed at [facility]   Culture   Final    NO GROWTH 3 DAYS Performed at [facility]   Report Status 09/07/2014 FINAL  Final  Gram stain     Status: None   Collection Time: 09/03/14  5:40 PM  Result Value Ref Range Status   Specimen Description CSF  Final   Special Requests NONE  Final   Gram Stain   Final    RARE WBC PRESENT,BOTH PMN AND MONONUCLEAR NO ORGANISMS SEEN   Report Status 09/04/2014 FINAL  Final  Clostridium Difficile by PCR     Status: None   Collection Time: 09/06/14 12:00 PM  Result Value Ref Range Status   C difficile by pcr NEGATIVE NEGATIVE Final  CSF culture     Status: None   Collection Time: 09/11/14 10:08 AM  Result Value Ref Range Status   Specimen Description CSF  Final   Special Requests NO 3 CUP  Final   Gram Stain   Final    WBC PRESENT,BOTH PMN AND MONONUCLEAR NO ORGANISMS SEEN CYTOSPIN  Performed at [facility] Performed at [facility]   Culture   Final    NO GROWTH 3 DAYS Performed at [facility]   Report Status 09/14/2014 FINAL  Final  Gram stain - STAT with CSF culture     Status: None   Collection Time: 09/11/14 10:08 AM  Result Value Ref Range Status   Specimen Description CSF  Final   Special Requests NO 3 CUP  Final   Gram Stain   Final    cytospin slide WBC PRESENT,BOTH PMN AND MONONUCLEAR NO ORGANISMS SEEN   Report Status 09/11/2014 FINAL  Final  Culture, Urine     Status: None   Collection Time: 09/12/14 10:59 PM  Result Value Ref Range Status   Specimen Description URINE, CATHETERIZED  Final   Special Requests NONE  Final   Culture  Setup Time   Final    09/13/2014 05:17 Performed at [facility]   Colony Count   Final    >=100,000 COLONIES/ML Performed at [facility]   Culture YEAST Performed at [facility]  Final   Report Status 09/15/2014 FINAL  Final  Culture, blood (routine x 2)     Status: None   Collection Time: 09/12/14 11:50 PM  Result Value Ref Range Status   Specimen Description BLOOD RIGHT ARM  Final   Special Requests BOTTLES DRAWN AEROBIC ONLY 10CC  Final   Culture  Setup Time   Final    09/13/2014 03:47 Performed at [facility]   Culture   Final    NO GROWTH 5 DAYS Performed at [facility]   Report Status 09/19/2014 FINAL  Final  Culture, blood (routine x 2)     Status: None   Collection Time: 09/12/14 11:59 PM  Result Value Ref Range Status   Specimen Description BLOOD RIGHT HAND  Final   Special Requests BOTTLES DRAWN AEROBIC ONLY 5CC  Final   Culture  Setup Time   Final    09/13/2014 03:47 Performed at [facility]   Culture   Final    NO GROWTH 5 DAYS Performed at [facility]   Report Status 09/19/2014 FINAL  Final  Clostridium Difficile by PCR     Status: None   Collection Time: 09/17/14  2:42 PM  Result Value Ref Range Status    C difficile by pcr NEGATIVE NEGATIVE Final  Clostridium Difficile by PCR     Status: None   Collection Time: 09/22/14  5:30 PM  Result Value Ref Range Status   C difficile by pcr NEGATIVE NEGATIVE Final    Coagulation Studies:  Recent Labs  10/08/14 2107  LABPROT 13.2  INR 0.99    Urinalysis:  Recent Labs Lab 10/08/14 2056  COLORURINE YELLOW  LABSPEC 1.015  PHURINE 6.5  GLUCOSEU NEGATIVE  HGBUR SMALL*  BILIRUBINUR NEGATIVE  KETONESUR NEGATIVE  PROTEINUR >300*  UROBILINOGEN 0.2  NITRITE NEGATIVE  LEUKOCYTESUR MODERATE*    Lipid Panel:     Component Value Date/Time   CHOL 204* 09/02/2014 0530   TRIG 149 09/02/2014 0530   HDL 64 09/02/2014 0530   CHOLHDL 3.2 09/02/2014 0530   VLDL 30 09/02/2014 0530   LDLCALC 110* 09/02/2014 0530    HgbA1C:  Lab Results  Component Value Date   HGBA1C 5.6 09/01/2014    Urine Drug Screen:  No results found for: LABOPIA, COCAINSCRNUR, LABBENZ, AMPHETMU, THCU, LABBARB  Alcohol Level: No results for input(s): ETH in the last 168 hours.  Other results: EKG: atrial flutter.  Imaging: Ct Head Wo Contrast  10/08/2014   CLINICAL DATA:  Increasing weakness and lethargy since Saturday, recent UTIs, personal history of diabetes, renal disorder, hypertension  EXAM: CT HEAD WITHOUT CONTRAST  TECHNIQUE: Contiguous axial images were obtained from the base of the skull through the vertex without intravenous contrast.  COMPARISON:  09/26/2014  FINDINGS: Minimal atrophy.  Mild prominence of the ventricular system little changed.  Small vessel chronic ischemic changes of deep cerebral white matter.  Intraventricular blood identified at the occipital horns of the lateral ventricles on the previous exam no longer identified.  Focus of high attenuation at the RIGHT parietal lobe likely tiny calcification, unchanged since 09/04/2014 making this unlikely to represent blood.  No definite high attenuation acute intracranial hemorrhage, mass lesion or  evidence acute infarction.  No extra-axial fluid collections.  Atherosclerotic calcifications at the carotid siphons.  Scattered motion artifacts.  Prior burr hole RIGHT frontal.  Hyperostosis frontalis interna.  No acute osseous findings.  IMPRESSION: Motion artifacts.  Atrophy with mild persistent prominence of the ventricular system and small vessel chronic ischemic changes.  Resolution of minimal intraventricular blood seen on the previous exam.   Electronically Signed   By: Ulyses SouthwardMark  Boles M.D.   On: 10/08/2014 22:13   Dg Chest Port 1 View  10/08/2014   CLINICAL DATA:  Initial evaluation for weakness and lethargy for 3 days with altered mental status  EXAM: PORTABLE CHEST - 1 VIEW  COMPARISON:  09/30/2014  FINDINGS: The heart size and mediastinal contours are within normal limits. Both lungs are clear. The visualized skeletal structures are unremarkable.  IMPRESSION: No active disease.   Electronically Signed   By: Esperanza Heiraymond  Rubner M.D.   On: 10/08/2014 21:32     Assessment/Plan: 80y/o woman with history of IVH with hydrocephalus, A fib/flutter off coumadin presenting  for progressively worsening encephalopathy over the past 3-4 days. With history of A fib off coumadin would have concern for CVA as etiology of decline. Seizure, specifically non-convulsive status would be the other main concern. Underlying UTI may have lowered her seizure threshold. UTI in an elderly patient can cause encephalopathy but this appears more severe than would be expected.   -EEG. Talked with tech, patient will be hooked up this morning for routine EEG -MRI brain -continue cefepime -if above workup unremarkable would consider LP -will follow    Elspeth Cho, DO [facility] [phone]  If 7pm- 7am, please page neurology on call as listed in AMION. 10/09/2014, 2:46 AM

## 2014-10-09 NOTE — Progress Notes (Signed)
Subjective: More awake and interactive with family, but still not talking.   Exam: Filed Vitals:   10/09/14 1000  BP: 152/75  Pulse: 101  Temp: 98.3 F (36.8 C)  Resp: 18   Gen: In bed, NAD MS: Awake, regards examiner, does not follow commands ON:GEXBMCN:PERRL, EOMI Motor: Moves all extremities  Sensory:responds to nox stim x 4.    Impression: 80 yo F with altered mental status EEG shows frequent triphasic waves which is typically an encephalopathic pattern, but in rare cases has been suspected to represent seizure. Though very unlikely, if the patient were to markedly improve following ativan, then this may need to be considered. If no improvement, then I would favor continued treatment of UTI with suspicion of metabolic encephalopathy secondary to UTI.   Recommendations: 1) ativan 1mg  IV x 1.  2) will continue to follow.   Ritta SlotMcNeill Lukka Black, MD [facility] [phone]  If 7pm- 7am, please page neurology on call as listed in AMION.

## 2014-10-09 NOTE — H&P (Signed)
[facility] History and Physical  Patient: Susan Calhoun  WUJ:[id number]  DOB: 11-24-1927  DOS: the patient was seen and examined on 10/09/2014 PCP: Pcp Not In System  Chief Complaint: confusion  HPI: Susan Calhoun is a 80 y.o. female with Past medical history of diabetes mellitus, chronic kidney disease, hypertension, peripheral vascular disease, recent CVA with intraventricular hemorrhage status post drain placement and removal. The patient is presenting with complaint of confusion. She was brought in by her daughter from the nursing home. As per the daughter since last 3-4 days the patient has been having progressively worsening mental status. She was recently admitted for hemorrhagic stroke and the after the discharge as per the daughter the patient has been communicating well has been working with physical therapy well without any complication and she has been able to tolerate oral feeding as well as tube feeding. Since last 3-4 days the patient has not been able to eat orally due to lack of appetite and has not been communicating well. Since last 24-48 hours the patient has not been talking at all and since this morning on 10/08/2014 the patient has not been responsive to any verbal command or stimuli. Patient has not received her medication other than Depakote on 10/08/2014. No fall no trauma no injury reported. No fever no chills no chest pain reported. No cough reported. Daughter mentions that patient has been having episodes of tremor-like sensations involving her whole body lasting for a few seconds. No incontinence of bowel or bladder reported. During her last hospitalization the patient did have undergone multiple procedures and had a Foley catheter placement.  The patient is coming from SNF. And at her baseline dependent for most of her ADL.  Review of Systems: as mentioned in the history of present illness.  A Comprehensive review of the other systems is  negative.  Past Medical History  Diagnosis Date  . Diabetes mellitus with diabetic polyneuropathy   . A-fib   . Renal disorder   . Hypertension   . Anemia   . Glaucoma     right eye   . Carotid artery stenosis   . PVD (peripheral vascular disease)    Past Surgical History  Procedure Laterality Date  . Replacement total knee Right   . Carotid endarterectomy  2015  . Coronary artery bypass graft    . Ablation      cardiac for arrthymia    Social History:  reports that she has never smoked. She does not have any smokeless tobacco history on file. She reports that she does not drink alcohol or use illicit drugs.  Allergies  Allergen Reactions  . Septra [Sulfamethoxazole-Trimethoprim] Hives    History reviewed. No pertinent family history.  Prior to Admission medications   Medication Sig Start Date End Date Taking? Authorizing Provider  acetaminophen (TYLENOL) 500 MG tablet Take 1,000 mg by mouth 3 (three) times daily as needed (pain).   Yes Historical Provider, MD  allopurinol (ZYLOPRIM) 100 MG tablet Take 100 mg by mouth 2 (two) times daily.    Yes Historical Provider, MD  amiodarone (PACERONE) 200 MG tablet Place 1 tablet (200 mg total) into feeding tube daily. 09/26/14  Yes Layne Benton, NP  atorvastatin (LIPITOR) 80 MG tablet Take 80 mg by mouth daily.   Yes Historical Provider, MD  Cholecalciferol (VITAMIN D3) 2000 UNITS TABS Take 1 tablet by mouth daily.   Yes Historical Provider, MD  Cyanocobalamin (VITAMIN B-12 PO) Take 1 tablet by mouth daily.  Yes Historical Provider, MD  diltiazem (CARDIZEM) 30 MG tablet Take 1 tablet (30 mg total) by mouth every 6 (six) hours. 09/26/14  Yes Layne BentonSharon L Biby, NP  feeding supplement, ENSURE COMPLETE, (ENSURE COMPLETE) LIQD Take 237 mLs by mouth 3 (three) times daily with meals. 09/30/14  Yes Marvel PlanJindong Xu, MD  ferrous sulfate 325 (65 FE) MG tablet Take 325 mg by mouth daily with breakfast.   Yes Historical Provider, MD  furosemide (LASIX) 20  MG tablet Take 20 mg by mouth daily.   Yes Historical Provider, MD  metFORMIN (GLUCOPHAGE) 500 MG tablet Take 500 mg by mouth daily with breakfast.    Yes Historical Provider, MD  metoprolol tartrate (LOPRESSOR) 25 mg/10 mL SUSP Place 40 mLs (100 mg total) into feeding tube 2 (two) times daily. 09/30/14  Yes Layne BentonSharon L Biby, NP  pantoprazole sodium (PROTONIX) 40 mg/20 mL PACK Place 20 mLs (40 mg total) into feeding tube daily. 09/26/14  Yes Layne BentonSharon L Biby, NP  QUEtiapine (SEROQUEL) 12.5 mg TABS tablet Take 12.5 mg by mouth 2 (two) times daily.   Yes Historical Provider, MD  Travoprost, BAK Free, (TRAVATAN) 0.004 % SOLN ophthalmic solution Place 1 drop into the right eye at bedtime.   Yes Historical Provider, MD  atorvastatin (LIPITOR) 20 MG tablet Take 1 tablet (20 mg total) by mouth daily at 6 PM. Patient not taking: Reported on 10/08/2014 09/30/14   Layne BentonSharon L Biby, NP  cholecalciferol (VITAMIN D) 1000 UNITS tablet Take 1,000 Units by mouth every other day.    Historical Provider, MD  ciprofloxacin (CIPRO) 250 MG tablet Take 1 tablet (250 mg total) by mouth 2 (two) times daily. Patient not taking: Reported on 10/08/2014 09/30/14   Marvel PlanJindong Xu, MD  cloNIDine (CATAPRES) 0.1 MG tablet Place 1 tablet (0.1 mg total) into feeding tube 2 (two) times daily. Patient not taking: Reported on 10/08/2014 09/30/14   Layne BentonSharon L Biby, NP  Cyanocobalamin (VITAMIN B-12 PO) Take 1 tablet by mouth every other day.    Historical Provider, MD  furosemide (LASIX) 20 MG tablet Take 20 mg by mouth 2 (two) times daily.     Historical Provider, MD  Nutritional Supplements (FEEDING SUPPLEMENT, VITAL AF 1.2 CAL,) LIQD Place 270 mLs into feeding tube 4 (four) times daily - after meals and at bedtime. Patient not taking: Reported on 10/08/2014 09/26/14   Layne BentonSharon L Biby, NP  potassium chloride 20 MEQ/15ML (10%) SOLN Place 15 mLs (20 mEq total) into feeding tube daily. Patient not taking: Reported on 10/08/2014 09/30/14   Layne BentonSharon L Biby, NP   QUEtiapine (SEROQUEL) 12.5 mg TABS tablet Take 0.5 tablets (12.5 mg total) by mouth 3 (three) times daily. 09/30/14   Layne BentonSharon L Biby, NP  Water For Irrigation, Sterile (FREE WATER) SOLN Place 300 mLs into feeding tube every 4 (four) hours. 09/30/14   Layne BentonSharon L Biby, NP    Physical Exam: Filed Vitals:   10/08/14 2230 10/08/14 2245 10/09/14 0000 10/09/14 0058  BP: 114/62 130/69 150/68 175/67  Pulse: 99 106 115 108  Temp:    98.5 F (36.9 C)  TempSrc:    Axillary  Resp: 20 17 17 21   Height:    4\' 11"  (1.499 m)  Weight:    62.732 kg (138 lb 4.8 oz)  SpO2: 99% 100% 100% 100%    General: Alert, Awake and not following commands. Appear in moderate distress Eyes: PERRL ENT: Oral Mucosa clear dry. Neck: no JVD Cardiovascular: S1 and S2 Present, aortic systolic Murmur,  Peripheral Pulses Present Respiratory: Bilateral Air entry equal and Decreased, Clear to Auscultation, noCrackles, no wheezes Abdomen: Bowel Sound present, Soft and non tender Skin: no Rash Extremities: trace Pedal edema, no calf tenderness Neurologic:  The patient is awake and alert but not following commands. Occasionally moans but no response with words. Spontaneously moves all extremities and withdraws to pain all extremities  Labs on Admission:  CBC:  Recent Labs Lab 10/08/14 2021  WBC 8.5  HGB 10.5*  HCT 34.7*  MCV 96.9  PLT 263    CMP     Component Value Date/Time   NA 138 10/08/2014 2021   K 4.9 10/08/2014 2107   CL 102 10/08/2014 2021   CO2 22 10/08/2014 2021   GLUCOSE 111* 10/08/2014 2021   BUN 56* 10/08/2014 2021   CREATININE 1.40* 10/08/2014 2021   CALCIUM 8.6 10/08/2014 2021   PROT 7.1 10/08/2014 2021   ALBUMIN 2.4* 10/08/2014 2021   AST 90* 10/08/2014 2021   ALT 56* 10/08/2014 2021   ALKPHOS 78 10/08/2014 2021   BILITOT 0.2* 10/08/2014 2021   GFRNONAA 33* 10/08/2014 2021   GFRAA 39* 10/08/2014 2021    No results for input(s): LIPASE, AMYLASE in the last 168 hours. No results for  input(s): AMMONIA in the last 168 hours.  No results for input(s): CKTOTAL, CKMB, CKMBINDEX, TROPONINI in the last 168 hours. BNP (last 3 results)  Recent Labs  09/16/14 1730  PROBNP 9594.0*    Radiological Exams on Admission: Ct Head Wo Contrast  10/08/2014   CLINICAL DATA:  Increasing weakness and lethargy since Saturday, recent UTIs, personal history of diabetes, renal disorder, hypertension  EXAM: CT HEAD WITHOUT CONTRAST  TECHNIQUE: Contiguous axial images were obtained from the base of the skull through the vertex without intravenous contrast.  COMPARISON:  09/26/2014  FINDINGS: Minimal atrophy.  Mild prominence of the ventricular system little changed.  Small vessel chronic ischemic changes of deep cerebral white matter.  Intraventricular blood identified at the occipital horns of the lateral ventricles on the previous exam no longer identified.  Focus of high attenuation at the RIGHT parietal lobe likely tiny calcification, unchanged since 09/04/2014 making this unlikely to represent blood.  No definite high attenuation acute intracranial hemorrhage, mass lesion or evidence acute infarction.  No extra-axial fluid collections.  Atherosclerotic calcifications at the carotid siphons.  Scattered motion artifacts.  Prior burr hole RIGHT frontal.  Hyperostosis frontalis interna.  No acute osseous findings.  IMPRESSION: Motion artifacts.  Atrophy with mild persistent prominence of the ventricular system and small vessel chronic ischemic changes.  Resolution of minimal intraventricular blood seen on the previous exam.   Electronically Signed   By: Ulyses Southward M.D.   On: 10/08/2014 22:13   Dg Chest Port 1 View  10/08/2014   CLINICAL DATA:  Initial evaluation for weakness and lethargy for 3 days with altered mental status  EXAM: PORTABLE CHEST - 1 VIEW  COMPARISON:  09/30/2014  FINDINGS: The heart size and mediastinal contours are within normal limits. Both lungs are clear. The visualized skeletal  structures are unremarkable.  IMPRESSION: No active disease.   Electronically Signed   By: Esperanza Heir M.D.   On: 10/08/2014 21:32    EKG: Independently reviewed. Atrial flutter with controlled.  Assessment/Plan Principal Problem:   Acute encephalopathy Active Problems:   Diabetes mellitus with diabetic polyneuropathy   A-fib   Hypertension   Intracerebral hemorrhage   Tremor   UTI (lower urinary tract infection)   Aphasia  1. Acute encephalopathy The patient is presenting with complaints of confusion and aphasia with not following command. CT of the head is negative for any acute abnormality. Patient's urine showsevidence of UTI. Other lab work are unremarkable. Patient's daughter mentions that she has been having tremor-like episodes on and off and has not been responding to any verbal commands during the day. With this the patient will be admitted in telemetry. I would treat her with IV cefepime for UTI and follow cultures. Neurology will be consulted for further workup related to her encephalopathy as well as potential differential includes recurrent stroke as well as possible nonconvulsive epilepticus. She will remain nothing by mouth at present and I would open speech therapy consultation as well as physical therapy and occupational therapy consultation in the morning.  2.A flutter, A. Fib. Continue Lopressor and amiodarone  3.diabetes mellitus. Patient will remain nothing by mouth at present I would place her on sliding scale.  4.essential hypertension. Patient's blood pressure slightly elevated. At present I would continue her on her home Lopressor dose.  Advance goals of care discussion: full code   Consults: neurology  DVT Prophylaxis: mechanical compression device due to recent hemorrhage Nutrition: nothing by mouth  Family Communication: daughter was present at bedside, opportunity was given to ask question and all questions were answered satisfactorily  at the time of interview. Disposition: Admitted to inpatient in telemetry unit.  Author: Lynden OxfordPranav Hendry Speas, MD [facility] Pager: [phone] 10/09/2014, 2:33 AM    If 7PM-7AM, please contact night-coverage [email or web address] Password [email or web address]

## 2014-10-09 NOTE — Progress Notes (Signed)
ANTIBIOTIC CONSULT NOTE - INITIAL  Pharmacy Consult for cefepime  Indication: UTI or pyelonephritis.   Allergies  Allergen Reactions  . Septra [Sulfamethoxazole-Trimethoprim] Hives    Patient Measurements: Height: 4\' 11"  (149.9 cm) Weight: 138 lb 4.8 oz (62.732 kg) IBW/kg (Calculated) : 43.2 Adjusted Body Weight:   Vital Signs: Temp: 98.5 F (36.9 C) (11/19 0058) Temp Source: Axillary (11/19 0058) BP: 175/67 mmHg (11/19 0058) Pulse Rate: 108 (11/19 0058) Intake/Output from previous day:   Intake/Output from this shift:    Labs:  Recent Labs  10/08/14 2021  WBC 8.5  HGB 10.5*  PLT 263  CREATININE 1.40*   Estimated Creatinine Clearance: 24.1 mL/min (by C-G formula based on Cr of 1.4). No results for input(s): VANCOTROUGH, VANCOPEAK, VANCORANDOM, GENTTROUGH, GENTPEAK, GENTRANDOM, TOBRATROUGH, TOBRAPEAK, TOBRARND, AMIKACINPEAK, AMIKACINTROU, AMIKACIN in the last 72 hours.   Microbiology: Recent Results (from the past 720 hour(s))  CSF culture     Status: None   Collection Time: 09/11/14 10:08 AM  Result Value Ref Range Status   Specimen Description CSF  Final   Special Requests NO 3 CUP  Final   Gram Stain   Final    WBC PRESENT,BOTH PMN AND MONONUCLEAR NO ORGANISMS SEEN CYTOSPIN Performed at [facility] Performed at [facility]   Culture   Final    NO GROWTH 3 DAYS Performed at [facility]   Report Status 09/14/2014 FINAL  Final  Gram stain - STAT with CSF culture     Status: None   Collection Time: 09/11/14 10:08 AM  Result Value Ref Range Status   Specimen Description CSF  Final   Special Requests NO 3 CUP  Final   Gram Stain   Final    cytospin slide WBC PRESENT,BOTH PMN AND MONONUCLEAR NO ORGANISMS SEEN   Report Status 09/11/2014 FINAL  Final  Culture, Urine     Status: None   Collection Time: 09/12/14 10:59 PM  Result Value Ref Range Status   Specimen Description URINE, CATHETERIZED  Final   Special Requests NONE  Final    Culture  Setup Time   Final    09/13/2014 05:17 Performed at [facility]   Colony Count   Final    >=100,000 COLONIES/ML Performed at [facility]   Culture YEAST Performed at [facility]  Final   Report Status 09/15/2014 FINAL  Final  Culture, blood (routine x 2)     Status: None   Collection Time: 09/12/14 11:50 PM  Result Value Ref Range Status   Specimen Description BLOOD RIGHT ARM  Final   Special Requests BOTTLES DRAWN AEROBIC ONLY 10CC  Final   Culture  Setup Time   Final    09/13/2014 03:47 Performed at [facility]   Culture   Final    NO GROWTH 5 DAYS Performed at [facility]   Report Status 09/19/2014 FINAL  Final  Culture, blood (routine x 2)     Status: None   Collection Time: 09/12/14 11:59 PM  Result Value Ref Range Status   Specimen Description BLOOD RIGHT HAND  Final   Special Requests BOTTLES DRAWN AEROBIC ONLY 5CC  Final   Culture  Setup Time   Final    09/13/2014 03:47 Performed at [facility]   Culture   Final    NO GROWTH 5 DAYS Performed at [facility]   Report Status 09/19/2014 FINAL  Final  Clostridium Difficile by PCR  Status: None   Collection Time: 09/17/14  2:42 PM  Result Value Ref Range Status   C difficile by pcr NEGATIVE NEGATIVE Final  Clostridium Difficile by PCR     Status: None   Collection Time: 09/22/14  5:30 PM  Result Value Ref Range Status   C difficile by pcr NEGATIVE NEGATIVE Final    Medical History: Past Medical History  Diagnosis Date  . Diabetes mellitus with diabetic polyneuropathy   . A-fib   . Renal disorder   . Hypertension   . Anemia   . Glaucoma     right eye   . Carotid artery stenosis   . PVD (peripheral vascular disease)     Medications:  Prescriptions prior to admission  Medication Sig Dispense Refill Last Dose  . acetaminophen (TYLENOL) 500 MG tablet Take 1,000 mg by mouth 3 (three) times daily as needed (pain).   unknown at unknown   . allopurinol (ZYLOPRIM) 100 MG tablet Take 100 mg by mouth 2 (two) times daily.    10/08/2014 at Unknown time  . amiodarone (PACERONE) 200 MG tablet Place 1 tablet (200 mg total) into feeding tube daily. 30 tablet 2 10/08/2014 at Unknown time  . atorvastatin (LIPITOR) 80 MG tablet Take 80 mg by mouth daily.   10/07/2014 at Unknown time  . Cholecalciferol (VITAMIN D3) 2000 UNITS TABS Take 1 tablet by mouth daily.   10/08/2014 at Unknown time  . Cyanocobalamin (VITAMIN B-12 PO) Take 1 tablet by mouth daily.   Past Week at Unknown time  . diltiazem (CARDIZEM) 30 MG tablet Take 1 tablet (30 mg total) by mouth every 6 (six) hours. 120 tablet 2 10/08/2014 at Unknown time  . feeding supplement, ENSURE COMPLETE, (ENSURE COMPLETE) LIQD Take 237 mLs by mouth 3 (three) times daily with meals. 15 Bottle 0 unknown at unknown  . ferrous sulfate 325 (65 FE) MG tablet Take 325 mg by mouth daily with breakfast.   10/08/2014 at Unknown time  . furosemide (LASIX) 20 MG tablet Take 20 mg by mouth daily.   10/08/2014 at Unknown time  . metFORMIN (GLUCOPHAGE) 500 MG tablet Take 500 mg by mouth daily with breakfast.    10/08/2014 at Unknown time  . metoprolol tartrate (LOPRESSOR) 25 mg/10 mL SUSP Place 40 mLs (100 mg total) into feeding tube 2 (two) times daily. 2400 mL 2 10/08/2014 at 0800  . pantoprazole sodium (PROTONIX) 40 mg/20 mL PACK Place 20 mLs (40 mg total) into feeding tube daily. 30 each 2 10/08/2014 at Unknown time  . QUEtiapine (SEROQUEL) 12.5 mg TABS tablet Take 12.5 mg by mouth 2 (two) times daily.   10/08/2014 at Unknown time  . Travoprost, BAK Free, (TRAVATAN) 0.004 % SOLN ophthalmic solution Place 1 drop into the right eye at bedtime.   10/07/2014 at Unknown time  . atorvastatin (LIPITOR) 20 MG tablet Take 1 tablet (20 mg total) by mouth daily at 6 PM. (Patient not taking: Reported on 10/08/2014) 30 tablet 2   . cholecalciferol (VITAMIN D) 1000 UNITS tablet Take 1,000 Units by mouth every other day.    Past Week at Unknown time  . ciprofloxacin (CIPRO) 250 MG tablet Take 1 tablet (250 mg total) by mouth 2 (two) times daily. (Patient not taking: Reported on 10/08/2014) 6 tablet 0   . cloNIDine (CATAPRES) 0.1 MG tablet Place 1 tablet (0.1 mg total) into feeding tube 2 (two) times daily. (Patient not taking: Reported on 10/08/2014) 60 tablet 11   . Cyanocobalamin (VITAMIN B-12  PO) Take 1 tablet by mouth every other day.   Past Week at Unknown time  . furosemide (LASIX) 20 MG tablet Take 20 mg by mouth 2 (two) times daily.    09/01/2014 at 0000  . Nutritional Supplements (FEEDING SUPPLEMENT, VITAL AF 1.2 CAL,) LIQD Place 270 mLs into feeding tube 4 (four) times daily - after meals and at bedtime. (Patient not taking: Reported on 10/08/2014) 1000 mL prn   . potassium chloride 20 MEQ/15ML (10%) SOLN Place 15 mLs (20 mEq total) into feeding tube daily. (Patient not taking: Reported on 10/08/2014) 450 mL 0   . QUEtiapine (SEROQUEL) 12.5 mg TABS tablet Take 0.5 tablets (12.5 mg total) by mouth 3 (three) times daily. 90 tablet 2   . Water For Irrigation, Sterile (FREE WATER) SOLN Place 300 mLs into feeding tube every 4 (four) hours. 54000 mL prn unknown at unknown   Assessment: Acute encephalopathy and UTI with AMS. Cefepime for UTI vs Pyelonephritis. Renal adjusted.   Goal of Therapy:  Renal dose of cefepime.   Plan:  Cefepime 1 gm q24h   Janice Coffinarl, Eldean Nanna Jonathan 10/09/2014,2:23 AM

## 2014-10-09 NOTE — Progress Notes (Signed)
PT Cancellation Note  Patient Details Name: Susan Calhoun MRN: [id number] DOB: 02/13/1928   Cancelled Treatment:    Reason Eval/Treat Not Completed: Patient not medically ready. Per RN, pt not appropriate for PT eval at this time, as she is unable to follow commands. Pt is also waiting on MRI to r/o CVA. Will follow up tomorrow.   Susan Calhoun, Susan Calhoun 10/09/2014, 12:18 PM   Susan Calhoun, PT, DPT Acute Rehabilitation Services Pager: [phone]

## 2014-10-09 NOTE — Progress Notes (Signed)
Pt seen and examined, admitted this am per Dr.Patel 86/F admitted with recent CVA with IVH, hydrocephalous and h/o temporary drain with fluctuating mental status then, [facility] She had multiple EEGs including LTM done showing general slowing and frequent intermittent triphasic waves. She did have generalized tremors during hospital stay with no EEG correlate. Now admitted with worsening encephalopathy for 3-4days Neuro following, EEG done Will order MRI Ceftraixone for possible UTI, FU urine Cx  Zannie CovePreetha Kemontae Dunklee, MD [phone]

## 2014-10-09 NOTE — Evaluation (Signed)
Clinical/Bedside Swallow Evaluation Patient Details  Name: Susan Calhoun MRN: [id number] Date of Birth: 04/09/1928  Today's Date: 10/09/2014 Time: 1400-1416 SLP Time Calculation (min) (ACUTE ONLY): 16 min  Past Medical History:  Past Medical History  Diagnosis Date  . Diabetes mellitus with diabetic polyneuropathy   . A-fib   . Renal disorder   . Hypertension   . Anemia   . Glaucoma     right eye   . Carotid artery stenosis   . PVD (peripheral vascular disease)    Past Surgical History:  Past Surgical History  Procedure Laterality Date  . Replacement total knee Right   . Carotid endarterectomy  2015  . Coronary artery bypass graft    . Ablation      cardiac for arrthymia    HPI:  80 y.o. female with Past medical history of diabetes mellitus, chronic kidney disease, hypertension, peripheral vascular disease, recent CVA with intraventricular hemorrhage status post drain placement and removal.  She has a PEG from previous admission as well as consuming mechanical soft diet and thin at SNF.  Pt. found to have UTI.  CXR No active disease.   Assessment / Plan / Recommendation Clinical Impression  Pt. unable to consume po's at present time due to cognitive impairments with poor awareness and attention.  Given total verbal/tactile/visual assist pt. unable to open oral cavity to accept ice chip, thin liquid or puree texture.  Prognosis is good as cognition improves.  Recommend continue NPO, use PEG and ST will return for ability to initiate po's.    Aspiration Risk  Severe    Diet Recommendation NPO   Medication Administration: Via alternative means    Other  Recommendations Oral Care Recommendations: Oral care BID   Follow Up Recommendations  Skilled Nursing facility    Frequency and Duration min 2x/week  2 weeks   Pertinent Vitals/Pain No indications pain         Swallow Study       Oral/Motor/Sensory Function Overall Oral Motor/Sensory Function:  (pt. unable)    Ice Chips Ice chips: Impaired Presentation: Spoon Oral Phase Impairments:  (did not open oral cavity)   Thin Liquid Thin Liquid: Impaired Presentation: Spoon Oral Phase Impairments: Poor awareness of bolus (did not open oral cavity)    Nectar Thick Nectar Thick Liquid: Not tested   Honey Thick Honey Thick Liquid: Not tested   Puree Puree: Impaired (no attempts to consume)   Solid   GO    Solid: Not tested       Susan Calhoun, Susan Calhoun 10/09/2014,2:46 PM  Breck CoonsLisa Calhoun Lonell Calhoun M.Ed [employer] Pager [phone]

## 2014-10-09 NOTE — Progress Notes (Signed)
STAT EEG completed  

## 2014-10-09 NOTE — Plan of Care (Signed)
Problem: Phase I Progression Outcomes Goal: Pain controlled with appropriate interventions Outcome: Completed/Met Date Met:  10/09/14 Goal: Vital Signs stable- temperature less than 102 Outcome: Completed/Met Date Met:  10/09/14 Goal: Initial discharge plan identified Outcome: Completed/Met Date Met:  10/09/14 Goal: Voiding-avoid urinary catheter unless indicated Outcome: Completed/Met Date Met:  10/09/14     

## 2014-10-09 NOTE — Progress Notes (Signed)
Called ED for report. Room ready for admit.  

## 2014-10-10 ENCOUNTER — Inpatient Hospital Stay (HOSPITAL_COMMUNITY): Payer: Medicare (Managed Care)

## 2014-10-10 DIAGNOSIS — E0842 Diabetes mellitus due to underlying condition with diabetic polyneuropathy: Secondary | ICD-10-CM

## 2014-10-10 LAB — CBC
HEMATOCRIT: 36.6 % (ref 36.0–46.0)
Hemoglobin: 11.2 g/dL — ABNORMAL LOW (ref 12.0–15.0)
MCH: 29.3 pg (ref 26.0–34.0)
MCHC: 30.6 g/dL (ref 30.0–36.0)
MCV: 95.8 fL (ref 78.0–100.0)
Platelets: 271 10*3/uL (ref 150–400)
RBC: 3.82 MIL/uL — AB (ref 3.87–5.11)
RDW: 16.3 % — ABNORMAL HIGH (ref 11.5–15.5)
WBC: 7.5 10*3/uL (ref 4.0–10.5)

## 2014-10-10 LAB — BASIC METABOLIC PANEL
Anion gap: 15 (ref 5–15)
BUN: 32 mg/dL — AB (ref 6–23)
CHLORIDE: 102 meq/L (ref 96–112)
CO2: 23 meq/L (ref 19–32)
CREATININE: 1.25 mg/dL — AB (ref 0.50–1.10)
Calcium: 8.8 mg/dL (ref 8.4–10.5)
GFR calc Af Amer: 44 mL/min — ABNORMAL LOW (ref >90)
GFR calc non Af Amer: 38 mL/min — ABNORMAL LOW (ref >90)
Glucose, Bld: 122 mg/dL — ABNORMAL HIGH (ref 70–99)
POTASSIUM: 4.1 meq/L (ref 3.7–5.3)
Sodium: 140 mEq/L (ref 137–147)

## 2014-10-10 LAB — GLUCOSE, CAPILLARY
GLUCOSE-CAPILLARY: 130 mg/dL — AB (ref 70–99)
Glucose-Capillary: 109 mg/dL — ABNORMAL HIGH (ref 70–99)
Glucose-Capillary: 135 mg/dL — ABNORMAL HIGH (ref 70–99)
Glucose-Capillary: 150 mg/dL — ABNORMAL HIGH (ref 70–99)
Glucose-Capillary: 92 mg/dL (ref 70–99)
Glucose-Capillary: 96 mg/dL (ref 70–99)

## 2014-10-10 LAB — AMMONIA: Ammonia: 14 umol/L (ref 11–60)

## 2014-10-10 MED ORDER — VITAL AF 1.2 CAL PO LIQD: 1000.0000 mL | Freq: Four times a day (QID) | ORAL | Status: DC

## 2014-10-10 MED ORDER — VITAL AF 1.2 CAL PO LIQD
280.0000 mL | Freq: Four times a day (QID) | ORAL | Status: DC
  Administered 2014-10-10 – 2014-10-21 (×41): 280 mL
  Filled 2014-10-10 (×48): qty 1000

## 2014-10-10 MED ORDER — JEVITY 1.2 CAL PO LIQD
1000.0000 mL | ORAL | Status: DC
  Filled 2014-10-10 (×2): qty 1000

## 2014-10-10 MED ORDER — ASPIRIN 325 MG PO TABS
325.0000 mg | ORAL_TABLET | Freq: Every day | ORAL | Status: DC
  Administered 2014-10-10 – 2014-10-21 (×12): 325 mg
  Filled 2014-10-10 (×12): qty 1

## 2014-10-10 MED ORDER — LORAZEPAM 2 MG/ML IJ SOLN
INTRAMUSCULAR | Status: AC
  Administered 2014-10-10: 1 mg via INTRAVENOUS
  Filled 2014-10-10: qty 1

## 2014-10-10 MED ORDER — LATANOPROST 0.005 % OP SOLN
1.0000 [drp] | Freq: Every day | OPHTHALMIC | Status: DC
  Administered 2014-10-10 – 2014-10-20 (×11): 1 [drp] via OPHTHALMIC
  Filled 2014-10-10 (×2): qty 2.5

## 2014-10-10 MED ORDER — LORAZEPAM 2 MG/ML IJ SOLN
1.0000 mg | Freq: Once | INTRAMUSCULAR | Status: AC
  Administered 2014-10-10: 1 mg via INTRAVENOUS

## 2014-10-10 NOTE — Care Management Note (Signed)
CARE MANAGEMENT NOTE 10/10/2014  Patient:  Susan Calhoun,Susan Calhoun   Account Number:  [id number]  Date Initiated:  10/10/2014  Documentation initiated by:  Kamron Portee  Subjective/Objective Assessment:   CM following for progression and d/c planning.     Action/Plan:   10/10/2014 Pt is SNF resident and plan if for her to return to SNF for rehab.   Anticipated DC Date:  10/14/2014   Anticipated DC Plan:  SKILLED NURSING FACILITY         Choice offered to / List presented to:             Status of service:  In process, will continue to follow Medicare Important Message given?  YES (If response is "NO", the following Medicare IM given date fields will be blank) Date Medicare IM given:  10/10/2014 Medicare IM given by:  Fleur Audino Date Additional Medicare IM given:   Additional Medicare IM given by:    Discharge Disposition:    Per UR Regulation:    If discussed at Long Length of Stay Meetings, dates discussed:    Comments:

## 2014-10-10 NOTE — Procedures (Signed)
History: 80 yo F with h/o IVH and   Sedation: None  Technique: This is a 17 channel routine scalp EEG performed at the bedside with bipolar and monopolar montages arranged in accordance to the international 10/20 system of electrode placement. One channel was dedicated to EKG recording.    Background: The background consists of generalized irregular slow activity with superimposed frequent 1-1.5 Hz triphasic waves with a bifrontal shifting predominance and anterior posterior lag.  Photic stimulation: Physiologic driving is not performed  EEG Abnormalities: 1) triphasic waves 2) generalized slow activity  Clinical Interpretation: This EEG is consistent with a generalized cerebral dysfunction (encephalopathy). The presence of triphasic waves is most often associated with a toxic metabolic encephalopathy. Rarely this pattern has been suggested to be ictal, however it is much more consistent with an encephalopathy.  Ritta SlotMcNeill Jule Whitsel, MD [facility] [phone]  If 7pm- 7am, please page neurology on call as listed in AMION.

## 2014-10-10 NOTE — Progress Notes (Signed)
SLP Cancellation Note  Patient Details Name: Susan Calhoun MRN: [id number] DOB: 06/01/1928   Cancelled treatment:        Too lethargic to attempt po's. Received sedation for MRI. Will continue efforts   [name], Arantza Darrington Willis 10/10/2014, 2:03 PM  Breck CoonsLisa Willis Lonell FaceLitaker M.Ed [employer] Pager [phone]

## 2014-10-10 NOTE — Progress Notes (Addendum)
[facility] PROGRESS NOTE  Susan Calhoun ZOX:[id number] DOB: June 07, 1928 DOA: 10/08/2014 PCP: Pcp Not In System  Assessment/Plan: 1. Acute encephalopathy -admitted with recent CVA with IVH, hydrocephalous and h/o temporary drain with fluctuating mental status then, went to [facility] 11/10. She had multiple EEGs including LTM done showing general slowing and frequent intermittent triphasic waves. She did have generalized tremors during hospital stay with no EEG correlate -multifactorial due to CVA, UTI, possible seizures -EEG results noted more c/w encephalopathy -MRI today -on Cefepime for UTI, FU Urine Cx-100k colonies GNR -Neuro following -resume TFs via G tube  2.A flutter, A. Fib. Continue Lopressor, cardizem and amiodarone  3.diabetes mellitus. -SSI for now  4.essential hypertension. -stable continue toprol and cardizem  5. Recent CVA -resume ASA -via Gtube, statin  Code Status: Full Code Family Communication: d/w daughter and granddaughter at bedside Disposition Plan: SNF when stable   Consultants:  Neurology  Procedures:  EEG  Antibiotics:  Ceftriaxone  HPI/Subjective: More lucid today  Objective: Filed Vitals:   10/10/14 1000  BP: 154/88  Pulse: 61  Temp: 98.8 F (37.1 C)  Resp: 18    Intake/Output Summary (Last 24 hours) at 10/10/14 1336 Last data filed at 10/10/14 0900  Gross per 24 hour  Intake    650 ml  Output      0 ml  Net    650 ml   Filed Weights   10/08/14 2029 10/09/14 0058 10/09/14 2038  Weight: 63.504 kg (140 lb) 62.732 kg (138 lb 4.8 oz) 63.685 kg (140 lb 6.4 oz)    Exam:   General:  More alert today, tracking, but not speaking  Cardiovascular: S1S2/RRR  Respiratory: CTAB  Abdomen: soft, NT, BS present  Musculoskeletal: no edema c/c   Data Reviewed: Basic Metabolic Panel:  Recent Labs Lab 10/08/14 2021 10/08/14 2107 10/09/14 0325 10/10/14 0642  NA 138  --  144 140  K >7.7* 4.9 4.4 4.1  CL 102  --   107 102  CO2 22  --  22 23  GLUCOSE 111*  --  177* 122*  BUN 56*  --  50* 32*  CREATININE 1.40*  --  1.50* 1.25*  CALCIUM 8.6  --  8.5 8.8   Liver Function Tests:  Recent Labs Lab 10/08/14 2021 10/09/14 0325  AST 90* 35  ALT 56* 50*  ALKPHOS 78 85  BILITOT 0.2* <0.2*  PROT 7.1 6.6  ALBUMIN 2.4* 2.3*   No results for input(s): LIPASE, AMYLASE in the last 168 hours. No results for input(s): AMMONIA in the last 168 hours. CBC:  Recent Labs Lab 10/08/14 2021 10/09/14 0325 10/10/14 0642  WBC 8.5 7.5 7.5  NEUTROABS  --  4.8  --   HGB 10.5* 10.5* 11.2*  HCT 34.7* 34.8* 36.6  MCV 96.9 96.7 95.8  PLT 263 260 271   Cardiac Enzymes: No results for input(s): CKTOTAL, CKMB, CKMBINDEX, TROPONINI in the last 168 hours. BNP (last 3 results)  Recent Labs  09/16/14 1730  PROBNP 9594.0*   CBG:  Recent Labs Lab 10/09/14 1657 10/09/14 2040 10/10/14 0030 10/10/14 0644 10/10/14 1222  GLUCAP 89 109* 150* 130* 96    Recent Results (from the past 240 hour(s))  Urine culture     Status: None (Preliminary result)   Collection Time: 10/08/14  8:56 PM  Result Value Ref Range Status   Specimen Description URINE, CATHETERIZED  Final   Special Requests NONE  Final   Culture  Setup Time   Final  10/09/2014 05:03 Performed at [facility]    Colony Count   Final    >=100,000 COLONIES/ML Performed at [facility]    Culture   Final    GRAM NEGATIVE RODS Performed at [facility]    Report Status PENDING  Incomplete  MRSA PCR Screening     Status: None   Collection Time: 10/09/14  1:21 AM  Result Value Ref Range Status   MRSA by PCR NEGATIVE NEGATIVE Final    Comment:        The GeneXpert MRSA Assay (FDA approved for NASAL specimens only), is one component of a comprehensive MRSA colonization surveillance program. It is not intended to diagnose MRSA infection nor to guide or monitor treatment for MRSA infections.      Studies: Ct Head  Wo Contrast  10/08/2014   CLINICAL DATA:  Increasing weakness and lethargy since Saturday, recent UTIs, personal history of diabetes, renal disorder, hypertension  EXAM: CT HEAD WITHOUT CONTRAST  TECHNIQUE: Contiguous axial images were obtained from the base of the skull through the vertex without intravenous contrast.  COMPARISON:  09/26/2014  FINDINGS: Minimal atrophy.  Mild prominence of the ventricular system little changed.  Small vessel chronic ischemic changes of deep cerebral white matter.  Intraventricular blood identified at the occipital horns of the lateral ventricles on the previous exam no longer identified.  Focus of high attenuation at the RIGHT parietal lobe likely tiny calcification, unchanged since 09/04/2014 making this unlikely to represent blood.  No definite high attenuation acute intracranial hemorrhage, mass lesion or evidence acute infarction.  No extra-axial fluid collections.  Atherosclerotic calcifications at the carotid siphons.  Scattered motion artifacts.  Prior burr hole RIGHT frontal.  Hyperostosis frontalis interna.  No acute osseous findings.  IMPRESSION: Motion artifacts.  Atrophy with mild persistent prominence of the ventricular system and small vessel chronic ischemic changes.  Resolution of minimal intraventricular blood seen on the previous exam.   Electronically Signed   By: Ulyses SouthwardMark  Boles M.D.   On: 10/08/2014 22:13   Mr Brain Wo Contrast  10/10/2014   CLINICAL DATA:  Encephalopathy. Intraventricular hemorrhage and drain placement last month.  EXAM: MRI HEAD WITHOUT CONTRAST  TECHNIQUE: Multiplanar, multiecho pulse sequences of the brain and surrounding structures were obtained without intravenous contrast.  COMPARISON:  Head CT 09/28/2014 and MRI 09/12/2014  FINDINGS: Images are mildly to moderately degraded by motion artifact.  No acute infarct is identified. There is a small amount of diffusion weighted signal abnormality along the prior right frontal ventriculostomy  catheter tract likely secondary to prior intervention. No sizable intracranial hemorrhage is identified, although T2* images are particularly motion degraded. There may be minimal residual chronic blood products in the occipital horns.  The lateral and third ventricles remain moderately dilated but overall slightly improved compared to the prior MRI. Mild T2 hyperintensity in the periventricular white matter bilaterally does not appear significantly changed, nonspecific. No mass, midline shift, or extra-axial fluid collection is identified.  Prior bilateral cataract extraction is noted. There is mild right greater the left sphenoid sinus mucosal thickening. Mastoid air cells are clear. Major intracranial vascular flow voids are preserved.  IMPRESSION: 1. No evidence acute/new intracranial abnormality. 2. Slightly decreased ventricular dilatation since 09/12/2014 MRI.   Electronically Signed   By: Sebastian AcheAllen  Grady   On: 10/10/2014 12:22   Dg Chest Port 1 View  10/08/2014   CLINICAL DATA:  Initial evaluation for weakness and lethargy for 3 days with altered mental status  EXAM: PORTABLE CHEST - 1 VIEW  COMPARISON:  09/30/2014  FINDINGS: The heart size and mediastinal contours are within normal limits. Both lungs are clear. The visualized skeletal structures are unremarkable.  IMPRESSION: No active disease.   Electronically Signed   By: Esperanza Heiraymond  Rubner M.D.   On: 10/08/2014 21:32    Scheduled Meds: . allopurinol  100 mg Per Tube BID  . amiodarone  200 mg Per Tube Daily  . atorvastatin  80 mg Per Tube q1800  . ceFEPime (MAXIPIME) IV  1 g Intravenous Q24H  . diltiazem  30 mg Oral 4 times per day  . free water  300 mL Per Tube Q4H  . insulin aspart  0-15 Units Subcutaneous 4 times per day  . metoprolol tartrate  100 mg Per Tube BID  . pantoprazole sodium  40 mg Per Tube Daily  . sodium chloride  3 mL Intravenous Q12H   Continuous Infusions:  Antibiotics Given (last 72 hours)    Date/Time Action Medication  Dose Rate   10/09/14 0250 Given   ceFEPIme (MAXIPIME) 1 g in dextrose 5 % 50 mL IVPB 1 g 100 mL/hr   10/10/14 0207 Given   ceFEPIme (MAXIPIME) 1 g in dextrose 5 % 50 mL IVPB 1 g 100 mL/hr      Principal Problem:   Acute encephalopathy Active Problems:   Diabetes mellitus with diabetic polyneuropathy   A-fib   Hypertension   Intracerebral hemorrhage   Tremor   UTI (lower urinary tract infection)   Aphasia    Time spent: 25min    [facility] SystemJOSEPH,Azure Barrales  [facility] Pager [phone]. If 7PM-7AM, please contact night-coverage at [email or web address], password [email or web address] 10/10/2014, 1:36 PM  LOS: 2 days

## 2014-10-10 NOTE — Progress Notes (Signed)
INITIAL NUTRITION ASSESSMENT  DOCUMENTATION CODES Per approved criteria  -Not Applicable   INTERVENTION: - Vital AF @ 280 ml, 4 times daily to provide 1344 kcal and 84 g protein.  - Free water flushes of 125 ml QID to provide additional 500 ml of free water (1407 ml/day total)  NUTRITION DIAGNOSIS: Inadequate oral intake related to inability to eat as evidenced by NPO.   Goal: Pt to meet >/= 90% of their estimated nutrition needs   Monitor:  Weight trend, po intake, acceptance of supplements, labs  Reason for Assessment: Consult for TF initiation and management and low Braden score  80 y.o. female  Admitting Dx: Acute encephalopathy  ASSESSMENT: 80 y.o. female with Past medical history of diabetes mellitus, chronic kidney disease, hypertension, peripheral vascular disease, recent CVA with intraventricular hemorrhage status post drain placement and removal. The patient is presenting with complaint of confusion. She was brought in by her daughter from the nursing home. As per the daughter since last 3-4 days the patient has been having progressively worsening mental status.  - Pt was able to tolerate oral feeding as well as tube feeding at nursing home. For the past 3-4 days, pt has been unable to take po due to lack of appetite.  - Seen by SLP 11/19 and was recommended to remain NPO.   Height: Ht Readings from Last 1 Encounters:  10/09/14 4\' 11"  (1.499 m)    Weight: Wt Readings from Last 1 Encounters:  10/09/14 140 lb 6.4 oz (63.685 kg)    Ideal Body Weight: 43.2 kg  % Ideal Body Weight: 147%  Wt Readings from Last 10 Encounters:  10/09/14 140 lb 6.4 oz (63.685 kg)  09/15/14 146 lb 2.6 oz (66.3 kg)  08/31/14 140 lb (63.504 kg)    Usual Body Weight: 140-146 lbs  % Usual Body Weight: 100%  BMI:  Body mass index is 28.34 kg/(m^2).  Estimated Nutritional Needs: Kcal: 1300-1500 Protein: 75-90 g Fluid: 1.6 L/day  Skin: stage II pressure ulcer on coccyx, wound on  buttocks  Diet Order: Diet NPO time specified  EDUCATION NEEDS: -Education not appropriate at this time   Intake/Output Summary (Last 24 hours) at 10/10/14 1244 Last data filed at 10/10/14 0900  Gross per 24 hour  Intake    650 ml  Output      0 ml  Net    650 ml    Last BM: 11/19   Labs:   Recent Labs Lab 10/08/14 2021 10/08/14 2107 10/09/14 0325 10/10/14 0642  NA 138  --  144 140  K >7.7* 4.9 4.4 4.1  CL 102  --  107 102  CO2 22  --  22 23  BUN 56*  --  50* 32*  CREATININE 1.40*  --  1.50* 1.25*  CALCIUM 8.6  --  8.5 8.8  GLUCOSE 111*  --  177* 122*    CBG (last 3)   Recent Labs  10/10/14 0030 10/10/14 0644 10/10/14 1222  GLUCAP 150* 130* 96    Scheduled Meds: . allopurinol  100 mg Per Tube BID  . amiodarone  200 mg Per Tube Daily  . atorvastatin  80 mg Per Tube q1800  . ceFEPime (MAXIPIME) IV  1 g Intravenous Q24H  . diltiazem  30 mg Oral 4 times per day  . free water  300 mL Per Tube Q4H  . insulin aspart  0-15 Units Subcutaneous 4 times per day  . metoprolol tartrate  100 mg Per Tube BID  .  pantoprazole sodium  40 mg Per Tube Daily  . sodium chloride  3 mL Intravenous Q12H    Continuous Infusions:   Past Medical History  Diagnosis Date  . Diabetes mellitus with diabetic polyneuropathy   . A-fib   . Renal disorder   . Hypertension   . Anemia   . Glaucoma     right eye   . Carotid artery stenosis   . PVD (peripheral vascular disease)     Past Surgical History  Procedure Laterality Date  . Replacement total knee Right   . Carotid endarterectomy  2015  . Coronary artery bypass graft    . Ablation      cardiac for arrthymia     Emmaline KluverHaley Terrilyn Tyner MS, RD, LDN

## 2014-10-10 NOTE — Clinical Social Work Psychosocial (Addendum)
Clinical Social Work Department BRIEF PSYCHOSOCIAL ASSESSMENT 10/10/2014  Patient:  Susan Calhoun,Susan Calhoun     Account Number:  [id number]     Admit date:  10/08/2014  Clinical Social Worker:  Delmer IslamRAWFORD,Asta Corbridge, LCSW  Date/Time:  10/10/2014 04:23 AM  Referred by:  Physician  Date Referred:  10/10/2014 Referred for  SNF Placement   Other Referral:   Interview type:  Family Other interview type:    PSYCHOSOCIAL DATA Living Status:  FACILITY Ad[facility] Level of care:  Skilled Nursing Facility Primary support name:  Susan Calhoun Primary support relationship to patient:  CHILD, ADULT Degree of support available:   [name] and came to [place] to see her mother. Granddaughter Kenna GilbertMoonique was also at the bedside. CSW informed that patient lives with granddaughter.    CURRENT CONCERNS Current Concerns  Post-Acute Placement   Other Concerns:    SOCIAL WORK ASSESSMENT / PLAN [facility] is [facility] and CSW confirmed with daughter that patient will return to complete her rehab. Daughter not sure at this time if patient can return home; patient may need LTC.    During the conversation, Ms. Calhoun requested information on assistance with choosing another plan for her mother as this is Medicare open enrollment. Daughter given information on [employer] [facility] [place] and [facility] program. [name] thanked CSW for information and indicated that she will contact them.   Assessment/plan status:  Psychosocial Support/Ongoing Assessment of Needs Other assessment/ plan:   Information/referral to community resources:   Information on [employer] [place] SHIP[employer] program.    PATIENT'S/FAMILY'S RESPONSE TO PLAN OF CARE: Daughter and granddaughter very pleasant and receptive to talking with CSW. [facility] will [facility] when medically stable per daughter.  Call made to [facility]ie in admissions  at [facility] and clinicals forwarded to them : FL-2, H&P, meds and recent MD progress note.

## 2014-10-10 NOTE — Progress Notes (Signed)
Waiting on [employer] to start Pt feedings. Report given to RN. Materials/Equipment on the way.

## 2014-10-10 NOTE — Progress Notes (Addendum)
PT Cancellation Note  Patient Details Name: Jamesetta SoGiuseppina Coco MRN: [id number] DOB: 03/01/1928   Cancelled Treatment:    Reason Eval/Treat Not Completed: Patient at procedure or test/unavailable. Pt off unit at MRI. Will continue to check back as schedule allows and complete PT eval when available.   Addendum: Pt received medication at MRI which has lowered her level of arousal. Per SLP, pt is not aroused enough to participate in PT eval at this time. Will continue to follow.   Conni SlipperKirkman, Jock Mahon 10/10/2014, 12:03 PM, 1:53 PM  Conni SlipperLaura Laneah Luft, PT, DPT Acute Rehabilitation Services Pager: [phone]

## 2014-10-10 NOTE — Progress Notes (Signed)
Subjective: She continues to be encephalopathic, however did receive Ativan again for her MRI this morning.  She did not have any improvement with Ativan yesterday.  Exam: Filed Vitals:   10/10/14 1000  BP: 154/88  Pulse: 61  Temp: 98.8 F (37.1 C)  Resp: 18   Gen: In bed, NAD MS: Awake, regards examiner, does not follow commands WU:XLKGMCN:PERRL, EOMI Motor: Moves all extremities  Sensory:responds to nox stim x 4.    Impression: 80 yo F with altered mental status. EEG shows frequent triphasic waves which is typically associated with toxic metabolic encephalopathy. I would favor continued treatment of UTI with suspicion of metabolic encephalopathy secondary to UTI.   Recommendations: 1) check ammonia 2) continue treatment of UTI  Ritta SlotMcNeill Amayiah Gosnell, MD [facility] [phone]  If 7pm- 7am, please page neurology on call as listed in AMION.

## 2014-10-10 NOTE — Telephone Encounter (Signed)
Called nursing facility and spoke with Martie LeeSabrina, she states to disregard this message patient was sent out to the hospital, and [facility].

## 2014-10-10 NOTE — Progress Notes (Signed)
OT Cancellation Note  Patient Details Name: Susan Calhoun MRN: [id number] DOB: 08/02/1928   Cancelled Treatment:    Reason Eval/Treat Not Completed: Medical issues which prohibited therapy. Pt given Ativan for MRI and is still lethargic. Will re-attempt eval at later date.  [name], Raziel Koenigs Eva [phone] 10/10/2014, 3:04 PM

## 2014-10-11 LAB — GLUCOSE, CAPILLARY
GLUCOSE-CAPILLARY: 150 mg/dL — AB (ref 70–99)
GLUCOSE-CAPILLARY: 151 mg/dL — AB (ref 70–99)
Glucose-Capillary: 112 mg/dL — ABNORMAL HIGH (ref 70–99)
Glucose-Capillary: 119 mg/dL — ABNORMAL HIGH (ref 70–99)
Glucose-Capillary: 143 mg/dL — ABNORMAL HIGH (ref 70–99)
Glucose-Capillary: 187 mg/dL — ABNORMAL HIGH (ref 70–99)
Glucose-Capillary: 196 mg/dL — ABNORMAL HIGH (ref 70–99)
Glucose-Capillary: 231 mg/dL — ABNORMAL HIGH (ref 70–99)

## 2014-10-11 MED ORDER — SODIUM CHLORIDE 0.9 % IV SOLN
1.0000 g | Freq: Two times a day (BID) | INTRAVENOUS | Status: AC
  Administered 2014-10-11 – 2014-10-18 (×15): 1 g via INTRAVENOUS
  Filled 2014-10-11 (×17): qty 1

## 2014-10-11 NOTE — Progress Notes (Signed)
Speech Language Pathology Treatment: Dysphagia  Patient Details Name: Jamesetta SoGiuseppina Starr MRN: [id number] DOB: 10/31/1928 Today's Date: 10/11/2014 Time: [id number] SLP Time Calculation (min) (ACUTE ONLY): 9 min  Assessment / Plan / Recommendation Clinical Impression  SLP provided Total A for oral care via suctioning, including Total A for labial opening. Pt seals her lips with all attempts at bolus administration, despite Total cueing from SLP. No oral acceptance of any boluses observed today. Will continue to follow for PO readiness.   HPI HPI: 80 y.o. female with Past medical history of diabetes mellitus, chronic kidney disease, hypertension, peripheral vascular disease, recent CVA with intraventricular hemorrhage status post drain placement and removal.  She has a PEG from previous admission as well as consuming mechanical soft diet and thin at SNF.  Pt. found to have UTI.  CXR No active disease.   Pertinent Vitals Pain Assessment: Faces Faces Pain Scale: No hurt  SLP Plan  Continue with current plan of care    Recommendations Diet recommendations: NPO Medication Administration: Via alternative means              Oral Care Recommendations: Oral care Q4 per protocol Follow up Recommendations: Skilled Nursing facility;24 hour supervision/assistance Plan: Continue with current plan of care    GO      Maxcine HamLaura Paiewonsky, M.A. CCC-SLP [phone]  Maxcine Hamaiewonsky, Frankey Botting 10/11/2014, 10:12 AM

## 2014-10-11 NOTE — Progress Notes (Signed)
Subjective: She continues to be encephalopathic  Exam: Filed Vitals:   10/11/14 1702  BP: 111/35  Pulse: 79  Temp: 98.5 F (36.9 C)  Resp: 18   Gen: In bed, NAD MS: Awake, regards examiner, does not follow commands WU:JWJXBCN:PERRL, EOMI Motor: Moves all extremities  Sensory:responds to nox stim x 4.   UA - shows resistence to cephalosporins.    Impression: 80 yo F with altered mental status. EEG shows frequent triphasic waves which is typically associated with toxic metabolic encephalopathy. I would favor continued treatment of UTI with suspicion of metabolic encephalopathy secondary to UTI. Her persistent encephalopathy would be explained by her resistent organism.   Recommendations: 1) continue treatment of UTI 2) will continue to follow.   Ritta SlotMcNeill Ariday Brinker, MD [facility] [phone]  If 7pm- 7am, please page neurology on call as listed in AMION.

## 2014-10-11 NOTE — Progress Notes (Signed)
Occupational Therapy Evaluation Patient Details Name: Susan Calhoun MRN: [id number] DOB: 07/21/1928 Today's Date: 10/11/2014    History of Present Illness 80 y.o. female with Past medical history of diabetes mellitus, chronic kidney disease, hypertension, peripheral vascular disease, recent CVA with intraventricular hemorrhage status post drain placement and removal. She has a PEG from previous admission. Pt. found to have UTI. CXR No active disease.   Clinical Impression   PTA pt was at SNF for rehab and reportedly was making progress. Pt currently requires total A for all ADLs and demonstrates decreased arousal. Pt will benefit from acute OT to address arousal/cognition and ADLs and will benefit from SNF at discharge.     Follow Up Recommendations  SNF;Supervision/Assistance - 24 hour    Equipment Recommendations  Other (comment) (TBD at SNF)    Recommendations for Other Services       Precautions / Restrictions Precautions Precautions: Fall Precaution Comments: ventricular drain Restrictions Weight Bearing Restrictions: No      Mobility Bed Mobility Overal bed mobility: Needs Assistance;+ 2 for safety/equipment;+2 for physical assistance Bed Mobility: Supine to Sit;Sit to Supine     Supine to sit: Total assist;+2 for physical assistance Sit to supine: Total assist;+2 for physical assistance   General bed mobility comments: patient very lethargic, attempted EOB to arouse  Transfers                 General transfer comment: Did not attempt due to safety.     Balance Overall balance assessment: Needs assistance Sitting-balance support: No upper extremity supported;Feet supported Sitting balance-Leahy Scale: Poor Sitting balance - Comments: patient with ability to sit self supported for extended periods of time, during perturbation and trunk control activities patient was able to engaged trunk but demonstrates increased extensor response, unable to manage  repositioning without full assist Postural control: Posterior lean                                  ADL Overall ADL's : Needs assistance/impaired                                       General ADL Comments: Total assist for all ADL.Pt attempting to reach for face with L UE. guarded due to panda/multiple lines/tubes     Vision                 Additional Comments: brief periods of eyes open. Difficult to assess vision. Pt appears to be tracking in all visual fields. Pt turned head to sounds. To be further assessed.    Perception Perception Perception Tested?: No       Pertinent Vitals/Pain Pain Assessment: Faces Faces Pain Scale: No hurt        Extremity/Trunk Assessment Upper Extremity Assessment Upper Extremity Assessment: Difficult to assess due to impaired cognition;RUE deficits/detail;LUE deficits/detail RUE Deficits / Details: arthritic changes in hand, moderate edema; increased tone throughout. minimal voluntary movement RUE Coordination: decreased fine motor;decreased gross motor LUE Deficits / Details: arthritic changes in hand, moderate edema; increased tone. minimal voluntary movement LUE Coordination: decreased fine motor;decreased gross motor   Lower Extremity Assessment Lower Extremity Assessment: Defer to PT evaluation    Cervical / Trunk Assessment Cervical / Trunk Assessment: Other exceptions (head forward)   Communication Communication Communication: Other (comment) (non verbal)   Cognition Arousal/Alertness: Awake/alert;Lethargic Behavior  During Therapy: Restless;Anxious Overall Cognitive Status: Difficult to assess Area of Impairment: Attention;Following commands   Current Attention Level: Focused   Following Commands: Follows one step commands inconsistently;Follows one step commands with increased time       General Comments: patient very lethargic, some intermittent arousal with eyes opening and visual  tracking during EOB activity      Exercises Exercises:  (seated EOB for trunk control activities) Other Exercises Other Exercises: PROM x 4 extremities. Pt with active movement, however not on command        Home Living Family/patient expects to be discharged to:: Skilled nursing facility Living Arrangements: Other (Comment) (snf)                               Additional Comments: no family available for PLOF           OT Diagnosis: Generalized weakness;Cognitive deficits   OT Problem List: Decreased strength;Decreased activity tolerance;Impaired balance (sitting and/or standing);Decreased cognition;Impaired UE functional use   OT Treatment/Interventions: Therapeutic exercise;Therapeutic activities;Cognitive remediation/compensation;Patient/family education;Balance training    OT Goals(Current goals can be found in the care plan section) Acute Rehab OT Goals Patient Stated Goal: unable to state OT Goal Formulation: Patient unable to participate in goal setting Time For Goal Achievement: 10/25/14 Potential to Achieve Goals: Fair ADL Goals Pt Will Perform Grooming: with min assist;sitting Pt Will Perform Upper Body Bathing: with min assist;sitting Pt Will Perform Upper Body Dressing: with min assist;sitting Additional ADL Goal #1: Pt will sit on EOB with increased arousal to participate in 2 ADL tasks.   OT Frequency: Min 2X/week           Co-evaluation PT/OT/SLP Co-Evaluation/Treatment: Yes Reason for Co-Treatment: Complexity of the patient's impairments (multi-system involvement);Necessary to address cognition/behavior during functional activity;For patient/therapist safety PT goals addressed during session: Mobility/safety with mobility;Balance OT goals addressed during session: ADL's and self-care      End of Session Nurse Communication: Other (comment) (Gtube feed needs to be continued)  Activity Tolerance: Patient limited by lethargy Patient left:  in bed;with call bell/phone within reach   Time: 1251-1322 OT Time Calculation (min): 31 min Charges:  OT General Charges $OT Visit: 1 Procedure OT Evaluation $Initial OT Evaluation Tier I: 1 Procedure  Nena JordanMiller, Jarmon Javid M 10/11/2014, 3:02 PM   Carney LivingLeeAnn Marie Heela Heishman, OTR/L Occupational Therapist [phone] (pager)

## 2014-10-11 NOTE — Evaluation (Signed)
Physical Therapy Evaluation Patient Details Name: Susan Calhoun MRN: [id number] DOB: 10/03/1928 Today's Date: 10/11/2014   History of Present Illness  80 y.o. female with Past medical history of diabetes mellitus, chronic kidney disease, hypertension, peripheral vascular disease, recent CVA with intraventricular hemorrhage status post drain placement and removal. She has a PEG from previous admission. Pt. found to have UTI. CXR No active disease.  Clinical Impression  Patient demonstrates deficits in functional mobility as indicated below. Will need continued skilled PT to address deficits and maximize function. Will see as indicated and progress as tolerated. Recommend return to SNF when medically appropriate.   Spoke with family at length, educated regarding limited stimulation for cognitive recovery.    Follow Up Recommendations SNF;Supervision/Assistance - 24 hour    Equipment Recommendations  None recommended by PT    Recommendations for Other Services       Precautions / Restrictions Precautions Precautions: Fall Precaution Comments: ventricular drain Restrictions Weight Bearing Restrictions: No      Mobility  Bed Mobility Overal bed mobility: Needs Assistance;+ 2 for safety/equipment;+2 for physical assistance Bed Mobility: Supine to Sit;Sit to Supine     Supine to sit: Total assist;+2 for physical assistance Sit to supine: Total assist;+2 for physical assistance   General bed mobility comments: patient very lethargic, attempted EOB to arouse  Transfers                    Ambulation/Gait                Stairs            Wheelchair Mobility    Modified Rankin (Stroke Patients Only)       Balance Overall balance assessment: Needs assistance Sitting-balance support: No upper extremity supported;Feet supported Sitting balance-Leahy Scale: Poor Sitting balance - Comments: patient with ability to sit self supported for extended  periods of time, during perturbation and trunk control activities patient was able to engaged trunk but demonstrates increased extensor response, unable to manage repositioning without full assist Postural control: Posterior lean                                   Pertinent Vitals/Pain Pain Assessment: Faces Faces Pain Scale: No hurt    Home Living Family/patient expects to be discharged to:: Skilled nursing facility Living Arrangements: Other (Comment) (snf)               Additional Comments: no family available for PLOF    Prior Function                 Hand Dominance        Extremity/Trunk Assessment     RUE Deficits / Details: arthritic changes in hand, moderate edema     LUE Deficits / Details: arthritic changes in hand, moderate edema   Lower Extremity Assessment: Generalized weakness;RLE deficits/detail;LLE deficits/detail;Difficult to assess due to impaired cognition RLE Deficits / Details: some active ROM noted during session, unable to follow commands, + arthritic changes noted and PROM performed  (moderate edema noted) LLE Deficits / Details: some active ROM noted during session, unable to follow commands, + arthritic changes noted and PROM performed  (moderate edema noted)     Communication   Communication: Other (comment) (non verbal)  Cognition Arousal/Alertness: Awake/alert Behavior During Therapy: Restless;Anxious Overall Cognitive Status: Difficult to assess Area of Impairment: Attention;Following commands   Current Attention Level: Focused  Following Commands: Follows one step commands inconsistently;Follows one step commands with increased time       General Comments: patient very lethargic, some intermittent arousal with eyes opening and visual tracking during EOB activity    General Comments      Exercises General Exercises - Lower Extremity Ankle Circles/Pumps:  (with heel cord stretch) Other Exercises Other  Exercises: PROM x 4 extremities. Pt with active movement, however not on command      Assessment/Plan    PT Assessment Patient needs continued PT services  PT Diagnosis Altered mental status   PT Problem List Decreased strength;Decreased range of motion;Decreased activity tolerance;Decreased balance;Decreased mobility;Decreased coordination;Decreased cognition  PT Treatment Interventions DME instruction;Gait training;Stair training;Functional mobility training;Therapeutic activities;Therapeutic exercise;Balance training;Cognitive remediation;Patient/family education   PT Goals (Current goals can be found in the Care Plan section) Acute Rehab PT Goals Patient Stated Goal: unable to state PT Goal Formulation: Patient unable to participate in goal setting Time For Goal Achievement: 10/24/14 Potential to Achieve Goals: Good    Frequency Min 2X/week   Barriers to discharge        Co-evaluation PT/OT/SLP Co-Evaluation/Treatment: Yes Reason for Co-Treatment: Complexity of the patient's impairments (multi-system involvement);Necessary to address cognition/behavior during functional activity;For patient/therapist safety PT goals addressed during session: Mobility/safety with mobility;Balance         End of Session   Activity Tolerance: Patient limited by fatigue Patient left: in bed;with call bell/phone within reach;with bed alarm set;with family/visitor present Nurse Communication: Mobility status         Time: [id number] PT Time Calculation (min) (ACUTE ONLY): 31 min   Charges:   PT Evaluation $Initial PT Evaluation Tier I: 1 Procedure PT Treatments $Therapeutic Activity: 8-22 mins   PT G CodesFabio Asa:          Steffanie Mingle J 10/11/2014, 1:38 PM Charlotte Crumbevon Kimmie Doren, PT DPT  [phone]

## 2014-10-11 NOTE — Progress Notes (Signed)
ANTIBIOTIC CONSULT NOTE - FOLLOW-UP  Pharmacy Consult for meropenem Indication: ESBL UTI  Allergies  Allergen Reactions  . Septra [Sulfamethoxazole-Trimethoprim] Hives    Patient Measurements: Height: 4\' 11"  (149.9 cm) Weight: 140 lb 6.4 oz (63.685 kg) IBW/kg (Calculated) : 43.2 Adjusted Body Weight:   Vital Signs: Temp: 98.5 F (36.9 C) (11/21 1702) Temp Source: Oral (11/21 1702) BP: 111/35 mmHg (11/21 1702) Pulse Rate: 79 (11/21 1702) Intake/Output from previous day: 11/20 0701 - 11/21 0700 In: 3 [I.V.:3] Out: 0  Intake/Output from this shift:    Labs:  Recent Labs  10/08/14 2021 10/09/14 0325 10/10/14 0642  WBC 8.5 7.5 7.5  HGB 10.5* 10.5* 11.2*  PLT 263 260 271  CREATININE 1.40* 1.50* 1.25*   Estimated Creatinine Clearance: 27.2 mL/min (by C-G formula based on Cr of 1.25). No results for input(s): VANCOTROUGH, VANCOPEAK, VANCORANDOM, GENTTROUGH, GENTPEAK, GENTRANDOM, TOBRATROUGH, TOBRAPEAK, TOBRARND, AMIKACINPEAK, AMIKACINTROU, AMIKACIN in the last 72 hours.   Microbiology: Recent Results (from the past 720 hour(s))  Culture, Urine     Status: None   Collection Time: 09/12/14 10:59 PM  Result Value Ref Range Status   Specimen Description URINE, CATHETERIZED  Final   Special Requests NONE  Final   Culture  Setup Time   Final    09/13/2014 05:17 Performed at [facility]   Colony Count   Final    >=100,000 COLONIES/ML Performed at [facility]   Culture YEAST Performed at [facility]  Final   Report Status 09/15/2014 FINAL  Final  Culture, blood (routine x 2)     Status: None   Collection Time: 09/12/14 11:50 PM  Result Value Ref Range Status   Specimen Description BLOOD RIGHT ARM  Final   Special Requests BOTTLES DRAWN AEROBIC ONLY 10CC  Final   Culture  Setup Time   Final    09/13/2014 03:47 Performed at [facility]   Culture   Final    NO GROWTH 5 DAYS Performed at [facility]   Report Status  09/19/2014 FINAL  Final  Culture, blood (routine x 2)     Status: None   Collection Time: 09/12/14 11:59 PM  Result Value Ref Range Status   Specimen Description BLOOD RIGHT HAND  Final   Special Requests BOTTLES DRAWN AEROBIC ONLY 5CC  Final   Culture  Setup Time   Final    09/13/2014 03:47 Performed at [facility]   Culture   Final    NO GROWTH 5 DAYS Performed at [facility]   Report Status 09/19/2014 FINAL  Final  Clostridium Difficile by PCR     Status: None   Collection Time: 09/17/14  2:42 PM  Result Value Ref Range Status   C difficile by pcr NEGATIVE NEGATIVE Final  Clostridium Difficile by PCR     Status: None   Collection Time: 09/22/14  5:30 PM  Result Value Ref Range Status   C difficile by pcr NEGATIVE NEGATIVE Final  Urine culture     Status: None   Collection Time: 10/08/14  8:56 PM  Result Value Ref Range Status   Specimen Description URINE, CATHETERIZED  Final   Special Requests NONE  Final   Culture  Setup Time   Final    10/09/2014 05:03 Performed at [facility] Count   Final    >=100,000 COLONIES/ML Performed at [facility]    Culture   Final    KLEBSIELLA PNEUMONIAE  [facility]    Report Status 10/11/2014 FINAL  Final   Organism ID, Bacteria KLEBSIELLA PNEUMONIAE  Final      Susceptibility   Klebsiella pneumoniae - MIC*    AMPICILLIN >=32 RESISTANT Resistant     CEFAZOLIN >=64 RESISTANT Resistant     CEFTRIAXONE 32 INTERMEDIATE Intermediate     CIPROFLOXACIN <=0.25 SENSITIVE Sensitive     GENTAMICIN >=16 RESISTANT Resistant     LEVOFLOXACIN <=0.12 SENSITIVE Sensitive     NITROFURANTOIN 32 SENSITIVE Sensitive     TOBRAMYCIN 2 SENSITIVE Sensitive     TRIMETH/SULFA <=20 SENSITIVE Sensitive     PIP/TAZO 64 INTERMEDIATE Intermediate     * KLEBSIELLA PNEUMONIAE  MRSA PCR Screening     Status: None   Collection Time: 10/09/14  1:21 AM  Result Value Ref Range Status   MRSA by PCR  NEGATIVE NEGATIVE Final    Comment:        The GeneXpert MRSA Assay (FDA approved for NASAL specimens only), is one component of a comprehensive MRSA colonization surveillance program. It is not intended to diagnose MRSA infection nor to guide or monitor treatment for MRSA infections.     Medical History: Past Medical History  Diagnosis Date  . Diabetes mellitus with diabetic polyneuropathy   . A-fib   . Renal disorder   . Hypertension   . Anemia   . Glaucoma     right eye   . Carotid artery stenosis   . PVD (peripheral vascular disease)     Medications:  Prescriptions prior to admission  Medication Sig Dispense Refill Last Dose  . acetaminophen (TYLENOL) 500 MG tablet Take 1,000 mg by mouth 3 (three) times daily as needed (pain).   unknown at unknown  . allopurinol (ZYLOPRIM) 100 MG tablet Take 100 mg by mouth 2 (two) times daily.    10/08/2014 at Unknown time  . amiodarone (PACERONE) 200 MG tablet Place 1 tablet (200 mg total) into feeding tube daily. 30 tablet 2 10/08/2014 at Unknown time  . atorvastatin (LIPITOR) 80 MG tablet Take 80 mg by mouth daily.   10/07/2014 at Unknown time  . Cholecalciferol (VITAMIN D3) 2000 UNITS TABS Take 1 tablet by mouth daily.   10/08/2014 at Unknown time  . Cyanocobalamin (VITAMIN B-12 PO) Take 1 tablet by mouth daily.   Past Week at Unknown time  . diltiazem (CARDIZEM) 30 MG tablet Take 1 tablet (30 mg total) by mouth every 6 (six) hours. 120 tablet 2 10/08/2014 at Unknown time  . feeding supplement, ENSURE COMPLETE, (ENSURE COMPLETE) LIQD Take 237 mLs by mouth 3 (three) times daily with meals. 15 Bottle 0 unknown at unknown  . ferrous sulfate 325 (65 FE) MG tablet Take 325 mg by mouth daily with breakfast.   10/08/2014 at Unknown time  . furosemide (LASIX) 20 MG tablet Take 20 mg by mouth daily.   10/08/2014 at Unknown time  . metFORMIN (GLUCOPHAGE) 500 MG tablet Take 500 mg by mouth daily with breakfast.    10/08/2014 at Unknown time  .  metoprolol tartrate (LOPRESSOR) 25 mg/10 mL SUSP Place 40 mLs (100 mg total) into feeding tube 2 (two) times daily. 2400 mL 2 10/08/2014 at 0800  . pantoprazole sodium (PROTONIX) 40 mg/20 mL PACK Place 20 mLs (40 mg total) into feeding tube daily. 30 each 2 10/08/2014 at Unknown time  . QUEtiapine (SEROQUEL) 12.5 mg TABS tablet Take 12.5 mg by mouth 2 (two) times daily.   10/08/2014 at Unknown  time  . Travoprost, BAK Free, (TRAVATAN) 0.004 % SOLN ophthalmic solution Place 1 drop into the right eye at bedtime.   10/07/2014 at Unknown time  . atorvastatin (LIPITOR) 20 MG tablet Take 1 tablet (20 mg total) by mouth daily at 6 PM. (Patient not taking: Reported on 10/08/2014) 30 tablet 2   . cholecalciferol (VITAMIN D) 1000 UNITS tablet Take 1,000 Units by mouth every other day.   Past Week at Unknown time  . ciprofloxacin (CIPRO) 250 MG tablet Take 1 tablet (250 mg total) by mouth 2 (two) times daily. (Patient not taking: Reported on 10/08/2014) 6 tablet 0   . cloNIDine (CATAPRES) 0.1 MG tablet Place 1 tablet (0.1 mg total) into feeding tube 2 (two) times daily. (Patient not taking: Reported on 10/08/2014) 60 tablet 11   . Cyanocobalamin (VITAMIN B-12 PO) Take 1 tablet by mouth every other day.   Past Week at Unknown time  . furosemide (LASIX) 20 MG tablet Take 20 mg by mouth 2 (two) times daily.    09/01/2014 at 0000  . Nutritional Supplements (FEEDING SUPPLEMENT, VITAL AF 1.2 CAL,) LIQD Place 270 mLs into feeding tube 4 (four) times daily - after meals and at bedtime. (Patient not taking: Reported on 10/08/2014) 1000 mL prn   . potassium chloride 20 MEQ/15ML (10%) SOLN Place 15 mLs (20 mEq total) into feeding tube daily. (Patient not taking: Reported on 10/08/2014) 450 mL 0   . QUEtiapine (SEROQUEL) 12.5 mg TABS tablet Take 0.5 tablets (12.5 mg total) by mouth 3 (three) times daily. 90 tablet 2   . Water For Irrigation, Sterile (FREE WATER) SOLN Place 300 mLs into feeding tube every 4 (four) hours. 54000  mL prn unknown at unknown   Assessment: Acute encephalopathy and UTI with AMS. Patient was empirically treated with Cefepime for UTI. Pharmacy is consulted to dose meropenem for ESBL UTI. Pt remains afebrile, WBC 7.5, sCr improving to 1.25 with CrCl ~ 2538mL/min.  Goal of Therapy:  Resolution of infection  Plan:  Meropenem 1g IV q12h Monitor renal function, clinical course, and expected LOT  Arlean Hoppingorey M. Newman PiesBall, PharmD Clinical Pharmacist Pager [phone] 10/11/2014,7:38 PM

## 2014-10-11 NOTE — Progress Notes (Signed)
[facility] PROGRESS NOTE  Susan Calhoun BJY:[id number] DOB: 06/16/1928 DOA: 10/08/2014 PCP: Pcp Not In System  Assessment/Plan: 1. Acute encephalopathy -admitted with recent CVA with IVH, hydrocephalous and h/o temporary drain with fluctuating mental status then, [facility] She had multiple EEGs including LTM done showing general slowing and frequent intermittent triphasic waves. She did have generalized tremors during hospital stay with no EEG correlate -multifactorial due to recent CVA, UTI, possible seizures -EEG results noted more c/w encephalopathy -MRI without acute changes -no change from yetserday -on Cefepime for UTI, FU Urine Cx-100k colonies GNR -Neuro following -resumed TFs via G tube, Speech following  2.A flutter, A. Fib. Continue Lopressor, cardizem and amiodarone  3.diabetes mellitus. -SSI for now  4.essential hypertension. -stable continue toprol and cardizem  5. Recent CVA -resume ASA -via Gtube, statin  Code Status: Full Code Family Communication: d/w daughter and granddaughter at bedside on 11/20 Disposition Plan: SNF when stable   Consultants:  Neurology  Procedures:  EEG  Antibiotics:  Cefepime  HPI/Subjective: Sleeping, opens eyes, but not talking  Objective: Filed Vitals:   10/11/14 0433  BP: 155/50  Pulse: 90  Temp: 98.4 F (36.9 C)  Resp: 18    Intake/Output Summary (Last 24 hours) at 10/11/14 1020 Last data filed at 10/10/14 2139  Gross per 24 hour  Intake      3 ml  Output      0 ml  Net      3 ml   Filed Weights   10/09/14 2038 10/10/14 2300 10/11/14 0500  Weight: 63.685 kg (140 lb 6.4 oz) 61.961 kg (136 lb 9.6 oz) 63.685 kg (140 lb 6.4 oz)    Exam:   General:  Sleepy, tracks when called, but not speaking  Cardiovascular: S1S2/RRR  Respiratory: CTAB  Abdomen: soft, NT, BS present  Musculoskeletal: no edema c/c   Neuro: moves all extremities involuntarily  Data Reviewed: Basic  Metabolic Panel:  Recent Labs Lab 10/08/14 2021 10/08/14 2107 10/09/14 0325 10/10/14 0642  NA 138  --  144 140  K >7.7* 4.9 4.4 4.1  CL 102  --  107 102  CO2 22  --  22 23  GLUCOSE 111*  --  177* 122*  BUN 56*  --  50* 32*  CREATININE 1.40*  --  1.50* 1.25*  CALCIUM 8.6  --  8.5 8.8   Liver Function Tests:  Recent Labs Lab 10/08/14 2021 10/09/14 0325  AST 90* 35  ALT 56* 50*  ALKPHOS 78 85  BILITOT 0.2* <0.2*  PROT 7.1 6.6  ALBUMIN 2.4* 2.3*   No results for input(s): LIPASE, AMYLASE in the last 168 hours.  Recent Labs Lab 10/10/14 1915  AMMONIA 14   CBC:  Recent Labs Lab 10/08/14 2021 10/09/14 0325 10/10/14 0642  WBC 8.5 7.5 7.5  NEUTROABS  --  4.8  --   HGB 10.5* 10.5* 11.2*  HCT 34.7* 34.8* 36.6  MCV 96.9 96.7 95.8  PLT 263 260 271   Cardiac Enzymes: No results for input(s): CKTOTAL, CKMB, CKMBINDEX, TROPONINI in the last 168 hours. BNP (last 3 results)  Recent Labs  09/16/14 1730  PROBNP 9594.0*   CBG:  Recent Labs Lab 10/10/14 2122 10/11/14 0002 10/11/14 0424 10/11/14 0626 10/11/14 0743  GLUCAP 135* 143* 231* 187* 150*    Recent Results (from the past 240 hour(s))  Urine culture     Status: None (Preliminary result)   Collection Time: 10/08/14  8:56 PM  Result Value Ref  Range Status   Specimen Description URINE, CATHETERIZED  Final   Special Requests NONE  Final   Culture  Setup Time   Final    10/09/2014 05:03 Performed at [facility]    Colony Count   Final    >=100,000 COLONIES/ML Performed at [facility]    Culture   Final    GRAM NEGATIVE RODS Performed at [facility]    Report Status PENDING  Incomplete  MRSA PCR Screening     Status: None   Collection Time: 10/09/14  1:21 AM  Result Value Ref Range Status   MRSA by PCR NEGATIVE NEGATIVE Final    Comment:        The GeneXpert MRSA Assay (FDA approved for NASAL specimens only), is one component of a comprehensive MRSA  colonization surveillance program. It is not intended to diagnose MRSA infection nor to guide or monitor treatment for MRSA infections.      Studies: Mr Brain Wo Contrast  10/10/2014   CLINICAL DATA:  Encephalopathy. Intraventricular hemorrhage and drain placement last month.  EXAM: MRI HEAD WITHOUT CONTRAST  TECHNIQUE: Multiplanar, multiecho pulse sequences of the brain and surrounding structures were obtained without intravenous contrast.  COMPARISON:  Head CT 09/28/2014 and MRI 09/12/2014  FINDINGS: Images are mildly to moderately degraded by motion artifact.  No acute infarct is identified. There is a small amount of diffusion weighted signal abnormality along the prior right frontal ventriculostomy catheter tract likely secondary to prior intervention. No sizable intracranial hemorrhage is identified, although T2* images are particularly motion degraded. There may be minimal residual chronic blood products in the occipital horns.  The lateral and third ventricles remain moderately dilated but overall slightly improved compared to the prior MRI. Mild T2 hyperintensity in the periventricular white matter bilaterally does not appear significantly changed, nonspecific. No mass, midline shift, or extra-axial fluid collection is identified.  Prior bilateral cataract extraction is noted. There is mild right greater the left sphenoid sinus mucosal thickening. Mastoid air cells are clear. Major intracranial vascular flow voids are preserved.  IMPRESSION: 1. No evidence acute/new intracranial abnormality. 2. Slightly decreased ventricular dilatation since 09/12/2014 MRI.   Electronically Signed   By: Sebastian AcheAllen  Grady   On: 10/10/2014 12:22    Scheduled Meds: . allopurinol  100 mg Per Tube BID  . amiodarone  200 mg Per Tube Daily  . aspirin  325 mg Per Tube Daily  . atorvastatin  80 mg Per Tube q1800  . ceFEPime (MAXIPIME) IV  1 g Intravenous Q24H  . diltiazem  30 mg Oral 4 times per day  . feeding  supplement (VITAL AF 1.2 CAL)  280 mL Per Tube QID  . free water  300 mL Per Tube Q4H  . insulin aspart  0-15 Units Subcutaneous 4 times per day  . latanoprost  1 drop Right Eye QHS  . metoprolol tartrate  100 mg Per Tube BID  . pantoprazole sodium  40 mg Per Tube Daily  . sodium chloride  3 mL Intravenous Q12H   Continuous Infusions:  Antibiotics Given (last 72 hours)    Date/Time Action Medication Dose Rate   10/09/14 0250 Given   ceFEPIme (MAXIPIME) 1 g in dextrose 5 % 50 mL IVPB 1 g 100 mL/hr   10/10/14 0207 Given   ceFEPIme (MAXIPIME) 1 g in dextrose 5 % 50 mL IVPB 1 g 100 mL/hr   10/11/14 0313 Given   ceFEPIme (MAXIPIME) 1 g in dextrose 5 %  50 mL IVPB 1 g 100 mL/hr      Principal Problem:   Acute encephalopathy Active Problems:   Diabetes mellitus with diabetic polyneuropathy   A-fib   Hypertension   Intracerebral hemorrhage   Tremor   UTI (lower urinary tract infection)   Aphasia    Time spent:    [facility]  [facility] Pager [phone]. If 7PM-7AM, please contact night-coverage at [email or web address], password [facility] 10/11/2014, 10:20 AM  LOS: 3 days

## 2014-10-12 LAB — GLUCOSE, CAPILLARY
GLUCOSE-CAPILLARY: 167 mg/dL — AB (ref 70–99)
GLUCOSE-CAPILLARY: 153 mg/dL — AB (ref 70–99)
Glucose-Capillary: 120 mg/dL — ABNORMAL HIGH (ref 70–99)
Glucose-Capillary: 145 mg/dL — ABNORMAL HIGH (ref 70–99)
Glucose-Capillary: 179 mg/dL — ABNORMAL HIGH (ref 70–99)
Glucose-Capillary: 192 mg/dL — ABNORMAL HIGH (ref 70–99)

## 2014-10-12 NOTE — Plan of Care (Signed)
Problem: Phase I Progression Outcomes Goal: Adequate I & O Outcome: Completed/Met Date Met:  10/12/14 Difficulty to assess d/t incontinence. Frequent urination in adequate amounts.

## 2014-10-12 NOTE — Progress Notes (Signed)
Subjective: She continues to be encephalopathic  Exam: Filed Vitals:   10/12/14 1027  BP: 142/90  Pulse: 108  Temp: 97.5 F (36.4 C)  Resp: 18   Gen: In bed, NAD MS: Awake, regards examiner, does not follow commands ZO:XWRUECN:PERRL, EOMI Motor: Moves all extremities  Sensory:responds to nox stim x 4.   UA - shows resistence to cephalosporins.    Impression: 80 yo F with altered mental status. EEG shows frequent triphasic waves which is typically associated with toxic metabolic encephalopathy. I would favor continued treatment of UTI with suspicion of metabolic encephalopathy secondary to UTI. Her persistent encephalopathy would be explained by her resistent organism as she only started treatment that it should be sensitive to yesterday.   Recommendations: 1) continue treatment of UTI 2) I have asked lab to add merrem sensitivities.   Ritta SlotMcNeill Jessi Pitstick, MD [facility] [phone]  If 7pm- 7am, please page neurology on call as listed in AMION.

## 2014-10-12 NOTE — Plan of Care (Signed)
Problem: Phase I Progression Outcomes Goal: Hemodynamically stable Outcome: Completed/Met Date Met:  10/12/14 VSS. Pt afebrile.

## 2014-10-12 NOTE — Progress Notes (Signed)
[facility] PROGRESS NOTE  Susan Calhoun WUJ:[id number] DOB: 08/22/1928 DOA: 10/08/2014 PCP: Pcp Not In System  Assessment/Plan: 1. Acute encephalopathy -admitted with recent CVA with IVH, hydrocephalous and h/o temporary drain with fluctuating mental status then, [facility] She had multiple EEGs including LTM done showing general slowing and frequent intermittent triphasic waves. She did have generalized tremors during hospital stay with no EEG correlate -multifactorial due to recent CVA, UTI, possible seizures -EEG results noted more c/w encephalopathy -MRI without acute changes -remains obtunded without improvement -was on Cefepime for UTI, FU Urine Cx- with Klebsiella, resistant to Cephalosporins, hence changed to Meropenem last pm -Neuro following -resumed TFs via G tube, Speech following  2.A flutter, A. Fib. Continue Lopressor, cardizem and amiodarone  3.diabetes mellitus. -stable, SSI for now  4.essential hypertension. -stable continue toprol and cardizem  5. Recent CVA -resume ASA -via Gtube, statin  Code Status: Full Code Family Communication: d/w daughter and granddaughter at bedside on 11/20, called dtr and grand daughter today 11/22 Disposition Plan: SNF when stable   Consultants:  Neurology  Procedures:  EEG  Antibiotics:  Cefepime till 11/21  Meropenem 11/22  HPI/Subjective: Sleeping, opens eyes, but not talking, obtunded  Objective: Filed Vitals:   10/12/14 1027  BP: 142/90  Pulse: 108  Temp: 97.5 F (36.4 C)  Resp: 18    Intake/Output Summary (Last 24 hours) at 10/12/14 1054 Last data filed at 10/12/14 0700  Gross per 24 hour  Intake   1280 ml  Output      0 ml  Net   1280 ml   Filed Weights   10/11/14 0500 10/11/14 1955 10/12/14 0500  Weight: 63.685 kg (140 lb 6.4 oz) 63.685 kg (140 lb 6.4 oz) 63.685 kg (140 lb 6.4 oz)    Exam:   General:  obtunded, tracks when called, but not speaking  Cardiovascular:  S1S2/RRR  Respiratory: CTAB  Abdomen: soft, NT, BS present  Musculoskeletal: no edema c/c   Neuro: moves all extremities involuntarily  Data Reviewed: Basic Metabolic Panel:  Recent Labs Lab 10/08/14 2021 10/08/14 2107 10/09/14 0325 10/10/14 0642  NA 138  --  144 140  K >7.7* 4.9 4.4 4.1  CL 102  --  107 102  CO2 22  --  22 23  GLUCOSE 111*  --  177* 122*  BUN 56*  --  50* 32*  CREATININE 1.40*  --  1.50* 1.25*  CALCIUM 8.6  --  8.5 8.8   Liver Function Tests:  Recent Labs Lab 10/08/14 2021 10/09/14 0325  AST 90* 35  ALT 56* 50*  ALKPHOS 78 85  BILITOT 0.2* <0.2*  PROT 7.1 6.6  ALBUMIN 2.4* 2.3*   No results for input(s): LIPASE, AMYLASE in the last 168 hours.  Recent Labs Lab 10/10/14 1915  AMMONIA 14   CBC:  Recent Labs Lab 10/08/14 2021 10/09/14 0325 10/10/14 0642  WBC 8.5 7.5 7.5  NEUTROABS  --  4.8  --   HGB 10.5* 10.5* 11.2*  HCT 34.7* 34.8* 36.6  MCV 96.9 96.7 95.8  PLT 263 260 271   Cardiac Enzymes: No results for input(s): CKTOTAL, CKMB, CKMBINDEX, TROPONINI in the last 168 hours. BNP (last 3 results)  Recent Labs  09/16/14 1730  PROBNP 9594.0*   CBG:  Recent Labs Lab 10/11/14 1701 10/11/14 1954 10/11/14 2356 10/12/14 0550 10/12/14 0707  GLUCAP 151* 119* 196* 179* 167*    Recent Results (from the past 240 hour(s))  Urine culture  Status: None   Collection Time: 10/08/14  8:56 PM  Result Value Ref Range Status   Specimen Description URINE, CATHETERIZED  Final   Special Requests NONE  Final   Culture  Setup Time   Final    10/09/2014 05:03 Performed at [facility]    Colony Count   Final    >=100,000 COLONIES/ML Performed at [facility]    Culture   Final    KLEBSIELLA PNEUMONIAE Performed at [facility]    Report Status 10/11/2014 FINAL  Final   Organism ID, Bacteria KLEBSIELLA PNEUMONIAE  Final      Susceptibility   Klebsiella pneumoniae - MIC*    AMPICILLIN >=32 RESISTANT  Resistant     CEFAZOLIN >=64 RESISTANT Resistant     CEFTRIAXONE 32 INTERMEDIATE Intermediate     CIPROFLOXACIN <=0.25 SENSITIVE Sensitive     GENTAMICIN >=16 RESISTANT Resistant     LEVOFLOXACIN <=0.12 SENSITIVE Sensitive     NITROFURANTOIN 32 SENSITIVE Sensitive     TOBRAMYCIN 2 SENSITIVE Sensitive     TRIMETH/SULFA <=20 SENSITIVE Sensitive     PIP/TAZO 64 INTERMEDIATE Intermediate     * KLEBSIELLA PNEUMONIAE  MRSA PCR Screening     Status: None   Collection Time: 10/09/14  1:21 AM  Result Value Ref Range Status   MRSA by PCR NEGATIVE NEGATIVE Final    Comment:        The GeneXpert MRSA Assay (FDA approved for NASAL specimens only), is one component of a comprehensive MRSA colonization surveillance program. It is not intended to diagnose MRSA infection nor to guide or monitor treatment for MRSA infections.      Studies: Mr Brain Wo Contrast  10/10/2014   CLINICAL DATA:  Encephalopathy. Intraventricular hemorrhage and drain placement last month.  EXAM: MRI HEAD WITHOUT CONTRAST  TECHNIQUE: Multiplanar, multiecho pulse sequences of the brain and surrounding structures were obtained without intravenous contrast.  COMPARISON:  Head CT 09/28/2014 and MRI 09/12/2014  FINDINGS: Images are mildly to moderately degraded by motion artifact.  No acute infarct is identified. There is a small amount of diffusion weighted signal abnormality along the prior right frontal ventriculostomy catheter tract likely secondary to prior intervention. No sizable intracranial hemorrhage is identified, although T2* images are particularly motion degraded. There may be minimal residual chronic blood products in the occipital horns.  The lateral and third ventricles remain moderately dilated but overall slightly improved compared to the prior MRI. Mild T2 hyperintensity in the periventricular white matter bilaterally does not appear significantly changed, nonspecific. No mass, midline shift, or extra-axial fluid  collection is identified.  Prior bilateral cataract extraction is noted. There is mild right greater the left sphenoid sinus mucosal thickening. Mastoid air cells are clear. Major intracranial vascular flow voids are preserved.  IMPRESSION: 1. No evidence acute/new intracranial abnormality. 2. Slightly decreased ventricular dilatation since 09/12/2014 MRI.   Electronically Signed   By: Sebastian AcheAllen  Grady   On: 10/10/2014 12:22    Scheduled Meds: . allopurinol  100 mg Per Tube BID  . amiodarone  200 mg Per Tube Daily  . aspirin  325 mg Per Tube Daily  . atorvastatin  80 mg Per Tube q1800  . diltiazem  30 mg Oral 4 times per day  . feeding supplement (VITAL AF 1.2 CAL)  280 mL Per Tube QID  . free water  300 mL Per Tube Q4H  . insulin aspart  0-15 Units Subcutaneous 4 times per day  . latanoprost  1 drop Right Eye QHS  . meropenem (MERREM) IV  1 g Intravenous Q12H  . metoprolol tartrate  100 mg Per Tube BID  . pantoprazole sodium  40 mg Per Tube Daily  . sodium chloride  3 mL Intravenous Q12H   Continuous Infusions:  Antibiotics Given (last 72 hours)    Date/Time Action Medication Dose Rate   10/10/14 0207 Given   ceFEPIme (MAXIPIME) 1 g in dextrose 5 % 50 mL IVPB 1 g 100 mL/hr   10/11/14 0981 Given   ceFEPIme (MAXIPIME) 1 g in dextrose 5 % 50 mL IVPB 1 g 100 mL/hr   10/11/14 2312 Given   meropenem (MERREM) 1 g in sodium chloride 0.9 % 100 mL IVPB 1 g 200 mL/hr      Principal Problem:   Acute encephalopathy Active Problems:   Diabetes mellitus with diabetic polyneuropathy   A-fib   Hypertension   Intracerebral hemorrhage   Tremor   UTI (lower urinary tract infection)   Aphasia    Time spent:    [facility]  [facility] Pager [phone]. If 7PM-7AM, please contact night-coverage at [email or web address], password [facility] 10/12/2014, 10:54 AM  LOS: 4 days

## 2014-10-12 NOTE — Plan of Care (Signed)
Problem: Phase I Progression Outcomes Goal: Tolerating diet Outcome: Completed/Met Date Met:  10/12/14 Tolerating tube feedings. No residual.

## 2014-10-13 ENCOUNTER — Inpatient Hospital Stay (HOSPITAL_COMMUNITY): Payer: Medicare (Managed Care)

## 2014-10-13 DIAGNOSIS — I618 Other nontraumatic intracerebral hemorrhage: Secondary | ICD-10-CM

## 2014-10-13 LAB — URINE CULTURE: Colony Count: >=100000

## 2014-10-13 LAB — BASIC METABOLIC PANEL
Anion gap: 16 — ABNORMAL HIGH (ref 5–15)
BUN: 33 mg/dL — ABNORMAL HIGH (ref 6–23)
CHLORIDE: 101 meq/L (ref 96–112)
CO2: 22 meq/L (ref 19–32)
CREATININE: 1.02 mg/dL (ref 0.50–1.10)
Calcium: 8.6 mg/dL (ref 8.4–10.5)
GFR calc Af Amer: 57 mL/min — ABNORMAL LOW (ref >90)
GFR calc non Af Amer: 49 mL/min — ABNORMAL LOW (ref >90)
GLUCOSE: 171 mg/dL — AB (ref 70–99)
POTASSIUM: 4.1 meq/L (ref 3.7–5.3)
Sodium: 139 mEq/L (ref 137–147)

## 2014-10-13 LAB — GLUCOSE, CAPILLARY
GLUCOSE-CAPILLARY: 155 mg/dL — AB (ref 70–99)
Glucose-Capillary: 190 mg/dL — ABNORMAL HIGH (ref 70–99)
Glucose-Capillary: 173 mg/dL — ABNORMAL HIGH (ref 70–99)
Glucose-Capillary: 143 mg/dL — ABNORMAL HIGH (ref 70–99)
Glucose-Capillary: 171 mg/dL — ABNORMAL HIGH (ref 70–99)

## 2014-10-13 MED ORDER — CETYLPYRIDINIUM CHLORIDE 0.05 % MT LIQD
7.0000 mL | Freq: Two times a day (BID) | OROMUCOSAL | Status: DC
  Administered 2014-10-13 – 2014-10-21 (×16): 7 mL via OROMUCOSAL

## 2014-10-13 MED ORDER — INSULIN ASPART 100 UNIT/ML ~~LOC~~ SOLN
0.0000 [IU] | SUBCUTANEOUS | Status: DC
  Administered 2014-10-13: 2 [IU] via SUBCUTANEOUS
  Administered 2014-10-13 (×2): 3 [IU] via SUBCUTANEOUS
  Administered 2014-10-14 (×2): 2 [IU] via SUBCUTANEOUS
  Administered 2014-10-14: 3 [IU] via SUBCUTANEOUS
  Administered 2014-10-15: 2 [IU] via SUBCUTANEOUS
  Administered 2014-10-15: 5 [IU] via SUBCUTANEOUS
  Administered 2014-10-15 – 2014-10-16 (×2): 2 [IU] via SUBCUTANEOUS
  Administered 2014-10-16: 3 [IU] via SUBCUTANEOUS
  Administered 2014-10-16: 2 [IU] via SUBCUTANEOUS
  Administered 2014-10-17 (×2): 3 [IU] via SUBCUTANEOUS
  Administered 2014-10-17 – 2014-10-18 (×3): 2 [IU] via SUBCUTANEOUS
  Administered 2014-10-18: 3 [IU] via SUBCUTANEOUS
  Administered 2014-10-19 (×2): 2 [IU] via SUBCUTANEOUS
  Administered 2014-10-19: 3 [IU] via SUBCUTANEOUS
  Administered 2014-10-20: 2 [IU] via SUBCUTANEOUS

## 2014-10-13 MED ORDER — CHLORHEXIDINE GLUCONATE 0.12 % MT SOLN
15.0000 mL | Freq: Two times a day (BID) | OROMUCOSAL | Status: DC
  Administered 2014-10-13 – 2014-10-21 (×16): 15 mL via OROMUCOSAL
  Filled 2014-10-13 (×20): qty 15

## 2014-10-13 MED ORDER — ENOXAPARIN SODIUM 30 MG/0.3ML ~~LOC~~ SOLN
30.0000 mg | SUBCUTANEOUS | Status: DC
  Administered 2014-10-13 – 2014-10-16 (×4): 30 mg via SUBCUTANEOUS
  Filled 2014-10-13 (×5): qty 0.3

## 2014-10-13 NOTE — Progress Notes (Signed)
EEG running  For the afternoon. No skin breakdown with hookup/ event button tested/ nurse and tech educated regarding event button

## 2014-10-13 NOTE — Progress Notes (Signed)
[facility] PROGRESS NOTE  Susan Calhoun RUE:[id number] DOB: 01/04/1928 DOA: 10/08/2014 PCP: Pcp Not In System  Assessment/Plan: 1. Acute encephalopathy -admitted with recent CVA with IVH, hydrocephalous and h/o temporary drain with fluctuating mental status then[facility] She had multiple EEGs including LTM done showing general slowing and frequent intermittent triphasic waves. She did have generalized tremors during hospital stay with no EEG correlate -multifactorial due to recent CVA, UTI, possible seizures -EEG with triphasic waves more c/w encephalopathy -MRI without acute changes -was on Cefepime for UTI, FU Urine Cx- with Klebsiella, resistant to Cephalosporins, hence changed to Meropenem 11/21 -unfortunately continues to be obtunded without any improvement -Neuro following, does she need a prolong EEG, will d/w Neuro regarding Palliative eval too -resumed TFs via G tube, Speech following  2.A flutter, A. Fib. Continue Lopressor, cardizem and amiodarone via PEG  3.Diabetes mellitus. -stable, SSI for now  4.Essential hypertension. -stable continue toprol and cardizem  5. Recent CVA -resume ASA -via Gtube, statin  6. Diarrhea/soft stools -likely due to tube feeds -r/o Cdiff for completeness  DVt proph: lovenox  Code Status: Full Code Family Communication: d/w daughter and granddaughter at bedside on 11/20, called dtr and grand daughter 11/22 Disposition Plan: SNF when stable   Consultants:  Neurology  Procedures:  EEG  Antibiotics:  Cefepime till 11/21  Meropenem 11/22  HPI/Subjective: No changes opens eyes, but not talking, obtunded  Objective: Filed Vitals:   10/13/14 0900  BP: 170/75  Pulse: 72  Temp: 98.1 F (36.7 C)  Resp: 16    Intake/Output Summary (Last 24 hours) at 10/13/14 1059 Last data filed at 10/13/14 1046  Gross per 24 hour  Intake   3490 ml  Output      0 ml  Net   3490 ml   Filed Weights   10/12/14  0500 10/12/14 2008 10/13/14 0416  Weight: 63.685 kg (140 lb 6.4 oz) 63.685 kg (140 lb 6.4 oz) 63.685 kg (140 lb 6.4 oz)    Exam:   General:  obtunded, tracks when called, but not speaking  Cardiovascular: S1S2/RRR  Respiratory: CTAB  Abdomen: soft, NT, BS present  Musculoskeletal: no edema c/c   Neuro: moves all extremities involuntarily  Data Reviewed: Basic Metabolic Panel:  Recent Labs Lab 10/08/14 2021 10/08/14 2107 10/09/14 0325 10/10/14 0642 10/13/14 0515  NA 138  --  144 140 139  K >7.7* 4.9 4.4 4.1 4.1  CL 102  --  107 102 101  CO2 22  --  22 23 22   GLUCOSE 111*  --  177* 122* 171*  BUN 56*  --  50* 32* 33*  CREATININE 1.40*  --  1.50* 1.25* 1.02  CALCIUM 8.6  --  8.5 8.8 8.6   Liver Function Tests:  Recent Labs Lab 10/08/14 2021 10/09/14 0325  AST 90* 35  ALT 56* 50*  ALKPHOS 78 85  BILITOT 0.2* <0.2*  PROT 7.1 6.6  ALBUMIN 2.4* 2.3*   No results for input(s): LIPASE, AMYLASE in the last 168 hours.  Recent Labs Lab 10/10/14 1915  AMMONIA 14   CBC:  Recent Labs Lab 10/08/14 2021 10/09/14 0325 10/10/14 0642  WBC 8.5 7.5 7.5  NEUTROABS  --  4.8  --   HGB 10.5* 10.5* 11.2*  HCT 34.7* 34.8* 36.6  MCV 96.9 96.7 95.8  PLT 263 260 271   Cardiac Enzymes: No results for input(s): CKTOTAL, CKMB, CKMBINDEX, TROPONINI in the last 168 hours. BNP (last 3 results)  Recent  Labs  09/16/14 1730  PROBNP 9594.0*   CBG:  Recent Labs Lab 10/12/14 1717 10/12/14 2004 10/12/14 2356 10/13/14 0550 10/13/14 0746  GLUCAP 120* 153* 192* 190* 173*    Recent Results (from the past 240 hour(s))  Urine culture     Status: None   Collection Time: 10/08/14  8:56 PM  Result Value Ref Range Status   Specimen Description URINE, CATHETERIZED  Final   Special Requests NONE  Final   Culture  Setup Time 10/09/2014 05:03  Final   Colony Count >=100,000 COLONIES/ML  Final   Culture KLEBSIELLA PNEUMONIAE  Final   Report Status 10/11/2014 FINAL  Final    Organism ID, Bacteria KLEBSIELLA PNEUMONIAE  Final      Susceptibility   Klebsiella pneumoniae - MIC*    AMPICILLIN >=32 RESISTANT Resistant     CEFAZOLIN >=64 RESISTANT Resistant     CEFTRIAXONE 32 INTERMEDIATE Intermediate     CIPROFLOXACIN <=0.25 SENSITIVE Sensitive     GENTAMICIN >=16 RESISTANT Resistant     LEVOFLOXACIN <=0.12 SENSITIVE Sensitive     NITROFURANTOIN 32 SENSITIVE Sensitive     TOBRAMYCIN 2 SENSITIVE Sensitive     TRIMETH/SULFA <=20 SENSITIVE Sensitive     PIP/TAZO Value in next row Intermediate      64 INTERMED[facility]ATEPerformed at [facility]    * KLEBSIELLA PNEUMONIAE  MRSA PCR Screening     Status: None   Collection Time: 10/09/14  1:21 AM  Result Value Ref Range Status   MRSA by PCR NEGATIVE NEGATIVE Final    Comment:        The GeneXpert MRSA Assay (FDA approved for NASAL specimens only), is one component of a comprehensive MRSA colonization surveillance program. It is not intended to diagnose MRSA infection nor to guide or monitor treatment for MRSA infections.      Studies: No results found.  Scheduled Meds: . allopurinol  100 mg Per Tube BID  . amiodarone  200 mg Per Tube Daily  . aspirin  325 mg Per Tube Daily  . atorvastatin  80 mg Per Tube q1800  . diltiazem  30 mg Oral 4 times per day  . feeding supplement (VITAL AF 1.2 CAL)  280 mL Per Tube QID  . free water  300 mL Per Tube Q4H  . insulin aspart  0-15 Units Subcutaneous 6 times per day  . latanoprost  1 drop Right Eye QHS  . meropenem (MERREM) IV  1 g Intravenous Q12H  . metoprolol tartrate  100 mg Per Tube BID  . pantoprazole sodium  40 mg Per Tube Daily  . sodium chloride  3 mL Intravenous Q12H   Continuous Infusions:  Antibiotics Given (last 72 hours)    Date/Time Action Medication Dose Rate   10/11/14 0313 Given   ceFEPIme (MAXIPIME) 1 g in dextrose 5 % 50 mL IVPB 1 g 100 mL/hr   10/11/14 2312 Given   meropenem (MERREM) 1 g in sodium chloride 0.9 % 100 mL IVPB 1 g 200  mL/hr   10/12/14 1101 Given   meropenem (MERREM) 1 g in sodium chloride 0.9 % 100 mL IVPB 1 g 200 mL/hr   10/12/14 2015 Given   meropenem (MERREM) 1 g in sodium chloride 0.9 % 100 mL IVPB 1 g 200 mL/hr   10/13/14 1046 Given  [med not available at scheduled time]   meropenem (MERREM) 1 g in sodium chloride 0.9 % 100 mL IVPB 1 g 200 mL/hr      Principal  Problem:   Acute encephalopathy Active Problems:   Diabetes mellitus with diabetic polyneuropathy   A-fib   Hypertension   Intracerebral hemorrhage   Tremor   UTI (lower urinary tract infection)   Aphasia    Time spent:    [facility]  [facility] Pager [phone]. If 7PM-7AM, please contact night-coverage at [email or web address], password [facility] 10/13/2014, 10:59 AM  LOS: 5 days

## 2014-10-13 NOTE — Progress Notes (Signed)
Occupational Therapy Treatment Patient Details Name: Susan Calhoun MRN: [id number] DOB: 08/23/1928 Today's Date: 10/13/2014    History of present illness 80 y.o. female with Past medical history of diabetes mellitus, chronic kidney disease, hypertension, peripheral vascular disease, recent CVA with intraventricular hemorrhage status post drain placement and removal. She has a PEG from previous admission. Pt. found to have UTI. CXR No active disease.   OT comments  Pt. In deep sleep, difficult to sustain arousal for participation in skilled OT.  At this time remains total A for grooming tasks.  Will continue to follow acutely for increasing participation with adls.  Follow Up Recommendations  SNF;Supervision/Assistance - 24 hour                 Precautions / Restrictions Precautions Precautions: Fall Precaution Comments: ventricular drain                                             ADL       Grooming: Wash/dry face;Total assistance                                 General ADL Comments: decreased arousal, mutlitple attempts to engage pt. in grooming task.  pt. total a with attempts at hand over hand assistance for continued encouragement.  pt. not able to remain awake for skilled tx.                                                                                              General Comments      Pertinent Vitals/ Pain       Pain Assessment:  (no grimacing noted, but unable to rate)                                                          Frequency Min 2X/week     Progress Toward Goals  OT Goals(current goals can now be found in the care plan section)  Progress towards OT goals: Not progressing toward goals - comment (limited participation secondary to lethargy)     Plan Discharge plan remains appropriate                     End of Session      Activity Tolerance Patient limited by lethargy   Patient Left in bed;with call bell/phone within reach   Nurse Communication Other (comment) (discussed limited arousal with RN)        Time: 4098-1191: 0810-0820 OT Time Calculation (min): 10 min  Charges: OT General Charges $OT Visit: 1 Procedure OT Treatments $Self Care/Home Management : 8-22 mins  Robet LeuMorris, Cora Stetson Lorraine, COTA/L 10/13/2014, 8:43 AM

## 2014-10-13 NOTE — Progress Notes (Signed)
SLP Cancellation Note  Patient Details Name: Susan Calhoun MRN: [id number] DOB: 12/26/1927   Cancelled treatment:       Reason Eval/Treat Not Completed: Spoke with RN, who reports that patient is connected to EEG at this time. Pt is not following commands, and would not volitionally open her mouth to allow for swabs for oral care today. SLP will continue to assess readiness for additional PO trials. Until that time, patient remains nutritionally supported via g-tube.    Maxcine HamLaura Paiewonsky, M.A. CCC-SLP [phone]  Maxcine Hamaiewonsky, Nakota Elsen 10/13/2014, 4:29 PM

## 2014-10-13 NOTE — Progress Notes (Signed)
NEURO HOSPITALIST PROGRESS NOTE   SUBJECTIVE:                                                                                                                        Mrs. Charm BargesButler open her eyes intermittently but doesn't follow commands.  Family at the bedside wondering " why this is happening, she was doing ok after she was discharged from the hospital until she had the UTI". Serologies are unimpressive.  OBJECTIVE:                                                                                                                           Vital signs in last 24 hours: Temp:  [97.3 F (36.3 C)-98.1 F (36.7 C)] 98.1 F (36.7 C) (11/23 0900) Pulse Rate:  [64-78] 72 (11/23 0900) Resp:  [16] 16 (11/23 0900) BP: (124-170)/(44-75) 170/75 mmHg (11/23 0900) SpO2:  [96 %-99 %] 98 % (11/23 0900) Weight:  [63.685 kg (140 lb 6.4 oz)] 63.685 kg (140 lb 6.4 oz) (11/23 0416)  Intake/Output from previous day: 11/22 0701 - 11/23 0700 In: 2810 [NG/GT:870; IV Piggyback:200] Out: -  Intake/Output this shift: Total I/O In: 680 [NG/GT:580; IV Piggyback:100] Out: -  Nutritional status: Diet NPO time specified  Past Medical History  Diagnosis Date  . Diabetes mellitus with diabetic polyneuropathy   . A-fib   . Renal disorder   . Hypertension   . Anemia   . Glaucoma     right eye   . Carotid artery stenosis   . PVD (peripheral vascular disease)     Physical exam: pleasant, no apparent distress. Head: normocephalic. Neck: supple, no bruits, no JVD. Cardiac: no murmurs. Lungs: clear. Abdomen: soft, no tender, no mass. Extremities: no edema.   Neurologic Exam:  MS: open eyes intermittently, does not follow commands CN: resists eye exam.  Motor: Moves all extremities  Sensory:responds to nox stim x 4.   Lab Results: Lab Results  Component Value Date/Time   CHOL 204* 09/02/2014 05:30 AM   Lipid Panel No results for input(s): CHOL, TRIG, HDL,  CHOLHDL, VLDL, LDLCALC in the last 72 hours.  Studies/Results: No results found.  MEDICATIONS  Scheduled: . allopurinol  100 mg Per Tube BID  . amiodarone  200 mg Per Tube Daily  . aspirin  325 mg Per Tube Daily  . atorvastatin  80 mg Per Tube q1800  . diltiazem  30 mg Oral 4 times per day  . enoxaparin (LOVENOX) injection  30 mg Subcutaneous Q24H  . feeding supplement (VITAL AF 1.2 CAL)  280 mL Per Tube QID  . free water  300 mL Per Tube Q4H  . insulin aspart  0-15 Units Subcutaneous 6 times per day  . latanoprost  1 drop Right Eye QHS  . meropenem (MERREM) IV  1 g Intravenous Q12H  . metoprolol tartrate  100 mg Per Tube BID  . pantoprazole sodium  40 mg Per Tube Daily  . sodium chloride  3 mL Intravenous Q12H    ASSESSMENT/PLAN:                                                                                                           80 y/o with recent non-dominant IVH secondary to coumadin associated coagulopathy in setting of uncontrolled hypertension, re admitted with AMS. Has UTI resistant to cephalosporins and now on meropenem.  Remains lethargic. Although ongoing encephalopathy could be related to UTI in a patient with poor brain reserve, also agree with pursuing prolonged EEG to exclude NCSE. Will follow up.  Wyatt Portelasvaldo Athene Schuhmacher, MD [facility] [phone]  10/13/2014, 11:46 AM

## 2014-10-13 NOTE — Progress Notes (Signed)
EEG completed. > 1hr. No skin breakdown

## 2014-10-14 DIAGNOSIS — E1342 Other specified diabetes mellitus with diabetic polyneuropathy: Secondary | ICD-10-CM

## 2014-10-14 LAB — CBC
HCT: 33.6 % — ABNORMAL LOW (ref 36.0–46.0)
HEMOGLOBIN: 10.5 g/dL — AB (ref 12.0–15.0)
MCH: 29.4 pg (ref 26.0–34.0)
MCHC: 31.3 g/dL (ref 30.0–36.0)
MCV: 94.1 fL (ref 78.0–100.0)
Platelets: 255 10*3/uL (ref 150–400)
RBC: 3.57 MIL/uL — AB (ref 3.87–5.11)
RDW: 16.7 % — ABNORMAL HIGH (ref 11.5–15.5)
WBC: 12.5 10*3/uL — AB (ref 4.0–10.5)

## 2014-10-14 LAB — BASIC METABOLIC PANEL
Anion gap: 16 — ABNORMAL HIGH (ref 5–15)
BUN: 36 mg/dL — ABNORMAL HIGH (ref 6–23)
CALCIUM: 8.5 mg/dL (ref 8.4–10.5)
CHLORIDE: 101 meq/L (ref 96–112)
CO2: 20 meq/L (ref 19–32)
Creatinine, Ser: 0.96 mg/dL (ref 0.50–1.10)
GFR calc Af Amer: 61 mL/min — ABNORMAL LOW (ref >90)
GFR calc non Af Amer: 53 mL/min — ABNORMAL LOW (ref >90)
GLUCOSE: 144 mg/dL — AB (ref 70–99)
Potassium: 4.2 mEq/L (ref 3.7–5.3)
SODIUM: 137 meq/L (ref 137–147)

## 2014-10-14 LAB — GLUCOSE, CAPILLARY
GLUCOSE-CAPILLARY: 130 mg/dL — AB (ref 70–99)
GLUCOSE-CAPILLARY: 138 mg/dL — AB (ref 70–99)
GLUCOSE-CAPILLARY: 165 mg/dL — AB (ref 70–99)
Glucose-Capillary: 145 mg/dL — ABNORMAL HIGH (ref 70–99)
Glucose-Capillary: 134 mg/dL — ABNORMAL HIGH (ref 70–99)
Glucose-Capillary: 171 mg/dL — ABNORMAL HIGH (ref 70–99)

## 2014-10-14 NOTE — Progress Notes (Signed)
Meropenem per pharmacy  Infectious Disease: Meropenem for ESBL UTI. WBC 12.5 K; Tmx 98.6 UA - turbid, moderate leukocytes  11/18 Urine (+)Kleb. S: Cipro, lvq, tobra, septra  11/19 cefepime >> 11/21 11/21 Meropenem >>  11/08 ucx>> K. pneumo (S to cipro, levo, bactrim)   Nephrology: scr 0.96, lytes ok.   Plan:  1) Cont meropenem 1gm IV q12h for now 2) f/u plan on antibiotic

## 2014-10-14 NOTE — Clinical Social Work Note (Signed)
CSW continuing to monitor patient's progress and patient not yet medically stable to [facility]. CSW will continue to follow and assist with discharge or any SW services as needed.  Genelle BalVanessa Reyhan Moronta, MSW, LCSW [phone]

## 2014-10-14 NOTE — Progress Notes (Addendum)
[facility] PROGRESS NOTE  Susan Calhoun JYN:[id number] DOB: 10/31/1928 DOA: 10/08/2014 PCP: Pcp Not In System   Brief Narrative: 86/F with complicated recent admission, Admitted with IVH suspected to be secondary to coumadin associated coagulopathy in setting of uncontrolled HTN. She had associated hydrocephalus and a drain was temporarily placed. Noted to have profound fluctuating encephalopathy which was though to be secondary to IVH and hydrocephalus. She had multiple EEGs including LTM done showing general slowing and frequent intermittent triphasic waves. She did have generalized tremors during hospital stay with no EEG correlate, her coumadin was discontinued, she was discharged on 11/10 to SNF. Readmitted 11/19 with with worsening encephalopathy, NEurology following EEG c/ encephalopathy, MRI without acute changes UTI with ESBL Klebsiella, on Meropenem, mentation without improvement Palliative consulted for goals of care   Assessment/Plan: 1. Acute encephalopathy -admitted with recent CVA with IVH, hydrocephalous and h/o temporary drain with fluctuating mental status then, [facility] She had multiple EEGs including LTM done showing general slowing and frequent intermittent triphasic waves. She did have generalized tremors during hospital stay with no EEG correlate -multifactorial due to recent CVA, UTI, possible seizures -EEG with triphasic waves more c/w encephalopathy -MRI without acute changes -was on Cefepime for UTI, FU Urine Cx- with Klebsiella, resistant to Cephalosporins, hence changed to Meropenem 11/21 -unfortunately continues to be obtunded without any improvement -I called daughter and recommended Palliative eval, she is agreeable to this -Neuro following, had repeat EEG without seizure activity -resumed TFs via G tube, Speech following  2. A flutter, A. Fib. Continue Lopressor, cardizem and amiodarone via PEG  3.Diabetes mellitus. -stable, SSI for  now  4.Essential hypertension. -stable continue toprol and cardizem  5. Recent CVA -resume ASA -via Gtube, statin  6. Diarrhea/soft stools -likely due to tube feeds -r/o Cdiff for completeness  DVt proph: lovenox  Code Status: DNR from today after rediscussion with daughter Family Communication: d/w daughter and granddaughter at bedside on 11/20, called dtr and grand daughter 11/22, i called and re discussed need to re-evaluate Code status and recommended DNR and Palliative consult,   Disposition Plan: SNF when stable   Consultants:  Neurology  Procedures:  EEG  Antibiotics:  Cefepime till 11/21  Meropenem 11/22  HPI/Subjective: No changes opens eyes, but not talking, obtunded  Objective: Filed Vitals:   10/14/14 1057  BP: 157/64  Pulse: 90  Temp: 99.1 F (37.3 C)  Resp: 21    Intake/Output Summary (Last 24 hours) at 10/14/14 1400 Last data filed at 10/14/14 0900  Gross per 24 hour  Intake   1280 ml  Output      0 ml  Net   1280 ml   Filed Weights   10/12/14 2008 10/13/14 0416 10/13/14 2046  Weight: 63.685 kg (140 lb 6.4 oz) 63.685 kg (140 lb 6.4 oz) 65.454 kg (144 lb 4.8 oz)    Exam:   General:  obtunded, tracks a little when called, but mostly unreponsive except to painful stimuli  Cardiovascular: S1S2/RRR  Respiratory: CTAB  Abdomen: soft, NT, BS present, PEg in situ  Musculoskeletal: no edema c/c   Neuro: moves all extremities involuntarily  Data Reviewed: Basic Metabolic Panel:  Recent Labs Lab 10/08/14 2021 10/08/14 2107 10/09/14 0325 10/10/14 0642 10/13/14 0515 10/14/14 0557  NA 138  --  144 140 139 137  K >7.7* 4.9 4.4 4.1 4.1 4.2  CL 102  --  107 102 101 101  CO2 22  --  22 23 22  20  GLUCOSE 111*  --  177* 122* 171* 144*  BUN 56*  --  50* 32* 33* 36*  CREATININE 1.40*  --  1.50* 1.25* 1.02 0.96  CALCIUM 8.6  --  8.5 8.8 8.6 8.5   Liver Function Tests:  Recent Labs Lab 10/08/14 2021 10/09/14 0325  AST 90* 35   ALT 56* 50*  ALKPHOS 78 85  BILITOT 0.2* <0.2*  PROT 7.1 6.6  ALBUMIN 2.4* 2.3*   No results for input(s): LIPASE, AMYLASE in the last 168 hours.  Recent Labs Lab 10/10/14 1915  AMMONIA 14   CBC:  Recent Labs Lab 10/08/14 2021 10/09/14 0325 10/10/14 0642 10/14/14 0557  WBC 8.5 7.5 7.5 12.5*  NEUTROABS  --  4.8  --   --   HGB 10.5* 10.5* 11.2* 10.5*  HCT 34.7* 34.8* 36.6 33.6*  MCV 96.9 96.7 95.8 94.1  PLT 263 260 271 255   Cardiac Enzymes: No results for input(s): CKTOTAL, CKMB, CKMBINDEX, TROPONINI in the last 168 hours. BNP (last 3 results)  Recent Labs  09/16/14 1730  PROBNP 9594.0*   CBG:  Recent Labs Lab 10/13/14 2043 10/14/14 0020 10/14/14 0500 10/14/14 0751 10/14/14 1219  GLUCAP 171* 165* 145* 134* 130*    Recent Results (from the past 240 hour(s))  Urine culture     Status: None   Collection Time: 10/08/14  8:56 PM  Result Value Ref Range Status   Specimen Description URINE, CATHETERIZED  Final   Special Requests NONE  Final   Culture  Setup Time   Final    10/09/2014 05:03 Performed at [facility]    Colony Count   Final    >=100,000 COLONIES/ML Performed at [facility]    Culture   Final    KLEBSIELLA PNEUMONIAE Note: MEROPENEM SENSITIVE <=0.25 Performed at [facility]    Report Status 10/13/2014 FINAL  Final   Organism ID, Bacteria KLEBSIELLA PNEUMONIAE  Final      Susceptibility   Klebsiella pneumoniae - MIC*    AMPICILLIN >=32 RESISTANT Resistant     CEFAZOLIN >=64 RESISTANT Resistant     CEFTRIAXONE 32 INTERMEDIATE Intermediate     CIPROFLOXACIN <=0.25 SENSITIVE Sensitive     GENTAMICIN >=16 RESISTANT Resistant     LEVOFLOXACIN <=0.12 SENSITIVE Sensitive     NITROFURANTOIN 32 SENSITIVE Sensitive     TOBRAMYCIN 2 SENSITIVE Sensitive     TRIMETH/SULFA <=20 SENSITIVE Sensitive     PIP/TAZO 64 INTERMEDIATE Intermediate     * KLEBSIELLA PNEUMONIAE  MRSA PCR Screening     Status: None   Collection  Time: 10/09/14  1:21 AM  Result Value Ref Range Status   MRSA by PCR NEGATIVE NEGATIVE Final    Comment:        The GeneXpert MRSA Assay (FDA approved for NASAL specimens only), is one component of a comprehensive MRSA colonization surveillance program. It is not intended to diagnose MRSA infection nor to guide or monitor treatment for MRSA infections.      Studies: No results found.  Scheduled Meds: . allopurinol  100 mg Per Tube BID  . amiodarone  200 mg Per Tube Daily  . antiseptic oral rinse  7 mL Mouth Rinse q12n4p  . aspirin  325 mg Per Tube Daily  . atorvastatin  80 mg Per Tube q1800  . chlorhexidine  15 mL Mouth Rinse BID  . diltiazem  30 mg Oral 4 times per day  . enoxaparin (LOVENOX) injection  30 mg Subcutaneous  Q24H  . feeding supplement (VITAL AF 1.2 CAL)  280 mL Per Tube QID  . free water  300 mL Per Tube Q4H  . insulin aspart  0-15 Units Subcutaneous 6 times per day  . latanoprost  1 drop Right Eye QHS  . meropenem (MERREM) IV  1 g Intravenous Q12H  . metoprolol tartrate  100 mg Per Tube BID  . pantoprazole sodium  40 mg Per Tube Daily  . sodium chloride  3 mL Intravenous Q12H   Continuous Infusions:  Antibiotics Given (last 72 hours)    Date/Time Action Medication Dose Rate   10/11/14 2312 Given   meropenem (MERREM) 1 g in sodium chloride 0.9 % 100 mL IVPB 1 g 200 mL/hr   10/12/14 1101 Given   meropenem (MERREM) 1 g in sodium chloride 0.9 % 100 mL IVPB 1 g 200 mL/hr   10/12/14 2015 Given   meropenem (MERREM) 1 g in sodium chloride 0.9 % 100 mL IVPB 1 g 200 mL/hr   10/13/14 1046 Given  [med not available at scheduled time]   meropenem (MERREM) 1 g in sodium chloride 0.9 % 100 mL IVPB 1 g 200 mL/hr   10/13/14 2146 Given   meropenem (MERREM) 1 g in sodium chloride 0.9 % 100 mL IVPB 1 g 200 mL/hr   10/14/14 1154 Given   meropenem (MERREM) 1 g in sodium chloride 0.9 % 100 mL IVPB 1 g 200 mL/hr      Principal Problem:   Acute encephalopathy Active  Problems:   Diabetes mellitus with diabetic polyneuropathy   A-fib   Hypertension   Intracerebral hemorrhage   Tremor   UTI (lower urinary tract infection)   Aphasia    Time spent:    [facility]  [facility] Pager [phone]. If 7PM-7AM, please contact night-coverage at [email or web address], password [facility] 10/14/2014, 2:00 PM  LOS: 6 days

## 2014-10-14 NOTE — Progress Notes (Signed)
Physical Therapy Treatment Patient Details Name: Susan Calhoun MRN: [id number] DOB: 12/10/1927 Today's Date: 10/14/2014    History of Present Illness 80 y.o. female with Past medical history of diabetes mellitus, chronic kidney disease, hypertension, peripheral vascular disease, recent CVA with intraventricular hemorrhage status post drain placement and removal. She has a PEG from previous admission. Pt. found to have UTI. CXR No active disease.    PT Comments    Pt progressing slowly towards physical therapy goals. Little active movement noted during session today and pt required +2 total assist for all mobility. Pt demonstrating seated balance at EOB, and was able to self-correct occasionally. As pt fatigued, increased assist required to right trunk back to midline.   Follow Up Recommendations  SNF;Supervision/Assistance - 24 hour     Equipment Recommendations  None recommended by PT    Recommendations for Other Services       Precautions / Restrictions Precautions Precautions: Fall Restrictions Weight Bearing Restrictions: No    Mobility  Bed Mobility Overal bed mobility: Needs Assistance;+2 for physical assistance Bed Mobility: Rolling;Sidelying to Sit;Sit to Supine Rolling: Total assist;+2 for physical assistance Sidelying to sit: Total assist;+2 for physical assistance   Sit to supine: Total assist;+2 for physical assistance   General bed mobility comments: patient very lethargic, attempted EOB to arouse  Transfers                 General transfer comment: Did not attempt due to low level of arousal.   Ambulation/Gait                 Stairs            Wheelchair Mobility    Modified Rankin (Stroke Patients Only)       Balance Overall balance assessment: Needs assistance Sitting-balance support: Feet unsupported;Bilateral upper extremity supported Sitting balance-Leahy Scale: Poor Sitting balance - Comments: Pt with ability to  sit self-supported for short amounts of time. Occasionally pt was able to self-correct but as pt fatigued, increased assist was required to right trunk to upright posture. Pt was able to sit with hands outstretched on bedside table ~5 minutes. L UE posturing into a flexed position for most of sitting balance.                             Cognition Arousal/Alertness: Lethargic Behavior During Therapy: Flat affect Overall Cognitive Status: Difficult to assess     Current Attention Level: Focused                Exercises      General Comments        Pertinent Vitals/Pain Pain Assessment: Faces Faces Pain Scale: No hurt    Home Living                      Prior Function            PT Goals (current goals can now be found in the care plan section) Acute Rehab PT Goals Patient Stated Goal: unable to state PT Goal Formulation: Patient unable to participate in goal setting Time For Goal Achievement: 10/24/14 Potential to Achieve Goals: Good Progress towards PT goals: Not progressing toward goals - comment    Frequency  Min 2X/week    PT Plan Current plan remains appropriate    Co-evaluation             End of Session   Activity  Tolerance: Patient limited by lethargy Patient left: in bed;with bed alarm set;with call bell/phone within reach     Time: 0937-1003 PT Time Calculation (min) (ACUTE ONLY): 26 min  Charges:  $Therapeutic Activity: 8-22 mins $Neuromuscular Re-education: 8-22 mins                    G Codes:      Conni SlipperKirkman, Elianna Windom 10/14/2014, 10:31 AM  Conni SlipperLaura Alaa Eyerman, PT, DPT Acute Rehabilitation Services Pager: [phone]

## 2014-10-14 NOTE — Procedures (Signed)
ELECTROENCEPHALOGRAM REPORT   Patient: Susan Calhoun       Room #: 1O106E28 EEG No. ID: [id number] Age: 80 y.o.        Sex: female Referring Physician: Jomarie LongsJoseph Report Date:  10/13/2014        Interpreting Physician: Thana FarrEYNOLDS,Ahmere Hemenway D  History: Susan Calhoun is an 80 y.o. female with history of IVH and altered mental status  Medications:  Scheduled: . allopurinol  100 mg Per Tube BID  . amiodarone  200 mg Per Tube Daily  . antiseptic oral rinse  7 mL Mouth Rinse q12n4p  . aspirin  325 mg Per Tube Daily  . atorvastatin  80 mg Per Tube q1800  . chlorhexidine  15 mL Mouth Rinse BID  . diltiazem  30 mg Oral 4 times per day  . enoxaparin (LOVENOX) injection  30 mg Subcutaneous Q24H  . feeding supplement (VITAL AF 1.2 CAL)  280 mL Per Tube QID  . free water  300 mL Per Tube Q4H  . insulin aspart  0-15 Units Subcutaneous 6 times per day  . latanoprost  1 drop Right Eye QHS  . meropenem (MERREM) IV  1 g Intravenous Q12H  . metoprolol tartrate  100 mg Per Tube BID  . pantoprazole sodium  40 mg Per Tube Daily  . sodium chloride  3 mL Intravenous Q12H    Conditions of Recording:  This is a prolonged16 channel EEG carried out with concomitant video for 2 hours and 31 minutes.  The patient was restless throughout the recording.   Description:  The background activity is slow and consists of a poorly organized low voltage polymorphic delta activity.  The maximum posterior background rhythm noted is 4 Hertz delta.  Superimposed are intermittent discharges of triphasic morphology.  On most occasions they are generalized but are noted at times to have a shifting predominance.  There are periods during the tracing when no triphasic activity is seen but the slow background rhythm continues.   The patient remained restless throughout the recording.   Hyperventilation and intermittent photic stimulation were not performed.  IMPRESSION: This is an abnormal EEG secondary to the presence of general  background slowing and triphasic waves .  This finding may be seen with a diffuse disturbance that is etiologically nonspecific, but may include a metabolic encephalopathy, among other possibilities.   This electroencephalogram is improved from the prior recording of 10/09/2014 due the decreased predominance of the triphasic wave activity.       Thana FarrLeslie Jordin Vicencio, MD [facility] [phone] 10/13/2014, 7:30 PM

## 2014-10-14 NOTE — Progress Notes (Signed)
NEURO HOSPITALIST PROGRESS NOTE   SUBJECTIVE:                                                                                                                        [name] can not follow commands. She was not even opening her eyes even though she was turning her head from side to side. She is completely nonverbal when I saw her this am. No family at bedside. Prolonged EEG revealed no evidence of electrographic seizures but diffuse slowing with a pattern consistent with a moderately severe encephalopathy.   OBJECTIVE:                                                                                                                           Vital signs in last 24 hours: Temp:  [98.1 F (36.7 C)-98.6 F (37 C)] 98.6 F (37 C) (11/24 0504) Pulse Rate:  [72-80] 80 (11/24 0504) Resp:  [16-18] 18 (11/24 0504) BP: (123-130)/(48-71) 130/71 mmHg (11/24 0504) SpO2:  [97 %-100 %] 100 % (11/24 0504) Weight:  [144 lb 4.8 oz (65.454 kg)] 144 lb 4.8 oz (65.454 kg) (11/23 2046)  Intake/Output from previous day: 11/23 0701 - 11/24 0700 In: 1960 [P.O.:120; NG/GT:1740; IV Piggyback:100] Out: 0  Intake/Output this shift:   Nutritional status: Diet NPO time specified  Past Medical History  Diagnosis Date  . Diabetes mellitus with diabetic polyneuropathy   . A-fib   . Renal disorder   . Hypertension   . Anemia   . Glaucoma     right eye   . Carotid artery stenosis   . PVD (peripheral vascular disease)     Physical exam: no apparent distress. Does not follow commands.  Head: normocephalic. Neck: supple, no bruits, no JVD. Cardiac: no murmurs. Lungs: clear. Abdomen: soft, no tender, no mass. Extremities: no edema.   Neurologic Exam:  MS: briefly opens her eyes with sound, does not follow commands CN: resists eye exam.   Motor: Moves all extremities  Sensory:responds to nox stim x 4.   Lab Results: Lab Results  Component Value Date/Time    CHOL 204* 09/02/2014 05:30 AM   Lipid Panel No results for input(s): CHOL, TRIG, HDL, CHOLHDL, VLDL, LDLCALC in the last 72  hours.  Studies/Results: No results found.  MEDICATIONS                                                                                                                        Scheduled: . allopurinol  100 mg Per Tube BID  . amiodarone  200 mg Per Tube Daily  . antiseptic oral rinse  7 mL Mouth Rinse q12n4p  . aspirin  325 mg Per Tube Daily  . atorvastatin  80 mg Per Tube q1800  . chlorhexidine  15 mL Mouth Rinse BID  . diltiazem  30 mg Oral 4 times per day  . enoxaparin (LOVENOX) injection  30 mg Subcutaneous Q24H  . feeding supplement (VITAL AF 1.2 CAL)  280 mL Per Tube QID  . free water  300 mL Per Tube Q4H  . insulin aspart  0-15 Units Subcutaneous 6 times per day  . latanoprost  1 drop Right Eye QHS  . meropenem (MERREM) IV  1 g Intravenous Q12H  . metoprolol tartrate  100 mg Per Tube BID  . pantoprazole sodium  40 mg Per Tube Daily  . sodium chloride  3 mL Intravenous Q12H    ASSESSMENT/PLAN:                                                                                                           80 y/o with recent non-dominant IVH secondary to coumadin associated coagulopathy in setting of uncontrolled hypertension, re admitted with AMS. Has UTI resistant to cephalosporins and now on meropenem. She remains very lethargic and unable to cooperate with the exam. She otherwise has remained stable and no report of seizure activity. Difficult to say how much she is improving. Ongoing encephalopathy could be related to UTI in a patient with poor brain reserve. Brain MRI revealed "Slightly decreased ventricular dilatation since 09/12/2014 MRI". No other abnormalities were reported. Prolonged EEG showed no evidence of NCSE. Will follow up. Case discussed with Dr Leroy Kennedyamilo.   Dow Adolphichard Kazibwe, MD PGY-3 Internal Medicine Teaching Service Pager: [phone] 10/14/2014,  10:05 AM   Patient seen and examined together with PGY-3 Internal Medicine and I concur with the assessment and plan.  Susan Portelasvaldo Athony Coppa, MD

## 2014-10-14 NOTE — Progress Notes (Signed)
Speech Language Pathology Discharge Patient Details Name: Susan Calhoun MRN: [id number] DOB: 09/27/1928 Today's Date: 10/14/2014 Time:  -     Patient discharged from SLP services.  Multiple attempts/visits from ST since last week to determine po readiness and safety.  Pt. Continues with poor responsiveness and inability to consume po's.  She has a PEG.  Please re-order if pt. Becomes alert and appropriate.  Please see latest therapy progress note for current level of functioning and progress toward goals.    Progress and discharge plan discussed with patient and/or caregiver: pt unable GO     Roque CashLitaker, Shellia CleverlyLisa Willis   Edelin Fryer Willis Mateusz Neilan M.Ed CCC-SLP Pager [phone]     10/14/2014, 1:37 PM

## 2014-10-15 DIAGNOSIS — Z515 Encounter for palliative care: Secondary | ICD-10-CM

## 2014-10-15 DIAGNOSIS — Z7189 Other specified counseling: Secondary | ICD-10-CM

## 2014-10-15 LAB — GLUCOSE, CAPILLARY
GLUCOSE-CAPILLARY: 115 mg/dL — AB (ref 70–99)
GLUCOSE-CAPILLARY: 214 mg/dL — AB (ref 70–99)
Glucose-Capillary: 103 mg/dL — ABNORMAL HIGH (ref 70–99)
Glucose-Capillary: 147 mg/dL — ABNORMAL HIGH (ref 70–99)
Glucose-Capillary: 111 mg/dL — ABNORMAL HIGH (ref 70–99)
Glucose-Capillary: 130 mg/dL — ABNORMAL HIGH (ref 70–99)

## 2014-10-15 LAB — CLOSTRIDIUM DIFFICILE BY PCR: CDIFFPCR: NEGATIVE

## 2014-10-15 NOTE — Progress Notes (Signed)
NEURO HOSPITALIST PROGRESS NOTE   SUBJECTIVE:                                                                                                                        Susan Calhoun still unchanged. Unable to follow simple commands like "squeeze my finger''.  Otherwise no overnight events.  PT recommended SNF. Palliative care consulted.   A prolonged EEG performed yesterday was reported as " abnormal EEG secondary to the presence of general  background slowing and triphasic waves . This finding may be seen with a diffuse disturbance that is etiologically  nonspecific, but may include a metabolic encephalopathy, among other possibilities. This electroencephalogram is improved from the prior recording of 10/09/2014 due the decreased predominance of the triphasic wave  Activity"      OBJECTIVE:                                                                                                                           Vital signs in last 24 hours: Temp:  [98 F (36.7 C)-99.1 F (37.3 C)] 98.7 F (37.1 C) (11/25 0917) Pulse Rate:  [63-94] 94 (11/25 0917) Resp:  [19-22] 20 (11/25 0917) BP: (104-170)/(44-89) 170/46 mmHg (11/25 0917) SpO2:  [100 %] 100 % (11/25 0917) Weight:  [141 lb (63.957 kg)] 141 lb (63.957 kg) (11/25 0438)  Intake/Output from previous day: 11/24 0701 - 11/25 0700 In: 1280 [NG/GT:1180; IV Piggyback:100] Out: -  Intake/Output this shift:   Nutritional status: Diet NPO time specified  Past Medical History  Diagnosis Date  . Diabetes mellitus with diabetic polyneuropathy   . A-fib   . Renal disorder   . Hypertension   . Anemia   . Glaucoma     right eye   . Carotid artery stenosis   . PVD (peripheral vascular disease)     Physical exam: no apparent distress. Does not follow commands.  Head: normocephalic. Neck: supple, no bruits, no JVD. Cardiac: no murmurs. Lungs: clear. Abdomen: soft, no tender, no mass. Extremities:  no edema.   Neurologic Exam:  MS: briefly opens her eyes with sound, does not follow commands. Unable to participate in a full neurologic exam. CN: resists eye exam.   Motor: Moves all extremities  Sensory:responds to nox stim x 4.   Lab Results: Lab Results  Component Value Date/Time   CHOL 204* 09/02/2014 05:30 AM   Lipid Panel No results for input(s): CHOL, TRIG, HDL, CHOLHDL, VLDL, LDLCALC in the last 72 hours.  Studies/Results: No results found.  MEDICATIONS                                                                                                                        Scheduled: . allopurinol  100 mg Per Tube BID  . amiodarone  200 mg Per Tube Daily  . antiseptic oral rinse  7 mL Mouth Rinse q12n4p  . aspirin  325 mg Per Tube Daily  . atorvastatin  80 mg Per Tube q1800  . chlorhexidine  15 mL Mouth Rinse BID  . diltiazem  30 mg Oral 4 times per day  . enoxaparin (LOVENOX) injection  30 mg Subcutaneous Q24H  . feeding supplement (VITAL AF 1.2 CAL)  280 mL Per Tube QID  . free water  300 mL Per Tube Q4H  . insulin aspart  0-15 Units Subcutaneous 6 times per day  . latanoprost  1 drop Right Eye QHS  . meropenem (MERREM) IV  1 g Intravenous Q12H  . metoprolol tartrate  100 mg Per Tube BID  . pantoprazole sodium  40 mg Per Tube Daily  . sodium chloride  3 mL Intravenous Q12H    ASSESSMENT/PLAN:                                                                                                           80 y/o with recent non-dominant IVH secondary to coumadin associated coagulopathy in setting of uncontrolled hypertension, re admitted with AMS. Has UTI resistant to cephalosporins and now on meropenem. She remains very lethargic and unable to cooperate with the exam. She otherwise has remained stable and no report of seizure activity. Clinically unchanged. Ongoing encephalopathy could be related to UTI in a patient with poor brain reserve. Brain MRI revealed "Slightly  decreased ventricular dilatation since 09/12/2014 MRI". No other abnormalities were reported. Prolonged EEG 10/14/2014 reported some improvement but with persistent triphasic wave activity.   Plan Will follow up. Question utility for repeat cranial MRI. SNF placement and palliative care consult underway  Case discussed with Dr Leroy Kennedyamilo.   Dow Adolphichard Kazibwe, MD PGY-3 Internal Medicine Teaching Service Pager: [phone] 10/15/2014, 9:33 AM   Patient seen and examined together with Dow Adolphichard Kazibwe, MD, PGY-3 Internal Medicine and I concur with the assessment and plan.  Wyatt Portelasvaldo Camilo, MD

## 2014-10-15 NOTE — Consult Note (Signed)
Patient Susan Calhoun:Susan Calhoun      DOB: 12/12/1927      WJX:[id number]     [facility]    Consult Requested by:  Dr Tat     PCP: Pcp Not In System Reason for Consultation:Clarification of GOC and options     Phone Number:None  Assessment of patients Current state:  Continued physical, functional and cognitive decliene 2/2 recent CVA and cerebral hemorrage, sp drain and since removed.  Per family was progressing at SNF with PT and now admitted with altered mental status and UTI. Family faced with advanced directive decisions and anticipatory care needs.  Consult is for review of medical treatment options, clarification of goals of care and end of life issues, disposition and options, and symptom recommendation.  This NP Lorinda CreedMary Larach reviewed medical records, received report from team, assessed the patient and then meet at the patient's bedside along with her daughter Charlann LangeVivianna Calhoun and Granddaughter Murlean IbaMonique Crutchfielf  to discuss diagnosis prognosis, GOC, EOL wishes disposition and options.  A detailed discussion was had today regarding advanced directives.  Concepts specific to code status, artifical feeding and hydration, continued IV antibiotics and rehospitalization was had.  The difference between a aggressive medical intervention path  and a palliative comfort care path for this patient at this time was had.  Values and goals of care important to patient and family were attempted to be elicited.  Concept of Hospice and Palliative Care were discussed  Natural trajectory and expectations at EOL were discussed.  Questions and concerns addressed.  Hard Choices booklet left for review. Family encouraged to call with questions or concerns.  PMT will continue to support holistically.   Goals of Care: 1.  Code Status: DNR/DNI   2. Scope of Treatment: Continue current medical interventions to treat the treatable, family remains hopeful for  improvement.  3. Disposition: At this time plan is back to SNF for continued rehabilitation.  However they are realistic and understand that patient's medical situation may not improve and other decisions will have to be made.  For now watchful waiting and one day at a time.   4. Symptom Management:   1.  Weakness/encephalopathy/failure to thrive--treat the treatable, family open to continued workup for investigation into treatable cause for rapid decline    5. Psychosocial:  Emotional support offered to family at bedside.  Situation is especially difficult for family since patient was totally independent and living alone less than 2 months ago.  They struggle with "knowing" when it is time to shift to a more comfort approach.  PMT will continue to support holistically   Patient Documents Completed or Given: Document Given Completed  Advanced Directives Pkt    MOST X   DNR    Gone from My Sight    Hard Choices X     Brief HPI:  80 y.o. female with past medical history of diabetes mellitus, chronic kidney disease, hypertension, peripheral vascular disease, recent CVA with intraventricular hemorrhage status post drain placement and removal.  The patient is presenting with complaint of confusion,  brought in by her daughter from the nursing home. As per the daughter since last 3-4 days the patient has been having progressively worsening mental status.  She was recently admitted for hemorrhagic stroke and the after the discharge as per the daughter the patient has been communicating well has been working with physical therapy well without any complication and she has been able to tolerate oral  feeding as well as tube feeding.  No fall no trauma no injury reported. No fever no chills no chest pain reported. No cough reported.  The patient is coming from SNF. And at her baseline dependent for most of her ADL. MRI without acute changes  Family continues to face advanced directive  decisions and anticipatory care needs.  ROS: unable to illicit due to encephalopathy   PMH:  Past Medical History  Diagnosis Date  . Diabetes mellitus with diabetic polyneuropathy   . A-fib   . Renal disorder   . Hypertension   . Anemia   . Glaucoma     right eye   . Carotid artery stenosis   . PVD (peripheral vascular disease)      PSH: Past Surgical History  Procedure Laterality Date  . Replacement total knee Right   . Carotid endarterectomy  2015  . Coronary artery bypass graft    . Ablation      cardiac for arrthymia    I have reviewed the FH and SH and  If appropriate update it with new information. Allergies  Allergen Reactions  . Septra [Sulfamethoxazole-Trimethoprim] Hives   Scheduled Meds: . allopurinol  100 mg Per Tube BID  . amiodarone  200 mg Per Tube Daily  . antiseptic oral rinse  7 mL Mouth Rinse q12n4p  . aspirin  325 mg Per Tube Daily  . atorvastatin  80 mg Per Tube q1800  . chlorhexidine  15 mL Mouth Rinse BID  . diltiazem  30 mg Oral 4 times per day  . enoxaparin (LOVENOX) injection  30 mg Subcutaneous Q24H  . feeding supplement (VITAL AF 1.2 CAL)  280 mL Per Tube QID  . free water  300 mL Per Tube Q4H  . insulin aspart  0-15 Units Subcutaneous 6 times per day  . latanoprost  1 drop Right Eye QHS  . meropenem (MERREM) IV  1 g Intravenous Q12H  . metoprolol tartrate  100 mg Per Tube BID  . pantoprazole sodium  40 mg Per Tube Daily  . sodium chloride  3 mL Intravenous Q12H   Continuous Infusions:  PRN Meds:.acetaminophen **OR** acetaminophen, ondansetron **OR** ondansetron (ZOFRAN) IV    BP 170/46 mmHg  Pulse 94  Temp(Src) 98.7 F (37.1 C) (Oral)  Resp 20  Ht 4\' 11"  (1.499 m)  Wt 63.957 kg (141 lb)  BMI 28.46 kg/m2  SpO2 100%   PPS: 20 %   Intake/Output Summary (Last 24 hours) at 10/15/14 1106 Last data filed at 10/15/14 0700  Gross per 24 hour  Intake   1280 ml  Output      0 ml  Net   1280 ml    Physical  Exam:  General: chronically ill appearing, NAD HEENT: moist buccal membranes Chest:  CTA CVS: RRR Abdomen: soft NT noted Ext: without edema, noted flexion wrists Neuro: unable to follow commands,   Labs: CBC    Component Value Date/Time   WBC 12.5* 10/14/2014 0557   RBC 3.57* 10/14/2014 0557   HGB 10.5* 10/14/2014 0557   HCT 33.6* 10/14/2014 0557   PLT 255 10/14/2014 0557   MCV 94.1 10/14/2014 0557   MCH 29.4 10/14/2014 0557   MCHC 31.3 10/14/2014 0557   RDW 16.7* 10/14/2014 0557   LYMPHSABS 1.7 10/09/2014 0325   MONOABS 0.8 10/09/2014 0325   EOSABS 0.2 10/09/2014 0325   BASOSABS 0.1 10/09/2014 0325    BMET    Component Value Date/Time   NA 137 10/14/2014 0557  K 4.2 10/14/2014 0557   CL 101 10/14/2014 0557   CO2 20 10/14/2014 0557   GLUCOSE 144* 10/14/2014 0557   BUN 36* 10/14/2014 0557   CREATININE 0.96 10/14/2014 0557   CALCIUM 8.5 10/14/2014 0557   GFRNONAA 53* 10/14/2014 0557   GFRAA 61* 10/14/2014 0557    CMP     Component Value Date/Time   NA 137 10/14/2014 0557   K 4.2 10/14/2014 0557   CL 101 10/14/2014 0557   CO2 20 10/14/2014 0557   GLUCOSE 144* 10/14/2014 0557   BUN 36* 10/14/2014 0557   CREATININE 0.96 10/14/2014 0557   CALCIUM 8.5 10/14/2014 0557   PROT 6.6 10/09/2014 0325   ALBUMIN 2.3* 10/09/2014 0325   AST 35 10/09/2014 0325   ALT 50* 10/09/2014 0325   ALKPHOS 85 10/09/2014 0325   BILITOT <0.2* 10/09/2014 0325   GFRNONAA 53* 10/14/2014 0557   GFRAA 61* 10/14/2014 0557     Time In Time Out Total Time Spent with Patient Total Overall Time  1100 1215 70 min 75 min    Greater than 50%  of this time was spent counseling and coordinating care related to the above assessment and plan.   Lorinda Creed NP  Palliative Medicine Team Team Phone # [phone] Pager [phone]  Discussed with Dr Tat

## 2014-10-15 NOTE — Plan of Care (Signed)
Problem: Phase II Progression Outcomes Goal: Vital signs remain stable, temperature < 100 Outcome: Completed/Met Date Met:  10/15/14 Goal: Tolerating diet Outcome: Completed/Met Date Met:  10/15/14 Patient is currently on tube feedings. Goal: Voiding independently Outcome: Completed/Met Date Met:  10/15/14 Goal: Other Phase II Outcomes/Goals Outcome: Not Applicable Date Met:  34/94/94

## 2014-10-15 NOTE — Progress Notes (Signed)
PROGRESS NOTE  Susan Calhoun RUE:[id number]:[id number] DOB: 05/08/1928 DOA: 10/08/2014 PCP: Pcp Not In System  Assessment/Plan: Acute encephalopathy -In the setting of recent IVH s/p drain --admitted with recent CVA with IVH, hydrocephalous and h/o temporary drain with fluctuating mental status then, went to SNF 11/10. -She had multiple EEGs including LTM done showing general slowing and frequent intermittent triphasic waves. She did have generalized tremors during hospital stay with no EEG correlate --MRI without acute changes -was on Cefepime for UTI, FU Urine Cx- with Klebsiella, resistant to Cephalosporins, hence changed to Meropenem 11/21 -unfortunately continues to be obtunded without any improvement -ammonia--14 -check RPR, B12, folate, am cortisol -TSH 2.060 Dysphagia -in setting of recent IVH  -continue enteral feeds Atrial fibrillation -not an AC candidate -continue metoprolol tartrate and diltiazem -Continue amiodarone -presently in sinus UTI -continue Merrem (started 11/21) Diarrhea -C.diff neg -likely due to enteral feeds Hypertension -Continue metoprolol tartrate and diltiazem    Family Communication:   Daughter updated at beside Disposition Plan:   Home when medically stable       Procedures/Studies: Ct Abdomen Wo Contrast  09/23/2014   CLINICAL DATA:  Malnutrition.  Anatomy for PEG tube placement.  EXAM: CT ABDOMEN WITHOUT CONTRAST  TECHNIQUE: Multidetector CT imaging of the abdomen was performed following the standard protocol without IV contrast.  COMPARISON:  None.  FINDINGS: LOWER CHEST: Extensive coronary atherosclerosis. Partially visualized aortic valve sclerosis/calcification.  Small and layering bilateral pleural effusions with passive atelectasis.  ABDOMEN/PELVIS:  Liver: No focal abnormality.  Biliary: Sludge or other layering high density within the gallbladder. No distention.  Pancreas: Unremarkable.  Spleen: Unremarkable.  Adrenals:  Unremarkable.  Kidneys and ureters: No hydronephrosis.  Bowel: Feeding tube tip terminates in the proximal stomach. The non insufflated stomach minimally overlaps with the left lobe of the liver. The transverse colon is well inferior to the stomach. There is no evidence of gastric mass or displacement. No abdominal wall hernia or other impediment. No bowel obstruction.  Retroperitoneum: No mass or adenopathy.  Peritoneum: No ascites or pneumoperitoneum.  Vascular: Extensive atherosclerotic calcification.  OSSEOUS: No acute abnormalities.  IMPRESSION: 1. Normal gastric morphology and position. Surrounding anatomy described above. 2. Small pleural effusions with passive atelectasis.   Electronically Signed   By: Tiburcio PeaJonathan  Watts M.D.   On: 09/23/2014 10:05   Dg Chest 2 View  09/28/2014   CLINICAL DATA:  Fever, history of hypertension  EXAM: CHEST  2 VIEW  COMPARISON:  09/17/2014  FINDINGS: Evidence of CABG. Lungs are hypoaerated with crowding of the bronchovascular markings. Mild cardiomegaly reidentified with central vascular congestion but no overt edema. No new focal pulmonary opacity. Trace pleural effusions are noted.  IMPRESSION: Mild cardiomegaly with central vascular congestion and trace pleural fluid.   Electronically Signed   By: Christiana PellantGretchen  Green M.D.   On: 09/28/2014 15:45   Ct Head Wo Contrast  10/08/2014   CLINICAL DATA:  Increasing weakness and lethargy since Saturday, recent UTIs, personal history of diabetes, renal disorder, hypertension  EXAM: CT HEAD WITHOUT CONTRAST  TECHNIQUE: Contiguous axial images were obtained from the base of the skull through the vertex without intravenous contrast.  COMPARISON:  09/26/2014  FINDINGS: Minimal atrophy.  Mild prominence of the ventricular system little changed.  Small vessel chronic ischemic changes of deep cerebral white matter.  Intraventricular blood identified at the occipital horns of the lateral ventricles on the previous exam no longer identified.   Focus of high attenuation  at the RIGHT parietal lobe likely tiny calcification, unchanged since 09/04/2014 making this unlikely to represent blood.  No definite high attenuation acute intracranial hemorrhage, mass lesion or evidence acute infarction.  No extra-axial fluid collections.  Atherosclerotic calcifications at the carotid siphons.  Scattered motion artifacts.  Prior burr hole RIGHT frontal.  Hyperostosis frontalis interna.  No acute osseous findings.  IMPRESSION: Motion artifacts.  Atrophy with mild persistent prominence of the ventricular system and small vessel chronic ischemic changes.  Resolution of minimal intraventricular blood seen on the previous exam.   Electronically Signed   By: Ulyses Southward M.D.   On: 10/08/2014 22:13   Ct Head Wo Contrast  09/26/2014   CLINICAL DATA:  Progressive cognitive decline over the last 2 days.  EXAM: CT HEAD WITHOUT CONTRAST  TECHNIQUE: Contiguous axial images were obtained from the base of the skull through the vertex without intravenous contrast.  COMPARISON:  CT head without contrast 09/14/2014.  FINDINGS: Minimal blood products are still seen dependently within the posterior horns of the lateral ventricles. There is no new hemorrhage. Ventricular enlargement is similar to the prior study although the temporal horns may be slightly more prominent. No acute cortical infarct, hemorrhage, or other mass lesion is present. Mild mucosal thickening is present in the sphenoid sinuses bilaterally. The remaining paranasal sinuses and the mastoid air cells are clear.  IMPRESSION: 1. Near complete resolution of layering blood products in the posterior horn of the lateral ventricles bilaterally. 2. Stable ventricular enlargement. 3. No new hemorrhage. 4. Stable atrophy and moderate white matter disease.   Electronically Signed   By: Gennette Pac M.D.   On: 09/26/2014 18:20   Mr Brain Wo Contrast  10/10/2014   CLINICAL DATA:  Encephalopathy. Intraventricular hemorrhage  and drain placement last month.  EXAM: MRI HEAD WITHOUT CONTRAST  TECHNIQUE: Multiplanar, multiecho pulse sequences of the brain and surrounding structures were obtained without intravenous contrast.  COMPARISON:  Head CT 09/28/2014 and MRI 09/12/2014  FINDINGS: Images are mildly to moderately degraded by motion artifact.  No acute infarct is identified. There is a small amount of diffusion weighted signal abnormality along the prior right frontal ventriculostomy catheter tract likely secondary to prior intervention. No sizable intracranial hemorrhage is identified, although T2* images are particularly motion degraded. There may be minimal residual chronic blood products in the occipital horns.  The lateral and third ventricles remain moderately dilated but overall slightly improved compared to the prior MRI. Mild T2 hyperintensity in the periventricular white matter bilaterally does not appear significantly changed, nonspecific. No mass, midline shift, or extra-axial fluid collection is identified.  Prior bilateral cataract extraction is noted. There is mild right greater the left sphenoid sinus mucosal thickening. Mastoid air cells are clear. Major intracranial vascular flow voids are preserved.  IMPRESSION: 1. No evidence acute/new intracranial abnormality. 2. Slightly decreased ventricular dilatation since 09/12/2014 MRI.   Electronically Signed   By: Sebastian Ache   On: 10/10/2014 12:22   Ir Gastrostomy Tube Mod Sed  09/23/2014   INDICATION: Patient admitted with intraventricular/subarachnoid hemorrhage with persistent altered mental status and now with dysphagia. Request made for percutaneous gastrostomy tube placement for enteric nutrition.  EXAM: PULL TROUGH GASTROSTOMY TUBE PLACEMENT  COMPARISON:  CT abdomen and pelvis - 09/23/2014  MEDICATIONS: The patient is currently admitted to the hospital receiving intravenous antibiotics. The antibiotics were administered within an appropriate time frame prior to  initiation of the procedure.  CONTRAST:  15 mL of Isovue 300 administered into the  gastric lumen.  ANESTHESIA/SEDATION: Versed 1 mg IV; Fentanyl 50 mcg IV  Sedation time  25 minutes  FLUOROSCOPY TIME:  4 minutes.  COMPLICATIONS: None immediate  PROCEDURE: Informed written consent was obtained from the patient's daughter following explanation of the procedure, risks, benefits and alternatives. A time out was performed prior to the initiation of the procedure. Ultrasound scanning was performed to demarcate the edge of the left lobe of the liver. Maximal barrier sterile technique utilized including caps, mask, sterile gowns, sterile gloves, large sterile drape, hand hygiene and Betadine prep.  The left upper quadrant was sterilely prepped and draped. An oral gastric catheter was inserted into the stomach under fluoroscopy. The existing nasogastric feeding tube was removed. The left costal margin and air opacified transverse colon were identified and avoided. Air was injected into the stomach for insufflation and visualization under fluoroscopy. Under sterile conditions a 17 gauge trocar needle was utilized to access the stomach percutaneously beneath the left subcostal margin after the overlying soft tissues were anesthetized with 1% Lidocaine with epinephrine. Needle position was confirmed within the stomach with aspiration of air and injection of small amount of contrast. A single T tack was deployed for gastropexy. Over an Amplatz guide wire, a 9-French sheath was inserted into the stomach. A snare device was utilized to capture the oral gastric catheter. The snare device was pulled retrograde from the stomach up the esophagus and out the oropharynx. The 20-French pull-through gastrostomy was connected to the snare device and pulled antegrade through the oropharynx down the esophagus into the stomach and then through the percutaneous tract external to the patient. The gastrostomy was assembled externally. Contrast  injection confirms position in the stomach. Several spot radiographic images were obtained in various obliquities for documentation. The patient tolerated procedure well without immediate post procedural complication.  FINDINGS: After successful fluoroscopic guided placement, the gastrostomy tube is appropriately positioned with internal disc against the ventral aspect of the gastric lumen.  IMPRESSION: Successful fluoroscopic insertion of a 20-French pull-through gastrostomy tube.  The gastrostomy may be used immediately for medication administration and in 24 hrs for the initiation of feeds.   Electronically Signed   By: Simonne Come M.D.   On: 09/23/2014 14:55   Dg Chest Port 1 View  10/08/2014   CLINICAL DATA:  Initial evaluation for weakness and lethargy for 3 days with altered mental status  EXAM: PORTABLE CHEST - 1 VIEW  COMPARISON:  09/30/2014  FINDINGS: The heart size and mediastinal contours are within normal limits. Both lungs are clear. The visualized skeletal structures are unremarkable.  IMPRESSION: No active disease.   Electronically Signed   By: Esperanza Heir M.D.   On: 10/08/2014 21:32   Dg Chest Port 1 View  09/30/2014   CLINICAL DATA:  Cough.  EXAM: PORTABLE CHEST - 1 VIEW  COMPARISON:  09/28/2014.  FINDINGS: Mediastinum hilar structures are normal. Prior CABG. Stable cardiomegaly. Interval improvement of pulmonary venous congestion. Mild bibasilar subsegmental atelectasis. Small left pleural effusion cannot be excluded. No pneumothorax. Degenerative changes thoracic spine and both shoulders.  IMPRESSION: 1. Prior CABG. Stable cardiomegaly. Interim resolution of pulmonary venous congestion. 2. Mild bibasilar atelectasis. Small left pleural effusion cannot be excluded.   Electronically Signed   By: Maisie Fus  Register   On: 09/30/2014 07:23   Dg Chest Port 1 View  09/17/2014   CLINICAL DATA:  Congestive heart failure  EXAM: PORTABLE CHEST - 1 VIEW  COMPARISON:  09/14/2014  FINDINGS:  Cardiomegaly again noted. Central  vascular congestion without convincing pulmonary edema. Persistent small bilateral pleural effusion with bilateral basilar atelectasis or infiltrate. Status post median sternotomy. Stable NG tube and left IJ central line position.  IMPRESSION: Central vascular congestion without convincing pulmonary edema. Persistent small bilateral pleural effusion with bilateral basilar atelectasis or infiltrate. Status post median sternotomy. Stable NG tube and left IJ central line position.   Electronically Signed   By: Natasha MeadLiviu  Pop M.D.   On: 09/17/2014 09:25   Dg Abd Portable 1v  09/20/2014   CLINICAL DATA:  Feeding tube placement.  EXAM: PORTABLE ABDOMEN - 1 VIEW  COMPARISON:  09/09/14  FINDINGS: A feeding tube is now seen with the tip overlying the proximal body of the stomach. No evidence of dilated bowel loops.  IMPRESSION: Feeding tube tip overlies the proximal body of the stomach.   Electronically Signed   By: Myles RosenthalJohn  Stahl M.D.   On: 09/20/2014 19:20         Subjective: Patient intermittently opens eyes, but does not follow commands. No reports of vomiting, respiratory distress, profuse diarrhea, or uncontrolled pain. Does not answer any questions.  Objective: Filed Vitals:   10/14/14 2005 10/15/14 0423 10/15/14 0438 10/15/14 0917  BP: 144/61 118/89  170/46  Pulse: 70 78  94  Temp: 99 F (37.2 C) 98.9 F (37.2 C)  98.7 F (37.1 C)  TempSrc:    Oral  Resp: 19 21  20   Height:      Weight: 63.957 kg (141 lb)  63.957 kg (141 lb)   SpO2: 100% 100%  100%    Intake/Output Summary (Last 24 hours) at 10/15/14 1644 Last data filed at 10/15/14 0700  Gross per 24 hour  Intake   1280 ml  Output      0 ml  Net   1280 ml   Weight change: -1.497 kg (-3 lb 4.8 oz) Exam:   General:  Pt is alert, follows commands appropriately, not in acute distress  HEENT: No icterus, No thrush, No neck mass, [place]/AT  Cardiovascular: RRR, S1/S2, no rubs, no gallops  Respiratory:  CTA bilaterally, no wheezing, no crackles, no rhonchi  Abdomen: Soft/+BS, non tender, non distended, no guarding  Extremities: No edema, No lymphangitis, No petechiae, No rashes, no synovitis  Data Reviewed: Basic Metabolic Panel:  Recent Labs Lab 10/08/14 2021 10/08/14 2107 10/09/14 0325 10/10/14 0642 10/13/14 0515 10/14/14 0557  NA 138  --  144 140 139 137  K >7.7* 4.9 4.4 4.1 4.1 4.2  CL 102  --  107 102 101 101  CO2 22  --  22 23 22 20   GLUCOSE 111*  --  177* 122* 171* 144*  BUN 56*  --  50* 32* 33* 36*  CREATININE 1.40*  --  1.50* 1.25* 1.02 0.96  CALCIUM 8.6  --  8.5 8.8 8.6 8.5   Liver Function Tests:  Recent Labs Lab 10/08/14 2021 10/09/14 0325  AST 90* 35  ALT 56* 50*  ALKPHOS 78 85  BILITOT 0.2* <0.2*  PROT 7.1 6.6  ALBUMIN 2.4* 2.3*   No results for input(s): LIPASE, AMYLASE in the last 168 hours.  Recent Labs Lab 10/10/14 1915  AMMONIA 14   CBC:  Recent Labs Lab 10/08/14 2021 10/09/14 0325 10/10/14 0642 10/14/14 0557  WBC 8.5 7.5 7.5 12.5*  NEUTROABS  --  4.8  --   --   HGB 10.5* 10.5* 11.2* 10.5*  HCT 34.7* 34.8* 36.6 33.6*  MCV 96.9 96.7 95.8 94.1  PLT 263  260 271 255   Cardiac Enzymes: No results for input(s): CKTOTAL, CKMB, CKMBINDEX, TROPONINI in the last 168 hours. BNP: Invalid input(s): POCBNP CBG:  Recent Labs Lab 10/14/14 2006 10/14/14 2358 10/15/14 0421 10/15/14 0752 10/15/14 1215  GLUCAP 138* 214* 103* 115* 147*    Recent Results (from the past 240 hour(s))  Urine culture     Status: None   Collection Time: 10/08/14  8:56 PM  Result Value Ref Range Status   Specimen Description URINE, CATHETERIZED  Final   Special Requests NONE  Final   Culture  Setup Time   Final    10/09/2014 05:03 Performed at [facility] Count   Final    >=100,000 COLONIES/ML Performed at [facility]    Culture   Final    KLEBSIELLA PNEUMONIAE Note: MEROPENEM SENSITIVE <=0.25 Performed at [facility]    Report Status 10/13/2014 FINAL  Final   Organism ID, Bacteria KLEBSIELLA PNEUMONIAE  Final      Susceptibility   Klebsiella pneumoniae - MIC*    AMPICILLIN >=32 RESISTANT Resistant     CEFAZOLIN >=64 RESISTANT Resistant     CEFTRIAXONE 32 INTERMEDIATE Intermediate     CIPROFLOXACIN <=0.25 SENSITIVE Sensitive     GENTAMICIN >=16 RESISTANT Resistant     LEVOFLOXACIN <=0.12 SENSITIVE Sensitive     NITROFURANTOIN 32 SENSITIVE Sensitive     TOBRAMYCIN 2 SENSITIVE Sensitive     TRIMETH/SULFA <=20 SENSITIVE Sensitive     PIP/TAZO 64 INTERMEDIATE Intermediate     * KLEBSIELLA PNEUMONIAE  MRSA PCR Screening     Status: None   Collection Time: 10/09/14  1:21 AM  Result Value Ref Range Status   MRSA by PCR NEGATIVE NEGATIVE Final    Comment:        The GeneXpert MRSA Assay (FDA approved for NASAL specimens only), is one component of a comprehensive MRSA colonization surveillance program. It is not intended to diagnose MRSA infection nor to guide or monitor treatment for MRSA infections.   Clostridium Difficile by PCR     Status: None   Collection Time: 10/14/14  8:20 PM  Result Value Ref Range Status   C difficile by pcr NEGATIVE NEGATIVE Final     Scheduled Meds: . allopurinol  100 mg Per Tube BID  . amiodarone  200 mg Per Tube Daily  . antiseptic oral rinse  7 mL Mouth Rinse q12n4p  . aspirin  325 mg Per Tube Daily  . atorvastatin  80 mg Per Tube q1800  . chlorhexidine  15 mL Mouth Rinse BID  . diltiazem  30 mg Oral 4 times per day  . enoxaparin (LOVENOX) injection  30 mg Subcutaneous Q24H  . feeding supplement (VITAL AF 1.2 CAL)  280 mL Per Tube QID  . free water  300 mL Per Tube Q4H  . insulin aspart  0-15 Units Subcutaneous 6 times per day  . latanoprost  1 drop Right Eye QHS  . meropenem (MERREM) IV  1 g Intravenous Q12H  . metoprolol tartrate  100 mg Per Tube BID  . pantoprazole sodium  40 mg Per Tube Daily  . sodium chloride  3 mL Intravenous Q12H    Continuous Infusions:    Thersa Mohiuddin, DO  [facility] Pager [phone]  If 7PM-7AM, please contact night-coverage [email or web address] Password [email or web address] 10/15/2014, 4:44 PM   LOS: 7 days

## 2014-10-15 NOTE — Progress Notes (Signed)
NUTRITION FOLLOW UP  INTERVENTION: - Continue Vital AF per PEG tube @ 280 ml, 4 times daily to provide 1344 kcal and 84 g protein.  - Free water flushes of 125 ml QID to provide additional 500 ml of free water (1407 ml/day total)  NUTRITION DIAGNOSIS: Inadequate oral intake related to inability to eat as evidenced by NPO; ongoing  Goal: Pt to meet >/= 90% of their estimated nutrition needs; met  Monitor:  TF tolerance, weight trends, labs, I/O's   80 y.o. female  Admitting Dx: Acute encephalopathy  ASSESSMENT: 80 y.o. female with Past medical history of diabetes mellitus, chronic kidney disease, hypertension, peripheral vascular disease, recent CVA with intraventricular hemorrhage status post drain placement and removal. The patient is presenting with complaint of confusion. She was brought in by her daughter from the nursing home. As per the daughter since last 3-4 days the patient has been having progressively worsening mental status.  Pt has been tolerating her bolus tube feeds per RN with little to no residuals (~100 mls). Will continue with current bolus TF recommendations. Weight has been stable. Will continue to monitor.  Height: Ht Readings from Last 1 Encounters:  10/09/14 _0  (1.499 m)    Weight: Wt Readings from Last 1 Encounters:  10/15/14 141 lb (63.957 kg)    BMI:  Body mass index is 28.46 kg/(m^2).  Re-Estimated Nutritional Needs: Kcal: 1300-1500 Protein: 75-90 g Fluid: 1.6 L/day  Skin: stage II pressure ulcer on coccyx, wound on buttocks, +2 generalized edema  Diet Order: Diet NPO time specified   Intake/Output Summary (Last 24 hours) at 10/15/14 0935 Last data filed at 10/15/14 0700  Gross per 24 hour  Intake   1280 ml  Output      0 ml  Net   1280 ml    Last BM: 11/24  Labs:   Recent Labs Lab 10/10/14 0642 10/13/14 0515 10/14/14 0557  NA 140 139 137  K 4.1 4.1 4.2  CL 102 101 101  CO2 _1 BUN 32* 33* 36*  CREATININE  1.25* 1.02 0.96  CALCIUM 8.8 8.6 8.5  GLUCOSE 122* 171* 144*    CBG (last 3)   Recent Labs  10/14/14 2358 10/15/14 0421 10/15/14 0752  GLUCAP 214* 103* 115*    Scheduled Meds: . allopurinol  100 mg Per Tube BID  . amiodarone  200 mg Per Tube Daily  . antiseptic oral rinse  7 mL Mouth Rinse q12n4p  . aspirin  325 mg Per Tube Daily  . atorvastatin  80 mg Per Tube q1800  . chlorhexidine  15 mL Mouth Rinse BID  . diltiazem  30 mg Oral 4 times per day  . enoxaparin (LOVENOX) injection  30 mg Subcutaneous Q24H  . feeding supplement (VITAL AF 1.2 CAL)  280 mL Per Tube QID  . free water  300 mL Per Tube Q4H  . insulin aspart  0-15 Units Subcutaneous 6 times per day  . latanoprost  1 drop Right Eye QHS  . meropenem (MERREM) IV  1 g Intravenous Q12H  . metoprolol tartrate  100 mg Per Tube BID  . pantoprazole sodium  40 mg Per Tube Daily  . sodium chloride  3 mL Intravenous Q12H    Continuous Infusions:   Past Medical History  Diagnosis Date  . Diabetes mellitus with diabetic polyneuropathy   . A-fib   . Renal disorder   . Hypertension   . Anemia   . Glaucoma  right eye   . Carotid artery stenosis   . PVD (peripheral vascular disease)     Past Surgical History  Procedure Laterality Date  . Replacement total knee Right   . Carotid endarterectomy  2015  . Coronary artery bypass graft    . Ablation      cardiac for arrthymia     Kallie Locks, MS, RD, LDN Pager # [phone] After hours/ weekend pager # [phone]

## 2014-10-16 LAB — GLUCOSE, CAPILLARY
GLUCOSE-CAPILLARY: 149 mg/dL — AB (ref 70–99)
Glucose-Capillary: 177 mg/dL — ABNORMAL HIGH (ref 70–99)
Glucose-Capillary: 139 mg/dL — ABNORMAL HIGH (ref 70–99)
Glucose-Capillary: 103 mg/dL — ABNORMAL HIGH (ref 70–99)
Glucose-Capillary: 118 mg/dL — ABNORMAL HIGH (ref 70–99)

## 2014-10-16 LAB — CBC
HCT: 32.9 % — ABNORMAL LOW (ref 36.0–46.0)
HEMOGLOBIN: 10.3 g/dL — AB (ref 12.0–15.0)
MCH: 30.2 pg (ref 26.0–34.0)
MCHC: 31.3 g/dL (ref 30.0–36.0)
MCV: 96.5 fL (ref 78.0–100.0)
PLATELETS: 241 10*3/uL (ref 150–400)
RBC: 3.41 MIL/uL — ABNORMAL LOW (ref 3.87–5.11)
RDW: 16.7 % — ABNORMAL HIGH (ref 11.5–15.5)
WBC: 11.2 10*3/uL — ABNORMAL HIGH (ref 4.0–10.5)

## 2014-10-16 LAB — COMPREHENSIVE METABOLIC PANEL
ALBUMIN: 2.3 g/dL — AB (ref 3.5–5.2)
ALT: 35 U/L (ref 0–35)
AST: 29 U/L (ref 0–37)
Alkaline Phosphatase: 93 U/L (ref 39–117)
Anion gap: 15 (ref 5–15)
BUN: 35 mg/dL — ABNORMAL HIGH (ref 6–23)
CALCIUM: 8.8 mg/dL (ref 8.4–10.5)
CO2: 22 mEq/L (ref 19–32)
Chloride: 102 mEq/L (ref 96–112)
Creatinine, Ser: 0.84 mg/dL (ref 0.50–1.10)
GFR calc Af Amer: 72 mL/min — ABNORMAL LOW (ref >90)
GFR calc non Af Amer: 62 mL/min — ABNORMAL LOW (ref >90)
GLUCOSE: 131 mg/dL — AB (ref 70–99)
POTASSIUM: 4.3 meq/L (ref 3.7–5.3)
SODIUM: 139 meq/L (ref 137–147)
Total Bilirubin: <0.2 mg/dL — ABNORMAL LOW (ref 0.3–1.2)
Total Protein: 6.9 g/dL (ref 6.0–8.3)

## 2014-10-16 LAB — FOLATE RBC: RBC Folate: 989 ng/mL — ABNORMAL HIGH (ref >280)

## 2014-10-16 MED ORDER — ENOXAPARIN SODIUM 40 MG/0.4ML ~~LOC~~ SOLN
40.0000 mg | SUBCUTANEOUS | Status: DC
  Administered 2014-10-17 – 2014-10-21 (×5): 40 mg via SUBCUTANEOUS
  Filled 2014-10-16 (×5): qty 0.4

## 2014-10-16 NOTE — Progress Notes (Signed)
PROGRESS NOTE  Susan Calhoun YNW:[id number] DOB: 01/17/1928 DOA: 10/08/2014 PCP: Pcp Not In System  Assessment/Plan: Acute encephalopathy -In the setting of recent IVH s/p drain --admitted with recent CVA with IVH, hydrocephalous and h/o temporary drain with fluctuating mental status then, went to SNF 11/10. -She had multiple EEGs including LTM done showing general slowing and frequent intermittent triphasic waves. She did have generalized tremors during hospital stay with no EEG correlate --MRI without acute changes -was on Cefepime for UTI, FU Urine Cx- with Klebsiella, resistant to Cephalosporins, hence changed to Meropenem 11/21 -unfortunately continues to be obtunded without any improvement -ammonia--14 -check RPR, B12, folate, am cortisol--pending -TSH 2.060 -10/16/2014--very little improvement. Patient opens eyes but does not follow commands AKI -resolved -Baseline creatinine 0.9-1.2 Dysphagia -in setting of recent IVH  -continue enteral feeds Atrial fibrillation -not an AC candidate -continue metoprolol tartrate and diltiazem -Continue amiodarone -presently in sinus UTI -Klebsiella pneumoniae -continue Merrem (started 11/21) Diarrhea -C.diff neg -likely due to enteral feeds Hypertension -BP has been labile but acceptable presently -Continue metoprolol tartrate and diltiazem   Family Communication:   Pt at beside Disposition Plan:   SNF on 10/17/14 if stable       Procedures/Studies: Ct Abdomen Wo Contrast  09/23/2014   CLINICAL DATA:  Malnutrition.  Anatomy for PEG tube placement.  EXAM: CT ABDOMEN WITHOUT CONTRAST  TECHNIQUE: Multidetector CT imaging of the abdomen was performed following the standard protocol without IV contrast.  COMPARISON:  None.  FINDINGS: LOWER CHEST: Extensive coronary atherosclerosis. Partially visualized aortic valve sclerosis/calcification.  Small and layering bilateral pleural effusions with passive atelectasis.   ABDOMEN/PELVIS:  Liver: No focal abnormality.  Biliary: Sludge or other layering high density within the gallbladder. No distention.  Pancreas: Unremarkable.  Spleen: Unremarkable.  Adrenals: Unremarkable.  Kidneys and ureters: No hydronephrosis.  Bowel: Feeding tube tip terminates in the proximal stomach. The non insufflated stomach minimally overlaps with the left lobe of the liver. The transverse colon is well inferior to the stomach. There is no evidence of gastric mass or displacement. No abdominal wall hernia or other impediment. No bowel obstruction.  Retroperitoneum: No mass or adenopathy.  Peritoneum: No ascites or pneumoperitoneum.  Vascular: Extensive atherosclerotic calcification.  OSSEOUS: No acute abnormalities.  IMPRESSION: 1. Normal gastric morphology and position. Surrounding anatomy described above. 2. Small pleural effusions with passive atelectasis.   Electronically Signed   By: Tiburcio PeaJonathan  Watts M.D.   On: 09/23/2014 10:05   Dg Chest 2 View  09/28/2014   CLINICAL DATA:  Fever, history of hypertension  EXAM: CHEST  2 VIEW  COMPARISON:  09/17/2014  FINDINGS: Evidence of CABG. Lungs are hypoaerated with crowding of the bronchovascular markings. Mild cardiomegaly reidentified with central vascular congestion but no overt edema. No new focal pulmonary opacity. Trace pleural effusions are noted.  IMPRESSION: Mild cardiomegaly with central vascular congestion and trace pleural fluid.   Electronically Signed   By: Christiana PellantGretchen  Green M.D.   On: 09/28/2014 15:45   Ct Head Wo Contrast  10/08/2014   CLINICAL DATA:  Increasing weakness and lethargy since Saturday, recent UTIs, personal history of diabetes, renal disorder, hypertension  EXAM: CT HEAD WITHOUT CONTRAST  TECHNIQUE: Contiguous axial images were obtained from the base of the skull through the vertex without intravenous contrast.  COMPARISON:  09/26/2014  FINDINGS: Minimal atrophy.  Mild prominence of the ventricular system little changed.  Small  vessel chronic ischemic changes of deep cerebral white matter.  Intraventricular blood identified at the occipital horns of the lateral ventricles on the previous exam no longer identified.  Focus of high attenuation at the RIGHT parietal lobe likely tiny calcification, unchanged since 09/04/2014 making this unlikely to represent blood.  No definite high attenuation acute intracranial hemorrhage, mass lesion or evidence acute infarction.  No extra-axial fluid collections.  Atherosclerotic calcifications at the carotid siphons.  Scattered motion artifacts.  Prior burr hole RIGHT frontal.  Hyperostosis frontalis interna.  No acute osseous findings.  IMPRESSION: Motion artifacts.  Atrophy with mild persistent prominence of the ventricular system and small vessel chronic ischemic changes.  Resolution of minimal intraventricular blood seen on the previous exam.   Electronically Signed   By: Ulyses SouthwardMark  Boles M.D.   On: 10/08/2014 22:13   Ct Head Wo Contrast  09/26/2014   CLINICAL DATA:  Progressive cognitive decline over the last 2 days.  EXAM: CT HEAD WITHOUT CONTRAST  TECHNIQUE: Contiguous axial images were obtained from the base of the skull through the vertex without intravenous contrast.  COMPARISON:  CT head without contrast 09/14/2014.  FINDINGS: Minimal blood products are still seen dependently within the posterior horns of the lateral ventricles. There is no new hemorrhage. Ventricular enlargement is similar to the prior study although the temporal horns may be slightly more prominent. No acute cortical infarct, hemorrhage, or other mass lesion is present. Mild mucosal thickening is present in the sphenoid sinuses bilaterally. The remaining paranasal sinuses and the mastoid air cells are clear.  IMPRESSION: 1. Near complete resolution of layering blood products in the posterior horn of the lateral ventricles bilaterally. 2. Stable ventricular enlargement. 3. No new hemorrhage. 4. Stable atrophy and moderate white  matter disease.   Electronically Signed   By: Gennette Pachris  Mattern M.D.   On: 09/26/2014 18:20   Mr Brain Wo Contrast  10/10/2014   CLINICAL DATA:  Encephalopathy. Intraventricular hemorrhage and drain placement last month.  EXAM: MRI HEAD WITHOUT CONTRAST  TECHNIQUE: Multiplanar, multiecho pulse sequences of the brain and surrounding structures were obtained without intravenous contrast.  COMPARISON:  Head CT 09/28/2014 and MRI 09/12/2014  FINDINGS: Images are mildly to moderately degraded by motion artifact.  No acute infarct is identified. There is a small amount of diffusion weighted signal abnormality along the prior right frontal ventriculostomy catheter tract likely secondary to prior intervention. No sizable intracranial hemorrhage is identified, although T2* images are particularly motion degraded. There may be minimal residual chronic blood products in the occipital horns.  The lateral and third ventricles remain moderately dilated but overall slightly improved compared to the prior MRI. Mild T2 hyperintensity in the periventricular white matter bilaterally does not appear significantly changed, nonspecific. No mass, midline shift, or extra-axial fluid collection is identified.  Prior bilateral cataract extraction is noted. There is mild right greater the left sphenoid sinus mucosal thickening. Mastoid air cells are clear. Major intracranial vascular flow voids are preserved.  IMPRESSION: 1. No evidence acute/new intracranial abnormality. 2. Slightly decreased ventricular dilatation since 09/12/2014 MRI.   Electronically Signed   By: Sebastian AcheAllen  Grady   On: 10/10/2014 12:22   Ir Gastrostomy Tube Mod Sed  09/23/2014   INDICATION: Patient admitted with intraventricular/subarachnoid hemorrhage with persistent altered mental status and now with dysphagia. Request made for percutaneous gastrostomy tube placement for enteric nutrition.  EXAM: PULL TROUGH GASTROSTOMY TUBE PLACEMENT  COMPARISON:  CT abdomen and pelvis  - 09/23/2014  MEDICATIONS: The patient is currently admitted to the hospital receiving intravenous antibiotics. The antibiotics were  administered within an appropriate time frame prior to initiation of the procedure.  CONTRAST:  15 mL of Isovue 300 administered into the gastric lumen.  ANESTHESIA/SEDATION: Versed 1 mg IV; Fentanyl 50 mcg IV  Sedation time  25 minutes  FLUOROSCOPY TIME:  4 minutes.  COMPLICATIONS: None immediate  PROCEDURE: Informed written consent was obtained from the patient's daughter following explanation of the procedure, risks, benefits and alternatives. A time out was performed prior to the initiation of the procedure. Ultrasound scanning was performed to demarcate the edge of the left lobe of the liver. Maximal barrier sterile technique utilized including caps, mask, sterile gowns, sterile gloves, large sterile drape, hand hygiene and Betadine prep.  The left upper quadrant was sterilely prepped and draped. An oral gastric catheter was inserted into the stomach under fluoroscopy. The existing nasogastric feeding tube was removed. The left costal margin and air opacified transverse colon were identified and avoided. Air was injected into the stomach for insufflation and visualization under fluoroscopy. Under sterile conditions a 17 gauge trocar needle was utilized to access the stomach percutaneously beneath the left subcostal margin after the overlying soft tissues were anesthetized with 1% Lidocaine with epinephrine. Needle position was confirmed within the stomach with aspiration of air and injection of small amount of contrast. A single T tack was deployed for gastropexy. Over an Amplatz guide wire, a 9-French sheath was inserted into the stomach. A snare device was utilized to capture the oral gastric catheter. The snare device was pulled retrograde from the stomach up the esophagus and out the oropharynx. The 20-French pull-through gastrostomy was connected to the snare device and  pulled antegrade through the oropharynx down the esophagus into the stomach and then through the percutaneous tract external to the patient. The gastrostomy was assembled externally. Contrast injection confirms position in the stomach. Several spot radiographic images were obtained in various obliquities for documentation. The patient tolerated procedure well without immediate post procedural complication.  FINDINGS: After successful fluoroscopic guided placement, the gastrostomy tube is appropriately positioned with internal disc against the ventral aspect of the gastric lumen.  IMPRESSION: Successful fluoroscopic insertion of a 20-French pull-through gastrostomy tube.  The gastrostomy may be used immediately for medication administration and in 24 hrs for the initiation of feeds.   Electronically Signed   By: Simonne Come M.D.   On: 09/23/2014 14:55   Dg Chest Port 1 View  10/08/2014   CLINICAL DATA:  Initial evaluation for weakness and lethargy for 3 days with altered mental status  EXAM: PORTABLE CHEST - 1 VIEW  COMPARISON:  09/30/2014  FINDINGS: The heart size and mediastinal contours are within normal limits. Both lungs are clear. The visualized skeletal structures are unremarkable.  IMPRESSION: No active disease.   Electronically Signed   By: Esperanza Heir M.D.   On: 10/08/2014 21:32   Dg Chest Port 1 View  09/30/2014   CLINICAL DATA:  Cough.  EXAM: PORTABLE CHEST - 1 VIEW  COMPARISON:  09/28/2014.  FINDINGS: Mediastinum hilar structures are normal. Prior CABG. Stable cardiomegaly. Interval improvement of pulmonary venous congestion. Mild bibasilar subsegmental atelectasis. Small left pleural effusion cannot be excluded. No pneumothorax. Degenerative changes thoracic spine and both shoulders.  IMPRESSION: 1. Prior CABG. Stable cardiomegaly. Interim resolution of pulmonary venous congestion. 2. Mild bibasilar atelectasis. Small left pleural effusion cannot be excluded.   Electronically Signed   By:  Maisie Fus  Register   On: 09/30/2014 07:23   Dg Chest Port 1 View  09/17/2014  CLINICAL DATA:  Congestive heart failure  EXAM: PORTABLE CHEST - 1 VIEW  COMPARISON:  09/14/2014  FINDINGS: Cardiomegaly again noted. Central vascular congestion without convincing pulmonary edema. Persistent small bilateral pleural effusion with bilateral basilar atelectasis or infiltrate. Status post median sternotomy. Stable NG tube and left IJ central line position.  IMPRESSION: Central vascular congestion without convincing pulmonary edema. Persistent small bilateral pleural effusion with bilateral basilar atelectasis or infiltrate. Status post median sternotomy. Stable NG tube and left IJ central line position.   Electronically Signed   By: Natasha Mead M.D.   On: 09/17/2014 09:25   Dg Abd Portable 1v  09/20/2014   CLINICAL DATA:  Feeding tube placement.  EXAM: PORTABLE ABDOMEN - 1 VIEW  COMPARISON:  09/09/14  FINDINGS: A feeding tube is now seen with the tip overlying the proximal body of the stomach. No evidence of dilated bowel loops.  IMPRESSION: Feeding tube tip overlies the proximal body of the stomach.   Electronically Signed   By: Myles Rosenthal M.D.   On: 09/20/2014 19:20         Subjective: Patient opens eyes to voice command but does not respond to any questions or follow commands. There is no reports of uncontrolled pain, shortness of breath, vomiting, diarrhea  Objective: Filed Vitals:   10/16/14 0020 10/16/14 0414 10/16/14 0634 10/16/14 0957  BP: 174/52 180/144 170/83 148/48  Pulse: 66 81 89 88  Temp:  99 F (37.2 C)  98.9 F (37.2 C)  TempSrc:    Oral  Resp:      Height:      Weight:      SpO2:  100%  100%    Intake/Output Summary (Last 24 hours) at 10/16/14 1310 Last data filed at 10/16/14 0900  Gross per 24 hour  Intake    200 ml  Output      0 ml  Net    200 ml   Weight change: 0.227 kg (8 oz) Exam:   General:  Pt is alert, follows commands appropriately, not in acute  distress  HEENT: No icterus, No thrush, No neck mass, [place]/AT  Cardiovascular: RRR, S1/S2, no rubs, no gallops  Respiratory: CTA bilaterally, no wheezing, no crackles, no rhonchi  Abdomen: Soft/+BS, non tender, non distended, no guarding  Extremities: No edema, No lymphangitis, No petechiae, No rashes, no synovitis  Data Reviewed: Basic Metabolic Panel:  Recent Labs Lab 10/10/14 0642 10/13/14 0515 10/14/14 0557 10/16/14 0504  NA 140 139 137 139  K 4.1 4.1 4.2 4.3  CL 102 101 101 102  CO2 23 22 20 22   GLUCOSE 122* 171* 144* 131*  BUN 32* 33* 36* 35*  CREATININE 1.25* 1.02 0.96 0.84  CALCIUM 8.8 8.6 8.5 8.8   Liver Function Tests:  Recent Labs Lab 10/16/14 0504  AST 29  ALT 35  ALKPHOS 93  BILITOT <0.2*  PROT 6.9  ALBUMIN 2.3*   No results for input(s): LIPASE, AMYLASE in the last 168 hours.  Recent Labs Lab 10/10/14 1915  AMMONIA 14   CBC:  Recent Labs Lab 10/10/14 0642 10/14/14 0557 10/16/14 0504  WBC 7.5 12.5* 11.2*  HGB 11.2* 10.5* 10.3*  HCT 36.6 33.6* 32.9*  MCV 95.8 94.1 96.5  PLT 271 255 241   Cardiac Enzymes: No results for input(s): CKTOTAL, CKMB, CKMBINDEX, TROPONINI in the last 168 hours. BNP: Invalid input(s): POCBNP CBG:  Recent Labs Lab 10/15/14 2003 10/16/14 0015 10/16/14 0407 10/16/14 0852 10/16/14 1248  GLUCAP 130* 177* 139*  103* 149*    Recent Results (from the past 240 hour(s))  Urine culture     Status: None   Collection Time: 10/08/14  8:56 PM  Result Value Ref Range Status   Specimen Description URINE, CATHETERIZED  Final   Special Requests NONE  Final   Culture  Setup Time   Final    10/09/2014 05:03 Performed at [facility] Count   Final    >=100,000 COLONIES/ML Performed at [facility]    Culture   Final    KLEBSIELLA PNEUMONIAE Note: MEROPENEM SENSITIVE <=0.25 Performed at [facility]    Report Status 10/13/2014 FINAL  Final   Organism ID, Bacteria KLEBSIELLA  PNEUMONIAE  Final      Susceptibility   Klebsiella pneumoniae - MIC*    AMPICILLIN >=32 RESISTANT Resistant     CEFAZOLIN >=64 RESISTANT Resistant     CEFTRIAXONE 32 INTERMEDIATE Intermediate     CIPROFLOXACIN <=0.25 SENSITIVE Sensitive     GENTAMICIN >=16 RESISTANT Resistant     LEVOFLOXACIN <=0.12 SENSITIVE Sensitive     NITROFURANTOIN 32 SENSITIVE Sensitive     TOBRAMYCIN 2 SENSITIVE Sensitive     TRIMETH/SULFA <=20 SENSITIVE Sensitive     PIP/TAZO 64 INTERMEDIATE Intermediate     * KLEBSIELLA PNEUMONIAE  MRSA PCR Screening     Status: None   Collection Time: 10/09/14  1:21 AM  Result Value Ref Range Status   MRSA by PCR NEGATIVE NEGATIVE Final    Comment:        The GeneXpert MRSA Assay (FDA approved for NASAL specimens only), is one component of a comprehensive MRSA colonization surveillance program. It is not intended to diagnose MRSA infection nor to guide or monitor treatment for MRSA infections.   Clostridium Difficile by PCR     Status: None   Collection Time: 10/14/14  8:20 PM  Result Value Ref Range Status   C difficile by pcr NEGATIVE NEGATIVE Final     Scheduled Meds: . allopurinol  100 mg Per Tube BID  . amiodarone  200 mg Per Tube Daily  . antiseptic oral rinse  7 mL Mouth Rinse q12n4p  . aspirin  325 mg Per Tube Daily  . atorvastatin  80 mg Per Tube q1800  . chlorhexidine  15 mL Mouth Rinse BID  . diltiazem  30 mg Oral 4 times per day  . enoxaparin (LOVENOX) injection  30 mg Subcutaneous Q24H  . feeding supplement (VITAL AF 1.2 CAL)  280 mL Per Tube QID  . free water  300 mL Per Tube Q4H  . insulin aspart  0-15 Units Subcutaneous 6 times per day  . latanoprost  1 drop Right Eye QHS  . meropenem (MERREM) IV  1 g Intravenous Q12H  . metoprolol tartrate  100 mg Per Tube BID  . pantoprazole sodium  40 mg Per Tube Daily  . sodium chloride  3 mL Intravenous Q12H   Continuous Infusions:    Korrin Waterfield, DO  [facility] Pager [phone]  If  7PM-7AM, please contact night-coverage [email or web address] Password [email or web address] 10/16/2014, 1:10 PM   LOS: 8 days

## 2014-10-17 DIAGNOSIS — R1314 Dysphagia, pharyngoesophageal phase: Secondary | ICD-10-CM

## 2014-10-17 LAB — VITAMIN B12: VITAMIN B 12: 1173 pg/mL — AB (ref 211–911)

## 2014-10-17 LAB — GLUCOSE, CAPILLARY
Glucose-Capillary: 151 mg/dL — ABNORMAL HIGH (ref 70–99)
Glucose-Capillary: 157 mg/dL — ABNORMAL HIGH (ref 70–99)
Glucose-Capillary: 101 mg/dL — ABNORMAL HIGH (ref 70–99)
Glucose-Capillary: 117 mg/dL — ABNORMAL HIGH (ref 70–99)
Glucose-Capillary: 136 mg/dL — ABNORMAL HIGH (ref 70–99)
Glucose-Capillary: 135 mg/dL — ABNORMAL HIGH (ref 70–99)

## 2014-10-17 LAB — CORTISOL-AM, BLOOD: Cortisol - AM: 17.2 ug/dL (ref 4.3–22.4)

## 2014-10-17 LAB — RPR

## 2014-10-17 LAB — FOLATE: FOLATE: 19.3 ng/mL

## 2014-10-17 MED ORDER — AMANTADINE HCL 100 MG PO CAPS: 100.0000 mg | ORAL_CAPSULE | Freq: Two times a day (BID) | ORAL | Status: DC

## 2014-10-17 MED ORDER — AMANTADINE HCL 50 MG/5ML PO SYRP
100.0000 mg | ORAL_SOLUTION | Freq: Two times a day (BID) | ORAL | Status: DC
  Administered 2014-10-17 – 2014-10-21 (×8): 100 mg
  Filled 2014-10-17 (×9): qty 10

## 2014-10-17 NOTE — Progress Notes (Addendum)
PT Cancellation Note and Discharge  Patient Details Name: Susan Calhoun MRN: [id number] DOB: 12/01/1927   Cancelled Treatment:    Reason Eval/Treat Not Completed: Patient's level of consciousness.   Pt only aroused to family member's voice and strong stimuli. Pt will not open eyes to therapist or follow commands at this time. Discussed pt case with RN, who states there has been very minimal improvement in level of consciousness, and that she continues to be inappropriate for therapy services. Will sign off at this time. If needs change, please reconsult.    Conni SlipperKirkman, Aalivia Mcgraw 10/17/2014, 3:57 PM   Conni SlipperLaura Shareeka Yim, PT, DPT Acute Rehabilitation Services Pager: [phone]

## 2014-10-17 NOTE — Care Management Note (Signed)
CARE MANAGEMENT NOTE 10/17/2014  Patient:  Susan Calhoun,Susan Calhoun   Account Number:  [id number]  Date Initiated:  10/10/2014  Documentation initiated by:  Wilmar Prabhakar  Subjective/Objective Assessment:   CM following for progression and d/c planning.     Action/Plan:   10/10/2014 Pt is SNF resident and plan if for her to return to SNF for rehab.  10/18/2015 Still with significant decreased LOC, plan for SNF vs residentlial Hospice.   Anticipated DC Date:  10/19/2014   Anticipated DC Plan:  SKILLED NURSING FACILITY         Choice offered to / List presented to:             Status of service:  Completed, signed off Medicare Important Message given?  YES (If response is "NO", the following Medicare IM given date fields will be blank) Date Medicare IM given:  10/10/2014 Medicare IM given by:  Shelie Lansing Date Additional Medicare IM given:  10/17/2014 Additional Medicare IM given by:  [facility] Sie[facility]  Discharge Disposition:    Per UR Regulation:    If discussed at Long Length of Stay Meetings, dates discussed:    Comments:

## 2014-10-17 NOTE — Progress Notes (Signed)
PROGRESS NOTE  Susan Calhoun WUJ:[id number] DOB: 07-05-1928 DOA: 10/08/2014 PCP: Pcp Not In System  Assessment/Plan: Acute encephalopathy -In the setting of recent IVH s/p drain--October 2015 --admitted with recent CVA with IVH, hydrocephalous and h/o temporary drain with fluctuating mental status then, went to SNF 11/10. -She had multiple EEGs including LTM done showing general slowing and frequent intermittent triphasic waves. She did have generalized tremors during hospital stay with no EEG correlate --MRI brain without acute changes -was on Cefepime for UTI, FU Urine Cx- with Klebsiella, resistant to Cephalosporins, hence changed to Meropenem 11/21 -unfortunately continues to be encephalopathic with minimal improvement -ammonia--14 -check RPR, B12, folate, am cortisol--pending -TSH 2.060 -10/16/2014--very little improvement. Patient opens eyes but does not follow commands  -palliative medicine was consulted -10/17/14--as the patient's family continued to struggle with the patient's decline--patient's family has requested to speak with the neurologist -10/17/2014--Reconsulted neurology--case was discussed with Dr. Leroy Kennedy AKI -resolved -Baseline creatinine 0.9-1.2 Dysphagia -in setting of recent IVH   -continue enteral feeds--tolerating well Atrial fibrillation -not an AC candidate -continue metoprolol tartrate and diltiazem -Continue amiodarone -presently in sinus UTI -Klebsiella pneumoniae -continue Merrem (started 11/21) Diarrhea -C.diff neg -likely due to enteral feeds Hypertension -BP has been labile but acceptable presently -Continue metoprolol tartrate and diltiazem     Family Communication:   Family updated at beside, total time 35 min, >50% spent counseling and coordinating care Disposition Plan:   SNF       Procedures/Studies: Ct Abdomen Wo Contrast  2014-10-05   CLINICAL DATA:  Malnutrition.  Anatomy for PEG tube placement.  EXAM: CT  ABDOMEN WITHOUT CONTRAST  TECHNIQUE: Multidetector CT imaging of the abdomen was performed following the standard protocol without IV contrast.  COMPARISON:  None.  FINDINGS: LOWER CHEST: Extensive coronary atherosclerosis. Partially visualized aortic valve sclerosis/calcification.  Small and layering bilateral pleural effusions with passive atelectasis.  ABDOMEN/PELVIS:  Liver: No focal abnormality.  Biliary: Sludge or other layering high density within the gallbladder. No distention.  Pancreas: Unremarkable.  Spleen: Unremarkable.  Adrenals: Unremarkable.  Kidneys and ureters: No hydronephrosis.  Bowel: Feeding tube tip terminates in the proximal stomach. The non insufflated stomach minimally overlaps with the left lobe of the liver. The transverse colon is well inferior to the stomach. There is no evidence of gastric mass or displacement. No abdominal wall hernia or other impediment. No bowel obstruction.  Retroperitoneum: No mass or adenopathy.  Peritoneum: No ascites or pneumoperitoneum.  Vascular: Extensive atherosclerotic calcification.  OSSEOUS: No acute abnormalities.  IMPRESSION: 1. Normal gastric morphology and position. Surrounding anatomy described above. 2. Small pleural effusions with passive atelectasis.   Electronically Signed   By: Tiburcio Pea M.D.   On: 10-05-2014 10:05   Dg Chest 2 View  09/28/2014   CLINICAL DATA:  Fever, history of hypertension  EXAM: CHEST  2 VIEW  COMPARISON:  09/17/2014  FINDINGS: Evidence of CABG. Lungs are hypoaerated with crowding of the bronchovascular markings. Mild cardiomegaly reidentified with central vascular congestion but no overt edema. No new focal pulmonary opacity. Trace pleural effusions are noted.  IMPRESSION: Mild cardiomegaly with central vascular congestion and trace pleural fluid.   Electronically Signed   By: Christiana Pellant M.D.   On: 09/28/2014 15:45   Ct Head Wo Contrast  10/08/2014   CLINICAL DATA:  Increasing weakness and lethargy since  Saturday, recent UTIs, personal history of diabetes, renal disorder, hypertension  EXAM: CT HEAD WITHOUT CONTRAST  TECHNIQUE: Contiguous  axial images were obtained from the base of the skull through the vertex without intravenous contrast.  COMPARISON:  09/26/2014  FINDINGS: Minimal atrophy.  Mild prominence of the ventricular system little changed.  Small vessel chronic ischemic changes of deep cerebral white matter.  Intraventricular blood identified at the occipital horns of the lateral ventricles on the previous exam no longer identified.  Focus of high attenuation at the RIGHT parietal lobe likely tiny calcification, unchanged since 09/04/2014 making this unlikely to represent blood.  No definite high attenuation acute intracranial hemorrhage, mass lesion or evidence acute infarction.  No extra-axial fluid collections.  Atherosclerotic calcifications at the carotid siphons.  Scattered motion artifacts.  Prior burr hole RIGHT frontal.  Hyperostosis frontalis interna.  No acute osseous findings.  IMPRESSION: Motion artifacts.  Atrophy with mild persistent prominence of the ventricular system and small vessel chronic ischemic changes.  Resolution of minimal intraventricular blood seen on the previous exam.   Electronically Signed   By: Ulyses SouthwardMark  Boles M.D.   On: 10/08/2014 22:13   Ct Head Wo Contrast  09/26/2014   CLINICAL DATA:  Progressive cognitive decline over the last 2 days.  EXAM: CT HEAD WITHOUT CONTRAST  TECHNIQUE: Contiguous axial images were obtained from the base of the skull through the vertex without intravenous contrast.  COMPARISON:  CT head without contrast 09/14/2014.  FINDINGS: Minimal blood products are still seen dependently within the posterior horns of the lateral ventricles. There is no new hemorrhage. Ventricular enlargement is similar to the prior study although the temporal horns may be slightly more prominent. No acute cortical infarct, hemorrhage, or other mass lesion is present. Mild  mucosal thickening is present in the sphenoid sinuses bilaterally. The remaining paranasal sinuses and the mastoid air cells are clear.  IMPRESSION: 1. Near complete resolution of layering blood products in the posterior horn of the lateral ventricles bilaterally. 2. Stable ventricular enlargement. 3. No new hemorrhage. 4. Stable atrophy and moderate white matter disease.   Electronically Signed   By: Gennette Pachris  Mattern M.D.   On: 09/26/2014 18:20   Mr Brain Wo Contrast  10/10/2014   CLINICAL DATA:  Encephalopathy. Intraventricular hemorrhage and drain placement last month.  EXAM: MRI HEAD WITHOUT CONTRAST  TECHNIQUE: Multiplanar, multiecho pulse sequences of the brain and surrounding structures were obtained without intravenous contrast.  COMPARISON:  Head CT 09/28/2014 and MRI 09/12/2014  FINDINGS: Images are mildly to moderately degraded by motion artifact.  No acute infarct is identified. There is a small amount of diffusion weighted signal abnormality along the prior right frontal ventriculostomy catheter tract likely secondary to prior intervention. No sizable intracranial hemorrhage is identified, although T2* images are particularly motion degraded. There may be minimal residual chronic blood products in the occipital horns.  The lateral and third ventricles remain moderately dilated but overall slightly improved compared to the prior MRI. Mild T2 hyperintensity in the periventricular white matter bilaterally does not appear significantly changed, nonspecific. No mass, midline shift, or extra-axial fluid collection is identified.  Prior bilateral cataract extraction is noted. There is mild right greater the left sphenoid sinus mucosal thickening. Mastoid air cells are clear. Major intracranial vascular flow voids are preserved.  IMPRESSION: 1. No evidence acute/new intracranial abnormality. 2. Slightly decreased ventricular dilatation since 09/12/2014 MRI.   Electronically Signed   By: Sebastian AcheAllen  Grady   On:  10/10/2014 12:22   Ir Gastrostomy Tube Mod Sed  09/23/2014   INDICATION: Patient admitted with intraventricular/subarachnoid hemorrhage with persistent altered mental status and  now with dysphagia. Request made for percutaneous gastrostomy tube placement for enteric nutrition.  EXAM: PULL TROUGH GASTROSTOMY TUBE PLACEMENT  COMPARISON:  CT abdomen and pelvis - 09/23/2014  MEDICATIONS: The patient is currently admitted to the hospital receiving intravenous antibiotics. The antibiotics were administered within an appropriate time frame prior to initiation of the procedure.  CONTRAST:  15 mL of Isovue 300 administered into the gastric lumen.  ANESTHESIA/SEDATION: Versed 1 mg IV; Fentanyl 50 mcg IV  Sedation time  25 minutes  FLUOROSCOPY TIME:  4 minutes.  COMPLICATIONS: None immediate  PROCEDURE: Informed written consent was obtained from the patient's daughter following explanation of the procedure, risks, benefits and alternatives. A time out was performed prior to the initiation of the procedure. Ultrasound scanning was performed to demarcate the edge of the left lobe of the liver. Maximal barrier sterile technique utilized including caps, mask, sterile gowns, sterile gloves, large sterile drape, hand hygiene and Betadine prep.  The left upper quadrant was sterilely prepped and draped. An oral gastric catheter was inserted into the stomach under fluoroscopy. The existing nasogastric feeding tube was removed. The left costal margin and air opacified transverse colon were identified and avoided. Air was injected into the stomach for insufflation and visualization under fluoroscopy. Under sterile conditions a 17 gauge trocar needle was utilized to access the stomach percutaneously beneath the left subcostal margin after the overlying soft tissues were anesthetized with 1% Lidocaine with epinephrine. Needle position was confirmed within the stomach with aspiration of air and injection of small amount of contrast. A  single T tack was deployed for gastropexy. Over an Amplatz guide wire, a 9-French sheath was inserted into the stomach. A snare device was utilized to capture the oral gastric catheter. The snare device was pulled retrograde from the stomach up the esophagus and out the oropharynx. The 20-French pull-through gastrostomy was connected to the snare device and pulled antegrade through the oropharynx down the esophagus into the stomach and then through the percutaneous tract external to the patient. The gastrostomy was assembled externally. Contrast injection confirms position in the stomach. Several spot radiographic images were obtained in various obliquities for documentation. The patient tolerated procedure well without immediate post procedural complication.  FINDINGS: After successful fluoroscopic guided placement, the gastrostomy tube is appropriately positioned with internal disc against the ventral aspect of the gastric lumen.  IMPRESSION: Successful fluoroscopic insertion of a 20-French pull-through gastrostomy tube.  The gastrostomy may be used immediately for medication administration and in 24 hrs for the initiation of feeds.   Electronically Signed   By: Simonne ComeJohn  Watts M.D.   On: 09/23/2014 14:55   Dg Chest Port 1 View  10/08/2014   CLINICAL DATA:  Initial evaluation for weakness and lethargy for 3 days with altered mental status  EXAM: PORTABLE CHEST - 1 VIEW  COMPARISON:  09/30/2014  FINDINGS: The heart size and mediastinal contours are within normal limits. Both lungs are clear. The visualized skeletal structures are unremarkable.  IMPRESSION: No active disease.   Electronically Signed   By: Esperanza Heiraymond  Rubner M.D.   On: 10/08/2014 21:32   Dg Chest Port 1 View  09/30/2014   CLINICAL DATA:  Cough.  EXAM: PORTABLE CHEST - 1 VIEW  COMPARISON:  09/28/2014.  FINDINGS: Mediastinum hilar structures are normal. Prior CABG. Stable cardiomegaly. Interval improvement of pulmonary venous congestion. Mild  bibasilar subsegmental atelectasis. Small left pleural effusion cannot be excluded. No pneumothorax. Degenerative changes thoracic spine and both shoulders.  IMPRESSION: 1. Prior CABG.  Stable cardiomegaly. Interim resolution of pulmonary venous congestion. 2. Mild bibasilar atelectasis. Small left pleural effusion cannot be excluded.   Electronically Signed   By: Maisie Fus  Register   On: 09/30/2014 07:23   Dg Abd Portable 1v  09/20/2014   CLINICAL DATA:  Feeding tube placement.  EXAM: PORTABLE ABDOMEN - 1 VIEW  COMPARISON:  09/09/14  FINDINGS: A feeding tube is now seen with the tip overlying the proximal body of the stomach. No evidence of dilated bowel loops.  IMPRESSION: Feeding tube tip overlies the proximal body of the stomach.   Electronically Signed   By: Myles Rosenthal M.D.   On: 09/20/2014 19:20         Subjective: Patient opens eyes to verbal stimuli, but does not answer any questions or follow any commands. There is no reported respiratory distress, vomiting, diarrhea, uncontrolled pain. She is tolerating her tube feeds.  Objective: Filed Vitals:   10/16/14 1700 10/16/14 2200 10/17/14 0424 10/17/14 0936  BP: 134/48 122/51 126/46 158/97  Pulse: 73 75 81   Temp: 98.5 F (36.9 C) 98.2 F (36.8 C) 98.3 F (36.8 C) 99.2 F (37.3 C)  TempSrc: Oral Oral Oral Oral  Resp:  18 19 18   Height:      Weight:  66.044 kg (145 lb 9.6 oz)    SpO2: 98% 99% 97% 99%    Intake/Output Summary (Last 24 hours) at 10/17/14 1440 Last data filed at 10/17/14 0600  Gross per 24 hour  Intake   1140 ml  Output      0 ml  Net   1140 ml   Weight change: 1.86 kg (4 lb 1.6 oz) Exam:   General:  Pt is alert, follows commands appropriately, not in acute distress  HEENT: No icterus, No thrush, No meningismus, [place]/AT  Cardiovascular: RRR, S1/S2, no rubs, no gallops  Respiratory: Poor inspiratory effort but clear to auscultation  Abdomen: Soft/+BS, non tender, non distended, no  guarding  Extremities: No edema, No lymphangitis, No petechiae, No rashes, no synovitis  Data Reviewed: Basic Metabolic Panel:  Recent Labs Lab 10/13/14 0515 10/14/14 0557 10/16/14 0504  NA 139 137 139  K 4.1 4.2 4.3  CL 101 101 102  CO2 22 20 22   GLUCOSE 171* 144* 131*  BUN 33* 36* 35*  CREATININE 1.02 0.96 0.84  CALCIUM 8.6 8.5 8.8   Liver Function Tests:  Recent Labs Lab 10/16/14 0504  AST 29  ALT 35  ALKPHOS 93  BILITOT <0.2*  PROT 6.9  ALBUMIN 2.3*   No results for input(s): LIPASE, AMYLASE in the last 168 hours.  Recent Labs Lab 10/10/14 1915  AMMONIA 14   CBC:  Recent Labs Lab 10/14/14 0557 10/16/14 0504  WBC 12.5* 11.2*  HGB 10.5* 10.3*  HCT 33.6* 32.9*  MCV 94.1 96.5  PLT 255 241   Cardiac Enzymes: No results for input(s): CKTOTAL, CKMB, CKMBINDEX, TROPONINI in the last 168 hours. BNP: Invalid input(s): POCBNP CBG:  Recent Labs Lab 10/16/14 1638 10/16/14 2313 10/17/14 0416 10/17/14 0832 10/17/14 1235  GLUCAP 118* 151* 157* 101* 117*    Recent Results (from the past 240 hour(s))  Urine culture     Status: None   Collection Time: 10/08/14  8:56 PM  Result Value Ref Range Status   Specimen Description URINE, CATHETERIZED  Final   Special Requests NONE  Final   Culture  Setup Time   Final    10/09/2014 05:03 Performed at [facility]  Colony Count   Final    >=100,000 COLONIES/ML Performed at [facility]    Culture   Final    KLEBSIELLA PNEUMONIAE Note: MEROPENEM SENSITIVE <=0.25 Performed at [facility]    Report Status 10/13/2014 FINAL  Final   Organism ID, Bacteria KLEBSIELLA PNEUMONIAE  Final      Susceptibility   Klebsiella pneumoniae - MIC*    AMPICILLIN >=32 RESISTANT Resistant     CEFAZOLIN >=64 RESISTANT Resistant     CEFTRIAXONE 32 INTERMEDIATE Intermediate     CIPROFLOXACIN <=0.25 SENSITIVE Sensitive     GENTAMICIN >=16 RESISTANT Resistant     LEVOFLOXACIN <=0.12 SENSITIVE  Sensitive     NITROFURANTOIN 32 SENSITIVE Sensitive     TOBRAMYCIN 2 SENSITIVE Sensitive     TRIMETH/SULFA <=20 SENSITIVE Sensitive     PIP/TAZO 64 INTERMEDIATE Intermediate     * KLEBSIELLA PNEUMONIAE  MRSA PCR Screening     Status: None   Collection Time: 10/09/14  1:21 AM  Result Value Ref Range Status   MRSA by PCR NEGATIVE NEGATIVE Final    Comment:        The GeneXpert MRSA Assay (FDA approved for NASAL specimens only), is one component of a comprehensive MRSA colonization surveillance program. It is not intended to diagnose MRSA infection nor to guide or monitor treatment for MRSA infections.   Clostridium Difficile by PCR     Status: None   Collection Time: 10/14/14  8:20 PM  Result Value Ref Range Status   C difficile by pcr NEGATIVE NEGATIVE Final     Scheduled Meds: . allopurinol  100 mg Per Tube BID  . amiodarone  200 mg Per Tube Daily  . antiseptic oral rinse  7 mL Mouth Rinse q12n4p  . aspirin  325 mg Per Tube Daily  . atorvastatin  80 mg Per Tube q1800  . chlorhexidine  15 mL Mouth Rinse BID  . diltiazem  30 mg Oral 4 times per day  . enoxaparin (LOVENOX) injection  40 mg Subcutaneous Q24H  . feeding supplement (VITAL AF 1.2 CAL)  280 mL Per Tube QID  . free water  300 mL Per Tube Q4H  . insulin aspart  0-15 Units Subcutaneous 6 times per day  . latanoprost  1 drop Right Eye QHS  . meropenem (MERREM) IV  1 g Intravenous Q12H  . metoprolol tartrate  100 mg Per Tube BID  . pantoprazole sodium  40 mg Per Tube Daily  . sodium chloride  3 mL Intravenous Q12H   Continuous Infusions:    Azie Mcconahy, DO  [facility] Pager [phone]  If 7PM-7AM, please contact night-coverage [email or web address] Password [email or web address] 10/17/2014, 2:40 PM   LOS: 9 days

## 2014-10-17 NOTE — Progress Notes (Signed)
ANTIBIOTIC CONSULT NOTE - FOLLOW UP  Pharmacy Consult for Meropenem Indication: UTI  Allergies  Allergen Reactions  . Septra [Sulfamethoxazole-Trimethoprim] Hives    Patient Measurements: Height: 4\' 11"  (149.9 cm) Weight: 145 lb 9.6 oz (66.044 kg) IBW/kg (Calculated) : 43.2  Vital Signs: Temp: 99.2 F (37.3 C) (11/27 0936) Temp Source: Oral (11/27 0936) BP: 158/97 mmHg (11/27 0936) Pulse Rate: 81 (11/27 0424) Intake/Output from previous day: 11/26 0701 - 11/27 0700 In: 1140 [NG/GT:900; IV Piggyback:200] Out: -  Intake/Output from this shift:    Labs:  Recent Labs  10/16/14 0504  WBC 11.2*  HGB 10.3*  PLT 241  CREATININE 0.84   Estimated Creatinine Clearance: 41.2 mL/min (by C-G formula based on Cr of 0.84). No results for input(s): VANCOTROUGH, VANCOPEAK, VANCORANDOM, GENTTROUGH, GENTPEAK, GENTRANDOM, TOBRATROUGH, TOBRAPEAK, TOBRARND, AMIKACINPEAK, AMIKACINTROU, AMIKACIN in the last 72 hours.   Microbiology: Recent Results (from the past 720 hour(s))  Clostridium Difficile by PCR     Status: None   Collection Time: 09/17/14  2:42 PM  Result Value Ref Range Status   C difficile by pcr NEGATIVE NEGATIVE Final  Clostridium Difficile by PCR     Status: None   Collection Time: 09/22/14  5:30 PM  Result Value Ref Range Status   C difficile by pcr NEGATIVE NEGATIVE Final  Urine culture     Status: None   Collection Time: 10/08/14  8:56 PM  Result Value Ref Range Status   Specimen Description URINE, CATHETERIZED  Final   Special Requests NONE  Final   Culture  Setup Time   Final    10/09/2014 05:03 Performed at [facility]    Colony Count   Final    >=100,000 COLONIES/ML [facility]formed at [facility]    Culture   Final    KLEBSIELLA PNEUMONIAE Note: MEROPENEM SENSITIVE <=0.25 Performed at [facility]    Report Status 10/13/2014 FINAL  Final   Organism ID, Bacteria KLEBSIELLA PNEUMONIAE  Final      Susceptibility   Klebsiella  pneumoniae - MIC*    AMPICILLIN >=32 RESISTANT Resistant     CEFAZOLIN >=64 RESISTANT Resistant     CEFTRIAXONE 32 INTERMEDIATE Intermediate     CIPROFLOXACIN <=0.25 SENSITIVE Sensitive     GENTAMICIN >=16 RESISTANT Resistant     LEVOFLOXACIN <=0.12 SENSITIVE Sensitive     NITROFURANTOIN 32 SENSITIVE Sensitive     TOBRAMYCIN 2 SENSITIVE Sensitive     TRIMETH/SULFA <=20 SENSITIVE Sensitive     PIP/TAZO 64 INTERMEDIATE Intermediate     * KLEBSIELLA PNEUMONIAE  MRSA PCR Screening     Status: None   Collection Time: 10/09/14  1:21 AM  Result Value Ref Range Status   MRSA by PCR NEGATIVE NEGATIVE Final    Comment:        The GeneXpert MRSA Assay (FDA approved for NASAL specimens only), is one component of a comprehensive MRSA colonization surveillance program. It is not intended to diagnose MRSA infection nor to guide or monitor treatment for MRSA infections.   Clostridium Difficile by PCR     Status: None   Collection Time: 10/14/14  8:20 PM  Result Value Ref Range Status   C difficile by pcr NEGATIVE NEGATIVE Final    Anti-infectives    Start     Dose/Rate Route Frequency Ordered Stop   10/11/14 2000  meropenem (MERREM) 1 g in sodium chloride 0.9 % 100 mL IVPB     1 g200 mL/hr over 30 Minutes Intravenous Every  12 hours 10/11/14 1942     10/09/14 0230  ceFEPIme (MAXIPIME) 1 g in dextrose 5 % 50 mL IVPB  Status:  Discontinued     1 g100 mL/hr over 30 Minutes Intravenous Every 24 hours 10/09/14 0228 10/11/14 1901   10/08/14 2215  cefTRIAXone (ROCEPHIN) 1 g in dextrose 5 % 50 mL IVPB     1 g100 mL/hr over 30 Minutes Intravenous  Once 10/08/14 2209 10/08/14 2259      Assessment: 86 yoF on Meropenem Day 6 for Klebsiella UTI sensitive to cipro, levaquin, tobra, and septra. Patient remains afebrile, WBC trending down to 11.2. Per notes, she remains very lethargic, possibly due to infection, but may also be secondary to recent IVH.  SCr remains stable at 0.84. Per Dr. Arbutus Leasat, will  likely transition to oral FQ on 11/28.  Of note, he plans for 7 days of therapy (Today is Day 6) - so transition may not be necessary.  Goal of Therapy:  Resolution of infection and clinical improvement  Plan:  1) Continue Meropenem 1g q12h - Plan for 7 days of therapy 2) Monitor renal function and clinical status  Russ HaloAshley Amila Callies, PharmD Clinical Pharmacist - Resident Pager: [phone] 11/27/20151:18 PM

## 2014-10-17 NOTE — Clinical Social Work Note (Signed)
CSW continuing to monitor patient's progress and [facility]  Genelle BalVanessa Trason [place], MSW, LCSW [phone]

## 2014-10-18 ENCOUNTER — Inpatient Hospital Stay (HOSPITAL_COMMUNITY): Payer: Medicare (Managed Care)

## 2014-10-18 LAB — COMPREHENSIVE METABOLIC PANEL
ALK PHOS: 86 U/L (ref 39–117)
ALT: 27 U/L (ref 0–35)
AST: 24 U/L (ref 0–37)
Albumin: 2.3 g/dL — ABNORMAL LOW (ref 3.5–5.2)
Anion gap: 14 (ref 5–15)
BILIRUBIN TOTAL: 0.2 mg/dL — AB (ref 0.3–1.2)
BUN: 34 mg/dL — ABNORMAL HIGH (ref 6–23)
CHLORIDE: 100 meq/L (ref 96–112)
CO2: 24 meq/L (ref 19–32)
Calcium: 9 mg/dL (ref 8.4–10.5)
Creatinine, Ser: 0.79 mg/dL (ref 0.50–1.10)
GFR calc Af Amer: 86 mL/min — ABNORMAL LOW (ref >90)
GFR calc non Af Amer: 74 mL/min — ABNORMAL LOW (ref >90)
Glucose, Bld: 93 mg/dL (ref 70–99)
POTASSIUM: 4.5 meq/L (ref 3.7–5.3)
Sodium: 138 mEq/L (ref 137–147)
Total Protein: 6.7 g/dL (ref 6.0–8.3)

## 2014-10-18 LAB — CBC
HCT: 31.5 % — ABNORMAL LOW (ref 36.0–46.0)
Hemoglobin: 10.1 g/dL — ABNORMAL LOW (ref 12.0–15.0)
MCH: 30.4 pg (ref 26.0–34.0)
MCHC: 32.1 g/dL (ref 30.0–36.0)
MCV: 94.9 fL (ref 78.0–100.0)
Platelets: 218 10*3/uL (ref 150–400)
RBC: 3.32 MIL/uL — ABNORMAL LOW (ref 3.87–5.11)
RDW: 16.7 % — AB (ref 11.5–15.5)
WBC: 10.6 10*3/uL — ABNORMAL HIGH (ref 4.0–10.5)

## 2014-10-18 LAB — GLUCOSE, CAPILLARY
GLUCOSE-CAPILLARY: 136 mg/dL — AB (ref 70–99)
GLUCOSE-CAPILLARY: 153 mg/dL — AB (ref 70–99)
Glucose-Capillary: 169 mg/dL — ABNORMAL HIGH (ref 70–99)
Glucose-Capillary: 102 mg/dL — ABNORMAL HIGH (ref 70–99)
Glucose-Capillary: 88 mg/dL (ref 70–99)
Glucose-Capillary: 88 mg/dL (ref 70–99)

## 2014-10-18 MED ORDER — METOCLOPRAMIDE HCL 5 MG/ML IJ SOLN
5.0000 mg | Freq: Three times a day (TID) | INTRAMUSCULAR | Status: DC
  Administered 2014-10-18 – 2014-10-21 (×9): 5 mg via INTRAVENOUS
  Filled 2014-10-18: qty 2
  Filled 2014-10-18 (×2): qty 1
  Filled 2014-10-18 (×2): qty 2
  Filled 2014-10-18 (×2): qty 1
  Filled 2014-10-18 (×2): qty 2
  Filled 2014-10-18 (×2): qty 1
  Filled 2014-10-18: qty 2

## 2014-10-18 NOTE — Progress Notes (Signed)
Notified Tat MD of patient's abdominal distention. No pain noted upon palpation. Will continue to monitor. [name], Kathalene Sporer J

## 2014-10-18 NOTE — Progress Notes (Signed)
NEURO HOSPITALIST PROGRESS NOTE   SUBJECTIVE:                                                                                                                        Open eyes and reacts to painful stimuli but doesn't follow commands. Trial of amantadine started yesterday, as she reportedly did have a good response to this medication before. She is off benzos, SSRI's, or antipsychotics.  Labs pending today.   OBJECTIVE:                                                                                                                           Vital signs in last 24 hours: Temp:  [98 F (36.7 C)-99.2 F (37.3 C)] 98.9 F (37.2 C) (11/28 0442) Pulse Rate:  [70-83] 83 (11/28 0442) Resp:  [17-18] 18 (11/28 0442) BP: (104-187)/(39-97) 187/69 mmHg (11/28 0442) SpO2:  [99 %-100 %] 100 % (11/28 0442) Weight:  [66.134 kg (145 lb 12.8 oz)] 66.134 kg (145 lb 12.8 oz) (11/27 2001)  Intake/Output from previous day: 11/27 0701 - 11/28 0700 In: 880 [NG/GT:880] Out: 2 [Urine:2] Intake/Output this shift:   Nutritional status: Diet NPO time specified  Past Medical History  Diagnosis Date  . Diabetes mellitus with diabetic polyneuropathy   . A-fib   . Renal disorder   . Hypertension   . Anemia   . Glaucoma     right eye   . Carotid artery stenosis   . PVD (peripheral vascular disease)     Physical exam: awake, no apparent distress. Head: normocephalic. Neck: supple, no bruits, no JVD. Cardiac: no murmurs. Lungs: clear. Abdomen: soft, no tender, no mass. Extremities: no edema.  Neurologic Exam:  General: Mental Status: Awake but doesn't follow commands. II: Discs flat bilaterally; Visual fields grossly normal, pupils unequal (old finding s/p surgery) reactive to light III,IV, VI: ptosis not present, extra-ocular motions intact bilaterally V,VII: smile symmetric, facial light touch sensation unreliable on testing. VIII: hearing seems to be  grossly normal bilaterally IX,X: gag reflex present XI: bilateral shoulder shrug no tested XII: doesn't open her mouth and thus can not test  Motor: Moving all extremities left>right Tone and bulk:normal tone throughout; no atrophy noted Sensory: withdraws to pain  Deep Tendon Reflexes:  1 all over Plantars: No tested Cerebellar: Unable to test. Gait: Unable to test.     Lab Results: Lab Results  Component Value Date/Time   CHOL 204* 09/02/2014 05:30 AM   Lipid Panel No results for input(s): CHOL, TRIG, HDL, CHOLHDL, VLDL, LDLCALC in the last 72 hours.  Studies/Results: No results found.  MEDICATIONS                                                                                                                        Scheduled: . allopurinol  100 mg Per Tube BID  . amantadine  100 mg Per Tube BID  . amiodarone  200 mg Per Tube Daily  . antiseptic oral rinse  7 mL Mouth Rinse q12n4p  . aspirin  325 mg Per Tube Daily  . atorvastatin  80 mg Per Tube q1800  . chlorhexidine  15 mL Mouth Rinse BID  . diltiazem  30 mg Oral 4 times per day  . enoxaparin (LOVENOX) injection  40 mg Subcutaneous Q24H  . feeding supplement (VITAL AF 1.2 CAL)  280 mL Per Tube QID  . free water  300 mL Per Tube Q4H  . insulin aspart  0-15 Units Subcutaneous 6 times per day  . latanoprost  1 drop Right Eye QHS  . meropenem (MERREM) IV  1 g Intravenous Q12H  . metoprolol tartrate  100 mg Per Tube BID  . pantoprazole sodium  40 mg Per Tube Daily  . sodium chloride  3 mL Intravenous Q12H    ASSESSMENT/PLAN:                                                                                                            80 y/o with recent non-dominant IVH secondary to coumadin associated coagulopathy in setting of uncontrolled hypertension, re admitted with an encephalopathy of unclear etiology (although has UTI on antimicrobials). Resumed amantadine yesterday and appears to be more awake today, although  still not following commands. Would like to see how she does in the next 24 hours. If no substantial improvement will get CT brain to ensure the prior hemorrhage has cleared and consider LP. Will update family when they arrive today. Will follow up.   Wyatt Portelasvaldo Camilo, MD [facility] [phone]  10/18/2014, 8:09 AM

## 2014-10-18 NOTE — Progress Notes (Signed)
PROGRESS NOTE  Susan Calhoun YQM:[id number] DOB: 1928/01/29 DOA: 10/08/2014 PCP: Pcp Not In System  Assessment/Plan: Acute encephalopathy -In the setting of recent IVH s/p drain--October 2015 --admitted with recent CVA with IVH, hydrocephalous and h/o temporary drain with fluctuating mental status then, went to SNF 11/10. -She had multiple EEGs including LTM done showing general slowing and frequent intermittent triphasic waves. She did have generalized tremors during hospital stay with no EEG correlate --MRI brain without acute changes -was on Cefepime for UTI, FU Urine Cx- with Klebsiella, resistant to Cephalosporins, hence changed to Meropenem 11/21 -unfortunately continues to be encephalopathic with minimal improvement -ammonia--14 -check RPR, B12, folate, am cortisol--pending -TSH 2.060 -10/16/2014--very little improvement. Patient opens eyes but does not follow commands  -palliative medicine was consulted -10/17/14--as the patient's family continued to struggle with the patient's decline--patient's family has requested to speak with the neurologist -10/17/2014--Reconsulted neurology--case was discussed with Dr. Trula Slade amantidine Gastroparesis -Patient had significant to feed residuals on 10/18/2014 -May be due to anticholinergic side effects of amantadine -Start Reglan -10/18/2014 2 view abdominal x-ray negative for ileus or obstruction AKI -resolved -Baseline creatinine 0.9-1.2 Dysphagia -in setting of recent IVH   -continue enteral feeds--tolerating well -10/18/2014 abdominal x-ray negative for any ileus or obstruction Atrial fibrillation -not an AC candidate -continue metoprolol tartrate and diltiazem -Continue amiodarone -presently in sinus UTI -Klebsiella pneumoniae -continue Merrem (started 11/21)--finish last dose on 10/18/2014 Diarrhea -C.diff neg -likely due to enteral feeds Hypertension -BP has been labile but acceptable  presently -Continue metoprolol tartrate and diltiazem     Family Communication: Family updated at beside Disposition Plan: SNF        Procedures/Studies: Ct Abdomen Wo Contrast  Sep 29, 2014   CLINICAL DATA:  Malnutrition.  Anatomy for PEG tube placement.  EXAM: CT ABDOMEN WITHOUT CONTRAST  TECHNIQUE: Multidetector CT imaging of the abdomen was performed following the standard protocol without IV contrast.  COMPARISON:  None.  FINDINGS: LOWER CHEST: Extensive coronary atherosclerosis. Partially visualized aortic valve sclerosis/calcification.  Small and layering bilateral pleural effusions with passive atelectasis.  ABDOMEN/PELVIS:  Liver: No focal abnormality.  Biliary: Sludge or other layering high density within the gallbladder. No distention.  Pancreas: Unremarkable.  Spleen: Unremarkable.  Adrenals: Unremarkable.  Kidneys and ureters: No hydronephrosis.  Bowel: Feeding tube tip terminates in the proximal stomach. The non insufflated stomach minimally overlaps with the left lobe of the liver. The transverse colon is well inferior to the stomach. There is no evidence of gastric mass or displacement. No abdominal wall hernia or other impediment. No bowel obstruction.  Retroperitoneum: No mass or adenopathy.  Peritoneum: No ascites or pneumoperitoneum.  Vascular: Extensive atherosclerotic calcification.  OSSEOUS: No acute abnormalities.  IMPRESSION: 1. Normal gastric morphology and position. Surrounding anatomy described above. 2. Small pleural effusions with passive atelectasis.   Electronically Signed   By: Tiburcio Pea M.D.   On: September 29, 2014 10:05   Dg Chest 2 View  09/28/2014   CLINICAL DATA:  Fever, history of hypertension  EXAM: CHEST  2 VIEW  COMPARISON:  09/17/2014  FINDINGS: Evidence of CABG. Lungs are hypoaerated with crowding of the bronchovascular markings. Mild cardiomegaly reidentified with central vascular congestion but no overt edema. No new focal pulmonary opacity. Trace  pleural effusions are noted.  IMPRESSION: Mild cardiomegaly with central vascular congestion and trace pleural fluid.   Electronically Signed   By: Christiana Pellant M.D.   On: 09/28/2014 15:45   Ct Head Wo Contrast  10/08/2014   CLINICAL DATA:  Increasing weakness and lethargy since Saturday, recent UTIs, personal history of diabetes, renal disorder, hypertension  EXAM: CT HEAD WITHOUT CONTRAST  TECHNIQUE: Contiguous axial images were obtained from the base of the skull through the vertex without intravenous contrast.  COMPARISON:  09/26/2014  FINDINGS: Minimal atrophy.  Mild prominence of the ventricular system little changed.  Small vessel chronic ischemic changes of deep cerebral white matter.  Intraventricular blood identified at the occipital horns of the lateral ventricles on the previous exam no longer identified.  Focus of high attenuation at the RIGHT parietal lobe likely tiny calcification, unchanged since 09/04/2014 making this unlikely to represent blood.  No definite high attenuation acute intracranial hemorrhage, mass lesion or evidence acute infarction.  No extra-axial fluid collections.  Atherosclerotic calcifications at the carotid siphons.  Scattered motion artifacts.  Prior burr hole RIGHT frontal.  Hyperostosis frontalis interna.  No acute osseous findings.  IMPRESSION: Motion artifacts.  Atrophy with mild persistent prominence of the ventricular system and small vessel chronic ischemic changes.  Resolution of minimal intraventricular blood seen on the previous exam.   Electronically Signed   By: Ulyses Southward M.D.   On: 10/08/2014 22:13   Ct Head Wo Contrast  09/26/2014   CLINICAL DATA:  Progressive cognitive decline over the last 2 days.  EXAM: CT HEAD WITHOUT CONTRAST  TECHNIQUE: Contiguous axial images were obtained from the base of the skull through the vertex without intravenous contrast.  COMPARISON:  CT head without contrast 09/14/2014.  FINDINGS: Minimal blood products are still seen  dependently within the posterior horns of the lateral ventricles. There is no new hemorrhage. Ventricular enlargement is similar to the prior study although the temporal horns may be slightly more prominent. No acute cortical infarct, hemorrhage, or other mass lesion is present. Mild mucosal thickening is present in the sphenoid sinuses bilaterally. The remaining paranasal sinuses and the mastoid air cells are clear.  IMPRESSION: 1. Near complete resolution of layering blood products in the posterior horn of the lateral ventricles bilaterally. 2. Stable ventricular enlargement. 3. No new hemorrhage. 4. Stable atrophy and moderate white matter disease.   Electronically Signed   By: Gennette Pac M.D.   On: 09/26/2014 18:20   Mr Brain Wo Contrast  10/10/2014   CLINICAL DATA:  Encephalopathy. Intraventricular hemorrhage and drain placement last month.  EXAM: MRI HEAD WITHOUT CONTRAST  TECHNIQUE: Multiplanar, multiecho pulse sequences of the brain and surrounding structures were obtained without intravenous contrast.  COMPARISON:  Head CT 09/28/2014 and MRI 09/12/2014  FINDINGS: Images are mildly to moderately degraded by motion artifact.  No acute infarct is identified. There is a small amount of diffusion weighted signal abnormality along the prior right frontal ventriculostomy catheter tract likely secondary to prior intervention. No sizable intracranial hemorrhage is identified, although T2* images are particularly motion degraded. There may be minimal residual chronic blood products in the occipital horns.  The lateral and third ventricles remain moderately dilated but overall slightly improved compared to the prior MRI. Mild T2 hyperintensity in the periventricular white matter bilaterally does not appear significantly changed, nonspecific. No mass, midline shift, or extra-axial fluid collection is identified.  Prior bilateral cataract extraction is noted. There is mild right greater the left sphenoid sinus  mucosal thickening. Mastoid air cells are clear. Major intracranial vascular flow voids are preserved.  IMPRESSION: 1. No evidence acute/new intracranial abnormality. 2. Slightly decreased ventricular dilatation since 09/12/2014 MRI.   Electronically Signed   By: Freida Busman  Mosetta Putt   On: 10/10/2014 12:22   Ir Gastrostomy Tube Mod Sed  09/23/2014   INDICATION: Patient admitted with intraventricular/subarachnoid hemorrhage with persistent altered mental status and now with dysphagia. Request made for percutaneous gastrostomy tube placement for enteric nutrition.  EXAM: PULL TROUGH GASTROSTOMY TUBE PLACEMENT  COMPARISON:  CT abdomen and pelvis - 09/23/2014  MEDICATIONS: The patient is currently admitted to the hospital receiving intravenous antibiotics. The antibiotics were administered within an appropriate time frame prior to initiation of the procedure.  CONTRAST:  15 mL of Isovue 300 administered into the gastric lumen.  ANESTHESIA/SEDATION: Versed 1 mg IV; Fentanyl 50 mcg IV  Sedation time  25 minutes  FLUOROSCOPY TIME:  4 minutes.  COMPLICATIONS: None immediate  PROCEDURE: Informed written consent was obtained from the patient's daughter following explanation of the procedure, risks, benefits and alternatives. A time out was performed prior to the initiation of the procedure. Ultrasound scanning was performed to demarcate the edge of the left lobe of the liver. Maximal barrier sterile technique utilized including caps, mask, sterile gowns, sterile gloves, large sterile drape, hand hygiene and Betadine prep.  The left upper quadrant was sterilely prepped and draped. An oral gastric catheter was inserted into the stomach under fluoroscopy. The existing nasogastric feeding tube was removed. The left costal margin and air opacified transverse colon were identified and avoided. Air was injected into the stomach for insufflation and visualization under fluoroscopy. Under sterile conditions a 17 gauge trocar needle was  utilized to access the stomach percutaneously beneath the left subcostal margin after the overlying soft tissues were anesthetized with 1% Lidocaine with epinephrine. Needle position was confirmed within the stomach with aspiration of air and injection of small amount of contrast. A single T tack was deployed for gastropexy. Over an Amplatz guide wire, a 9-French sheath was inserted into the stomach. A snare device was utilized to capture the oral gastric catheter. The snare device was pulled retrograde from the stomach up the esophagus and out the oropharynx. The 20-French pull-through gastrostomy was connected to the snare device and pulled antegrade through the oropharynx down the esophagus into the stomach and then through the percutaneous tract external to the patient. The gastrostomy was assembled externally. Contrast injection confirms position in the stomach. Several spot radiographic images were obtained in various obliquities for documentation. The patient tolerated procedure well without immediate post procedural complication.  FINDINGS: After successful fluoroscopic guided placement, the gastrostomy tube is appropriately positioned with internal disc against the ventral aspect of the gastric lumen.  IMPRESSION: Successful fluoroscopic insertion of a 20-French pull-through gastrostomy tube.  The gastrostomy may be used immediately for medication administration and in 24 hrs for the initiation of feeds.   Electronically Signed   By: Simonne Come M.D.   On: 09/23/2014 14:55   Dg Chest Port 1 View  10/08/2014   CLINICAL DATA:  Initial evaluation for weakness and lethargy for 3 days with altered mental status  EXAM: PORTABLE CHEST - 1 VIEW  COMPARISON:  09/30/2014  FINDINGS: The heart size and mediastinal contours are within normal limits. Both lungs are clear. The visualized skeletal structures are unremarkable.  IMPRESSION: No active disease.   Electronically Signed   By: Esperanza Heir M.D.   On:  10/08/2014 21:32   Dg Chest Port 1 View  09/30/2014   CLINICAL DATA:  Cough.  EXAM: PORTABLE CHEST - 1 VIEW  COMPARISON:  09/28/2014.  FINDINGS: Mediastinum hilar structures are normal. Prior CABG. Stable cardiomegaly. Interval improvement  of pulmonary venous congestion. Mild bibasilar subsegmental atelectasis. Small left pleural effusion cannot be excluded. No pneumothorax. Degenerative changes thoracic spine and both shoulders.  IMPRESSION: 1. Prior CABG. Stable cardiomegaly. Interim resolution of pulmonary venous congestion. 2. Mild bibasilar atelectasis. Small left pleural effusion cannot be excluded.   Electronically Signed   By: Maisie Fushomas  Register   On: 09/30/2014 07:23   Dg Abd 2 Views  10/18/2014   CLINICAL DATA:  Increased abdominal distention. Unable to communicate.  EXAM: ABDOMEN - 2 VIEW  COMPARISON:  09/20/2014 and abdomen and pelvis CT dated 09/23/2014.  FINDINGS: Gastrostomy tube in the expected position of the proximal to mid stomach. Normal bowel gas pattern without free peritoneal air. Lumbar and lower thoracic spine degenerative changes. Post CABG changes. Atheromatous arterial calcifications.  IMPRESSION: No acute abnormality.   Electronically Signed   By: Gordan PaymentSteve  Reid M.D.   On: 10/18/2014 13:28   Dg Abd Portable 1v  09/20/2014   CLINICAL DATA:  Feeding tube placement.  EXAM: PORTABLE ABDOMEN - 1 VIEW  COMPARISON:  09/09/14  FINDINGS: A feeding tube is now seen with the tip overlying the proximal body of the stomach. No evidence of dilated bowel loops.  IMPRESSION: Feeding tube tip overlies the proximal body of the stomach.   Electronically Signed   By: Myles RosenthalJohn  Stahl M.D.   On: 09/20/2014 19:20         Subjective: Patient opens eyes but does not answer questions or follow commands. No respiratory distress or vomiting. No reports of uncontrolled pain or diarrhea. She has occasional loose stools.  Objective: Filed Vitals:   10/17/14 0936 10/17/14 1723 10/17/14 2001 10/18/14  0442  BP: 158/97 104/39 121/42 187/69  Pulse:  72 70 83  Temp: 99.2 F (37.3 C) 98 F (36.7 C) 98.7 F (37.1 C) 98.9 F (37.2 C)  TempSrc: Oral Oral Oral Oral  Resp: 18 18 17 18   Height:      Weight:   66.134 kg (145 lb 12.8 oz)   SpO2: 99% 99% 100% 100%    Intake/Output Summary (Last 24 hours) at 10/18/14 1654 Last data filed at 10/18/14 1320  Gross per 24 hour  Intake    880 ml  Output      0 ml  Net    880 ml   Weight change: 0.091 kg (3.2 oz) Exam:   General:  Pt is alert, follows commands appropriately, not in acute distress  HEENT: No icterus, No thrush,  [place]/AT  Cardiovascular: RRR, S1/S2, no rubs, no gallops  Respiratory: CTA bilaterally, no wheezing, no crackles, no rhonchi  Abdomen: Soft/+BS, non tender, non distended, no guarding  Extremities: No edema, No lymphangitis, No petechiae, No rashes, no synovitis  Data Reviewed: Basic Metabolic Panel:  Recent Labs Lab 10/13/14 0515 10/14/14 0557 10/16/14 0504 10/18/14 0937  NA 139 137 139 138  K 4.1 4.2 4.3 4.5  CL 101 101 102 100  CO2 22 20 22 24   GLUCOSE 171* 144* 131* 93  BUN 33* 36* 35* 34*  CREATININE 1.02 0.96 0.84 0.79  CALCIUM 8.6 8.5 8.8 9.0   Liver Function Tests:  Recent Labs Lab 10/16/14 0504 10/18/14 0937  AST 29 24  ALT 35 27  ALKPHOS 93 86  BILITOT <0.2* 0.2*  PROT 6.9 6.7  ALBUMIN 2.3* 2.3*   No results for input(s): LIPASE, AMYLASE in the last 168 hours. No results for input(s): AMMONIA in the last 168 hours. CBC:  Recent Labs Lab 10/14/14 0557 10/16/14  [id number] 10/18/14 0937  WBC 12.5* 11.2* 10.6*  HGB 10.5* 10.3* 10.1*  HCT 33.6* 32.9* 31.5*  MCV 94.1 96.5 94.9  PLT 255 241 218   Cardiac Enzymes: No results for input(s): CKTOTAL, CKMB, CKMBINDEX, TROPONINI in the last 168 hours. BNP: Invalid input(s): POCBNP CBG:  Recent Labs Lab 10/17/14 1958 10/18/14 0036 10/18/14 0436 10/18/14 0757 10/18/14 1204  GLUCAP 135* 169* 136* 102* 88    Recent Results  (from the past 240 hour(s))  Urine culture     Status: None   Collection Time: 10/08/14  8:56 PM  Result Value Ref Range Status   Specimen Description URINE, CATHETERIZED  Final   Special Requests NONE  Final   Culture  Setup Time   Final    10/09/2014 05:03 Performed at [facility]    Colony Count   Final    >=100,000 COLONIES/ML Performed at [facility]    Culture   Final    KLEBSIELLA PNEUMONIAE Note: MEROPENEM SENSITIVE <=0.25 Performed at [facility]    Report Status 10/13/2014 FINAL  Final   Organism ID, Bacteria KLEBSIELLA PNEUMONIAE  Final      Susceptibility   Klebsiella pneumoniae - MIC*    AMPICILLIN >=32 RESISTANT Resistant     CEFAZOLIN >=64 RESISTANT Resistant     CEFTRIAXONE 32 INTERMEDIATE Intermediate     CIPROFLOXACIN <=0.25 SENSITIVE Sensitive     GENTAMICIN >=16 RESISTANT Resistant     LEVOFLOXACIN <=0.12 SENSITIVE Sensitive     NITROFURANTOIN 32 SENSITIVE Sensitive     TOBRAMYCIN 2 SENSITIVE Sensitive     TRIMETH/SULFA <=20 SENSITIVE Sensitive     PIP/TAZO 64 INTERMEDIATE Intermediate     * KLEBSIELLA PNEUMONIAE  MRSA PCR Screening     Status: None   Collection Time: 10/09/14  1:21 AM  Result Value Ref Range Status   MRSA by PCR NEGATIVE NEGATIVE Final    Comment:        The GeneXpert MRSA Assay (FDA approved for NASAL specimens only), is one component of a comprehensive MRSA colonization surveillance program. It is not intended to diagnose MRSA infection nor to guide or monitor treatment for MRSA infections.   Clostridium Difficile by PCR     Status: None   Collection Time: 10/14/14  8:20 PM  Result Value Ref Range Status   C difficile by pcr NEGATIVE NEGATIVE Final     Scheduled Meds: . allopurinol  100 mg Per Tube BID  . amantadine  100 mg Per Tube BID  . amiodarone  200 mg Per Tube Daily  . antiseptic oral rinse  7 mL Mouth Rinse q12n4p  . aspirin  325 mg Per Tube Daily  . atorvastatin  80 mg Per Tube  q1800  . chlorhexidine  15 mL Mouth Rinse BID  . diltiazem  30 mg Oral 4 times per day  . enoxaparin (LOVENOX) injection  40 mg Subcutaneous Q24H  . feeding supplement (VITAL AF 1.2 CAL)  280 mL Per Tube QID  . free water  300 mL Per Tube Q4H  . insulin aspart  0-15 Units Subcutaneous 6 times per day  . latanoprost  1 drop Right Eye QHS  . meropenem (MERREM) IV  1 g Intravenous Q12H  . metoprolol tartrate  100 mg Per Tube BID  . pantoprazole sodium  40 mg Per Tube Daily  . sodium chloride  3 mL Intravenous Q12H   Continuous Infusions:    Timmothy Baranowski, DO  [facility] Pager [phone]  If 7PM-7AM, please contact night-coverage [email or web address] Password [email or web address] 10/18/2014, 4:54 PM   LOS: 10 days

## 2014-10-19 ENCOUNTER — Inpatient Hospital Stay (HOSPITAL_COMMUNITY): Payer: Medicare (Managed Care)

## 2014-10-19 ENCOUNTER — Encounter (HOSPITAL_COMMUNITY): Payer: Self-pay | Admitting: *Deleted

## 2014-10-19 LAB — GLUCOSE, CAPILLARY
GLUCOSE-CAPILLARY: 103 mg/dL — AB (ref 70–99)
GLUCOSE-CAPILLARY: 129 mg/dL — AB (ref 70–99)
GLUCOSE-CAPILLARY: 136 mg/dL — AB (ref 70–99)
GLUCOSE-CAPILLARY: 140 mg/dL — AB (ref 70–99)
GLUCOSE-CAPILLARY: 86 mg/dL (ref 70–99)
GLUCOSE-CAPILLARY: 95 mg/dL (ref 70–99)
Glucose-Capillary: 165 mg/dL — ABNORMAL HIGH (ref 70–99)

## 2014-10-19 NOTE — Progress Notes (Signed)
NEURO HOSPITALIST PROGRESS NOTE   SUBJECTIVE:                                                                                                                        Susan Calhoun is quite awake, tracks me around the room, but is not following commands. Resumed amantadine. She is off benzos, SSRI's, or antipsychotics.  Labs are unimpressive. She remains afebrile. OBJECTIVE:                                                                                                                           Vital signs in last 24 hours: Temp:  [98.4 F (36.9 C)-98.7 F (37.1 C)] 98.7 F (37.1 C) (11/29 0440) Pulse Rate:  [65-89] 89 (11/29 0440) Resp:  [17-19] 17 (11/29 0440) BP: (108-151)/(43-91) 151/43 mmHg (11/29 0440) SpO2:  [98 %-100 %] 98 % (11/29 0440) Weight:  [65.7 kg (144 lb 13.5 oz)] 65.7 kg (144 lb 13.5 oz) (11/28 2012)  Intake/Output from previous day: 11/28 0701 - 11/29 0700 In: 1560 [NG/GT:1460; IV Piggyback:100] Out: -  Intake/Output this shift:   Nutritional status: Diet NPO time specified  Past Medical History  Diagnosis Date  . Diabetes mellitus with diabetic polyneuropathy   . A-fib   . Renal disorder   . Hypertension   . Anemia   . Glaucoma     right eye   . Carotid artery stenosis   . PVD (peripheral vascular disease)    Physical exam: awake, no apparent distress. Head: normocephalic. Neck: supple, no bruits, no JVD. Cardiac: no murmurs. Lungs: clear. Abdomen: soft, no tender, no mass. Extremities: no edema.  Neurologic Exam:  General: Mental Status: Awake but doesn't follow commands. II: Discs flat bilaterally; Visual fields grossly normal, pupils unequal (old finding s/p surgery) reactive to light III,IV, VI: ptosis not present, extra-ocular motions intact bilaterally V,VII: smile symmetric, facial light touch sensation unreliable on testing. VIII: hearing seems to be grossly normal bilaterally IX,X: gag reflex  present XI: bilateral shoulder shrug no tested XII: doesn't open her mouth and thus can not test Motor: Moving all extremities left>right Tone and bulk:normal tone throughout; no atrophy noted Sensory: withdraws to pain Deep Tendon Reflexes:  1 all over Plantars: No tested  Cerebellar: Unable to test. Gait: Unable to test.  Lab Results: Lab Results  Component Value Date/Time   CHOL 204* 09/02/2014 05:30 AM   Lipid Panel No results for input(s): CHOL, TRIG, HDL, CHOLHDL, VLDL, LDLCALC in the last 72 hours.  Studies/Results: Dg Abd 2 Views  10/18/2014   CLINICAL DATA:  Increased abdominal distention. Unable to communicate.  EXAM: ABDOMEN - 2 VIEW  COMPARISON:  09/20/2014 and abdomen and pelvis CT dated 09/23/2014.  FINDINGS: Gastrostomy tube in the expected position of the proximal to mid stomach. Normal bowel gas pattern without free peritoneal air. Lumbar and lower thoracic spine degenerative changes. Post CABG changes. Atheromatous arterial calcifications.  IMPRESSION: No acute abnormality.   Electronically Signed   By: Gordan PaymentSteve  Reid M.D.   On: 10/18/2014 13:28    MEDICATIONS                                                                                                                        Scheduled: . allopurinol  100 mg Per Tube BID  . amantadine  100 mg Per Tube BID  . amiodarone  200 mg Per Tube Daily  . antiseptic oral rinse  7 mL Mouth Rinse q12n4p  . aspirin  325 mg Per Tube Daily  . atorvastatin  80 mg Per Tube q1800  . chlorhexidine  15 mL Mouth Rinse BID  . diltiazem  30 mg Oral 4 times per day  . enoxaparin (LOVENOX) injection  40 mg Subcutaneous Q24H  . feeding supplement (VITAL AF 1.2 CAL)  280 mL Per Tube QID  . free water  300 mL Per Tube Q4H  . insulin aspart  0-15 Units Subcutaneous 6 times per day  . latanoprost  1 drop Right Eye QHS  . metoCLOPramide (REGLAN) injection  5 mg Intravenous 3 times per day  . metoprolol tartrate  100 mg Per Tube BID  .  pantoprazole sodium  40 mg Per Tube Daily  . sodium chloride  3 mL Intravenous Q12H    ASSESSMENT/PLAN:                                                                                                           80 y/o with recent non-dominant IVH secondary to coumadin associated coagulopathy in setting of uncontrolled hypertension, re admitted with an encephalopathy of unclear etiology (although has UTI on antimicrobials). Resumed amantadine yesterday and appears to be more awake today, although still not following commands. Plan to get CT brain today. Consider LP if CT unremarkable.  Wyatt Portela, MD [facility] [phone]  10/19/2014, 9:22 AM

## 2014-10-19 NOTE — Progress Notes (Addendum)
PROGRESS NOTE  Susan Calhoun ZOX:[id number] DOB: 1928-02-13 DOA: 10/08/2014 PCP: Pcp Not In System  Brief history 80 y.o. female with history of diabetes mellitus, chronic kidney disease, hypertension, peripheral vascular disease, recent CVA with intraventricular hemorrhage status post drain placement and removal. The patient was brought in from her nursing facility by her daughter due to increasing confusion.  She was discharged to the skilled nursing facility on 09/30/2014. The patient had been able to tolerate oral feeding as well as enteral feeding. For 24-48 hours prior to admission, the patient had been much less responsive to verbal stimuli. Neurology was consulted. MRI of the brain was obtained and did not reveal any acute findings. Chest x-ray was negative. The patient was found to have pyuria and was diagnosed with a urinary tract infection with Klebsiella. Her antibiotics were adjusted, and the patient was started on meropenem which she finished 7 days during the hospitalization.  In addition, the patient's acute kidney injury also improved. Unfortunately, the patient's mental status only improved minimally. As result, palliative medicine was consulted. The family struggled to make a decision with regard to transitioning to comfort care as the patient was independent as recent as 6 weeks ago.  Neurology was reconsulted. Workup for reversible causes of delirium were unremarkable. Repeat EEGs including long-term monitoring did not reveal any epileptiform activity. She was started on amantadine on 10/17/2014 evening.  Assessment/Plan: Acute encephalopathy -In the setting of recent IVH s/p drain--October 2015 --admitted with recent CVA with IVH, hydrocephalous and h/o temporary drain with fluctuating mental status then, went to SNF 11/10. -She had multiple EEGs including LTM done showing general slowing and frequent intermittent triphasic waves. She did have generalized tremors  during hospital stay with no EEG correlate --MRI brain without acute changes -was on Cefepime for UTI, FU Urine Cx- with Klebsiella, resistant to Cephalosporins, hence changed to Meropenem 11/21 -unfortunately continues to be encephalopathic with minimal improvement -ammonia--14 -check RPR, B12, folate, am cortisol--all neg/unremarkable -TSH 2.060 -10/16/2014--very little improvement. Patient opens eyes but does not follow commands  -palliative medicine was consulted -10/17/14--as the patient's family continued to struggle with the patient's decline--patient's family has requested to speak with the neurologist -10/17/2014--Reconsulted neurology--case was discussed with Dr. Camilo-->started amantidine 10/17/14 evening -plans noted for LP if no improvement Gastroparesis -Patient had significant to feed residuals on 10/18/2014 -May be due to anticholinergic side effects of amantadine -Started Reglan-->improved -10/18/2014 2 view abdominal x-ray negative for ileus or obstruction AKI -resolved -Baseline creatinine 0.9-1.2 Dysphagia -in setting of recent IVH   -continue enteral feeds--tolerating well -10/18/2014 abdominal x-ray negative for any ileus or obstruction Atrial fibrillation -not an AC candidate -continue metoprolol tartrate and diltiazem -Continue amiodarone -presently in sinus UTI -Klebsiella pneumoniae -continue Merrem (started 11/21)--finished last dose on 10/18/2014 Diarrhea -C.diff neg -likely due to enteral feeds Hypertension -BP has been labile but acceptable presently -Continue metoprolol tartrate and diltiazem   Family Communication: Family updated at beside Disposition Plan: SNF         Procedures/Studies: Ct Abdomen Wo Contrast  10/20/14   CLINICAL DATA:  Malnutrition.  Anatomy for PEG tube placement.  EXAM: CT ABDOMEN WITHOUT CONTRAST  TECHNIQUE: Multidetector CT imaging of the abdomen was performed following the standard protocol without  IV contrast.  COMPARISON:  None.  FINDINGS: LOWER CHEST: Extensive coronary atherosclerosis. Partially visualized aortic valve sclerosis/calcification.  Small and layering bilateral pleural effusions with passive atelectasis.  ABDOMEN/PELVIS:  Liver: No focal abnormality.  Biliary: Sludge or other layering high density within the gallbladder. No distention.  Pancreas: Unremarkable.  Spleen: Unremarkable.  Adrenals: Unremarkable.  Kidneys and ureters: No hydronephrosis.  Bowel: Feeding tube tip terminates in the proximal stomach. The non insufflated stomach minimally overlaps with the left lobe of the liver. The transverse colon is well inferior to the stomach. There is no evidence of gastric mass or displacement. No abdominal wall hernia or other impediment. No bowel obstruction.  Retroperitoneum: No mass or adenopathy.  Peritoneum: No ascites or pneumoperitoneum.  Vascular: Extensive atherosclerotic calcification.  OSSEOUS: No acute abnormalities.  IMPRESSION: 1. Normal gastric morphology and position. Surrounding anatomy described above. 2. Small pleural effusions with passive atelectasis.   Electronically Signed   By: Tiburcio Pea M.D.   On: 09/23/2014 10:05   Dg Chest 2 View  09/28/2014   CLINICAL DATA:  Fever, history of hypertension  EXAM: CHEST  2 VIEW  COMPARISON:  09/17/2014  FINDINGS: Evidence of CABG. Lungs are hypoaerated with crowding of the bronchovascular markings. Mild cardiomegaly reidentified with central vascular congestion but no overt edema. No new focal pulmonary opacity. Trace pleural effusions are noted.  IMPRESSION: Mild cardiomegaly with central vascular congestion and trace pleural fluid.   Electronically Signed   By: Christiana Pellant M.D.   On: 09/28/2014 15:45   Ct Head Wo Contrast  10/08/2014   CLINICAL DATA:  Increasing weakness and lethargy since Saturday, recent UTIs, personal history of diabetes, renal disorder, hypertension  EXAM: CT HEAD WITHOUT CONTRAST  TECHNIQUE:  Contiguous axial images were obtained from the base of the skull through the vertex without intravenous contrast.  COMPARISON:  09/26/2014  FINDINGS: Minimal atrophy.  Mild prominence of the ventricular system little changed.  Small vessel chronic ischemic changes of deep cerebral white matter.  Intraventricular blood identified at the occipital horns of the lateral ventricles on the previous exam no longer identified.  Focus of high attenuation at the RIGHT parietal lobe likely tiny calcification, unchanged since 09/04/2014 making this unlikely to represent blood.  No definite high attenuation acute intracranial hemorrhage, mass lesion or evidence acute infarction.  No extra-axial fluid collections.  Atherosclerotic calcifications at the carotid siphons.  Scattered motion artifacts.  Prior burr hole RIGHT frontal.  Hyperostosis frontalis interna.  No acute osseous findings.  IMPRESSION: Motion artifacts.  Atrophy with mild persistent prominence of the ventricular system and small vessel chronic ischemic changes.  Resolution of minimal intraventricular blood seen on the previous exam.   Electronically Signed   By: Ulyses Southward M.D.   On: 10/08/2014 22:13   Ct Head Wo Contrast  09/26/2014   CLINICAL DATA:  Progressive cognitive decline over the last 2 days.  EXAM: CT HEAD WITHOUT CONTRAST  TECHNIQUE: Contiguous axial images were obtained from the base of the skull through the vertex without intravenous contrast.  COMPARISON:  CT head without contrast 09/14/2014.  FINDINGS: Minimal blood products are still seen dependently within the posterior horns of the lateral ventricles. There is no new hemorrhage. Ventricular enlargement is similar to the prior study although the temporal horns may be slightly more prominent. No acute cortical infarct, hemorrhage, or other mass lesion is present. Mild mucosal thickening is present in the sphenoid sinuses bilaterally. The remaining paranasal sinuses and the mastoid air cells  are clear.  IMPRESSION: 1. Near complete resolution of layering blood products in the posterior horn of the lateral ventricles bilaterally. 2. Stable ventricular enlargement. 3. No new hemorrhage. 4. Stable atrophy and moderate white matter  disease.   Electronically Signed   By: Gennette Pachris  Mattern M.D.   On: 09/26/2014 18:20   Mr Brain Wo Contrast  10/10/2014   CLINICAL DATA:  Encephalopathy. Intraventricular hemorrhage and drain placement last month.  EXAM: MRI HEAD WITHOUT CONTRAST  TECHNIQUE: Multiplanar, multiecho pulse sequences of the brain and surrounding structures were obtained without intravenous contrast.  COMPARISON:  Head CT 09/28/2014 and MRI 09/12/2014  FINDINGS: Images are mildly to moderately degraded by motion artifact.  No acute infarct is identified. There is a small amount of diffusion weighted signal abnormality along the prior right frontal ventriculostomy catheter tract likely secondary to prior intervention. No sizable intracranial hemorrhage is identified, although T2* images are particularly motion degraded. There may be minimal residual chronic blood products in the occipital horns.  The lateral and third ventricles remain moderately dilated but overall slightly improved compared to the prior MRI. Mild T2 hyperintensity in the periventricular white matter bilaterally does not appear significantly changed, nonspecific. No mass, midline shift, or extra-axial fluid collection is identified.  Prior bilateral cataract extraction is noted. There is mild right greater the left sphenoid sinus mucosal thickening. Mastoid air cells are clear. Major intracranial vascular flow voids are preserved.  IMPRESSION: 1. No evidence acute/new intracranial abnormality. 2. Slightly decreased ventricular dilatation since 09/12/2014 MRI.   Electronically Signed   By: Sebastian AcheAllen  Grady   On: 10/10/2014 12:22   Ir Gastrostomy Tube Mod Sed  09/23/2014   INDICATION: Patient admitted with intraventricular/subarachnoid  hemorrhage with persistent altered mental status and now with dysphagia. Request made for percutaneous gastrostomy tube placement for enteric nutrition.  EXAM: PULL TROUGH GASTROSTOMY TUBE PLACEMENT  COMPARISON:  CT abdomen and pelvis - 09/23/2014  MEDICATIONS: The patient is currently admitted to the hospital receiving intravenous antibiotics. The antibiotics were administered within an appropriate time frame prior to initiation of the procedure.  CONTRAST:  15 mL of Isovue 300 administered into the gastric lumen.  ANESTHESIA/SEDATION: Versed 1 mg IV; Fentanyl 50 mcg IV  Sedation time  25 minutes  FLUOROSCOPY TIME:  4 minutes.  COMPLICATIONS: None immediate  PROCEDURE: Informed written consent was obtained from the patient's daughter following explanation of the procedure, risks, benefits and alternatives. A time out was performed prior to the initiation of the procedure. Ultrasound scanning was performed to demarcate the edge of the left lobe of the liver. Maximal barrier sterile technique utilized including caps, mask, sterile gowns, sterile gloves, large sterile drape, hand hygiene and Betadine prep.  The left upper quadrant was sterilely prepped and draped. An oral gastric catheter was inserted into the stomach under fluoroscopy. The existing nasogastric feeding tube was removed. The left costal margin and air opacified transverse colon were identified and avoided. Air was injected into the stomach for insufflation and visualization under fluoroscopy. Under sterile conditions a 17 gauge trocar needle was utilized to access the stomach percutaneously beneath the left subcostal margin after the overlying soft tissues were anesthetized with 1% Lidocaine with epinephrine. Needle position was confirmed within the stomach with aspiration of air and injection of small amount of contrast. A single T tack was deployed for gastropexy. Over an Amplatz guide wire, a 9-French sheath was inserted into the stomach. A snare  device was utilized to capture the oral gastric catheter. The snare device was pulled retrograde from the stomach up the esophagus and out the oropharynx. The 20-French pull-through gastrostomy was connected to the snare device and pulled antegrade through the oropharynx down the esophagus into the stomach  and then through the percutaneous tract external to the patient. The gastrostomy was assembled externally. Contrast injection confirms position in the stomach. Several spot radiographic images were obtained in various obliquities for documentation. The patient tolerated procedure well without immediate post procedural complication.  FINDINGS: After successful fluoroscopic guided placement, the gastrostomy tube is appropriately positioned with internal disc against the ventral aspect of the gastric lumen.  IMPRESSION: Successful fluoroscopic insertion of a 20-French pull-through gastrostomy tube.  The gastrostomy may be used immediately for medication administration and in 24 hrs for the initiation of feeds.   Electronically Signed   By: Simonne ComeJohn  Watts M.D.   On: 09/23/2014 14:55   Dg Chest Port 1 View  10/08/2014   CLINICAL DATA:  Initial evaluation for weakness and lethargy for 3 days with altered mental status  EXAM: PORTABLE CHEST - 1 VIEW  COMPARISON:  09/30/2014  FINDINGS: The heart size and mediastinal contours are within normal limits. Both lungs are clear. The visualized skeletal structures are unremarkable.  IMPRESSION: No active disease.   Electronically Signed   By: Esperanza Heiraymond  Rubner M.D.   On: 10/08/2014 21:32   Dg Chest Port 1 View  09/30/2014   CLINICAL DATA:  Cough.  EXAM: PORTABLE CHEST - 1 VIEW  COMPARISON:  09/28/2014.  FINDINGS: Mediastinum hilar structures are normal. Prior CABG. Stable cardiomegaly. Interval improvement of pulmonary venous congestion. Mild bibasilar subsegmental atelectasis. Small left pleural effusion cannot be excluded. No pneumothorax. Degenerative changes thoracic spine  and both shoulders.  IMPRESSION: 1. Prior CABG. Stable cardiomegaly. Interim resolution of pulmonary venous congestion. 2. Mild bibasilar atelectasis. Small left pleural effusion cannot be excluded.   Electronically Signed   By: Maisie Fushomas  Register   On: 09/30/2014 07:23   Dg Abd 2 Views  10/18/2014   CLINICAL DATA:  Increased abdominal distention. Unable to communicate.  EXAM: ABDOMEN - 2 VIEW  COMPARISON:  09/20/2014 and abdomen and pelvis CT dated 09/23/2014.  FINDINGS: Gastrostomy tube in the expected position of the proximal to mid stomach. Normal bowel gas pattern without free peritoneal air. Lumbar and lower thoracic spine degenerative changes. Post CABG changes. Atheromatous arterial calcifications.  IMPRESSION: No acute abnormality.   Electronically Signed   By: Gordan PaymentSteve  Reid M.D.   On: 10/18/2014 13:28   Dg Abd Portable 1v  09/20/2014   CLINICAL DATA:  Feeding tube placement.  EXAM: PORTABLE ABDOMEN - 1 VIEW  COMPARISON:  09/09/14  FINDINGS: A feeding tube is now seen with the tip overlying the proximal body of the stomach. No evidence of dilated bowel loops.  IMPRESSION: Feeding tube tip overlies the proximal body of the stomach.   Electronically Signed   By: Myles RosenthalJohn  Stahl M.D.   On: 09/20/2014 19:20         Subjective: Patient opens her eyes but does not respond to any commands. No reports of vomiting, respiratory distress, uncontrolled pain. Tolerating her feeds.  Objective: Filed Vitals:   10/18/14 1757 10/18/14 2012 10/19/14 0440 10/19/14 1008  BP: 128/91 108/44 151/43 128/49  Pulse: 68 65 89 97  Temp: 98.4 F (36.9 C) 98.7 F (37.1 C) 98.7 F (37.1 C) 99.3 F (37.4 C)  TempSrc: Oral Oral Oral Oral  Resp: 19 18 17 18   Height:      Weight:  65.7 kg (144 lb 13.5 oz)    SpO2: 100% 100% 98% 100%    Intake/Output Summary (Last 24 hours) at 10/19/14 1317 Last data filed at 10/19/14 0900  Gross per 24 hour  Intake   1460 ml  Output      0 ml  Net   1460 ml   Weight change:  -0.434 kg (-15.3 oz) Exam:   General:  Pt is alert, does not follow commands appropriately, not in acute distress  HEENT: No icterus, No thrush, No meningismus, [place]/AT  Cardiovascular: RRR, S1/S2, no rubs, no gallops  Respiratory: CTA bilaterally, no wheezing, no crackles, no rhonchi  Abdomen: Soft/+BS, non tender, non distended, no guarding  Extremities: No edema, No lymphangitis, No petechiae, No rashes, no synovitis  Data Reviewed: Basic Metabolic Panel:  Recent Labs Lab 10/13/14 0515 10/14/14 0557 10/16/14 0504 10/18/14 0937  NA 139 137 139 138  K 4.1 4.2 4.3 4.5  CL 101 101 102 100  CO2 22 20 22 24   GLUCOSE 171* 144* 131* 93  BUN 33* 36* 35* 34*  CREATININE 1.02 0.96 0.84 0.79  CALCIUM 8.6 8.5 8.8 9.0   Liver Function Tests:  Recent Labs Lab 10/16/14 0504 10/18/14 0937  AST 29 24  ALT 35 27  ALKPHOS 93 86  BILITOT <0.2* 0.2*  PROT 6.9 6.7  ALBUMIN 2.3* 2.3*   No results for input(s): LIPASE, AMYLASE in the last 168 hours. No results for input(s): AMMONIA in the last 168 hours. CBC:  Recent Labs Lab 10/14/14 0557 10/16/14 0504 10/18/14 0937  WBC 12.5* 11.2* 10.6*  HGB 10.5* 10.3* 10.1*  HCT 33.6* 32.9* 31.5*  MCV 94.1 96.5 94.9  PLT 255 241 218   Cardiac Enzymes: No results for input(s): CKTOTAL, CKMB, CKMBINDEX, TROPONINI in the last 168 hours. BNP: Invalid input(s): POCBNP CBG:  Recent Labs Lab 10/18/14 2012 10/19/14 0018 10/19/14 0440 10/19/14 0755 10/19/14 1215  GLUCAP 88 165* 95 103* 136*    Recent Results (from the past 240 hour(s))  Clostridium Difficile by PCR     Status: None   Collection Time: 10/14/14  8:20 PM  Result Value Ref Range Status   C difficile by pcr NEGATIVE NEGATIVE Final     Scheduled Meds: . allopurinol  100 mg Per Tube BID  . amantadine  100 mg Per Tube BID  . amiodarone  200 mg Per Tube Daily  . antiseptic oral rinse  7 mL Mouth Rinse q12n4p  . aspirin  325 mg Per Tube Daily  . atorvastatin  80  mg Per Tube q1800  . chlorhexidine  15 mL Mouth Rinse BID  . diltiazem  30 mg Oral 4 times per day  . enoxaparin (LOVENOX) injection  40 mg Subcutaneous Q24H  . feeding supplement (VITAL AF 1.2 CAL)  280 mL Per Tube QID  . free water  300 mL Per Tube Q4H  . insulin aspart  0-15 Units Subcutaneous 6 times per day  . latanoprost  1 drop Right Eye QHS  . metoCLOPramide (REGLAN) injection  5 mg Intravenous 3 times per day  . metoprolol tartrate  100 mg Per Tube BID  . pantoprazole sodium  40 mg Per Tube Daily  . sodium chloride  3 mL Intravenous Q12H   Continuous Infusions:    Kimila Papaleo, DO  [facility] Pager [phone]  If 7PM-7AM, please contact night-coverage [email or web address] Password [email or web address] 10/19/2014, 1:17 PM   LOS: 11 days

## 2014-10-19 NOTE — Plan of Care (Signed)
Problem: Phase II Progression Outcomes Goal: Discharge plan established Outcome: Completed/Met Date Met:  10/19/14  Problem: Phase III Progression Outcomes Goal: Pain controlled on oral analgesia Outcome: Completed/Met Date Met:  10/19/14 Goal: Activity at appropriate level-compared to baseline (UP IN CHAIR FOR HEMODIALYSIS)  Outcome: Not Progressing Patient continues to be non verbal and does not follow commands. More alert today.  Goal: Afebrile, Vital Signs remain stable Outcome: Completed/Met Date Met:  10/19/14

## 2014-10-20 ENCOUNTER — Inpatient Hospital Stay (HOSPITAL_COMMUNITY): Payer: Medicare (Managed Care)

## 2014-10-20 DIAGNOSIS — Z515 Encounter for palliative care: Secondary | ICD-10-CM

## 2014-10-20 DIAGNOSIS — Z7189 Other specified counseling: Secondary | ICD-10-CM

## 2014-10-20 LAB — GLUCOSE, CAPILLARY
GLUCOSE-CAPILLARY: 105 mg/dL — AB (ref 70–99)
Glucose-Capillary: 94 mg/dL (ref 70–99)
Glucose-Capillary: 124 mg/dL — ABNORMAL HIGH (ref 70–99)
Glucose-Capillary: 91 mg/dL (ref 70–99)

## 2014-10-20 LAB — PROTIME-INR
INR: 1.06 (ref 0.00–1.49)
Prothrombin Time: 14 seconds (ref 11.6–15.2)

## 2014-10-20 LAB — APTT: APTT: 31 s (ref 24–37)

## 2014-10-20 NOTE — Care Management Note (Signed)
CARE MANAGEMENT NOTE 10/20/2014  Patient:  [name],Kaleena   Account Number:  [id number]  Date Initiated:  10/10/2014  Documentation initiated by:  Akia Montalban  Subjective/Objective Assessment:   CM following for progression and d/c planning.     Action/Plan:   10/10/2014 Pt is SNF resident and plan if for her to return to SNF for rehab.  10/18/2015 Still with significant decreased LOC, plan for SNF vs residentlial Hospice.   Anticipated DC Date:  10/22/2014   Anticipated DC Plan:  SKILLED NURSING FACILITY         Choice offered to / List presented to:             Status of service:  Completed, signed off Medicare Important Message given?  YES (If response is "NO", the following Medicare IM given date fields will be blank) Date Medicare IM given:  10/10/2014 Medicare IM given by:  Saphronia Ozdemir Date Additional Medicare IM given:  10/17/2014 Additional Medicare IM given by:  [facility]  Discharge Disposition:    Per UR Regulation:    If discussed at Long Length of Stay Meetings, dates discussed:    Comments:  10/20/2014 IM given, no family present. CM continuing to follow.  CRoyal RN MPH, case manager, [phone]

## 2014-10-20 NOTE — Progress Notes (Signed)
Blood pressure 93/52, pulse 86, temperature 98.7 F (37.1 C). Per Schorr NP, hold metoprolol and cardizem for tonight. Pt asymptomatic. Will continue to monitor. [name], Jovany Disano J

## 2014-10-20 NOTE — Progress Notes (Signed)
EEG completed; results pending.    

## 2014-10-20 NOTE — Progress Notes (Addendum)
NEURO HOSPITALIST PROGRESS NOTE   SUBJECTIVE:                                                                                                                        Susan Calhoun is quite awake, tracks me around the room, but is not following commands and is non-verbal Resumed amantadine. She is off benzos, SSRI's, or antipsychotics.  Labs are unimpressive. She remains afebrile. Repeat Head CT unremarkable.  OBJECTIVE:                                                                                                                           Vital signs in last 24 hours: Temp:  [98.3 F (36.8 C)-99.3 F (37.4 C)] 98.3 F (36.8 C) (11/30 0522) Pulse Rate:  [74-97] 80 (11/30 0522) Resp:  [18-19] 18 (11/30 0522) BP: (97-135)/(28-49) 113/28 mmHg (11/30 0522) SpO2:  [93 %-100 %] 93 % (11/30 0522) Weight:  [69.3 kg (152 lb 12.5 oz)] 69.3 kg (152 lb 12.5 oz) (11/29 2030)  Intake/Output from previous day:   Intake/Output this shift:   Nutritional status: Diet NPO time specified  Past Medical History  Diagnosis Date  . Diabetes mellitus with diabetic polyneuropathy   . A-fib   . Renal disorder   . Hypertension   . Anemia   . Glaucoma     right eye   . Carotid artery stenosis   . PVD (peripheral vascular disease)    Physical exam: awake, no apparent distress. Head: normocephalic. Neck: supple, no bruits, no JVD. Cardiac: no murmurs. Lungs: clear. Abdomen: soft, no tender, no mass. Extremities: no edema.  Neurologic Exam:  General: Mental Status: Awake but doesn't follow commands. Non-verbal II: Visual fields grossly normal, pupils unequal (old finding s/p surgery) reactive to light III,IV, VI: ptosis not present, extra-ocular motions intact bilaterally V,VII: smile symmetric, facial light touch sensation unreliable on testing. VIII: hearing seems to be grossly normal bilaterally IX,X: gag reflex present XI: bilateral shoulder shrug no  tested XII: doesn't open her mouth and thus can not test Motor: Moving all extremities left>right Tone and bulk:normal tone throughout; no atrophy noted Sensory: withdraws to pain Deep Tendon Reflexes:  1 all over Plantars: No tested Cerebellar: Unable to test. Gait:  Unable to test.  Lab Results: Lab Results  Component Value Date/Time   CHOL 204* 09/02/2014 05:30 AM   Lipid Panel No results for input(s): CHOL, TRIG, HDL, CHOLHDL, VLDL, LDLCALC in the last 72 hours.  Studies/Results: Ct Head Wo Contrast  10/19/2014   CLINICAL DATA:  Encephalopathy.  Altered mental status.  EXAM: CT HEAD WITHOUT CONTRAST  TECHNIQUE: Contiguous axial images were obtained from the base of the skull through the vertex without intravenous contrast.  COMPARISON:  Brain MRI 10/18/2014.  Head CT 10/08/2014.  FINDINGS: Right frontal burr hole and old catheter tract in the right frontal lobe are noted. Moderate ventricular dilatation does not appear significantly changed. 3 mm right parietal cortical hyperdensity is unchanged over multiple prior studies, favored to represent calcification rather than acute blood products. There is no definite evidence of acute intracranial hemorrhage. There is no evidence of acute large territory infarct, mass, midline shift, or extra-axial fluid collection. Periventricular white-matter hypodensities are unchanged and nonspecific but may reflect moderate chronic small vessel ischemic disease.  Prior bilateral cataract extraction is noted. Mastoid air cells are clear. There is moderate left sphenoid sinus mucosal thickening, mildly increased from prior CT. Sphenoid sinus wall thickening is suggestive of chronic sinusitis. Hyperostosis frontalis is noted.  IMPRESSION: 1. No acute intracranial abnormality identified. 2. Unchanged ventricular enlargement and periventricular white matter low attenuation, nonspecific but may reflect chronic small vessel ischemic disease.   Electronically  Signed   By: Sebastian AcheAllen  Grady   On: 10/19/2014 17:59   Dg Abd 2 Views  10/18/2014   CLINICAL DATA:  Increased abdominal distention. Unable to communicate.  EXAM: ABDOMEN - 2 VIEW  COMPARISON:  09/20/2014 and abdomen and pelvis CT dated 09/23/2014.  FINDINGS: Gastrostomy tube in the expected position of the proximal to mid stomach. Normal bowel gas pattern without free peritoneal air. Lumbar and lower thoracic spine degenerative changes. Post CABG changes. Atheromatous arterial calcifications.  IMPRESSION: No acute abnormality.   Electronically Signed   By: Gordan PaymentSteve  Reid M.D.   On: 10/18/2014 13:28    MEDICATIONS                                                                                                                        Scheduled: . allopurinol  100 mg Per Tube BID  . amantadine  100 mg Per Tube BID  . amiodarone  200 mg Per Tube Daily  . antiseptic oral rinse  7 mL Mouth Rinse q12n4p  . aspirin  325 mg Per Tube Daily  . atorvastatin  80 mg Per Tube q1800  . chlorhexidine  15 mL Mouth Rinse BID  . diltiazem  30 mg Oral 4 times per day  . enoxaparin (LOVENOX) injection  40 mg Subcutaneous Q24H  . feeding supplement (VITAL AF 1.2 CAL)  280 mL Per Tube QID  . free water  300 mL Per Tube Q4H  . insulin aspart  0-15 Units Subcutaneous 6 times per day  . latanoprost  1 drop Right Eye QHS  . metoCLOPramide (REGLAN) injection  5 mg Intravenous 3 times per day  . metoprolol tartrate  100 mg Per Tube BID  . pantoprazole sodium  40 mg Per Tube Daily  . sodium chloride  3 mL Intravenous Q12H    ASSESSMENT/PLAN:                                                                                                           80 y/o with recent non-dominant IVH secondary to coumadin associated coagulopathy in setting of uncontrolled hypertension, re admitted with an encephalopathy of unclear etiology (although has UTI on antimicrobials). Multiple EEGs showing triphasic waves but no epileptiform  activity.  Patient appears alert, tracking, making eye contact. Remains non-verbal and not following commands. Have concern that this may represent her new baseline mental status. Have low suspicion for an underlying infectious process, unclear if there is a diagnostic benefit to LP at this time.  -will repeat EEG. Will consider starting VPA if triphasic waves still present -pending EEG results will discuss LP with family    Elspeth Choeter Elber Galyean, DO [facility] [phone]  If 7pm- 7am, please page neurology on call as listed in AMION.   10/20/2014, 8:40 AM

## 2014-10-20 NOTE — Plan of Care (Signed)
Problem: Acute Rehab OT Goals (only OT should resolve) Goal: Pt. Will Perform Grooming Outcome: Not Met (add Reason) Goal: Pt. Will Perform Upper Body Bathing Outcome: Not Met (add Reason) Goal: Pt. Will Perform Upper Body Dressing Outcome: Not Met (add Reason) Goal: OT Additional ADL Goal #1 Outcome: Not Met (add Reason)

## 2014-10-20 NOTE — Procedures (Signed)
History: 80 year old female with altered mental status  Sedation: None  Technique: This is a 17 channel routine scalp EEG performed at the bedside with bipolar and monopolar montages arranged in accordance to the international 10/20 system of electrode placement. One channel was dedicated to EKG recording.    Background: The background consists predominantly of irregular delta and theta activity. The maximal posterior dominant rhythm is 7.5 Hz. There is an increase in delta assocaited with drowsiness.   Photic stimulation: Physiologic driving is not performed.   EEG Abnormalities: 1) Generalized irregular slow activity 2) Slow PDR   Clinical Interpretation: This EEG is consistent with a generalized non-specific cerebral dysfunction(encephalopathy). Compared to previous recording there has been improvement as noted by increased faster activity and attenuation of triphasic waves. There was no seizure or seizure predisposition recorded on this study.   Ritta SlotMcNeill Wendal Wilkie, MD [facility] [phone]  If 7pm- 7am, please page neurology on call as listed in AMION.

## 2014-10-20 NOTE — Progress Notes (Signed)
PROGRESS NOTE  Susan Calhoun WUJ:[id number] DOB: 11/09/1928 DOA: 10/08/2014 PCP: Pcp Not In System  Brief history 80 y.o. female with history of diabetes mellitus, chronic kidney disease, hypertension, peripheral vascular disease, recent CVA with intraventricular hemorrhage status post drain placement and removal. The patient was brought in from her nursing facility by her daughter due to increasing confusion. She was discharged to the skilled nursing facility on 09/30/2014. The patient had been able to tolerate oral feeding as well as enteral feeding. For 24-48 hours prior to admission, the patient had been much less responsive to verbal stimuli. Neurology was consulted. MRI of the brain was obtained and did not reveal any acute findings. Chest x-ray was negative. The patient was found to have pyuria and was diagnosed with a urinary tract infection with Klebsiella. Her antibiotics were adjusted, and the patient was started on meropenem which she finished 7 days during the hospitalization. In addition, the patient's acute kidney injury also improved. Unfortunately, the patient's mental status only improved minimally. As result, palliative medicine was consulted. The family struggled to make a decision with regard to transitioning to comfort care as the patient was independent as recent as 6 weeks ago. Neurology was reconsulted. Workup for reversible causes of delirium were unremarkable. Repeat EEGs including long-term monitoring did not reveal any epileptiform activity. She was started on amantadine on 10/17/2014 evening. Unfortunately, the patient's mental status did not significantly improve. Repeat EEG was performed on 10/20/2014. Assessment/Plan: Acute encephalopathy -In the setting of recent IVH s/p drain--October 2015 --admitted with recent CVA with IVH, hydrocephalous and h/o temporary drain with fluctuating mental status then, went to SNF 11/10. -She had multiple EEGs including  LTM done showing general slowing and frequent intermittent triphasic waves. She did have generalized tremors during hospital stay with no EEG correlate --MRI brain without acute changes -was on Cefepime for UTI, FU Urine Cx- with Klebsiella, resistant to Cephalosporins, hence changed to Meropenem 11/21 -unfortunately continues to be encephalopathic with minimal improvement -ammonia--14 -check RPR, B12, folate, am cortisol--all neg/unremarkable -TSH 2.060 -10/16/2014--very little improvement. Patient opens eyes but does not follow commands  -palliative medicine was consulted -10/17/14--as the patient's family continued to struggle with the patient's decline--patient's family has requested to speak with the neurologist -10/17/2014--Reconsulted neurology--case was discussed with Dr. Camilo-->started amantidine 10/17/14 evening -10/20/2014 EEG performed -Placed for possible LP if no improvement Gastroparesis -Patient had significant to feed residuals on 10/18/2014 -May be due to anticholinergic side effects of amantadine -Started Reglan-->improved -10/18/2014 2 view abdominal x-ray negative for ileus or obstruction AKI -resolved -Baseline creatinine 0.9-1.2 Dysphagia -in setting of recent IVH   -continue enteral feeds--tolerating well -10/18/2014 abdominal x-ray negative for any ileus or obstruction Atrial fibrillation -not an AC candidate -continue metoprolol tartrate and diltiazem -Continue amiodarone -presently in sinus UTI -Klebsiella pneumoniae -continue Merrem (started 11/21)--finished last dose on 10/18/2014 Diarrhea -C.diff neg -likely due to enteral feeds Hypertension -BP has been labile but acceptable presently -Continue metoprolol tartrate and diltiazem   Family Communication: Family updated at beside Disposition Plan: SNF    Procedures/Studies: Ct Abdomen Wo Contrast  09/23/2014   CLINICAL DATA:  Malnutrition.  Anatomy for PEG tube placement.  EXAM: CT  ABDOMEN WITHOUT CONTRAST  TECHNIQUE: Multidetector CT imaging of the abdomen was performed following the standard protocol without IV contrast.  COMPARISON:  None.  FINDINGS: LOWER CHEST: Extensive coronary atherosclerosis. Partially visualized aortic valve sclerosis/calcification.  Small and layering bilateral pleural effusions with passive  atelectasis.  ABDOMEN/PELVIS:  Liver: No focal abnormality.  Biliary: Sludge or other layering high density within the gallbladder. No distention.  Pancreas: Unremarkable.  Spleen: Unremarkable.  Adrenals: Unremarkable.  Kidneys and ureters: No hydronephrosis.  Bowel: Feeding tube tip terminates in the proximal stomach. The non insufflated stomach minimally overlaps with the left lobe of the liver. The transverse colon is well inferior to the stomach. There is no evidence of gastric mass or displacement. No abdominal wall hernia or other impediment. No bowel obstruction.  Retroperitoneum: No mass or adenopathy.  Peritoneum: No ascites or pneumoperitoneum.  Vascular: Extensive atherosclerotic calcification.  OSSEOUS: No acute abnormalities.  IMPRESSION: 1. Normal gastric morphology and position. Surrounding anatomy described above. 2. Small pleural effusions with passive atelectasis.   Electronically Signed   By: Tiburcio PeaJonathan  Watts M.D.   On: 09/23/2014 10:05   Dg Chest 2 View  09/28/2014   CLINICAL DATA:  Fever, history of hypertension  EXAM: CHEST  2 VIEW  COMPARISON:  09/17/2014  FINDINGS: Evidence of CABG. Lungs are hypoaerated with crowding of the bronchovascular markings. Mild cardiomegaly reidentified with central vascular congestion but no overt edema. No new focal pulmonary opacity. Trace pleural effusions are noted.  IMPRESSION: Mild cardiomegaly with central vascular congestion and trace pleural fluid.   Electronically Signed   By: Christiana PellantGretchen  Green M.D.   On: 09/28/2014 15:45   Ct Head Wo Contrast  10/19/2014   CLINICAL DATA:  Encephalopathy.  Altered mental status.   EXAM: CT HEAD WITHOUT CONTRAST  TECHNIQUE: Contiguous axial images were obtained from the base of the skull through the vertex without intravenous contrast.  COMPARISON:  Brain MRI 10/18/2014.  Head CT 10/08/2014.  FINDINGS: Right frontal burr hole and old catheter tract in the right frontal lobe are noted. Moderate ventricular dilatation does not appear significantly changed. 3 mm right parietal cortical hyperdensity is unchanged over multiple prior studies, favored to represent calcification rather than acute blood products. There is no definite evidence of acute intracranial hemorrhage. There is no evidence of acute large territory infarct, mass, midline shift, or extra-axial fluid collection. Periventricular white-matter hypodensities are unchanged and nonspecific but may reflect moderate chronic small vessel ischemic disease.  Prior bilateral cataract extraction is noted. Mastoid air cells are clear. There is moderate left sphenoid sinus mucosal thickening, mildly increased from prior CT. Sphenoid sinus wall thickening is suggestive of chronic sinusitis. Hyperostosis frontalis is noted.  IMPRESSION: 1. No acute intracranial abnormality identified. 2. Unchanged ventricular enlargement and periventricular white matter low attenuation, nonspecific but may reflect chronic small vessel ischemic disease.   Electronically Signed   By: Sebastian AcheAllen  Grady   On: 10/19/2014 17:59   Ct Head Wo Contrast  10/08/2014   CLINICAL DATA:  Increasing weakness and lethargy since Saturday, recent UTIs, personal history of diabetes, renal disorder, hypertension  EXAM: CT HEAD WITHOUT CONTRAST  TECHNIQUE: Contiguous axial images were obtained from the base of the skull through the vertex without intravenous contrast.  COMPARISON:  09/26/2014  FINDINGS: Minimal atrophy.  Mild prominence of the ventricular system little changed.  Small vessel chronic ischemic changes of deep cerebral white matter.  Intraventricular blood identified at  the occipital horns of the lateral ventricles on the previous exam no longer identified.  Focus of high attenuation at the RIGHT parietal lobe likely tiny calcification, unchanged since 09/04/2014 making this unlikely to represent blood.  No definite high attenuation acute intracranial hemorrhage, mass lesion or evidence acute infarction.  No extra-axial fluid collections.  Atherosclerotic calcifications  at the carotid siphons.  Scattered motion artifacts.  Prior burr hole RIGHT frontal.  Hyperostosis frontalis interna.  No acute osseous findings.  IMPRESSION: Motion artifacts.  Atrophy with mild persistent prominence of the ventricular system and small vessel chronic ischemic changes.  Resolution of minimal intraventricular blood seen on the previous exam.   Electronically Signed   By: Ulyses Southward M.D.   On: 10/08/2014 22:13   Ct Head Wo Contrast  09/26/2014   CLINICAL DATA:  Progressive cognitive decline over the last 2 days.  EXAM: CT HEAD WITHOUT CONTRAST  TECHNIQUE: Contiguous axial images were obtained from the base of the skull through the vertex without intravenous contrast.  COMPARISON:  CT head without contrast 09/14/2014.  FINDINGS: Minimal blood products are still seen dependently within the posterior horns of the lateral ventricles. There is no new hemorrhage. Ventricular enlargement is similar to the prior study although the temporal horns may be slightly more prominent. No acute cortical infarct, hemorrhage, or other mass lesion is present. Mild mucosal thickening is present in the sphenoid sinuses bilaterally. The remaining paranasal sinuses and the mastoid air cells are clear.  IMPRESSION: 1. Near complete resolution of layering blood products in the posterior horn of the lateral ventricles bilaterally. 2. Stable ventricular enlargement. 3. No new hemorrhage. 4. Stable atrophy and moderate white matter disease.   Electronically Signed   By: Gennette Pac M.D.   On: 09/26/2014 18:20   Mr Brain  Wo Contrast  10/10/2014   CLINICAL DATA:  Encephalopathy. Intraventricular hemorrhage and drain placement last month.  EXAM: MRI HEAD WITHOUT CONTRAST  TECHNIQUE: Multiplanar, multiecho pulse sequences of the brain and surrounding structures were obtained without intravenous contrast.  COMPARISON:  Head CT 09/28/2014 and MRI 09/12/2014  FINDINGS: Images are mildly to moderately degraded by motion artifact.  No acute infarct is identified. There is a small amount of diffusion weighted signal abnormality along the prior right frontal ventriculostomy catheter tract likely secondary to prior intervention. No sizable intracranial hemorrhage is identified, although T2* images are particularly motion degraded. There may be minimal residual chronic blood products in the occipital horns.  The lateral and third ventricles remain moderately dilated but overall slightly improved compared to the prior MRI. Mild T2 hyperintensity in the periventricular white matter bilaterally does not appear significantly changed, nonspecific. No mass, midline shift, or extra-axial fluid collection is identified.  Prior bilateral cataract extraction is noted. There is mild right greater the left sphenoid sinus mucosal thickening. Mastoid air cells are clear. Major intracranial vascular flow voids are preserved.  IMPRESSION: 1. No evidence acute/new intracranial abnormality. 2. Slightly decreased ventricular dilatation since 09/12/2014 MRI.   Electronically Signed   By: Sebastian Ache   On: 10/10/2014 12:22   Ir Gastrostomy Tube Mod Sed  09/23/2014   INDICATION: Patient admitted with intraventricular/subarachnoid hemorrhage with persistent altered mental status and now with dysphagia. Request made for percutaneous gastrostomy tube placement for enteric nutrition.  EXAM: PULL TROUGH GASTROSTOMY TUBE PLACEMENT  COMPARISON:  CT abdomen and pelvis - 09/23/2014  MEDICATIONS: The patient is currently admitted to the hospital receiving intravenous  antibiotics. The antibiotics were administered within an appropriate time frame prior to initiation of the procedure.  CONTRAST:  15 mL of Isovue 300 administered into the gastric lumen.  ANESTHESIA/SEDATION: Versed 1 mg IV; Fentanyl 50 mcg IV  Sedation time  25 minutes  FLUOROSCOPY TIME:  4 minutes.  COMPLICATIONS: None immediate  PROCEDURE: Informed written consent was obtained from the patient's daughter  following explanation of the procedure, risks, benefits and alternatives. A time out was performed prior to the initiation of the procedure. Ultrasound scanning was performed to demarcate the edge of the left lobe of the liver. Maximal barrier sterile technique utilized including caps, mask, sterile gowns, sterile gloves, large sterile drape, hand hygiene and Betadine prep.  The left upper quadrant was sterilely prepped and draped. An oral gastric catheter was inserted into the stomach under fluoroscopy. The existing nasogastric feeding tube was removed. The left costal margin and air opacified transverse colon were identified and avoided. Air was injected into the stomach for insufflation and visualization under fluoroscopy. Under sterile conditions a 17 gauge trocar needle was utilized to access the stomach percutaneously beneath the left subcostal margin after the overlying soft tissues were anesthetized with 1% Lidocaine with epinephrine. Needle position was confirmed within the stomach with aspiration of air and injection of small amount of contrast. A single T tack was deployed for gastropexy. Over an Amplatz guide wire, a 9-French sheath was inserted into the stomach. A snare device was utilized to capture the oral gastric catheter. The snare device was pulled retrograde from the stomach up the esophagus and out the oropharynx. The 20-French pull-through gastrostomy was connected to the snare device and pulled antegrade through the oropharynx down the esophagus into the stomach and then through the  percutaneous tract external to the patient. The gastrostomy was assembled externally. Contrast injection confirms position in the stomach. Several spot radiographic images were obtained in various obliquities for documentation. The patient tolerated procedure well without immediate post procedural complication.  FINDINGS: After successful fluoroscopic guided placement, the gastrostomy tube is appropriately positioned with internal disc against the ventral aspect of the gastric lumen.  IMPRESSION: Successful fluoroscopic insertion of a 20-French pull-through gastrostomy tube.  The gastrostomy may be used immediately for medication administration and in 24 hrs for the initiation of feeds.   Electronically Signed   By: Simonne Come M.D.   On: 09/23/2014 14:55   Dg Chest Port 1 View  10/08/2014   CLINICAL DATA:  Initial evaluation for weakness and lethargy for 3 days with altered mental status  EXAM: PORTABLE CHEST - 1 VIEW  COMPARISON:  09/30/2014  FINDINGS: The heart size and mediastinal contours are within normal limits. Both lungs are clear. The visualized skeletal structures are unremarkable.  IMPRESSION: No active disease.   Electronically Signed   By: Esperanza Heir M.D.   On: 10/08/2014 21:32   Dg Chest Port 1 View  09/30/2014   CLINICAL DATA:  Cough.  EXAM: PORTABLE CHEST - 1 VIEW  COMPARISON:  09/28/2014.  FINDINGS: Mediastinum hilar structures are normal. Prior CABG. Stable cardiomegaly. Interval improvement of pulmonary venous congestion. Mild bibasilar subsegmental atelectasis. Small left pleural effusion cannot be excluded. No pneumothorax. Degenerative changes thoracic spine and both shoulders.  IMPRESSION: 1. Prior CABG. Stable cardiomegaly. Interim resolution of pulmonary venous congestion. 2. Mild bibasilar atelectasis. Small left pleural effusion cannot be excluded.   Electronically Signed   By: Maisie Fus  Register   On: 09/30/2014 07:23   Dg Abd 2 Views  10/18/2014   CLINICAL DATA:   Increased abdominal distention. Unable to communicate.  EXAM: ABDOMEN - 2 VIEW  COMPARISON:  09/20/2014 and abdomen and pelvis CT dated 09/23/2014.  FINDINGS: Gastrostomy tube in the expected position of the proximal to mid stomach. Normal bowel gas pattern without free peritoneal air. Lumbar and lower thoracic spine degenerative changes. Post CABG changes. Atheromatous arterial calcifications.  IMPRESSION:  No acute abnormality.   Electronically Signed   By: Gordan Payment M.D.   On: 10/18/2014 13:28   Dg Abd Portable 1v  09/20/2014   CLINICAL DATA:  Feeding tube placement.  EXAM: PORTABLE ABDOMEN - 1 VIEW  COMPARISON:  09/09/14  FINDINGS: A feeding tube is now seen with the tip overlying the proximal body of the stomach. No evidence of dilated bowel loops.  IMPRESSION: Feeding tube tip overlies the proximal body of the stomach.   Electronically Signed   By: Myles Rosenthal M.D.   On: 09/20/2014 19:20         Subjective: Patient opens eyes to verbal stimuli and occasionally tracks. Does not follow commands or answer questions. No reports of vomiting, respiratory distress, I to residuals, diarrhea  Objective: Filed Vitals:   10/19/14 2030 10/20/14 0046 10/20/14 0522 10/20/14 0926  BP: 97/33 127/40 113/28 123/65  Pulse: 78 75 80 66  Temp: 98.7 F (37.1 C)  98.3 F (36.8 C) 98.9 F (37.2 C)  TempSrc: Oral  Oral Oral  Resp: 18  18 17   Height:      Weight: 69.3 kg (152 lb 12.5 oz)     SpO2: 99%  93% 99%    Intake/Output Summary (Last 24 hours) at 10/20/14 1602 Last data filed at 10/20/14 2130  Gross per 24 hour  Intake      0 ml  Output      0 ml  Net      0 ml   Weight change: 3.6 kg (7 lb 15 oz) Exam:   General:  Pt is alert, follows commands appropriately, not in acute distress  HEENT: No icterus, No thrush, No meningismus, [place]/AT  Cardiovascular: RRR, S1/S2, no rubs, no gallops  Respiratory: Diminished breath sounds at the bases but Clear to auscultation  Abdomen: Soft/+BS,  non tender, non distended, no guarding  Extremities: No edema, No lymphangitis, No petechiae, No rashes, no synovitis  Data Reviewed: Basic Metabolic Panel:  Recent Labs Lab 10/14/14 0557 10/16/14 0504 10/18/14 0937  NA 137 139 138  K 4.2 4.3 4.5  CL 101 102 100  CO2 20 22 24   GLUCOSE 144* 131* 93  BUN 36* 35* 34*  CREATININE 0.96 0.84 0.79  CALCIUM 8.5 8.8 9.0   Liver Function Tests:  Recent Labs Lab 10/16/14 0504 10/18/14 0937  AST 29 24  ALT 35 27  ALKPHOS 93 86  BILITOT <0.2* 0.2*  PROT 6.9 6.7  ALBUMIN 2.3* 2.3*   No results for input(s): LIPASE, AMYLASE in the last 168 hours. No results for input(s): AMMONIA in the last 168 hours. CBC:  Recent Labs Lab 10/14/14 0557 10/16/14 0504 10/18/14 0937  WBC 12.5* 11.2* 10.6*  HGB 10.5* 10.3* 10.1*  HCT 33.6* 32.9* 31.5*  MCV 94.1 96.5 94.9  PLT 255 241 218   Cardiac Enzymes: No results for input(s): CKTOTAL, CKMB, CKMBINDEX, TROPONINI in the last 168 hours. BNP: Invalid input(s): POCBNP CBG:  Recent Labs Lab 10/19/14 2005 10/19/14 2355 10/20/14 0352 10/20/14 0742 10/20/14 1232  GLUCAP 129* 140* 105* 94 124*    Recent Results (from the past 240 hour(s))  Clostridium Difficile by PCR     Status: None   Collection Time: 10/14/14  8:20 PM  Result Value Ref Range Status   C difficile by pcr NEGATIVE NEGATIVE Final     Scheduled Meds: . allopurinol  100 mg Per Tube BID  . amantadine  100 mg Per Tube BID  . amiodarone  200 mg Per Tube Daily  . antiseptic oral rinse  7 mL Mouth Rinse q12n4p  . aspirin  325 mg Per Tube Daily  . atorvastatin  80 mg Per Tube q1800  . chlorhexidine  15 mL Mouth Rinse BID  . diltiazem  30 mg Oral 4 times per day  . enoxaparin (LOVENOX) injection  40 mg Subcutaneous Q24H  . feeding supplement (VITAL AF 1.2 CAL)  280 mL Per Tube QID  . free water  300 mL Per Tube Q4H  . insulin aspart  0-15 Units Subcutaneous 6 times per day  . latanoprost  1 drop Right Eye QHS  .  metoCLOPramide (REGLAN) injection  5 mg Intravenous 3 times per day  . metoprolol tartrate  100 mg Per Tube BID  . pantoprazole sodium  40 mg Per Tube Daily  . sodium chloride  3 mL Intravenous Q12H   Continuous Infusions:    Chandlar Guice, DO  [facility] Pager [phone]  If 7PM-7AM, please contact night-coverage [email or web address] Password [email or web address] 10/20/2014, 4:02 PM   LOS: 12 days

## 2014-10-20 NOTE — Progress Notes (Signed)
Occupational Therapy Treatment and Discharge Patient Details Name: Susan Calhoun MRN: [id number] DOB: 03/17/1928 Today's Date: 10/20/2014    History of present illness 80 y.o. female with Past medical history of diabetes mellitus, chronic kidney disease, hypertension, peripheral vascular disease, recent CVA with intraventricular hemorrhage status post drain placement and removal. She has a PEG from previous admission. Pt. found to have UTI. CXR No active disease.   OT comments  Pt with sustained alertness x 5 minutes. Following OT with her eyes around room and tolerating positioning in bed and PROM B UEs.  Plan to defer further OT to the discretion of SNF due to decreased ability to participate at this time.  Follow Up Recommendations  SNF;Supervision/Assistance - 24 hour    Equipment Recommendations       Recommendations for Other Services      Precautions / Restrictions Precautions Precautions: Fall       Mobility Bed Mobility  + 2 total assist to position in bed                Transfers                      Balance                                   ADL                                         General ADL Comments: dependent, PEG tube      Vision                     Perception     Praxis      Cognition   Behavior During Therapy: Flat affect Overall Cognitive Status: Difficult to assess Area of Impairment: Attention;Following commands   Current Attention Level: Focused    Following Commands:  (not following commands)       General Comments: Pt awakened easily and followed OT around room with her eyes and held eye contact for approximately 5 minutes.    Extremity/Trunk Assessment               Exercises     Shoulder Instructions       General Comments      Pertinent Vitals/ Pain       Pain Assessment: Faces Faces Pain Scale: No hurt  Home Living                                           Prior Functioning/Environment              Frequency       Progress Toward Goals  OT Goals(current goals can now be found in the care plan section)  Progress towards OT goals: Not progressing toward goals - comment (pt with decreased arousal, not following commands)  Acute Rehab OT Goals Patient Stated Goal: unable to state  Plan Discharge plan remains appropriate    Co-evaluation                 End of Session     Activity Tolerance Patient limited by lethargy   Patient Left in bed;with call  bell/phone within reach;with bed alarm set   Nurse Communication  (plan for discharge from OT)        Time: 1157-1209 OT Time Calculation (min): 12 min  Charges: OT General Charges $OT Visit: 1 Procedure OT Treatments $Therapeutic Activity: 8-22 mins  Evern BioMayberry, Reva Pinkley Lynn 10/20/2014, 1:11 PM  [phone]

## 2014-10-21 LAB — GLUCOSE, CAPILLARY
GLUCOSE-CAPILLARY: 124 mg/dL — AB (ref 70–99)
GLUCOSE-CAPILLARY: 142 mg/dL — AB (ref 70–99)
Glucose-Capillary: 146 mg/dL — ABNORMAL HIGH (ref 70–99)
Glucose-Capillary: 100 mg/dL — ABNORMAL HIGH (ref 70–99)
Glucose-Capillary: 101 mg/dL — ABNORMAL HIGH (ref 70–99)
Glucose-Capillary: 89 mg/dL (ref 70–99)

## 2014-10-21 MED ORDER — ASPIRIN 325 MG PO TABS: 325.0000 mg | ORAL_TABLET | Freq: Every day | ORAL | Status: AC

## 2014-10-21 MED ORDER — VITAL AF 1.2 CAL PO LIQD: 280.0000 mL | Freq: Four times a day (QID) | ORAL | Status: DC

## 2014-10-21 MED ORDER — METOCLOPRAMIDE HCL 5 MG PO TABS: 5.0000 mg | ORAL_TABLET | Freq: Four times a day (QID) | ORAL | Status: DC

## 2014-10-21 MED ORDER — AMANTADINE HCL 50 MG/5ML PO SYRP: 100.0000 mg | ORAL_SOLUTION | Freq: Two times a day (BID) | ORAL | Status: AC

## 2014-10-21 NOTE — Progress Notes (Addendum)
NEURO HOSPITALIST PROGRESS NOTE   SUBJECTIVE:                                                                                                                        Susan Calhoun is quite awake, tracks me around the room, per RN notes she began to minimally talk and follow commands over night. Currently resting comfortably. Resumed amantadine. She remains afebrile. Repeat Head CT unremarkable.   EEG: This EEG is consistent with a generalized non-specific cerebral dysfunction(encephalopathy). Compared to previous recording there has been improvement as noted by increased faster activity and attenuation of triphasic waves. There was no seizure or seizure predisposition recorded on this study.  OBJECTIVE:                                                                                                                           Vital signs in last 24 hours: Temp:  [98.1 F (36.7 C)-98.9 F (37.2 C)] 98.1 F (36.7 C) (12/01 0545) Pulse Rate:  [66-98] 98 (12/01 0545) Resp:  [16-18] 16 (12/01 0545) BP: (95-126)/(56-69) 126/69 mmHg (12/01 0545) SpO2:  [97 %-100 %] 100 % (12/01 0545)  Intake/Output from previous day: 11/30 0701 - 12/01 0700 In: 1180 [NG/GT:1180] Out: -  Intake/Output this shift:   Nutritional status: Diet NPO time specified  Past Medical History  Diagnosis Date  . Diabetes mellitus with diabetic polyneuropathy   . A-fib   . Renal disorder   . Hypertension   . Anemia   . Glaucoma     right eye   . Carotid artery stenosis   . PVD (peripheral vascular disease)    Physical exam: awake, no apparent distress. Head: normocephalic. Neck: supple, no bruits, no JVD. Cardiac: no murmurs. Lungs: clear. Abdomen: soft, no tender, no mass. Extremities: no edema.  Neurologic Exam:  General: Mental Status: Awake tracks and makes eye contact. Attempts verbal output, makes minimal unintelligible speech. Will shake head yes in response to  "is your name Ashari". Not following commands II: Visual fields grossly normal, pupils unequal (old finding s/p surgery) reactive to light III,IV, VI: ptosis not present, extra-ocular motions intact bilaterally V,VII: smile symmetric, facial light touch sensation unreliable on testing. VIII: hearing seems to be grossly  normal bilaterally IX,X: gag reflex present XI: bilateral shoulder shrug no tested XII: doesn't open her mouth and thus can not test Motor: Moving all extremities left>right Tone and bulk:normal tone throughout; no atrophy noted Sensory: withdraws to pain Deep Tendon Reflexes:  1 all over Plantars: No tested Cerebellar: Unable to test. Gait: Unable to test.  Lab Results: Lab Results  Component Value Date/Time   CHOL 204* 09/02/2014 05:30 AM   Lipid Panel No results for input(s): CHOL, TRIG, HDL, CHOLHDL, VLDL, LDLCALC in the last 72 hours.  Studies/Results: Ct Head Wo Contrast  10/19/2014   CLINICAL DATA:  Encephalopathy.  Altered mental status.  EXAM: CT HEAD WITHOUT CONTRAST  TECHNIQUE: Contiguous axial images were obtained from the base of the skull through the vertex without intravenous contrast.  COMPARISON:  Brain MRI 10/18/2014.  Head CT 10/08/2014.  FINDINGS: Right frontal burr hole and old catheter tract in the right frontal lobe are noted. Moderate ventricular dilatation does not appear significantly changed. 3 mm right parietal cortical hyperdensity is unchanged over multiple prior studies, favored to represent calcification rather than acute blood products. There is no definite evidence of acute intracranial hemorrhage. There is no evidence of acute large territory infarct, mass, midline shift, or extra-axial fluid collection. Periventricular white-matter hypodensities are unchanged and nonspecific but may reflect moderate chronic small vessel ischemic disease.  Prior bilateral cataract extraction is noted. Mastoid air cells are clear. There is moderate  left sphenoid sinus mucosal thickening, mildly increased from prior CT. Sphenoid sinus wall thickening is suggestive of chronic sinusitis. Hyperostosis frontalis is noted.  IMPRESSION: 1. No acute intracranial abnormality identified. 2. Unchanged ventricular enlargement and periventricular white matter low attenuation, nonspecific but may reflect chronic small vessel ischemic disease.   Electronically Signed   By: Sebastian Ache   On: 10/19/2014 17:59    MEDICATIONS                                                                                                                        Scheduled: . allopurinol  100 mg Per Tube BID  . amantadine  100 mg Per Tube BID  . amiodarone  200 mg Per Tube Daily  . antiseptic oral rinse  7 mL Mouth Rinse q12n4p  . aspirin  325 mg Per Tube Daily  . atorvastatin  80 mg Per Tube q1800  . chlorhexidine  15 mL Mouth Rinse BID  . diltiazem  30 mg Oral 4 times per day  . enoxaparin (LOVENOX) injection  40 mg Subcutaneous Q24H  . feeding supplement (VITAL AF 1.2 CAL)  280 mL Per Tube QID  . free water  300 mL Per Tube Q4H  . insulin aspart  0-15 Units Subcutaneous 6 times per day  . latanoprost  1 drop Right Eye QHS  . metoCLOPramide (REGLAN) injection  5 mg Intravenous 3 times per day  . metoprolol tartrate  100 mg Per Tube BID  . pantoprazole sodium  40 mg Per Tube Daily  .  sodium chloride  3 mL Intravenous Q12H    ASSESSMENT/PLAN:                                                                                                           80 y/o with recent non-dominant IVH secondary to coumadin associated coagulopathy in setting of uncontrolled hypertension, re admitted with an encephalopathy of unclear etiology (although has UTI on antimicrobials). Current EEG showing continued generalized slowing but resolution of triphasic waves.   Patient appears alert, tracking, making eye contact. Mental status appears to be slowly improving. Will continue to monitor and  hold off on LP at this time.  Patient has outpatient neurology follow up with Dr Patrcia DollyKaren Aquino [facility] on January 12 at 930am. Per [facility] please have patients family call prior to appointment to update insurance.      Elspeth Choeter Joretta Eads, DO [facility] [phone]  If 7pm- 7am, please page neurology on call as listed in AMION.   10/21/2014, 7:08 AM

## 2014-10-21 NOTE — Discharge Summary (Addendum)
Physician Discharge Summary  Kazuko Clemence ZOX:[id number] DOB: 18-Dec-1927 DOA: 10/08/2014  PCP: Pcp Not In System  Admit date: 10/08/2014 Discharge date: 10/21/14 Recommendations for Outpatient Follow-up:  1. Pt will need to follow up with PCP in 2 weeks post discharge 2. Please obtain BMP and CBC in one week 3. Follow up neurology, Dr. Aquino--12/02/13@0930    Discharge Diagnoses:  Acute encephalopathy -In the setting of recent IVH s/p drain--October 2015 --admitted with recent CVA with IVH, hydrocephalous and h/o temporary drain with fluctuating mental status then, went to SNF 11/10. -She had multiple EEGs including LTM done showing general slowing and frequent intermittent triphasic waves. She did have generalized tremors during hospital stay with no EEG correlate --MRI brain without acute changes -was on Cefepime for UTI, FU Urine Cx- with Klebsiella, resistant to Cephalosporins, hence changed to Meropenem 11/21 -unfortunately continues to be encephalopathic with minimal improvement -ammonia--14 -check RPR, B12, folate, am cortisol--pending -TSH 2.060 -10/16/2014--very little improvement. Patient opens eyes but does not follow commands  -palliative medicine was consulted -10/17/14--as the patient's family continued to struggle with the patient's decline--patient's family has requested to speak with the neurologist -10/17/2014--Reconsulted neurology--case was discussed with Dr. Camilo-->started amantidine 10/17/14 evening -10/20/2014 EEG performed--generalized slowing but resolution of triphasic waves -10/21/2014--pt intermittenly answered questions with one word answers, overall was more alert -10/21/2014--case was discussed with neurology who spoke with the patient's family--the patient was cleared for discharge medically -The patient will continue on amantadine 100 mg twice a day -She has a follow-up appointment with neurology in the outpatient setting, Dr. Aquino-12/02/2013  at 9:30 AM  Gastroparesis -Patient had significant to feed residuals on 10/18/2014 -May be due to anticholinergic side effects of amantadine -Started Reglan-->improved--patient will be discharged with metoclopramide 5 mg every 8 hours -10/18/2014 2 view abdominal x-ray negative for ileus or obstruction AKI -resolved -Baseline creatinine 0.9-1.2 Dysphagia -in setting of recent IVH  -continue enteral feeds--tolerating well -The patient will resume bolus feeds, vital 1.2 280 mL 4 times daily with free water 300 mL 4 times daily Atrial fibrillation -not an AC candidate -continue metoprolol tartrate and diltiazem -Continue amiodarone UTI -Klebsiella pneumoniae -continue Merrem (started 11/21) -Patient finished 7 days of intravenous antibiotic during her hospitalization Diarrhea -C.diff neg -likely due to enteral feeds Hypertension -BP has been labile but acceptable presently -Continue metoprolol tartrate and diltiazem  Discharge Condition: stable  Disposition:      Follow-up Information    Follow up with Van Clines, MD.   Specialty:  Neurology   Why:  12/02/13@0930    Contact information:   [address] [place] [address] [phone]     SNF  Diet:npo--enteral feedings only Wt Readings from Last 3 Encounters:  10/21/14 65.5 kg (144 lb 6.4 oz)  09/15/14 66.3 kg (146 lb 2.6 oz)  08/31/14 63.504 kg (140 lb)    History of present illness:  80 y.o. female with history of diabetes mellitus, chronic kidney disease, hypertension, peripheral vascular disease, recent CVA with intraventricular hemorrhage status post drain placement and removal. The patient was brought in from her nursing facility by her daughter due to increasing confusion. She was discharged to the skilled nursing facility on 09/30/2014. The patient had been able to tolerate oral feeding as well as enteral feeding. For 24-48 hours prior to admission, the patient had been much less  responsive to verbal stimuli. Neurology was consulted. MRI of the brain was obtained and did not reveal any acute findings. Chest x-ray was negative. The patient was  found to have pyuria and was diagnosed with a urinary tract infection with Klebsiella. Her antibiotics were adjusted, and the patient was started on meropenem which she finished 7 days during the hospitalization. In addition, the patient's acute kidney injury also improved. Unfortunately, the patient's mental status only improved minimally. As result, palliative medicine was consulted. The family struggled to make a decision with regard to transitioning to comfort care as the patient was independent as recent as 6 weeks ago. Neurology was reconsulted. Workup for reversible causes of delirium were unremarkable. Repeat EEGs including long-term monitoring did not reveal any epileptiform activity. She was started on amantadine on 10/17/2014 evening. Unfortunately, the patient's mental status did not significantly improve. Repeat EEG was performed on 10/20/2014.   Consultants: Palliative medicine  Discharge Exam: Filed Vitals:   10/21/14 0942  BP: 147/73  Pulse: 116  Temp: 98.9 F (37.2 C)  Resp: 18   Filed Vitals:   10/20/14 0926 10/20/14 2037 10/21/14 0545 10/21/14 0942  BP: 123/65 95/56 126/69 147/73  Pulse: 66 77 98 116  Temp: 98.9 F (37.2 C) 98.2 F (36.8 C) 98.1 F (36.7 C) 98.9 F (37.2 C)  TempSrc: Oral Oral Oral Oral  Resp: 17 18 16 18   Height:      Weight:    65.5 kg (144 lb 6.4 oz)  SpO2: 99% 97% 100% 99%   General: opens eyes to voice, NAD, pleasant, cooperative Cardiovascular: IRRR, no rub, no gallop, no S3 Respiratory: CTAB, no wheeze, no rhonchi Abdomen:soft, nontender, nondistended, positive bowel sounds Extremities: No edema, No lymphangitis, no petechiae  Discharge Instructions     Medication List    STOP taking these medications        ciprofloxacin 250 MG tablet  Commonly known as:  CIPRO       metFORMIN 500 MG tablet  Commonly known as:  GLUCOPHAGE     potassium chloride 20 MEQ/15ML (10%) Soln     QUEtiapine 12.5 mg Tabs tablet  Commonly known as:  SEROQUEL      TAKE these medications        acetaminophen 500 MG tablet  Commonly known as:  TYLENOL  Take 1,000 mg by mouth 3 (three) times daily as needed (pain).     allopurinol 100 MG tablet  Commonly known as:  ZYLOPRIM  Take 100 mg by mouth 2 (two) times daily.     amantadine 50 MG/5ML solution  Commonly known as:  SYMMETREL  Place 10 mLs (100 mg total) into feeding tube 2 (two) times daily.     amiodarone 200 MG tablet  Commonly known as:  PACERONE  Place 1 tablet (200 mg total) into feeding tube daily.     aspirin 325 MG tablet  Place 1 tablet (325 mg total) into feeding tube daily.     atorvastatin 80 MG tablet  Commonly known as:  LIPITOR  Take 80 mg by mouth daily.     cloNIDine 0.1 MG tablet  Commonly known as:  CATAPRES  Place 1 tablet (0.1 mg total) into feeding tube 2 (two) times daily.     diltiazem 30 MG tablet  Commonly known as:  CARDIZEM  Take 1 tablet (30 mg total) by mouth every 6 (six) hours.     feeding supplement (VITAL AF 1.2 CAL) Liqd  Place 270 mLs into feeding tube 4 (four) times daily - after meals and at bedtime.     feeding supplement (ENSURE COMPLETE) Liqd  Take 237 mLs by mouth 3 (three)  times daily with meals.     feeding supplement (VITAL AF 1.2 CAL) Liqd  Place 280 mLs into feeding tube 4 (four) times daily.     ferrous sulfate 325 (65 FE) MG tablet  Take 325 mg by mouth daily with breakfast.     free water Soln  Place 300 mLs into feeding tube every 4 (four) hours.     furosemide 20 MG tablet  Commonly known as:  LASIX  Take 20 mg by mouth daily.     metoCLOPramide 5 MG tablet  Commonly known as:  REGLAN  Take 1 tablet (5 mg total) by mouth 4 (four) times daily.     metoprolol tartrate 25 mg/10 mL Susp  Commonly known as:  LOPRESSOR  Place 40 mLs (100  mg total) into feeding tube 2 (two) times daily.     pantoprazole sodium 40 mg/20 mL Pack  Commonly known as:  PROTONIX  Place 20 mLs (40 mg total) into feeding tube daily.     Travoprost (BAK Free) 0.004 % Soln ophthalmic solution  Commonly known as:  TRAVATAN  Place 1 drop into the right eye at bedtime.     VITAMIN B-12 PO  Take 1 tablet by mouth daily.     Vitamin D3 2000 UNITS Tabs  Take 1 tablet by mouth daily.         The results of significant diagnostics from this hospitalization (including imaging, microbiology, ancillary and laboratory) are listed below for reference.    Significant Diagnostic Studies: Ct Abdomen Wo Contrast  09/23/2014   CLINICAL DATA:  Malnutrition.  Anatomy for PEG tube placement.  EXAM: CT ABDOMEN WITHOUT CONTRAST  TECHNIQUE: Multidetector CT imaging of the abdomen was performed following the standard protocol without IV contrast.  COMPARISON:  None.  FINDINGS: LOWER CHEST: Extensive coronary atherosclerosis. Partially visualized aortic valve sclerosis/calcification.  Small and layering bilateral pleural effusions with passive atelectasis.  ABDOMEN/PELVIS:  Liver: No focal abnormality.  Biliary: Sludge or other layering high density within the gallbladder. No distention.  Pancreas: Unremarkable.  Spleen: Unremarkable.  Adrenals: Unremarkable.  Kidneys and ureters: No hydronephrosis.  Bowel: Feeding tube tip terminates in the proximal stomach. The non insufflated stomach minimally overlaps with the left lobe of the liver. The transverse colon is well inferior to the stomach. There is no evidence of gastric mass or displacement. No abdominal wall hernia or other impediment. No bowel obstruction.  Retroperitoneum: No mass or adenopathy.  Peritoneum: No ascites or pneumoperitoneum.  Vascular: Extensive atherosclerotic calcification.  OSSEOUS: No acute abnormalities.  IMPRESSION: 1. Normal gastric morphology and position. Surrounding anatomy described above. 2. Small  pleural effusions with passive atelectasis.   Electronically Signed   By: Tiburcio PeaJonathan  Watts M.D.   On: 09/23/2014 10:05   Dg Chest 2 View  09/28/2014   CLINICAL DATA:  Fever, history of hypertension  EXAM: CHEST  2 VIEW  COMPARISON:  09/17/2014  FINDINGS: Evidence of CABG. Lungs are hypoaerated with crowding of the bronchovascular markings. Mild cardiomegaly reidentified with central vascular congestion but no overt edema. No new focal pulmonary opacity. Trace pleural effusions are noted.  IMPRESSION: Mild cardiomegaly with central vascular congestion and trace pleural fluid.   Electronically Signed   By: Christiana PellantGretchen  Green M.D.   On: 09/28/2014 15:45   Ct Head Wo Contrast  10/19/2014   CLINICAL DATA:  Encephalopathy.  Altered mental status.  EXAM: CT HEAD WITHOUT CONTRAST  TECHNIQUE: Contiguous axial images were obtained from the base of the skull through  the vertex without intravenous contrast.  COMPARISON:  Brain MRI 10/18/2014.  Head CT 10/08/2014.  FINDINGS: Right frontal burr hole and old catheter tract in the right frontal lobe are noted. Moderate ventricular dilatation does not appear significantly changed. 3 mm right parietal cortical hyperdensity is unchanged over multiple prior studies, favored to represent calcification rather than acute blood products. There is no definite evidence of acute intracranial hemorrhage. There is no evidence of acute large territory infarct, mass, midline shift, or extra-axial fluid collection. Periventricular white-matter hypodensities are unchanged and nonspecific but may reflect moderate chronic small vessel ischemic disease.  Prior bilateral cataract extraction is noted. Mastoid air cells are clear. There is moderate left sphenoid sinus mucosal thickening, mildly increased from prior CT. Sphenoid sinus wall thickening is suggestive of chronic sinusitis. Hyperostosis frontalis is noted.  IMPRESSION: 1. No acute intracranial abnormality identified. 2. Unchanged  ventricular enlargement and periventricular white matter low attenuation, nonspecific but may reflect chronic small vessel ischemic disease.   Electronically Signed   By: Sebastian Ache   On: 10/19/2014 17:59   Ct Head Wo Contrast  10/08/2014   CLINICAL DATA:  Increasing weakness and lethargy since Saturday, recent UTIs, personal history of diabetes, renal disorder, hypertension  EXAM: CT HEAD WITHOUT CONTRAST  TECHNIQUE: Contiguous axial images were obtained from the base of the skull through the vertex without intravenous contrast.  COMPARISON:  09/26/2014  FINDINGS: Minimal atrophy.  Mild prominence of the ventricular system little changed.  Small vessel chronic ischemic changes of deep cerebral white matter.  Intraventricular blood identified at the occipital horns of the lateral ventricles on the previous exam no longer identified.  Focus of high attenuation at the RIGHT parietal lobe likely tiny calcification, unchanged since 09/04/2014 making this unlikely to represent blood.  No definite high attenuation acute intracranial hemorrhage, mass lesion or evidence acute infarction.  No extra-axial fluid collections.  Atherosclerotic calcifications at the carotid siphons.  Scattered motion artifacts.  Prior burr hole RIGHT frontal.  Hyperostosis frontalis interna.  No acute osseous findings.  IMPRESSION: Motion artifacts.  Atrophy with mild persistent prominence of the ventricular system and small vessel chronic ischemic changes.  Resolution of minimal intraventricular blood seen on the previous exam.   Electronically Signed   By: Ulyses Southward M.D.   On: 10/08/2014 22:13   Ct Head Wo Contrast  09/26/2014   CLINICAL DATA:  Progressive cognitive decline over the last 2 days.  EXAM: CT HEAD WITHOUT CONTRAST  TECHNIQUE: Contiguous axial images were obtained from the base of the skull through the vertex without intravenous contrast.  COMPARISON:  CT head without contrast 09/14/2014.  FINDINGS: Minimal blood products  are still seen dependently within the posterior horns of the lateral ventricles. There is no new hemorrhage. Ventricular enlargement is similar to the prior study although the temporal horns may be slightly more prominent. No acute cortical infarct, hemorrhage, or other mass lesion is present. Mild mucosal thickening is present in the sphenoid sinuses bilaterally. The remaining paranasal sinuses and the mastoid air cells are clear.  IMPRESSION: 1. Near complete resolution of layering blood products in the posterior horn of the lateral ventricles bilaterally. 2. Stable ventricular enlargement. 3. No new hemorrhage. 4. Stable atrophy and moderate white matter disease.   Electronically Signed   By: Gennette Pac M.D.   On: 09/26/2014 18:20   Mr Brain Wo Contrast  10/10/2014   CLINICAL DATA:  Encephalopathy. Intraventricular hemorrhage and drain placement last month.  EXAM: MRI HEAD WITHOUT  CONTRAST  TECHNIQUE: Multiplanar, multiecho pulse sequences of the brain and surrounding structures were obtained without intravenous contrast.  COMPARISON:  Head CT 09/28/2014 and MRI 09/12/2014  FINDINGS: Images are mildly to moderately degraded by motion artifact.  No acute infarct is identified. There is a small amount of diffusion weighted signal abnormality along the prior right frontal ventriculostomy catheter tract likely secondary to prior intervention. No sizable intracranial hemorrhage is identified, although T2* images are particularly motion degraded. There may be minimal residual chronic blood products in the occipital horns.  The lateral and third ventricles remain moderately dilated but overall slightly improved compared to the prior MRI. Mild T2 hyperintensity in the periventricular white matter bilaterally does not appear significantly changed, nonspecific. No mass, midline shift, or extra-axial fluid collection is identified.  Prior bilateral cataract extraction is noted. There is mild right greater the left  sphenoid sinus mucosal thickening. Mastoid air cells are clear. Major intracranial vascular flow voids are preserved.  IMPRESSION: 1. No evidence acute/new intracranial abnormality. 2. Slightly decreased ventricular dilatation since 09/12/2014 MRI.   Electronically Signed   By: Sebastian AcheAllen  Grady   On: 10/10/2014 12:22   Ir Gastrostomy Tube Mod Sed  09/23/2014   INDICATION: Patient admitted with intraventricular/subarachnoid hemorrhage with persistent altered mental status and now with dysphagia. Request made for percutaneous gastrostomy tube placement for enteric nutrition.  EXAM: PULL TROUGH GASTROSTOMY TUBE PLACEMENT  COMPARISON:  CT abdomen and pelvis - 09/23/2014  MEDICATIONS: The patient is currently admitted to the hospital receiving intravenous antibiotics. The antibiotics were administered within an appropriate time frame prior to initiation of the procedure.  CONTRAST:  15 mL of Isovue 300 administered into the gastric lumen.  ANESTHESIA/SEDATION: Versed 1 mg IV; Fentanyl 50 mcg IV  Sedation time  25 minutes  FLUOROSCOPY TIME:  4 minutes.  COMPLICATIONS: None immediate  PROCEDURE: Informed written consent was obtained from the patient's daughter following explanation of the procedure, risks, benefits and alternatives. A time out was performed prior to the initiation of the procedure. Ultrasound scanning was performed to demarcate the edge of the left lobe of the liver. Maximal barrier sterile technique utilized including caps, mask, sterile gowns, sterile gloves, large sterile drape, hand hygiene and Betadine prep.  The left upper quadrant was sterilely prepped and draped. An oral gastric catheter was inserted into the stomach under fluoroscopy. The existing nasogastric feeding tube was removed. The left costal margin and air opacified transverse colon were identified and avoided. Air was injected into the stomach for insufflation and visualization under fluoroscopy. Under sterile conditions a 17 gauge trocar  needle was utilized to access the stomach percutaneously beneath the left subcostal margin after the overlying soft tissues were anesthetized with 1% Lidocaine with epinephrine. Needle position was confirmed within the stomach with aspiration of air and injection of small amount of contrast. A single T tack was deployed for gastropexy. Over an Amplatz guide wire, a 9-French sheath was inserted into the stomach. A snare device was utilized to capture the oral gastric catheter. The snare device was pulled retrograde from the stomach up the esophagus and out the oropharynx. The 20-French pull-through gastrostomy was connected to the snare device and pulled antegrade through the oropharynx down the esophagus into the stomach and then through the percutaneous tract external to the patient. The gastrostomy was assembled externally. Contrast injection confirms position in the stomach. Several spot radiographic images were obtained in various obliquities for documentation. The patient tolerated procedure well without immediate post procedural complication.  FINDINGS: After successful fluoroscopic guided placement, the gastrostomy tube is appropriately positioned with internal disc against the ventral aspect of the gastric lumen.  IMPRESSION: Successful fluoroscopic insertion of a 20-French pull-through gastrostomy tube.  The gastrostomy may be used immediately for medication administration and in 24 hrs for the initiation of feeds.   Electronically Signed   By: Simonne Come M.D.   On: 09/23/2014 14:55   Dg Chest Port 1 View  10/08/2014   CLINICAL DATA:  Initial evaluation for weakness and lethargy for 3 days with altered mental status  EXAM: PORTABLE CHEST - 1 VIEW  COMPARISON:  09/30/2014  FINDINGS: The heart size and mediastinal contours are within normal limits. Both lungs are clear. The visualized skeletal structures are unremarkable.  IMPRESSION: No active disease.   Electronically Signed   By: Esperanza Heir M.D.    On: 10/08/2014 21:32   Dg Chest Port 1 View  09/30/2014   CLINICAL DATA:  Cough.  EXAM: PORTABLE CHEST - 1 VIEW  COMPARISON:  09/28/2014.  FINDINGS: Mediastinum hilar structures are normal. Prior CABG. Stable cardiomegaly. Interval improvement of pulmonary venous congestion. Mild bibasilar subsegmental atelectasis. Small left pleural effusion cannot be excluded. No pneumothorax. Degenerative changes thoracic spine and both shoulders.  IMPRESSION: 1. Prior CABG. Stable cardiomegaly. Interim resolution of pulmonary venous congestion. 2. Mild bibasilar atelectasis. Small left pleural effusion cannot be excluded.   Electronically Signed   By: Maisie Fus  Register   On: 09/30/2014 07:23   Dg Abd 2 Views  10/18/2014   CLINICAL DATA:  Increased abdominal distention. Unable to communicate.  EXAM: ABDOMEN - 2 VIEW  COMPARISON:  09/20/2014 and abdomen and pelvis CT dated 09/23/2014.  FINDINGS: Gastrostomy tube in the expected position of the proximal to mid stomach. Normal bowel gas pattern without free peritoneal air. Lumbar and lower thoracic spine degenerative changes. Post CABG changes. Atheromatous arterial calcifications.  IMPRESSION: No acute abnormality.   Electronically Signed   By: Gordan Payment M.D.   On: 10/18/2014 13:28     Microbiology: Recent Results (from the past 240 hour(s))  Clostridium Difficile by PCR     Status: None   Collection Time: 10/14/14  8:20 PM  Result Value Ref Range Status   C difficile by pcr NEGATIVE NEGATIVE Final     Labs: Basic Metabolic Panel:  Recent Labs Lab 10/16/14 0504 10/18/14 0937  NA 139 138  K 4.3 4.5  CL 102 100  CO2 22 24  GLUCOSE 131* 93  BUN 35* 34*  CREATININE 0.84 0.79  CALCIUM 8.8 9.0   Liver Function Tests:  Recent Labs Lab 10/16/14 0504 10/18/14 0937  AST 29 24  ALT 35 27  ALKPHOS 93 86  BILITOT <0.2* 0.2*  PROT 6.9 6.7  ALBUMIN 2.3* 2.3*   No results for input(s): LIPASE, AMYLASE in the last 168 hours. No results for  input(s): AMMONIA in the last 168 hours. CBC:  Recent Labs Lab 10/16/14 0504 10/18/14 0937  WBC 11.2* 10.6*  HGB 10.3* 10.1*  HCT 32.9* 31.5*  MCV 96.5 94.9  PLT 241 218   Cardiac Enzymes: No results for input(s): CKTOTAL, CKMB, CKMBINDEX, TROPONINI in the last 168 hours. BNP: Invalid input(s): POCBNP CBG:  Recent Labs Lab 10/20/14 1706 10/20/14 2033 10/20/14 2358 10/21/14 0616 10/21/14 0754  GLUCAP 91 146* 142* 100* 101*    Time coordinating discharge:  Greater than 30 minutes  Signed:  Viki Carrera, DO [facility] Pager: [phone] 10/21/2014, 10:35 AM

## 2014-10-21 NOTE — Evaluation (Signed)
Physical Therapy Evaluation Patient Details Name: Jamesetta SoGiuseppina Goldbach MRN: [id number] DOB: 09/09/1928 Today's Date: 10/21/2014   History of Present Illness  80 y.o. female with Past medical history of diabetes mellitus, chronic kidney disease, hypertension, peripheral vascular disease, recent CVA with intraventricular hemorrhage status post drain placement and removal. She has a PEG from previous admission. Pt. found to have UTI. CXR No active disease.  Clinical Impression  Pt admitted with the above. Pt currently with functional limitations due to the deficits listed below (see PT Problem List). At the time of PT eval pt was able to sit EOB ~10 minutes where goal was sitting balance. Pt demonstrated decreased LUE posturing. Appeared fearful of falling forward at times. When daughter was speaking to pt would "grunt" a response, however would not answer any of PT's questions (asked yes or no questions). Pt will benefit from skilled PT to increase their independence and safety with mobility to allow discharge to the venue listed below.      Follow Up Recommendations SNF;Supervision/Assistance - 24 hour    Equipment Recommendations  None recommended by PT    Recommendations for Other Services       Precautions / Restrictions Precautions Precautions: Fall Restrictions Weight Bearing Restrictions: No      Mobility  Bed Mobility Overal bed mobility: Needs Assistance;+2 for physical assistance Bed Mobility: Sidelying to Sit;Sit to Supine   Sidelying to sit: Total assist;+2 for physical assistance   Sit to supine: Total assist;+2 for physical assistance   General bed mobility comments: Pt very lethargic. No attempt to assist during bed mobility. Did not follow commands for hand placement or LE movement. Bed pad used for scooting.   Transfers                 General transfer comment: Did not attempt due to low level of arousal.   Ambulation/Gait                Stairs            Wheelchair Mobility    Modified Rankin (Stroke Patients Only)       Balance Overall balance assessment: Needs assistance Sitting-balance support: Feet supported;Bilateral upper extremity supported Sitting balance-Leahy Scale: Poor Sitting balance - Comments: Pt with ability to sit self-supported for short amounts of time. Occasionally pt was able to self-correct but as pt fatigued, increased assist was required to right trunk to upright posture. Pt was able to sit with arms outstretched for ~10 minutes.  Postural control: Posterior lean                                   Pertinent Vitals/Pain Pain Assessment: Faces Faces Pain Scale: No hurt    Home Living Family/patient expects to be discharged to:: Skilled nursing facility                      Prior Function                 Hand Dominance        Extremity/Trunk Assessment   Upper Extremity Assessment: Defer to OT evaluation           Lower Extremity Assessment: Generalized weakness      Cervical / Trunk Assessment: Other exceptions  Communication   Communication: Expressive difficulties  Cognition Arousal/Alertness: Lethargic Behavior During Therapy: Flat affect Overall Cognitive Status: Difficult to assess Area of Impairment:  Attention;Following commands;Awareness   Current Attention Level: Focused   Following Commands: Follows one step commands inconsistently;Follows one step commands with increased time   Awareness: Intellectual        General Comments      Exercises        Assessment/Plan    PT Assessment Patient needs continued PT services  PT Diagnosis Altered mental status   PT Problem List Decreased strength;Decreased range of motion;Decreased activity tolerance;Decreased balance;Decreased mobility;Decreased coordination;Decreased cognition  PT Treatment Interventions DME instruction;Gait training;Stair training;Functional mobility  training;Therapeutic activities;Therapeutic exercise;Balance training;Cognitive remediation;Patient/family education   PT Goals (Current goals can be found in the Care Plan section) Acute Rehab PT Goals Patient Stated Goal: unable to state PT Goal Formulation: Patient unable to participate in goal setting Time For Goal Achievement: 10/28/14 Potential to Achieve Goals: Fair    Frequency Min 2X/week   Barriers to discharge        Co-evaluation               End of Session   Activity Tolerance: Patient limited by lethargy Patient left: in bed;with bed alarm set;with call bell/phone within reach;with family/visitor present Nurse Communication: Mobility status         Time: [id number] PT Time Calculation (min) (ACUTE ONLY): 20 min   Charges:   PT Evaluation $Initial PT Evaluation Tier I: 1 Procedure PT Treatments $Therapeutic Activity: 8-22 mins   PT G Codes:          Conni SlipperKirkman, Freddi Schrager 10/21/2014, 5:29 PM  Conni SlipperLaura Arvind Mexicano, PT, DPT Acute Rehabilitation Services Pager: [phone]

## 2014-10-21 NOTE — Progress Notes (Addendum)
Patient responding to simple yes or no questions. Patient can say yes or no and can follow commands. Susan Calhoun notified. Will continue to monitor. [name], [name]

## 2014-10-21 NOTE — Clinical Social Work Note (Signed)
[facility] to continue her rehabilitation. Discharge information transmitted to facility and EMS called to transport patient back to facility. Daughter at the bedside and is aware of discharge, and was informed that transport had been called.  Genelle BalVanessa Annalisa Colonna, MSW, LCSW [phone]

## 2014-10-21 NOTE — Progress Notes (Signed)
Pt prepared for d/c to SNF. IV d/c'd. Skin intact except as most recently charted. Vitals are stable. Report called to Antionette at [facility] (receiving facility). Pt to be transported by ambulance service.  Peri MarisAndrew Jamya Starry, MBA, BS, RN

## 2014-11-24 ENCOUNTER — Encounter (HOSPITAL_COMMUNITY): Payer: Self-pay | Admitting: Emergency Medicine

## 2014-11-24 ENCOUNTER — Inpatient Hospital Stay (HOSPITAL_COMMUNITY)
Admission: EM | Admit: 2014-11-24 | Discharge: 2014-12-01 | DRG: 689 | Disposition: A | Discharge status: Skilled Nursing Facility | Payer: PPO | Attending: Internal Medicine | Admitting: Internal Medicine

## 2014-11-24 ENCOUNTER — Emergency Department (HOSPITAL_COMMUNITY): Payer: PPO

## 2014-11-24 DIAGNOSIS — I472 Ventricular tachycardia: Secondary | ICD-10-CM | POA: Diagnosis present

## 2014-11-24 DIAGNOSIS — Z931 Gastrostomy status: Secondary | ICD-10-CM

## 2014-11-24 DIAGNOSIS — Z79899 Other long term (current) drug therapy: Secondary | ICD-10-CM

## 2014-11-24 DIAGNOSIS — Z7189 Other specified counseling: Secondary | ICD-10-CM

## 2014-11-24 DIAGNOSIS — I739 Peripheral vascular disease, unspecified: Secondary | ICD-10-CM | POA: Diagnosis present

## 2014-11-24 DIAGNOSIS — Z8673 Personal history of transient ischemic attack (TIA), and cerebral infarction without residual deficits: Secondary | ICD-10-CM | POA: Diagnosis not present

## 2014-11-24 DIAGNOSIS — Z96651 Presence of right artificial knee joint: Secondary | ICD-10-CM | POA: Diagnosis present

## 2014-11-24 DIAGNOSIS — K9423 Gastrostomy malfunction: Secondary | ICD-10-CM | POA: Diagnosis not present

## 2014-11-24 DIAGNOSIS — E1142 Type 2 diabetes mellitus with diabetic polyneuropathy: Secondary | ICD-10-CM | POA: Diagnosis present

## 2014-11-24 DIAGNOSIS — G9340 Encephalopathy, unspecified: Secondary | ICD-10-CM | POA: Diagnosis present

## 2014-11-24 DIAGNOSIS — R1310 Dysphagia, unspecified: Secondary | ICD-10-CM | POA: Diagnosis present

## 2014-11-24 DIAGNOSIS — Z66 Do not resuscitate: Secondary | ICD-10-CM | POA: Diagnosis present

## 2014-11-24 DIAGNOSIS — F29 Unspecified psychosis not due to a substance or known physiological condition: Secondary | ICD-10-CM | POA: Diagnosis present

## 2014-11-24 DIAGNOSIS — I5032 Chronic diastolic (congestive) heart failure: Secondary | ICD-10-CM | POA: Diagnosis present

## 2014-11-24 DIAGNOSIS — I340 Nonrheumatic mitral (valve) insufficiency: Secondary | ICD-10-CM | POA: Diagnosis present

## 2014-11-24 DIAGNOSIS — Z882 Allergy status to sulfonamides status: Secondary | ICD-10-CM

## 2014-11-24 DIAGNOSIS — I2510 Atherosclerotic heart disease of native coronary artery without angina pectoris: Secondary | ICD-10-CM | POA: Diagnosis present

## 2014-11-24 DIAGNOSIS — N390 Urinary tract infection, site not specified: Secondary | ICD-10-CM | POA: Diagnosis present

## 2014-11-24 DIAGNOSIS — H409 Unspecified glaucoma: Secondary | ICD-10-CM | POA: Diagnosis present

## 2014-11-24 DIAGNOSIS — N289 Disorder of kidney and ureter, unspecified: Secondary | ICD-10-CM | POA: Diagnosis present

## 2014-11-24 DIAGNOSIS — I4891 Unspecified atrial fibrillation: Secondary | ICD-10-CM | POA: Diagnosis present

## 2014-11-24 DIAGNOSIS — I481 Persistent atrial fibrillation: Secondary | ICD-10-CM | POA: Diagnosis present

## 2014-11-24 DIAGNOSIS — F0390 Unspecified dementia without behavioral disturbance: Secondary | ICD-10-CM | POA: Diagnosis present

## 2014-11-24 DIAGNOSIS — R7989 Other specified abnormal findings of blood chemistry: Secondary | ICD-10-CM | POA: Diagnosis not present

## 2014-11-24 DIAGNOSIS — F0391 Unspecified dementia with behavioral disturbance: Secondary | ICD-10-CM | POA: Diagnosis not present

## 2014-11-24 DIAGNOSIS — I10 Essential (primary) hypertension: Secondary | ICD-10-CM | POA: Diagnosis present

## 2014-11-24 DIAGNOSIS — Z7982 Long term (current) use of aspirin: Secondary | ICD-10-CM | POA: Diagnosis not present

## 2014-11-24 DIAGNOSIS — N3000 Acute cystitis without hematuria: Secondary | ICD-10-CM

## 2014-11-24 DIAGNOSIS — Z951 Presence of aortocoronary bypass graft: Secondary | ICD-10-CM

## 2014-11-24 DIAGNOSIS — R945 Abnormal results of liver function studies: Secondary | ICD-10-CM

## 2014-11-24 DIAGNOSIS — R001 Bradycardia, unspecified: Secondary | ICD-10-CM

## 2014-11-24 DIAGNOSIS — N179 Acute kidney failure, unspecified: Secondary | ICD-10-CM | POA: Diagnosis present

## 2014-11-24 DIAGNOSIS — I639 Cerebral infarction, unspecified: Secondary | ICD-10-CM

## 2014-11-24 DIAGNOSIS — E860 Dehydration: Secondary | ICD-10-CM | POA: Diagnosis present

## 2014-11-24 DIAGNOSIS — K9420 Gastrostomy complication, unspecified: Secondary | ICD-10-CM

## 2014-11-24 DIAGNOSIS — R748 Abnormal levels of other serum enzymes: Secondary | ICD-10-CM

## 2014-11-24 DIAGNOSIS — R4182 Altered mental status, unspecified: Secondary | ICD-10-CM

## 2014-11-24 LAB — CBC WITH DIFFERENTIAL/PLATELET
BASOS PCT: 1 % (ref 0–1)
Basophils Absolute: 0.1 10*3/uL (ref 0.0–0.1)
EOS PCT: 2 % (ref 0–5)
Eosinophils Absolute: 0.2 10*3/uL (ref 0.0–0.7)
HEMATOCRIT: 34 % — AB (ref 36.0–46.0)
Hemoglobin: 10.9 g/dL — ABNORMAL LOW (ref 12.0–15.0)
Lymphocytes Relative: 29 % (ref 12–46)
Lymphs Abs: 2.7 10*3/uL (ref 0.7–4.0)
MCH: 30.4 pg (ref 26.0–34.0)
MCHC: 32.1 g/dL (ref 30.0–36.0)
MCV: 94.7 fL (ref 78.0–100.0)
MONO ABS: 0.8 10*3/uL (ref 0.1–1.0)
Monocytes Relative: 9 % (ref 3–12)
NEUTROS ABS: 5.4 10*3/uL (ref 1.7–7.7)
Neutrophils Relative %: 59 % (ref 43–77)
Platelets: 309 10*3/uL (ref 150–400)
RBC: 3.59 MIL/uL — ABNORMAL LOW (ref 3.87–5.11)
RDW: 16.2 % — ABNORMAL HIGH (ref 11.5–15.5)
WBC: 9.1 10*3/uL (ref 4.0–10.5)

## 2014-11-24 LAB — URINALYSIS, ROUTINE W REFLEX MICROSCOPIC
BILIRUBIN URINE: NEGATIVE
Glucose, UA: NEGATIVE mg/dL
Hgb urine dipstick: NEGATIVE
KETONES UR: NEGATIVE mg/dL
NITRITE: NEGATIVE
PH: 7.5 (ref 5.0–8.0)
Protein, ur: >300 mg/dL — AB
Specific Gravity, Urine: 1.014 (ref 1.005–1.030)
Urobilinogen, UA: 1 mg/dL (ref 0.0–1.0)

## 2014-11-24 LAB — COMPREHENSIVE METABOLIC PANEL
ALBUMIN: 2.8 g/dL — AB (ref 3.5–5.2)
ALT: 233 U/L — ABNORMAL HIGH (ref 0–35)
AST: 152 U/L — AB (ref 0–37)
Alkaline Phosphatase: 211 U/L — ABNORMAL HIGH (ref 39–117)
Anion gap: 12 (ref 5–15)
BUN: 65 mg/dL — ABNORMAL HIGH (ref 6–23)
CALCIUM: 9.2 mg/dL (ref 8.4–10.5)
CO2: 20 mmol/L (ref 19–32)
Chloride: 108 mEq/L (ref 96–112)
Creatinine, Ser: 1.56 mg/dL — ABNORMAL HIGH (ref 0.50–1.10)
GFR calc Af Amer: 34 mL/min — ABNORMAL LOW (ref >90)
GFR calc non Af Amer: 29 mL/min — ABNORMAL LOW (ref >90)
Glucose, Bld: 144 mg/dL — ABNORMAL HIGH (ref 70–99)
Potassium: 4.3 mmol/L (ref 3.5–5.1)
Sodium: 140 mmol/L (ref 135–145)
Total Bilirubin: 0.5 mg/dL (ref 0.3–1.2)
Total Protein: 6.7 g/dL (ref 6.0–8.3)

## 2014-11-24 LAB — URINE MICROSCOPIC-ADD ON

## 2014-11-24 LAB — I-STAT CG4 LACTIC ACID, ED: LACTIC ACID, VENOUS: 1.18 mmol/L (ref 0.5–2.2)

## 2014-11-24 MED ORDER — SODIUM CHLORIDE 0.9 % IV BOLUS (SEPSIS)
500.0000 mL | Freq: Once | INTRAVENOUS | Status: AC
  Administered 2014-11-24: 500 mL via INTRAVENOUS

## 2014-11-24 MED ORDER — DEXTROSE 5 % IV SOLN
1.0000 g | Freq: Once | INTRAVENOUS | Status: AC
  Administered 2014-11-24: 1 g via INTRAVENOUS
  Filled 2014-11-24: qty 10

## 2014-11-24 NOTE — ED Notes (Addendum)
I Stat Lactic Acid results shown to Dr. R. Lockwood 

## 2014-11-24 NOTE — ED Notes (Signed)
Per ems, pt here for discharge in brief per nursing home. Pt arrives awake, tremorous, non-verbal.  Family at bedside states this is not baseline for patient

## 2014-11-24 NOTE — ED Provider Notes (Signed)
CSN: [id number]     Arrival date & time 11/24/14  2052 History   First MD Initiated Contact with Patient 11/24/14 2058     Chief Complaint  Patient presents with  . Urinary Tract Infection     (Consider location/radiation/quality/duration/timing/severity/associated sxs/prior Treatment) HPI 2 presents from her nursing facility with staff concerns of decreased interactivity, tactile fever. Patient has dementia, level V caveat. Per report, the patient has multiple other medical issues, including recent hemorrhagic stroke, and has had a general = in her interactivity over the past few months. Today, the patient's nursing home staff noticed a change from her baseline minimal interactivity, as well as the aforementioned tactile fever. Patient has previously had similar symptoms, been found to have a urinary tract infection. Patient is DO NOT RESUSCITATE, but was sent here for evaluation.  Past Medical History  Diagnosis Date  . Diabetes mellitus with diabetic polyneuropathy   . A-fib   . Renal disorder   . Hypertension   . Anemia   . Glaucoma     right eye   . Carotid artery stenosis   . PVD (peripheral vascular disease)    Past Surgical History  Procedure Laterality Date  . Replacement total knee Right   . Carotid endarterectomy  2015  . Coronary artery bypass graft    . Ablation      cardiac for arrthymia    History reviewed. No pertinent family history. History  Substance Use Topics  . Smoking status: Never Smoker   . Smokeless tobacco: Not on file  . Alcohol Use: No   OB History    No data available     Review of Systems  Unable to perform ROS: Dementia      Allergies  Septra  Home Medications   Prior to Admission medications   Medication Sig Start Date End Date Taking? Authorizing Provider  acetaminophen (TYLENOL) 500 MG tablet Take 1,000 mg by mouth 3 (three) times daily as needed (pain).    Historical Provider, MD  allopurinol (ZYLOPRIM) 100 MG tablet  Take 100 mg by mouth 2 (two) times daily.     Historical Provider, MD  amantadine (SYMMETREL) 50 MG/5ML solution Place 10 mLs (100 mg total) into feeding tube 2 (two) times daily. 10/21/14   Catarina Hartshorn, MD  amiodarone (PACERONE) 200 MG tablet Place 1 tablet (200 mg total) into feeding tube daily. 09/26/14   Layne Benton, NP  aspirin 325 MG tablet Place 1 tablet (325 mg total) into feeding tube daily. 10/21/14   Catarina Hartshorn, MD  atorvastatin (LIPITOR) 80 MG tablet Take 80 mg by mouth daily.    Historical Provider, MD  Cholecalciferol (VITAMIN D3) 2000 UNITS TABS Take 1 tablet by mouth daily.    Historical Provider, MD  cloNIDine (CATAPRES) 0.1 MG tablet Place 1 tablet (0.1 mg total) into feeding tube 2 (two) times daily. Patient not taking: Reported on 10/08/2014 09/30/14   Layne Benton, NP  Cyanocobalamin (VITAMIN B-12 PO) Take 1 tablet by mouth daily.    Historical Provider, MD  diltiazem (CARDIZEM) 30 MG tablet Take 1 tablet (30 mg total) by mouth every 6 (six) hours. 09/26/14   Layne Benton, NP  feeding supplement, ENSURE COMPLETE, (ENSURE COMPLETE) LIQD Take 237 mLs by mouth 3 (three) times daily with meals. 09/30/14   Marvel Plan, MD  ferrous sulfate 325 (65 FE) MG tablet Take 325 mg by mouth daily with breakfast.    Historical Provider, MD  furosemide (LASIX) 20 MG  tablet Take 20 mg by mouth daily.    Historical Provider, MD  metoCLOPramide (REGLAN) 5 MG tablet Take 1 tablet (5 mg total) by mouth 4 (four) times daily. 10/21/14   Catarina Hartshorn, MD  metoprolol tartrate (LOPRESSOR) 25 mg/10 mL SUSP Place 40 mLs (100 mg total) into feeding tube 2 (two) times daily. 09/30/14   Layne Benton, NP  Nutritional Supplements (FEEDING SUPPLEMENT, VITAL AF 1.2 CAL,) LIQD Place 270 mLs into feeding tube 4 (four) times daily - after meals and at bedtime. Patient not taking: Reported on 10/08/2014 09/26/14   Layne Benton, NP  Nutritional Supplements (FEEDING SUPPLEMENT, VITAL AF 1.2 CAL,) LIQD Place 280 mLs into  feeding tube 4 (four) times daily. 10/21/14   Catarina Hartshorn, MD  pantoprazole sodium (PROTONIX) 40 mg/20 mL PACK Place 20 mLs (40 mg total) into feeding tube daily. 09/26/14   Layne Benton, NP  Travoprost, BAK Free, (TRAVATAN) 0.004 % SOLN ophthalmic solution Place 1 drop into the right eye at bedtime.    Historical Provider, MD  Water For Irrigation, Sterile (FREE WATER) SOLN Place 300 mLs into feeding tube every 4 (four) hours. 09/30/14   Layne Benton, NP   There were no vitals taken for this visit. Physical Exam  Constitutional: She appears listless. She appears ill.  Deconditioned, ill-appearing elderly female, essentially nonverbal  HENT:  Head: Normocephalic and atraumatic.  Eyes: Conjunctivae and EOM are normal.  Cardiovascular: Normal rate and regular rhythm.   Pulmonary/Chest: Effort normal and breath sounds normal. No stridor. No respiratory distress.  Abdominal: She exhibits no distension.  Musculoskeletal: She exhibits no edema.  Neurological: She appears listless. No cranial nerve deficit.  Minimal intimal spontaneous motion, no spontaneous speech, there is mild spontaneous motion in all extremity.  Skin: Skin is warm and dry.  Psychiatric: Cognition and memory are impaired. She is noncommunicative.  Nursing note and vitals reviewed.   ED Course  Procedures (including critical care time) Labs Review Labs Reviewed  COMPREHENSIVE METABOLIC PANEL - Abnormal; Notable for the following:    Glucose, Bld 144 (*)    BUN 65 (*)    Creatinine, Ser 1.56 (*)    Albumin 2.8 (*)    AST 152 (*)    ALT 233 (*)    Alkaline Phosphatase 211 (*)    GFR calc non Af Amer 29 (*)    GFR calc Af Amer 34 (*)    All other components within normal limits  CBC WITH DIFFERENTIAL - Abnormal; Notable for the following:    RBC 3.59 (*)    Hemoglobin 10.9 (*)    HCT 34.0 (*)    RDW 16.2 (*)    All other components within normal limits  URINALYSIS, ROUTINE W REFLEX MICROSCOPIC - Abnormal; Notable  for the following:    APPearance CLOUDY (*)    Protein, ur >300 (*)    Leukocytes, UA LARGE (*)    All other components within normal limits  URINE MICROSCOPIC-ADD ON - Abnormal; Notable for the following:    Bacteria, UA MANY (*)    All other components within normal limits  URINE CULTURE  I-STAT CG4 LACTIC ACID, ED    Imaging Review Dg Chest 1 View  11/24/2014   CLINICAL DATA:  Shortness of breath.  Urinary tract infection.  EXAM: CHEST - 1 VIEW  COMPARISON:  10/08/2014  FINDINGS: Patient is post median sternotomy. The cardiomediastinal contours are unchanged. Heart is at the upper limits of normal in size.  No pulmonary edema. No consolidation to suggest pneumonia. No large pleural effusion. No pneumothorax. Chronic change of both shoulders again seen.  IMPRESSION: No acute pulmonary process.   Electronically Signed   By: Rubye Oaks M.D.   On: 11/24/2014 23:11   Dg Abd 1 View  11/24/2014   CLINICAL DATA:  Complications of feeding tube.  EXAM: ABDOMEN - 1 VIEW  COMPARISON:  10/18/2014  FINDINGS: Gastrostomy tube tip and balloon the are localized to the left upper quadrant, consistent with location in the stomach. Positioning is similar to previous study. Normal bowel gas pattern. Degenerative changes in the spine and hips. Calcifications in the pelvis consistent with phleboliths.  IMPRESSION: Gastrostomy tube projects over the stomach.   Electronically Signed   By: Burman Nieves M.D.   On: 11/24/2014 23:07    10:44 PM Daughter now present. Patient's daughter requests evaluation of feeding tube.  This seems appropriately located, with no abdominal pain on palpation. Daughter also requests evaluation of hematoma/ecchymosis on the upper chest.  There is a visible small ecchymotic area about a prominent bone in the upper sternum, but no deformity beyond this It is located near a scar from cardiac bypass.   11:24 PM Family of all results. With acute kidney injury, patient will be  admitted.  MDM   This patient with multiple medical palms, including dementia presents from her nursing facility with concern of fever, delirium. She has a urinary tract infection, acute kidney injury. Patient received fluid resuscitation, ceftriaxone, was admitted for further evaluation and management.    Gerhard Munch, MD 11/24/14 [phone]

## 2014-11-25 ENCOUNTER — Inpatient Hospital Stay (HOSPITAL_COMMUNITY): Payer: PPO

## 2014-11-25 DIAGNOSIS — R945 Abnormal results of liver function studies: Secondary | ICD-10-CM

## 2014-11-25 DIAGNOSIS — N289 Disorder of kidney and ureter, unspecified: Secondary | ICD-10-CM

## 2014-11-25 DIAGNOSIS — I482 Chronic atrial fibrillation: Secondary | ICD-10-CM

## 2014-11-25 DIAGNOSIS — R7989 Other specified abnormal findings of blood chemistry: Secondary | ICD-10-CM | POA: Diagnosis present

## 2014-11-25 DIAGNOSIS — N179 Acute kidney failure, unspecified: Secondary | ICD-10-CM

## 2014-11-25 DIAGNOSIS — N390 Urinary tract infection, site not specified: Secondary | ICD-10-CM | POA: Diagnosis present

## 2014-11-25 DIAGNOSIS — N3000 Acute cystitis without hematuria: Secondary | ICD-10-CM

## 2014-11-25 DIAGNOSIS — R410 Disorientation, unspecified: Secondary | ICD-10-CM

## 2014-11-25 DIAGNOSIS — I639 Cerebral infarction, unspecified: Secondary | ICD-10-CM

## 2014-11-25 LAB — GLUCOSE, CAPILLARY
GLUCOSE-CAPILLARY: 165 mg/dL — AB (ref 70–99)
GLUCOSE-CAPILLARY: 80 mg/dL (ref 70–99)
Glucose-Capillary: 119 mg/dL — ABNORMAL HIGH (ref 70–99)
Glucose-Capillary: 102 mg/dL — ABNORMAL HIGH (ref 70–99)
Glucose-Capillary: 153 mg/dL — ABNORMAL HIGH (ref 70–99)

## 2014-11-25 LAB — AMMONIA: AMMONIA: 25 umol/L (ref 11–32)

## 2014-11-25 LAB — MRSA PCR SCREENING: MRSA by PCR: NEGATIVE

## 2014-11-25 MED ORDER — ATORVASTATIN CALCIUM 80 MG PO TABS
80.0000 mg | ORAL_TABLET | Freq: Every day | ORAL | Status: DC
  Administered 2014-11-25: 80 mg via ORAL
  Filled 2014-11-25: qty 1

## 2014-11-25 MED ORDER — LEVOFLOXACIN 250 MG PO TABS
250.0000 mg | ORAL_TABLET | Freq: Every day | ORAL | Status: DC
  Administered 2014-11-25 – 2014-12-01 (×7): 250 mg via ORAL
  Filled 2014-11-25 (×7): qty 1

## 2014-11-25 MED ORDER — ALLOPURINOL 100 MG PO TABS
100.0000 mg | ORAL_TABLET | Freq: Two times a day (BID) | ORAL | Status: DC
  Administered 2014-11-25 – 2014-12-01 (×13): 100 mg via ORAL
  Filled 2014-11-25 (×15): qty 1

## 2014-11-25 MED ORDER — CEFTRIAXONE SODIUM IN DEXTROSE 20 MG/ML IV SOLN
1.0000 g | INTRAVENOUS | Status: DC
  Filled 2014-11-25: qty 50

## 2014-11-25 MED ORDER — LATANOPROST 0.005 % OP SOLN
1.0000 [drp] | Freq: Every day | OPHTHALMIC | Status: DC
Start: 2014-11-25 — End: 2014-12-01
  Administered 2014-11-25 – 2014-11-30 (×7): 1 [drp] via OPHTHALMIC
  Filled 2014-11-25: qty 2.5

## 2014-11-25 MED ORDER — HEPARIN SODIUM (PORCINE) 5000 UNIT/ML IJ SOLN: 5000.0000 [IU] | Freq: Three times a day (TID) | INTRAMUSCULAR | Status: DC

## 2014-11-25 MED ORDER — OXYCODONE HCL 5 MG PO TABS: 5.0000 mg | ORAL_TABLET | ORAL | Status: DC | PRN

## 2014-11-25 MED ORDER — VITAMIN D3 50 MCG (2000 UT) PO TABS: 1.0000 | ORAL_TABLET | Freq: Every day | ORAL | Status: DC

## 2014-11-25 MED ORDER — LORAZEPAM 0.5 MG PO TABS
0.5000 mg | ORAL_TABLET | Freq: Once | ORAL | Status: AC
  Administered 2014-11-25: 0.5 mg via ORAL
  Filled 2014-11-25: qty 1

## 2014-11-25 MED ORDER — PANTOPRAZOLE SODIUM 40 MG PO TBEC: 40.0000 mg | DELAYED_RELEASE_TABLET | Freq: Every day | ORAL | Status: DC

## 2014-11-25 MED ORDER — MORPHINE SULFATE 2 MG/ML IJ SOLN: 2.0000 mg | INTRAMUSCULAR | Status: DC | PRN

## 2014-11-25 MED ORDER — ACETAMINOPHEN 325 MG PO TABS: 650.0000 mg | ORAL_TABLET | Freq: Four times a day (QID) | ORAL | Status: DC | PRN

## 2014-11-25 MED ORDER — VITAMIN D 1000 UNITS PO TABS
2000.0000 [IU] | ORAL_TABLET | Freq: Every day | ORAL | Status: DC
  Administered 2014-11-25 – 2014-12-01 (×7): 2000 [IU] via ORAL
  Filled 2014-11-25 (×7): qty 2

## 2014-11-25 MED ORDER — VITAMIN B-12 1000 MCG PO TABS
1000.0000 ug | ORAL_TABLET | Freq: Every day | ORAL | Status: DC
  Administered 2014-11-25 – 2014-12-01 (×7): 1000 ug via ORAL
  Filled 2014-11-25 (×7): qty 1

## 2014-11-25 MED ORDER — CLONIDINE HCL 0.1 MG PO TABS
0.1000 mg | ORAL_TABLET | Freq: Two times a day (BID) | ORAL | Status: DC
  Filled 2014-11-25: qty 1

## 2014-11-25 MED ORDER — SODIUM CHLORIDE 0.9 % IV SOLN
INTRAVENOUS | Status: DC
Start: 2014-11-25 — End: 2014-11-25
  Administered 2014-11-25: 03:00:00 via INTRAVENOUS

## 2014-11-25 MED ORDER — AMANTADINE HCL 50 MG/5ML PO SYRP
100.0000 mg | ORAL_SOLUTION | Freq: Two times a day (BID) | ORAL | Status: DC
  Administered 2014-11-25: 100 mg
  Filled 2014-11-25 (×3): qty 10

## 2014-11-25 MED ORDER — CLONIDINE HCL 0.1 MG PO TABS
0.1000 mg | ORAL_TABLET | Freq: Two times a day (BID) | ORAL | Status: DC
Start: 2014-11-25 — End: 2014-11-28
  Administered 2014-11-25 – 2014-11-28 (×8): 0.1 mg
  Filled 2014-11-25 (×9): qty 1

## 2014-11-25 MED ORDER — DILTIAZEM HCL 30 MG PO TABS
30.0000 mg | ORAL_TABLET | Freq: Four times a day (QID) | ORAL | Status: DC
  Administered 2014-11-25 – 2014-11-27 (×7): 30 mg via ORAL
  Filled 2014-11-25 (×14): qty 1

## 2014-11-25 MED ORDER — SODIUM CHLORIDE 0.9 % IV SOLN: INTRAVENOUS | Status: DC

## 2014-11-25 MED ORDER — METOCLOPRAMIDE HCL 5 MG PO TABS
5.0000 mg | ORAL_TABLET | Freq: Four times a day (QID) | ORAL | Status: DC
  Administered 2014-11-25: 5 mg via ORAL
  Filled 2014-11-25 (×4): qty 1

## 2014-11-25 MED ORDER — LORAZEPAM 2 MG/ML IJ SOLN
0.2500 mg | Freq: Two times a day (BID) | INTRAMUSCULAR | Status: DC | PRN
  Administered 2014-11-25 – 2014-11-26 (×2): 0.25 mg via INTRAVENOUS
  Filled 2014-11-25 (×2): qty 1

## 2014-11-25 MED ORDER — FERROUS SULFATE 325 (65 FE) MG PO TABS
325.0000 mg | ORAL_TABLET | Freq: Every day | ORAL | Status: DC
  Administered 2014-11-25 – 2014-12-01 (×7): 325 mg via ORAL
  Filled 2014-11-25 (×8): qty 1

## 2014-11-25 MED ORDER — CHLORHEXIDINE GLUCONATE 0.12 % MT SOLN
15.0000 mL | Freq: Two times a day (BID) | OROMUCOSAL | Status: DC
  Administered 2014-11-25 – 2014-12-01 (×13): 15 mL via OROMUCOSAL
  Filled 2014-11-25 (×15): qty 15

## 2014-11-25 MED ORDER — METOPROLOL TARTRATE 25 MG/10 ML ORAL SUSPENSION
100.0000 mg | Freq: Two times a day (BID) | ORAL | Status: DC
  Administered 2014-11-25: 100 mg
  Filled 2014-11-25 (×5): qty 40

## 2014-11-25 MED ORDER — FREE WATER
300.0000 mL | Status: DC
  Administered 2014-11-25 – 2014-12-01 (×34): 300 mL

## 2014-11-25 MED ORDER — JEVITY 1.2 CAL PO LIQD: 1000.0000 mL | ORAL | Status: DC

## 2014-11-25 MED ORDER — ENSURE COMPLETE PO LIQD
237.0000 mL | Freq: Three times a day (TID) | ORAL | Status: DC
  Administered 2014-11-25 – 2014-11-27 (×4): 237 mL via ORAL

## 2014-11-25 MED ORDER — FUROSEMIDE 20 MG PO TABS
20.0000 mg | ORAL_TABLET | Freq: Every day | ORAL | Status: DC
  Administered 2014-11-25: 20 mg via ORAL
  Filled 2014-11-25: qty 1

## 2014-11-25 MED ORDER — AMIODARONE HCL 200 MG PO TABS
200.0000 mg | ORAL_TABLET | Freq: Every day | ORAL | Status: DC
  Administered 2014-11-25: 200 mg
  Filled 2014-11-25 (×2): qty 1

## 2014-11-25 MED ORDER — ASPIRIN 325 MG PO TABS
325.0000 mg | ORAL_TABLET | Freq: Every day | ORAL | Status: DC
  Administered 2014-11-25 – 2014-12-01 (×7): 325 mg
  Filled 2014-11-25 (×7): qty 1

## 2014-11-25 MED ORDER — PANTOPRAZOLE SODIUM 40 MG PO PACK
40.0000 mg | PACK | Freq: Every day | ORAL | Status: DC
  Administered 2014-11-25: 40 mg
  Filled 2014-11-25 (×2): qty 20

## 2014-11-25 MED ORDER — INSULIN ASPART 100 UNIT/ML ~~LOC~~ SOLN
0.0000 [IU] | Freq: Three times a day (TID) | SUBCUTANEOUS | Status: DC
  Administered 2014-11-25 – 2014-11-28 (×5): 2 [IU] via SUBCUTANEOUS
  Administered 2014-11-28: 1 [IU] via SUBCUTANEOUS
  Administered 2014-11-28: 2 [IU] via SUBCUTANEOUS

## 2014-11-25 MED ORDER — ACETAMINOPHEN 650 MG RE SUPP: 650.0000 mg | Freq: Four times a day (QID) | RECTAL | Status: DC | PRN

## 2014-11-25 MED ORDER — CETYLPYRIDINIUM CHLORIDE 0.05 % MT LIQD
7.0000 mL | Freq: Two times a day (BID) | OROMUCOSAL | Status: DC
  Administered 2014-11-25 – 2014-12-01 (×13): 7 mL via OROMUCOSAL

## 2014-11-25 MED ORDER — SODIUM CHLORIDE 0.9 % IV SOLN
INTRAVENOUS | Status: DC
Start: 2014-11-25 — End: 2014-11-27
  Administered 2014-11-25 – 2014-11-27 (×4): via INTRAVENOUS

## 2014-11-25 MED ORDER — AMANTADINE HCL 100 MG PO CAPS
100.0000 mg | ORAL_CAPSULE | Freq: Every day | ORAL | Status: DC
  Administered 2014-11-26 – 2014-12-01 (×6): 100 mg via ORAL
  Filled 2014-11-25 (×8): qty 1

## 2014-11-25 MED ORDER — ENOXAPARIN SODIUM 30 MG/0.3ML ~~LOC~~ SOLN
30.0000 mg | SUBCUTANEOUS | Status: DC
  Administered 2014-11-25: 30 mg via SUBCUTANEOUS
  Filled 2014-11-25: qty 0.3

## 2014-11-25 MED ORDER — ACETAMINOPHEN 500 MG PO TABS: 1000.0000 mg | ORAL_TABLET | Freq: Three times a day (TID) | ORAL | Status: DC | PRN

## 2014-11-25 MED ORDER — ONDANSETRON HCL 4 MG/2ML IJ SOLN: 4.0000 mg | Freq: Four times a day (QID) | INTRAMUSCULAR | Status: DC | PRN

## 2014-11-25 MED ORDER — VITAL AF 1.2 CAL PO LIQD
280.0000 mL | Freq: Four times a day (QID) | ORAL | Status: DC
  Administered 2014-11-25 – 2014-11-28 (×9): 280 mL
  Filled 2014-11-25 (×23): qty 474

## 2014-11-25 MED ORDER — ONDANSETRON HCL 4 MG PO TABS: 4.0000 mg | ORAL_TABLET | Freq: Four times a day (QID) | ORAL | Status: DC | PRN

## 2014-11-25 MED ORDER — INSULIN ASPART 100 UNIT/ML ~~LOC~~ SOLN: 0.0000 [IU] | Freq: Every day | SUBCUTANEOUS | Status: DC

## 2014-11-25 MED ORDER — HYDRALAZINE HCL 20 MG/ML IJ SOLN
10.0000 mg | Freq: Four times a day (QID) | INTRAMUSCULAR | Status: DC | PRN
  Administered 2014-11-28: 10 mg via INTRAVENOUS
  Filled 2014-11-25 (×2): qty 1

## 2014-11-25 NOTE — Evaluation (Signed)
Clinical/Bedside Swallow Evaluation Patient Details  Name: Susan Calhoun MRN: [id number] Date of Birth: 08/25/1928  Today's Date: 11/25/2014 Time: 1140-1150 SLP Time Calculation (min) (ACUTE ONLY): 10 min  Past Medical History:  Past Medical History  Diagnosis Date  . Diabetes mellitus with diabetic polyneuropathy   . A-fib   . Renal disorder   . Hypertension   . Anemia   . Glaucoma     right eye   . Carotid artery stenosis   . PVD (peripheral vascular disease)    Past Surgical History:  Past Surgical History  Procedure Laterality Date  . Replacement total knee Right   . Carotid endarterectomy  2015  . Coronary artery bypass graft    . Ablation      cardiac for arrthymia    HPI:  81y/o woman with hx of recent IVH, baseline dementia, DM, A fib presenting with fever and altered mental status. Found to have a UTI in the ED. Encephalopathy is likely related to her UTI exacerbating her underlying dementia, per MD. Polypharmacy is also potentially playing a role in her encephalopathy. CXR clear.    Assessment / Plan / Recommendation Clinical Impression  Pt demonstrates a primary cognitive based dysphagia impacting oral function. Pt is known to this therapist from prior admissions. Function appears consistent with these observations. Pt is able to accept small sips of thin liquids with max assist for feeding. She has a prolonged oral phase and hesitates to initiate posterior transit of puree and liquid boluses. There is no sign of penetration or aspiration. Per SNF records, pt has been allowed thin liquids and purees, but uses the PEG as a primary source of nutrition. Recommend pt resume this plan during admission. No further SLP interventions needed as pt is at baseline. SLP will sign off.     Aspiration Risk  Moderate    Diet Recommendation Dysphagia 1 (Puree);Thin liquid   Liquid Administration via: Cup Medication Administration: Via alternative means Compensations: Slow  rate;Small sips/bites Postural Changes and/or Swallow Maneuvers: Seated upright 90 degrees    Other  Recommendations Oral Care Recommendations: Oral care BID   Follow Up Recommendations  Skilled Nursing facility    Frequency and Duration        Pertinent Vitals/Pain NA    SLP Swallow Goals     Swallow Study Prior Functional Status       General HPI: 81y/o woman with hx of recent IVH, baseline dementia, DM, A fib presenting with fever and altered mental status. Found to have a UTI in the ED. Encephalopathy is likely related to her UTI exacerbating her underlying dementia, per MD. Polypharmacy is also potentially playing a role in her encephalopathy. CXR clear.  Type of Study: Bedside swallow evaluation Diet Prior to this Study: NPO;PEG tube Temperature Spikes Noted: No Respiratory Status: Room air History of Recent Intubation: No Behavior/Cognition: Alert;Cooperative;Pleasant mood;Confused;Requires cueing Oral Cavity - Dentition: Adequate natural dentition Self-Feeding Abilities: Needs assist Patient Positioning: Upright in bed Baseline Vocal Quality: Clear;Low vocal intensity Volitional Cough: Strong Volitional Swallow: Unable to elicit    Oral/Motor/Sensory Function Overall Oral Motor/Sensory Function: Appears within functional limits for tasks assessed   Ice Chips     Thin Liquid Thin Liquid: Impaired Presentation: Cup Oral Phase Impairments: Poor awareness of bolus;Reduced lingual movement/coordination Oral Phase Functional Implications: Prolonged oral transit    Nectar Thick Nectar Thick Liquid: Not tested   Honey Thick Honey Thick Liquid: Not tested   Puree Puree: Impaired Presentation: Spoon Oral Phase Impairments: Reduced  lingual movement/coordination Oral Phase Functional Implications: Prolonged oral transit   Solid   GO    Solid: Not tested      Harlon Ditty, MA CCC-SLP [phone]  Claudine Mouton 11/25/2014,12:05 PM

## 2014-11-25 NOTE — Consult Note (Signed)
Consult Reason for Consult:altered mental status Referring Physician: Dr Elvera Lennox  CC: altered mental status  HPI: Susan Calhoun is an 81 y.o. female with complicated medical history including baseline dementia and recent IVH with hydrocephalus and drain, presenting for fever and decreased mentation. In ED found to have a UTI and renal insufficiency and was admitted for further workup and treatment. Family requested neurology consult for further evaluation of altered mental status.   Past Medical History  Diagnosis Date  . Diabetes mellitus with diabetic polyneuropathy   . A-fib   . Renal disorder   . Hypertension   . Anemia   . Glaucoma     right eye   . Carotid artery stenosis   . PVD (peripheral vascular disease)     Past Surgical History  Procedure Laterality Date  . Replacement total knee Right   . Carotid endarterectomy  2015  . Coronary artery bypass graft    . Ablation      cardiac for arrthymia     History reviewed. No pertinent family history.  Social History:  reports that she has never smoked. She does not have any smokeless tobacco history on file. She reports that she does not drink alcohol or use illicit drugs.  Allergies  Allergen Reactions  . Septra [Sulfamethoxazole-Trimethoprim] Hives    Medications:  Scheduled: . allopurinol  100 mg Oral BID  . amantadine  100 mg Oral Daily  . amiodarone  200 mg Per Tube Daily  . antiseptic oral rinse  7 mL Mouth Rinse q12n4p  . aspirin  325 mg Per Tube Daily  . atorvastatin  80 mg Oral Daily  . chlorhexidine  15 mL Mouth Rinse BID  . cholecalciferol  2,000 Units Oral Daily  . cloNIDine  0.1 mg Per Tube BID  . diltiazem  30 mg Oral 4 times per day  . enoxaparin (LOVENOX) injection  30 mg Subcutaneous Q24H  . feeding supplement (ENSURE COMPLETE)  237 mL Oral TID WC  . feeding supplement (VITAL AF 1.2 CAL)  280 mL Per Tube QID  . ferrous sulfate  325 mg Oral Q breakfast  . free water  300 mL Per Tube Q4H  .  furosemide  20 mg Oral Daily  . insulin aspart  0-5 Units Subcutaneous QHS  . insulin aspart  0-9 Units Subcutaneous TID WC  . latanoprost  1 drop Right Eye QHS  . metoCLOPramide  5 mg Oral QID  . metoprolol tartrate  100 mg Per Tube BID  . pantoprazole  40 mg Oral Daily  . pantoprazole sodium  40 mg Per Tube Daily  . vitamin B-12  1,000 mcg Oral Daily     ROS: Out of a complete 14 system review, the patient complains of only the following symptoms, and all other reviewed systems are negative. +fatigue, confusion  Physical Examination: Filed Vitals:   11/25/14 0601  BP: 147/31  Pulse: 50  Temp: 98.9 F (37.2 C)  Resp: 18   Physical Exam  Constitutional: He appears well-developed and well-nourished.  Psych: Affect appropriate to situation Eyes: No scleral injection HENT: No OP obstrucion Head: Normocephalic.  Cardiovascular: Normal rate and regular rhythm.  Respiratory: Effort normal and breath sounds normal.  GI: Soft. Bowel sounds are normal. No distension. There is no tenderness.  Skin: WDI  Neurologic Examination Mental Status: Eyes open, makes eye contact and tracks examiner. Intermittently will tell me her first name and follow simple commands.  Cranial Nerves: II: unable to visualize fundi, visual  fields grossly normal, pupils equal, round, reactive to light  III,IV, VI: ptosis not present, extra-ocular motions intact bilaterally V,VII: smile symmetric, facial light touch sensation normal bilaterally VIII: hearing normal bilaterally IX,X: gag reflex present XI: trapezius strength/neck flexion strength normal bilaterally XII: tongue strength normal  Motor: Mild generalized tremulousness. Due to mental status unable to formally test but appears to move all extremities symmetrically Sensory:  light touch intact throughout, bilaterally Deep Tendon Reflexes: 2+ and symmetric throughout Plantars: Right: downgoing   Left: downgoing Cerebellar: Unable to test  due to mental status Gait: normal gait and station  Laboratory Studies:   Basic Metabolic Panel:  Recent Labs Lab 11/24/14 2136  NA 140  K 4.3  CL 108  CO2 20  GLUCOSE 144*  BUN 65*  CREATININE 1.56*  CALCIUM 9.2    Liver Function Tests:  Recent Labs Lab 11/24/14 2136  AST 152*  ALT 233*  ALKPHOS 211*  BILITOT 0.5  PROT 6.7  ALBUMIN 2.8*   No results for input(s): LIPASE, AMYLASE in the last 168 hours. No results for input(s): AMMONIA in the last 168 hours.  CBC:  Recent Labs Lab 11/24/14 2136  WBC 9.1  NEUTROABS 5.4  HGB 10.9*  HCT 34.0*  MCV 94.7  PLT 309    Cardiac Enzymes: No results for input(s): CKTOTAL, CKMB, CKMBINDEX, TROPONINI in the last 168 hours.  BNP: Invalid input(s): POCBNP  CBG:  Recent Labs Lab 11/25/14 0257 11/25/14 0811  GLUCAP 119* 102*    Microbiology: Results for orders placed or performed during the hospital encounter of 11/24/14  MRSA PCR Screening     Status: None   Collection Time: 11/25/14  2:00 AM  Result Value Ref Range Status   MRSA by PCR NEGATIVE NEGATIVE Final    Comment:        The GeneXpert MRSA Assay (FDA approved for NASAL specimens only), is one component of a comprehensive MRSA colonization surveillance program. It is not intended to diagnose MRSA infection nor to guide or monitor treatment for MRSA infections.     Coagulation Studies: No results for input(s): LABPROT, INR in the last 72 hours.  Urinalysis:  Recent Labs Lab 11/24/14 2207  COLORURINE YELLOW  LABSPEC 1.014  PHURINE 7.5  GLUCOSEU NEGATIVE  HGBUR NEGATIVE  BILIRUBINUR NEGATIVE  KETONESUR NEGATIVE  PROTEINUR >300*  UROBILINOGEN 1.0  NITRITE NEGATIVE  LEUKOCYTESUR LARGE*    Lipid Panel:     Component Value Date/Time   CHOL 204* 09/02/2014 0530   TRIG 149 09/02/2014 0530   HDL 64 09/02/2014 0530   CHOLHDL 3.2 09/02/2014 0530   VLDL 30 09/02/2014 0530   LDLCALC 110* 09/02/2014 0530    HgbA1C:  Lab Results   Component Value Date   HGBA1C 5.6 09/01/2014    Urine Drug Screen:  No results found for: LABOPIA, COCAINSCRNUR, LABBENZ, AMPHETMU, THCU, LABBARB  Alcohol Level: No results for input(s): ETH in the last 168 hours.  Other results:  Imaging: Dg Chest 1 View  11/24/2014   CLINICAL DATA:  Shortness of breath.  Urinary tract infection.  EXAM: CHEST - 1 VIEW  COMPARISON:  10/08/2014  FINDINGS: Patient is post median sternotomy. The cardiomediastinal contours are unchanged. Heart is at the upper limits of normal in size. No pulmonary edema. No consolidation to suggest pneumonia. No large pleural effusion. No pneumothorax. Chronic change of both shoulders again seen.  IMPRESSION: No acute pulmonary process.   Electronically Signed   By: Lujean RaveMelanie  Ehinger M.D.  On: 11/24/2014 23:11   Dg Abd 1 View  11/24/2014   CLINICAL DATA:  Complications of feeding tube.  EXAM: ABDOMEN - 1 VIEW  COMPARISON:  10/18/2014  FINDINGS: Gastrostomy tube tip and balloon the are localized to the left upper quadrant, consistent with location in the stomach. Positioning is similar to previous study. Normal bowel gas pattern. Degenerative changes in the spine and hips. Calcifications in the pelvis consistent with phleboliths.  IMPRESSION: Gastrostomy tube projects over the stomach.   Electronically Signed   By: Burman Nieves M.D.   On: 11/24/2014 23:07     Assessment/Plan:  81y/o woman with hx of recent IVH, baseline dementia, DM, A fib presenting with fever and altered mental status. Found to have a UTI in the ED. Suspect her encephalopathy is likely related to her UTI exacerbating her underlying dementia. Polypharmacy is also potentially playing a role in her encephalopathy. Have low suspicion her altered mental status is related to a worsening of her prior IVH.   -continue UTI treatment per primary team -metabolic corrections per primary team -can check head CT -would hold on EEG at this time   Elspeth Cho,  DO [facility] [phone]  If 7pm- 7am, please page neurology on call as listed in AMION. 11/25/2014, 10:30 AM

## 2014-11-25 NOTE — Progress Notes (Signed)
Pt came up from ED shaking uncontrollably. Daughter stated that the patient is on ativan 0.5mg  twice a day. RN looked and it is in the paperwork from the SNF. On call provider notified, 0.5mg  of ativan ordered once.

## 2014-11-25 NOTE — H&P (Signed)
[facility]                                                                                    Patient Demographics  Susan Calhoun, is a 81 y.o. female  CSN: [id number]  MRN: [id number]  DOB - [birth year]  Admit Date - 11/24/2014  Outpatient Primary MD for the patient is Pcp Not In System   With History of -  Past Medical History  Diagnosis Date  . Diabetes mellitus with diabetic polyneuropathy   . A-fib   . Renal disorder   . Hypertension   . Anemia   . Glaucoma     right eye   . Carotid artery stenosis   . PVD (peripheral vascular disease)       Past Surgical History  Procedure Laterality Date  . Replacement total knee Right   . Carotid endarterectomy  2015  . Coronary artery bypass graft    . Ablation      cardiac for arrthymia     in for   Chief Complaint  Patient presents with  . Urinary Tract Infection     HPI  Susan Calhoun  is a 81 y.o. female, nursing home resident with a past medical history significant for atrial fibrillation , diabetes mellitus and dysphagia on tube feeding sent for evaluation for fever and decreased mentation patient has history of dementia and recent hemorrhagic stroke. There was concern about possible vaginal discharge as well , in the nursing home. In the emergency room the patient was found to have a urinary tract infection with renal insufficiency and I was asked to admit. The patient does not look toxic and had a temperature of 99 9.    Review of Systems    Unable to obtain   Social History History  Substance Use Topics  . Smoking status: Never Smoker   . Smokeless tobacco: Not on file  . Alcohol Use: No     Family History Unable to obtain  Prior to Admission medications   Medication Sig Start Date End Date Taking? Authorizing Provider  acetaminophen (TYLENOL) 500 MG tablet Take 1,000 mg by mouth 3 (three) times daily as needed (pain). Per tube   Yes Historical Provider, MD  allopurinol  (ZYLOPRIM) 100 MG tablet Take 100 mg by mouth 2 (two) times daily.    Yes Historical Provider, MD  amantadine (SYMMETREL) 100 MG capsule Take 100 mg by mouth daily. Per tube   Yes Historical Provider, MD  amiodarone (PACERONE) 200 MG tablet Place 1 tablet (200 mg total) into feeding tube daily. 09/26/14  Yes Layne Benton, NP  aspirin 325 MG tablet Place 1 tablet (325 mg total) into feeding tube daily. 10/21/14  Yes Catarina Hartshorn, MD  atorvastatin (LIPITOR) 80 MG tablet Take 80 mg by mouth daily.   Yes Historical Provider, MD  Cholecalciferol (VITAMIN D3) 2000 UNITS TABS Take 1 tablet by mouth daily. Per tube   Yes Historical Provider, MD  cloNIDine (CATAPRES) 0.1 MG tablet Take 0.1 mg by mouth 2 (two) times daily. Per tube   Yes Historical Provider, MD  Cyanocobalamin (VITAMIN B-12 PO) Take 1 tablet by mouth daily.   Yes Historical Provider, MD  diltiazem (CARDIZEM) 30 MG tablet Take 1 tablet (30 mg total) by mouth every 6 (six) hours. Patient taking differently: Take 30 mg by mouth every 6 (six) hours. Per tube 09/26/14  Yes Layne BentonSharon L Biby, NP  ferrous sulfate 325 (65 FE) MG tablet Take 325 mg by mouth daily with breakfast.   Yes Historical Provider, MD  furosemide (LASIX) 20 MG tablet Take 20 mg by mouth daily.   Yes Historical Provider, MD  metoCLOPramide (REGLAN) 5 MG tablet Take 1 tablet (5 mg total) by mouth 4 (four) times daily. 10/21/14  Yes Catarina Hartshornavid Tat, MD  metoprolol tartrate (LOPRESSOR) 25 mg/10 mL SUSP Place 40 mLs (100 mg total) into feeding tube 2 (two) times daily. 09/30/14  Yes Layne BentonSharon L Biby, NP  pantoprazole (PROTONIX) 40 MG tablet Take 40 mg by mouth daily.   Yes Historical Provider, MD  Travoprost, BAK Free, (TRAVATAN) 0.004 % SOLN ophthalmic solution Place 1 drop into the right eye at bedtime.   Yes Historical Provider, MD  amantadine (SYMMETREL) 50 MG/5ML solution Place 10 mLs (100 mg total) into feeding tube 2 (two) times daily. Patient not taking: Reported on 11/24/2014 10/21/14    Catarina Hartshornavid Tat, MD  Cholecalciferol (VITAMIN D3) 2000 UNITS TABS Take 1 tablet by mouth daily.    Historical Provider, MD  cloNIDine (CATAPRES) 0.1 MG tablet Place 1 tablet (0.1 mg total) into feeding tube 2 (two) times daily. Patient not taking: Reported on 11/24/2014 09/30/14   Layne BentonSharon L Biby, NP  feeding supplement, ENSURE COMPLETE, (ENSURE COMPLETE) LIQD Take 237 mLs by mouth 3 (three) times daily with meals. Patient not taking: Reported on 11/24/2014 09/30/14   Marvel PlanJindong Xu, MD  Nutritional Supplements (FEEDING SUPPLEMENT, VITAL AF 1.2 CAL,) LIQD Place 270 mLs into feeding tube 4 (four) times daily - after meals and at bedtime. Patient not taking: Reported on 11/24/2014 09/26/14   Layne BentonSharon L Biby, NP  Nutritional Supplements (FEEDING SUPPLEMENT, VITAL AF 1.2 CAL,) LIQD Place 280 mLs into feeding tube 4 (four) times daily. Patient not taking: Reported on 11/24/2014 10/21/14   Catarina Hartshornavid Tat, MD  pantoprazole sodium (PROTONIX) 40 mg/20 mL PACK Place 20 mLs (40 mg total) into feeding tube daily. Patient not taking: Reported on 11/24/2014 09/26/14   Layne BentonSharon L Biby, NP  Water For Irrigation, Sterile (FREE WATER) SOLN Place 300 mLs into feeding tube every 4 (four) hours. 09/30/14   Layne BentonSharon L Biby, NP    Allergies  Allergen Reactions  . Septra [Sulfamethoxazole-Trimethoprim] Hives    Physical Exam  Vitals  Blood pressure 144/117, pulse 54, temperature 99.9 F (37.7 C), temperature source Rectal, resp. rate 20, SpO2 97 %.   1. General elderly lady lying in bed, well-developed  2. Confused/demented.  3. Decreased mentation, moving all extremities.  4. Ears and Eyes appear Normal, Conjunctivae clear, PERRLA. Moist Oral Mucosa.  5. Supple Neck, No JVD, No cervical lymphadenopathy appriciated, No Carotid Bruits.  6. Symmetrical Chest wall movement, Good air movement bilaterally, CTAB.  7. Irregularly irregular , No Parasternal Heave.  8. Positive Bowel Sounds, Abdomen Soft, Non tender, Peg tube in place No  organomegaly appriciated,No rebound -guarding or rigidity.  9.  No Cyanosis, Normal Skin Turgor, No Skin Rash or Bruise.  10. Good muscle tone,  joints appear normal , no effusions, Normal ROM.  11. No Palpable Lymph Nodes in Neck or Axillae    Data Review  CBC  Recent Labs Lab 11/24/14 2136  WBC 9.1  HGB 10.9*  HCT 34.0*  PLT 309  MCV 94.7  MCH 30.4  MCHC 32.1  RDW 16.2*  LYMPHSABS 2.7  MONOABS 0.8  EOSABS 0.2  BASOSABS 0.1   ------------------------------------------------------------------------------------------------------------------  Chemistries   Recent Labs Lab 11/24/14 2136  NA 140  K 4.3  CL 108  CO2 20  GLUCOSE 144*  BUN 65*  CREATININE 1.56*  CALCIUM 9.2  AST 152*  ALT 233*  ALKPHOS 211*  BILITOT 0.5   ------------------------------------------------------------------------------------------------------------------ CrCl cannot be calculated (Unknown ideal weight.). ------------------------------------------------------------------------------------------------------------------ No results for input(s): TSH, T4TOTAL, T3FREE, THYROIDAB in the last 72 hours.  Invalid input(s): FREET3   Coagulation profile No results for input(s): INR, PROTIME in the last 168 hours. ------------------------------------------------------------------------------------------------------------------- No results for input(s): DDIMER in the last 72 hours. -------------------------------------------------------------------------------------------------------------------  Cardiac Enzymes No results for input(s): CKMB, TROPONINI, MYOGLOBIN in the last 168 hours.  Invalid input(s): CK ------------------------------------------------------------------------------------------------------------------ Invalid input(s): POCBNP   ---------------------------------------------------------------------------------------------------------------  Urinalysis    Component  Value Date/Time   COLORURINE YELLOW 11/24/2014 2207   APPEARANCEUR CLOUDY* 11/24/2014 2207   LABSPEC 1.014 11/24/2014 2207   PHURINE 7.5 11/24/2014 2207   GLUCOSEU NEGATIVE 11/24/2014 2207   HGBUR NEGATIVE 11/24/2014 2207   BILIRUBINUR NEGATIVE 11/24/2014 2207   KETONESUR NEGATIVE 11/24/2014 2207   PROTEINUR >300* 11/24/2014 2207   UROBILINOGEN 1.0 11/24/2014 2207   NITRITE NEGATIVE 11/24/2014 2207   LEUKOCYTESUR LARGE* 11/24/2014 2207    ----------------------------------------------------------------------------------------------------------------  Imaging results:   Dg Chest 1 View  11/24/2014   CLINICAL DATA:  Shortness of breath.  Urinary tract infection.  EXAM: CHEST - 1 VIEW  COMPARISON:  10/08/2014  FINDINGS: Patient is post median sternotomy. The cardiomediastinal contours are unchanged. Heart is at the upper limits of normal in size. No pulmonary edema. No consolidation to suggest pneumonia. No large pleural effusion. No pneumothorax. Chronic change of both shoulders again seen.  IMPRESSION: No acute pulmonary process.   Electronically Signed   By: Rubye Oaks M.D.   On: 11/24/2014 23:11   Dg Abd 1 View  11/24/2014   CLINICAL DATA:  Complications of feeding tube.  EXAM: ABDOMEN - 1 VIEW  COMPARISON:  10/18/2014  FINDINGS: Gastrostomy tube tip and balloon the are localized to the left upper quadrant, consistent with location in the stomach. Positioning is similar to previous study. Normal bowel gas pattern. Degenerative changes in the spine and hips. Calcifications in the pelvis consistent with phleboliths.  IMPRESSION: Gastrostomy tube projects over the stomach.   Electronically Signed   By: Burman Nieves M.D.   On: 11/24/2014 23:07      Assessment & Plan  1. Urinary tract infection 2. Mild renal insufficiency 3. History of atrial fibrillation with hemorrhagic stroke, nursing home bound on tube feeds per PEG  Plan  IV Rocephin IV hydration\ Check cultures  DVT  Prophylaxis Lovenox  AM Labs Ordered, also please review Full Orders  Family Communication: Admission, patients condition and plan of care including tests being ordered have been discussed with the daughter who indicates understanding and agrees with the plan  Code Status DO NOT RESUSCITATE  Disposition Plan: Back to nursing home  Time spent in minutes : 34 minutes  Condition GUARDED   @

## 2014-11-25 NOTE — Progress Notes (Addendum)
PROGRESS NOTE  Susan Calhoun XIP:[id number] DOB: [birth year] DOA: 11/24/2014 PCP: Pcp Not In System  HPI/ Susan Calhoun is a 81 yo female coming in from SNF with a UTI and altered mentation.  Hx of hemorrhagic stroke on 09/01/14.  Granddaughter reports days of alertness mixed with days of confusion, hallucinations, and mixed up speech post stroke.  Had a UTI in November and presented the same way - lack of speech and inability to follow commands.  Chronic tremors.  Granddaughter reports "bone protrusion" below her neck that is bruised and variable in size.    Subjective: - no apparent discomfort, tries to talk, follows commands intermittently  Assessment/Plan:  UTI UTI found in ED based on UA and microscopic (11/24/13) - many bacteria present, increased WBC Levofloxacin for hx of klebsiella resistant to cephalosporin Cultures are pending.  Altered mental status post hemorrhagic stroke Hemorrhagic stroke 09/01/14, possibly exacerbated by UTI CT scan without contrast ordered Neuro consult.  Acute renal failure Previous creatinine in 09/2014 was normal. Possibly due dehydration and UTI Hold lasix.  Increased IVF for 12 hours.  Elevated LFTS Transaminases and Alk phos are elevated. Bili is normal.   Uncertain etiology. Patient is on Amiodarone, Reglan and a statin. Will hold the statin and the reglan.   Speak with family about how long she has been on these medications.  Will re-evaluate amio.  Tremors Unknown etiology, chronic Was taking Ativan at nursing home for tremors Lorazepam injection 0.25 mg ordered 1/5.  Holding Reglan which can cause tardive dyskinesia.  DVT Prophylaxis:  heparin  Code Status: DNR Family Communication: Susan Calhoun, granddaughter 1/5 am to get further hx  Disposition Plan: inpatient   Consultants:  Neuro  Procedures:  CT head without contrast  Antibiotics:  Levofloxacin per pharmacy consult  Objective: Filed Vitals:   11/25/14 0146  11/25/14 0155 11/25/14 0340 11/25/14 0601  BP:   154/35 147/31  Pulse:  60  50  Temp:  98.9 F (37.2 C)  98.9 F (37.2 C)  TempSrc:  Axillary  Axillary  Resp:  18  18  Height: 4' 11"  (1.499 m)     Weight:  55.112 kg (121 lb 8 oz)    SpO2:  99%  95%    Intake/Output Summary (Last 24 hours) at 11/25/14 1412 Last data filed at 11/25/14 1343  Gross per 24 hour  Intake   1087 ml  Output    550 ml  Net    537 ml   Filed Weights   11/25/14 0155  Weight: 55.112 kg (121 lb 8 oz)   Exam: General: Well developed, NAD, appears stated age, laying in bed  HEENT: Anicteic Sclera, MMM.  Neck: Supple, no JVD, no masses, proximal end of right clavicle slightly bruised and tender to palpation  Cardiovascular: S1 S2 auscultated, no murmurs heard  Respiratory: Clear to auscultation bilaterally Neuro: Since am rounding - more alert, makes eye contact, responds to simple commands and yes/no questions. Right hand strength 2/5, left hand 1/5, feet 2/5 Psych: minimally interactive  Data Reviewed: Basic Metabolic Panel:  Recent Labs Lab 11/24/14 2136  NA 140  K 4.3  CL 108  CO2 20  GLUCOSE 144*  BUN 65*  CREATININE 1.56*  CALCIUM 9.2   Liver Function Tests:  Recent Labs Lab 11/24/14 2136  AST 152*  ALT 233*  ALKPHOS 211*  BILITOT 0.5  PROT 6.7  ALBUMIN 2.8*   CBC:  Recent Labs Lab 11/24/14 2136  WBC 9.1  NEUTROABS 5.4  HGB 10.9*  HCT 34.0*  MCV 94.7  PLT 309   BNP (last 3 results)  Recent Labs  09/16/14 1730  PROBNP 9594.0*   CBG:  Recent Labs Lab 11/25/14 0257 11/25/14 0811 11/25/14 1303  GLUCAP 119* 102* 165*    Recent Results (from the past 240 hour(s))  MRSA PCR Screening     Status: None   Collection Time: 11/25/14  2:00 AM  Result Value Ref Range Status   MRSA by PCR NEGATIVE NEGATIVE Final    Comment:        The GeneXpert MRSA Assay (FDA approved for NASAL specimens only), is one component of a comprehensive MRSA colonization surveillance  program. It is not intended to diagnose MRSA infection nor to guide or monitor treatment for MRSA infections.      Studies: Dg Chest 1 View  11/24/2014   CLINICAL DATA:  Shortness of breath.  Urinary tract infection.  EXAM: CHEST - 1 VIEW  COMPARISON:  10/08/2014  FINDINGS: Patient is post median sternotomy. The cardiomediastinal contours are unchanged. Heart is at the upper limits of normal in size. No pulmonary edema. No consolidation to suggest pneumonia. No large pleural effusion. No pneumothorax. Chronic change of both shoulders again seen.  IMPRESSION: No acute pulmonary process.   Electronically Signed   By: Jeb Levering M.D.   On: 11/24/2014 23:11   Dg Abd 1 View  11/24/2014   CLINICAL DATA:  Complications of feeding tube.  EXAM: ABDOMEN - 1 VIEW  COMPARISON:  10/18/2014  FINDINGS: Gastrostomy tube tip and balloon the are localized to the left upper quadrant, consistent with location in the stomach. Positioning is similar to previous study. Normal bowel gas pattern. Degenerative changes in the spine and hips. Calcifications in the pelvis consistent with phleboliths.  IMPRESSION: Gastrostomy tube projects over the stomach.   Electronically Signed   By: Lucienne Capers M.D.   On: 11/24/2014 23:07    Scheduled Meds: . allopurinol  100 mg Oral BID  . amantadine  100 mg Oral Daily  . amiodarone  200 mg Per Tube Daily  . antiseptic oral rinse  7 mL Mouth Rinse q12n4p  . aspirin  325 mg Per Tube Daily  . chlorhexidine  15 mL Mouth Rinse BID  . cholecalciferol  2,000 Units Oral Daily  . cloNIDine  0.1 mg Per Tube BID  . diltiazem  30 mg Oral 4 times per day  . feeding supplement (ENSURE COMPLETE)  237 mL Oral TID WC  . feeding supplement (VITAL AF 1.2 CAL)  280 mL Per Tube QID  . ferrous sulfate  325 mg Oral Q breakfast  . free water  300 mL Per Tube Q4H  . heparin subcutaneous  5,000 Units Subcutaneous 3 times per day  . insulin aspart  0-5 Units Subcutaneous QHS  . insulin aspart   0-9 Units Subcutaneous TID WC  . latanoprost  1 drop Right Eye QHS  . levofloxacin  250 mg Oral Daily  . metoprolol tartrate  100 mg Per Tube BID  . pantoprazole  40 mg Oral Daily  . pantoprazole sodium  40 mg Per Tube Daily  . vitamin B-12  1,000 mcg Oral Daily   Continuous Infusions: . sodium chloride      Active Problems:   Acute encephalopathy   UTI (lower urinary tract infection)   DNR (do not resuscitate) discussion   Urinary tract infection   Elevated LFTs   Acute renal failure  Chrys Racer E. Howell-Methvin, PA-S; Benjamine Mola  MacArthur, PA-S Imogene Burn, [place] [facility] Pager [phone]. If 7PM-7AM, please contact night-coverage at [email or web address], password [facility] 11/25/2014, 2:12 PM  LOS: 1 day    Patient was seen, examined,treatment plan was discussed with the Advance Practice Provider.  I have directly reviewed the clinical findings, lab, imaging studies and management of this patient in detail. I have made the necessary changes to the above noted documentation, and agree with the documentation, as recorded by the Advance Practice Provider.    Marzetta Board, MD [facility] [phone]

## 2014-11-25 NOTE — ED Notes (Signed)
Unable to take report.

## 2014-11-25 NOTE — Progress Notes (Signed)
Daughter would like for Neurology to be consulted for her mother.

## 2014-11-25 NOTE — Progress Notes (Signed)
Notified MD Gherghe that patients heart rate is 48. Awaiting orders

## 2014-11-25 NOTE — Progress Notes (Signed)
INITIAL NUTRITION ASSESSMENT  DOCUMENTATION CODES Per approved criteria  -Not Applicable   INTERVENTION: Continue Ensure Complete po TID, each supplement provides 350 kcal and 13 grams of protein.  Continue bolus tube feeding of Vital AF 1.2 via PEG @ 280 ml, 4 times daily to provide 1344 kcal, 84 grams of protein, and 907 ml of free water.   Continue free water flushes.  Encourage PO intake.  Will continue to monitor.  NUTRITION DIAGNOSIS: Inadequate oral intake related to dysphagia as evidenced by tube feeding via PEG.   Goal: Pt to meet >/= 90% of their estimated nutrition needs   Monitor:  PO intake, bolus TF tolerance, weight trends, labs, I/O's  Reason for Assessment: MST/New TF  81 y.o. female  Admitting Dx: UTI  ASSESSMENT: Nursing home resident with a past medical history significant for atrial fibrillation , diabetes mellitus and dysphagia on tube feeding sent for evaluation for fever and decreased mentation patient has history of dementia and recent hemorrhagic stroke. Pt was found to have a urinary tract infection with renal insufficiency.   Pt is familiar to this RD due to previous admission. No family at bedside. Pt unable to answer questions asked, however was able to nod "yes" or "no" to some questions. Pt has been advanced to a dysphagia 1 diet with thin liquids. Per RN, pt is currently NPO for anticipation of ultrasound. Per pt, she reports she has been NPO PTA and has been getting nutrition through her PEG. Will continue with current bolus tube feeding orders. Noticed TF protocol has not been put in place. RD to order per MD. Noticed per Epic weight records, pt with a 15.9% weight loss in 1 month. Pt has Ensure ordered. Will continue.  Pt with no observed significant fat or muscle mass loss.  Labs: Low GFR. High BUN, creatinine, AST, ALT, alkaline phosphatase.  Height: Ht Readings from Last 1 Encounters:  11/25/14  (1.499 m)    Weight: Wt  Readings from Last 1 Encounters:  11/25/14 121 lb 8 oz (55.112 kg)    Ideal Body Weight: 98 lbs  % Ideal Body Weight: 123%  Wt Readings from Last 10 Encounters:  11/25/14 121 lb 8 oz (55.112 kg)  10/21/14 144 lb 6.4 oz (65.5 kg)  09/15/14 146 lb 2.6 oz (66.3 kg)  08/31/14 140 lb (63.504 kg)    Usual Body Weight: 140 lbs  % Usual Body Weight: 86%  BMI:  Body mass index is 24.53 kg/(m^2).  Estimated Nutritional Needs: Kcal: 1350-1700 Protein: 75-90 grams Fluid: >/= 1.5 L/day  Skin: DTI on coccyx  Diet Order: Diet NPO time specified  EDUCATION NEEDS: -Education not appropriate at this time   Intake/Output Summary (Last 24 hours) at 11/25/14 1223 Last data filed at 11/25/14 1039  Gross per 24 hour  Intake    550 ml  Output    550 ml  Net      0 ml    Last BM: 1/5  Labs:   Recent Labs Lab 11/24/14 2136  NA 140  K 4.3  CL 108  CO2 20  BUN 65*  CREATININE 1.56*  CALCIUM 9.2  GLUCOSE 144*    CBG (last 3)   Recent Labs  11/25/14 0257 11/25/14 0811  GLUCAP 119* 102*    Scheduled Meds: . allopurinol  100 mg Oral BID  . amantadine  100 mg Oral Daily  . amiodarone  200 mg Per Tube Daily  . antiseptic oral rinse  7 mL Mouth  Rinse q12n4p  . aspirin  325 mg Per Tube Daily  . atorvastatin  80 mg Oral Daily  . chlorhexidine  15 mL Mouth Rinse BID  . cholecalciferol  2,000 Units Oral Daily  . cloNIDine  0.1 mg Per Tube BID  . diltiazem  30 mg Oral 4 times per day  . enoxaparin (LOVENOX) injection  30 mg Subcutaneous Q24H  . feeding supplement (ENSURE COMPLETE)  237 mL Oral TID WC  . feeding supplement (VITAL AF 1.2 CAL)  280 mL Per Tube QID  . ferrous sulfate  325 mg Oral Q breakfast  . free water  300 mL Per Tube Q4H  . furosemide  20 mg Oral Daily  . insulin aspart  0-5 Units Subcutaneous QHS  . insulin aspart  0-9 Units Subcutaneous TID WC  . latanoprost  1 drop Right Eye QHS  . levofloxacin  250 mg Oral Daily  . metoCLOPramide  5 mg Oral QID   . metoprolol tartrate  100 mg Per Tube BID  . pantoprazole  40 mg Oral Daily  . pantoprazole sodium  40 mg Per Tube Daily  . vitamin B-12  1,000 mcg Oral Daily    Continuous Infusions: . sodium chloride 50 mL/hr at 11/25/14 [id number]    Past Medical History  Diagnosis Date  . Diabetes mellitus with diabetic polyneuropathy   . A-fib   . Renal disorder   . Hypertension   . Anemia   . Glaucoma     right eye   . Carotid artery stenosis   . PVD (peripheral vascular disease)     Past Surgical History  Procedure Laterality Date  . Replacement total knee Right   . Carotid endarterectomy  2015  . Coronary artery bypass graft    . Ablation      cardiac for arrthymia     Marijean NiemannStephanie La, MS, RD, LDN Pager # [phone] After hours/ weekend pager # [phone]

## 2014-11-25 NOTE — Progress Notes (Signed)
ANTIBIOTIC CONSULT NOTE - INITIAL  Pharmacy Consult for Levaquin Indication: UTI  Allergies  Allergen Reactions  . Septra [Sulfamethoxazole-Trimethoprim] Hives    Patient Measurements: Height: 4\' 11"  (149.9 cm) Weight: 121 lb 8 oz (55.112 kg) IBW/kg (Calculated) : 43.2  Labs:  Recent Labs  11/24/14 2136  WBC 9.1  HGB 10.9*  PLT 309  CREATININE 1.56*   Estimated Creatinine Clearance: 20.3 mL/min (by C-G formula based on Cr of 1.56). No results for input(s): VANCOTROUGH, VANCOPEAK, VANCORANDOM, GENTTROUGH, GENTPEAK, GENTRANDOM, TOBRATROUGH, TOBRAPEAK, TOBRARND, AMIKACINPEAK, AMIKACINTROU, AMIKACIN in the last 72 hours.   Microbiology: Recent Results (from the past 720 hour(s))  MRSA PCR Screening     Status: None   Collection Time: 11/25/14  2:00 AM  Result Value Ref Range Status   MRSA by PCR NEGATIVE NEGATIVE Final    Comment:        The GeneXpert MRSA Assay (FDA approved for NASAL specimens only), is one component of a comprehensive MRSA colonization surveillance program. It is not intended to diagnose MRSA infection nor to guide or monitor treatment for MRSA infections.     Medical History: Past Medical History  Diagnosis Date  . Diabetes mellitus with diabetic polyneuropathy   . A-fib   . Renal disorder   . Hypertension   . Anemia   . Glaucoma     right eye   . Carotid artery stenosis   . PVD (peripheral vascular disease)     Assessment: 81 year old to begin po Levaquin for a UTI CrCl = 20 ml/min  Plan:  Levaquin 250 mg po daily Pharmacy will sign off and follow as needed for renal changes  Thank you. Okey RegalLisa Aristea Posada, PharmD [phone]  11/25/2014,12:52 PM

## 2014-11-26 DIAGNOSIS — R001 Bradycardia, unspecified: Secondary | ICD-10-CM

## 2014-11-26 DIAGNOSIS — R7989 Other specified abnormal findings of blood chemistry: Secondary | ICD-10-CM

## 2014-11-26 DIAGNOSIS — G9340 Encephalopathy, unspecified: Secondary | ICD-10-CM

## 2014-11-26 DIAGNOSIS — I480 Paroxysmal atrial fibrillation: Secondary | ICD-10-CM

## 2014-11-26 LAB — COMPREHENSIVE METABOLIC PANEL
ALBUMIN: 2.4 g/dL — AB (ref 3.5–5.2)
ALK PHOS: 158 U/L — AB (ref 39–117)
ALT: 172 U/L — AB (ref 0–35)
ANION GAP: 9 (ref 5–15)
AST: 99 U/L — AB (ref 0–37)
BUN: 38 mg/dL — ABNORMAL HIGH (ref 6–23)
CALCIUM: 8.3 mg/dL — AB (ref 8.4–10.5)
CHLORIDE: 111 meq/L (ref 96–112)
CO2: 20 mmol/L (ref 19–32)
CREATININE: 1.07 mg/dL (ref 0.50–1.10)
GFR calc Af Amer: 54 mL/min — ABNORMAL LOW (ref >90)
GFR calc non Af Amer: 46 mL/min — ABNORMAL LOW (ref >90)
GLUCOSE: 90 mg/dL (ref 70–99)
Potassium: 4.1 mmol/L (ref 3.5–5.1)
Sodium: 140 mmol/L (ref 135–145)
Total Bilirubin: 0.6 mg/dL (ref 0.3–1.2)
Total Protein: 5.9 g/dL — ABNORMAL LOW (ref 6.0–8.3)

## 2014-11-26 LAB — BASIC METABOLIC PANEL
ANION GAP: 9 (ref 5–15)
BUN: 44 mg/dL — ABNORMAL HIGH (ref 6–23)
CHLORIDE: 109 meq/L (ref 96–112)
CO2: 22 mmol/L (ref 19–32)
Calcium: 8.4 mg/dL (ref 8.4–10.5)
Creatinine, Ser: 1.2 mg/dL — ABNORMAL HIGH (ref 0.50–1.10)
GFR calc Af Amer: 47 mL/min — ABNORMAL LOW (ref >90)
GFR calc non Af Amer: 40 mL/min — ABNORMAL LOW (ref >90)
GLUCOSE: 105 mg/dL — AB (ref 70–99)
POTASSIUM: 4.1 mmol/L (ref 3.5–5.1)
Sodium: 140 mmol/L (ref 135–145)

## 2014-11-26 LAB — GLUCOSE, CAPILLARY
GLUCOSE-CAPILLARY: 186 mg/dL — AB (ref 70–99)
GLUCOSE-CAPILLARY: 98 mg/dL (ref 70–99)
Glucose-Capillary: 78 mg/dL (ref 70–99)
Glucose-Capillary: 87 mg/dL (ref 70–99)

## 2014-11-26 MED ORDER — FUROSEMIDE 20 MG PO TABS
20.0000 mg | ORAL_TABLET | Freq: Every day | ORAL | Status: DC
  Administered 2014-11-26 – 2014-11-29 (×4): 20 mg via ORAL
  Filled 2014-11-26 (×4): qty 1

## 2014-11-26 MED ORDER — FAMOTIDINE 40 MG/5ML PO SUSR
20.0000 mg | Freq: Every day | ORAL | Status: DC
  Administered 2014-11-26 – 2014-12-01 (×6): 20 mg
  Filled 2014-11-26 (×6): qty 2.5

## 2014-11-26 MED ORDER — METOPROLOL TARTRATE 25 MG/10 ML ORAL SUSPENSION
50.0000 mg | Freq: Two times a day (BID) | ORAL | Status: DC
  Administered 2014-11-26 – 2014-11-27 (×2): 50 mg
  Filled 2014-11-26 (×3): qty 20

## 2014-11-26 NOTE — Clinical Social Work Note (Addendum)
No family at bedside, CSW has left messages with all three numbers listed in Epic in attempts to complete assessment of patient. CSW waiting for call back from family. [facility] confirms patient was at their facility, but family is NOT holding a bed for the patient.    Roddie McBryant Linzi Ohlinger MSW, [place]WA, [place], [phone]

## 2014-11-26 NOTE — Evaluation (Signed)
Occupational Therapy Evaluation Patient Details Name: Susan Calhoun MRN: [id number] DOB: 01/07/1928 Today's Date: 11/26/2014    History of Present Illness Susan SoGiuseppina Hochman is an 81 y.o. Female SNF resident with PMH of a fib, DM, dysphagia on PEG feeding, hx of dementia and recent hemorrhagic stroke. Pt admitted from SNF due to fever and decreased mentation and found to have UTI with renal insufficiency.    Clinical Impression   [facility] and per PT note, pt was grossly bed bound with lift transfer to chair. Pt currently requires total (A) for ADLs and would benefit from continued SNF at d/c. OT will defer to SNF at this time.     Follow Up Recommendations  SNF;Supervision/Assistance - 24 hour    Equipment Recommendations  Other (comment) (defer to SNF)    Recommendations for Other Services       Precautions / Restrictions Precautions Precautions: Fall Restrictions Weight Bearing Restrictions: No      Mobility Bed Mobility     General bed mobility comments: Pt sitting up in recliner when OT arrived.   Transfers Overall transfer level: Needs assistance Equipment used: None Transfers: Sit to/from Stand Sit to Stand: Total assist;+2 physical assistance   Squat pivot transfers: Total assist     General transfer comment: Total (A) to power up and blocking of knees.     Balance Overall balance assessment: Needs assistance Sitting-balance support: Feet supported;Single extremity supported;Bilateral upper extremity supported Sitting balance-Leahy Scale: Poor Sitting balance - Comments: sat EOB >15 minutes working on sitting tolerance, balance, finding and coming ito midline,  attempting w/shifting and scooting.mor to EOB     Standing balance-Leahy Scale: Zero Standing balance comment: sit to stand x2 for pericare and blocked legs, but no discernable pt effort with LE's                            ADL Overall ADL's :  Needs assistance/impaired                                       General ADL Comments: Pt requires total (A) at this time for ADLs.      Vision                 Additional Comments: Pt unable to report.    Perception Perception Perception Tested?: No   Praxis Praxis Praxis tested?: Not tested    Pertinent Vitals/Pain Pain Assessment: 0-10 Pain Score:  ("hurts") Faces Pain Scale: Hurts a little bit Pain Location: stomach Pain Intervention(s): Monitored during session        Extremity/Trunk Assessment Upper Extremity Assessment Upper Extremity Assessment: RUE deficits/detail;LUE deficits/detail;Generalized weakness RUE Deficits / Details: Pt moves RUE more, however has limited ROM and unable to reach face to itch. Pt likely needs assist to feed at SNF, however per chart is mostly dependent on PEG for nutrition. RUE with tremor throughout.  Weakness throughout (2+/5) RUE Coordination: decreased fine motor;decreased gross motor LUE Deficits / Details: Pt uses LUE less than RUE and tends to ignore it. Is able to move on command, however weakness limits ROM  and demonstrates tremor in LUE throughout.  LUE Coordination: decreased fine motor;decreased gross motor   Lower Extremity Assessment Lower Extremity Assessment: Defer to PT evaluation       Communication Communication Communication: Expressive difficulties  Cognition Arousal/Alertness: Awake/alert Behavior During Therapy: Anxious;WFL for tasks assessed/performed Overall Cognitive Status: No family/caregiver present to determine baseline cognitive functioning (hx of cognitive impairments) Area of Impairment: Orientation;Attention;Memory;Following commands;Safety/judgement;Awareness;Problem solving Orientation Level: Disoriented to;Place;Situation Current Attention Level: Focused Memory: Decreased recall of precautions;Decreased short-term memory Following Commands: Follows one step commands with  increased time Safety/Judgement: Decreased awareness of safety;Decreased awareness of deficits Awareness: Intellectual Problem Solving: Slow processing;Requires verbal cues;Requires tactile cues                Home Living Family/patient expects to be discharged to:: Skilled nursing facility                                 Additional Comments: Pt from SNF and per PT note, has been mostly bed bound with lift to chair in SNF.       Prior Functioning/Environment Level of Independence: Needs assistance  Gait / Transfers Assistance Needed: Per PT, has not been ambulatory at SNF ADL's / Homemaking Assistance Needed: Per PT: pt requires (A) with bathing and toileting.         OT Diagnosis: Generalized weakness;Cognitive deficits    End of Session Equipment Utilized During Treatment: Gait belt  Activity Tolerance: Patient tolerated treatment well Patient left: in chair;with call bell/phone within reach;with chair alarm set   Time: 1424-1443 OT Time Calculation (min): 19 min Charges:  OT General Charges $OT Visit: 1 Procedure OT Evaluation $Initial OT Evaluation Tier I: 1 Procedure OT Treatments $Self Care/Home Management : 8-22 mins G-Codes:    Nena Jordan M 12-14-14, 3:14 PM  Carney Living, OTR/L Occupational Therapist [phone] (pager)

## 2014-11-26 NOTE — Progress Notes (Signed)
Subjective: No overnight events. Head CT completed, imaging reviewed. No acute process noted.   Objective: Current vital signs: BP 156/115 mmHg  Pulse 52  Temp(Src) 98.4 F (36.9 C) (Oral)  Resp 19  Ht 4\' 11"  (1.499 m)  Wt 58.968 kg (130 lb)  BMI 26.24 kg/m2  SpO2 100% Vital signs in last 24 hours: Temp:  [98.3 F (36.8 C)-98.5 F (36.9 C)] 98.4 F (36.9 C) (01/06 0459) Pulse Rate:  [45-52] 52 (01/06 0459) Resp:  [16-20] 19 (01/06 0459) BP: (150-156)/(98-115) 156/115 mmHg (01/06 0459) SpO2:  [96 %-100 %] 100 % (01/06 0459) Weight:  [58.968 kg (130 lb)] 58.968 kg (130 lb) (01/06 0500)  Intake/Output from previous day: 01/05 0701 - 01/06 0700 In: 1987 [I.V.:1150; NG/GT:300] Out: 0  Intake/Output this shift:   Nutritional status: DIET - DYS 1  Neurologic Exam: Neurologic Examination Mental Status: Eyes open, makes eye contact and tracks examiner. Intermittently will tell me her first name and follow simple commands.  Cranial Nerves: II:  pupils equal, round, reactive to light  III,IV, VI: ptosis not present, extra-ocular motions intact bilaterally V,VII: smile symmetric, facial light touch sensation normal bilaterally VIII: hearing normal bilaterally Motor: Mild generalized tremulousness. Due to mental status unable to formally test but appears to move all extremities symmetrically Sensory: light touch intact throughout, bilaterally Deep Tendon Reflexes: 2+ and symmetric throughout Plantars: Right: downgoingLeft: downgoing  Lab Results: Basic Metabolic Panel:  Recent Labs Lab 11/24/14 2136 11/26/14 0020 11/26/14 0627  NA 140 140 140  K 4.3 4.1 4.1  CL 108 109 111  CO2 20 22 20   GLUCOSE 144* 105* 90  BUN 65* 44* 38*  CREATININE 1.56* 1.20* 1.07  CALCIUM 9.2 8.4 8.3*    Liver Function Tests:  Recent Labs Lab 11/24/14 2136 11/26/14 0627  AST 152* 99*  ALT 233* 172*  ALKPHOS 211* 158*  BILITOT 0.5 0.6  PROT 6.7  5.9*  ALBUMIN 2.8* 2.4*   No results for input(s): LIPASE, AMYLASE in the last 168 hours.  Recent Labs Lab 11/25/14 1516  AMMONIA 25    CBC:  Recent Labs Lab 11/24/14 2136  WBC 9.1  NEUTROABS 5.4  HGB 10.9*  HCT 34.0*  MCV 94.7  PLT 309    Cardiac Enzymes: No results for input(s): CKTOTAL, CKMB, CKMBINDEX, TROPONINI in the last 168 hours.  Lipid Panel: No results for input(s): CHOL, TRIG, HDL, CHOLHDL, VLDL, LDLCALC in the last 168 hours.  CBG:  Recent Labs Lab 11/25/14 0811 11/25/14 1303 11/25/14 1710 11/25/14 2200 11/26/14 0749  GLUCAP 102* 165* 153* 80 78    Microbiology: Results for orders placed or performed during the hospital encounter of 11/24/14  Urine culture     Status: None (Preliminary result)   Collection Time: 11/24/14 10:07 PM  Result Value Ref Range Status   Specimen Description URINE, CATHETERIZED  Final   Special Requests Normal  Final   Colony Count PENDING  Incomplete   Culture   Final    Culture reincubated for better growth Performed at [facility]    Report Status PENDING  Incomplete  MRSA PCR Screening     Status: None   Collection Time: 11/25/14  2:00 AM  Result Value Ref Range Status   MRSA by PCR NEGATIVE NEGATIVE Final    Comment:        The GeneXpert MRSA Assay (FDA approved for NASAL specimens only), is one component of a comprehensive MRSA colonization surveillance program. It is not intended to  diagnose MRSA infection nor to guide or monitor treatment for MRSA infections.     Coagulation Studies: No results for input(s): LABPROT, INR in the last 72 hours.  Imaging: Dg Chest 1 View  11/24/2014   CLINICAL DATA:  Shortness of breath.  Urinary tract infection.  EXAM: CHEST - 1 VIEW  COMPARISON:  10/08/2014  FINDINGS: Patient is post median sternotomy. The cardiomediastinal contours are unchanged. Heart is at the upper limits of normal in size. No pulmonary edema. No consolidation to suggest pneumonia. No  large pleural effusion. No pneumothorax. Chronic change of both shoulders again seen.  IMPRESSION: No acute pulmonary process.   Electronically Signed   By: Rubye Oaks M.D.   On: 11/24/2014 23:11   Dg Abd 1 View  11/24/2014   CLINICAL DATA:  Complications of feeding tube.  EXAM: ABDOMEN - 1 VIEW  COMPARISON:  10/18/2014  FINDINGS: Gastrostomy tube tip and balloon the are localized to the left upper quadrant, consistent with location in the stomach. Positioning is similar to previous study. Normal bowel gas pattern. Degenerative changes in the spine and hips. Calcifications in the pelvis consistent with phleboliths.  IMPRESSION: Gastrostomy tube projects over the stomach.   Electronically Signed   By: Burman Nieves M.D.   On: 11/24/2014 23:07   Ct Head Wo Contrast  11/25/2014   CLINICAL DATA:  Acute onset of altered mental status. Initial encounter.  EXAM: CT HEAD WITHOUT CONTRAST  TECHNIQUE: Contiguous axial images were obtained from the base of the skull through the vertex without intravenous contrast.  COMPARISON:  CT of the head performed 10/19/2014  FINDINGS: There is no evidence of acute infarction, mass lesion, or intra- or extra-axial hemorrhage on CT.  A small focus of calcification at the right parietal lobe is stable from prior studies. Prominence of the ventricles suggests mild cortical volume loss, stable from prior studies; this is slightly out of proportion to sulcal prominence, without definite hydrocephalus. Scattered periventricular white matter change likely reflects small vessel ischemic microangiopathy.  The brainstem and fourth ventricle are within normal limits. The basal ganglia are unremarkable in appearance. The cerebral hemispheres demonstrate grossly normal gray-white differentiation. No mass effect or midline shift is seen.  There is no evidence of fracture; visualized osseous structures are unremarkable in appearance. The orbits are within normal limits. The paranasal  sinuses and mastoid air cells are well-aerated. No significant soft tissue abnormalities are seen.  IMPRESSION: 1. No acute intracranial pathology seen on CT. 2. Mild ventriculomegaly again noted, slightly out of proportion to sulcal prominence but stable from prior studies. This is thought to reflect mild cortical volume loss. 3. Small vessel ischemic microangiopathy noted.   Electronically Signed   By: Roanna Raider M.D.   On: 11/25/2014 20:44   US Abdomen Limited Ruq  11/25/2014   CLINICAL DATA:  Elevated liver enzymes.  EXAM: US ABDOMEN LIMITED - RIGHT UPPER QUADRANT  COMPARISON:  None.  FINDINGS: Gallbladder:  Minimal layering material in the gallbladder consistent with sludge. No stones. No gallbladder wall thickening or edema. Murphy's sign is negative.  Common bile duct:  Diameter: 5.2 mm, normal  Liver:  No focal lesion identified. Within normal limits in parenchymal echogenicity.  IMPRESSION: Minimal sludge in the gallbladder. No stones or inflammatory changes identified.   Electronically Signed   By: Burman Nieves M.D.   On: 11/25/2014 21:06    Medications:  Scheduled: . allopurinol  100 mg Oral BID  . amantadine  100 mg Oral Daily  .  amiodarone  200 mg Per Tube Daily  . antiseptic oral rinse  7 mL Mouth Rinse q12n4p  . aspirin  325 mg Per Tube Daily  . chlorhexidine  15 mL Mouth Rinse BID  . cholecalciferol  2,000 Units Oral Daily  . cloNIDine  0.1 mg Per Tube BID  . diltiazem  30 mg Oral 4 times per day  . feeding supplement (ENSURE COMPLETE)  237 mL Oral TID WC  . feeding supplement (VITAL AF 1.2 CAL)  280 mL Per Tube QID  . ferrous sulfate  325 mg Oral Q breakfast  . free water  300 mL Per Tube Q4H  . insulin aspart  0-5 Units Subcutaneous QHS  . insulin aspart  0-9 Units Subcutaneous TID WC  . latanoprost  1 drop Right Eye QHS  . levofloxacin  250 mg Oral Daily  . metoprolol tartrate  100 mg Per Tube BID  . pantoprazole  40 mg Oral Daily  . pantoprazole sodium  40 mg Per  Tube Daily  . vitamin B-12  1,000 mcg Oral Daily    Assessment/Plan:  81y/o woman with hx of recent IVH, baseline dementia, DM, A fib presenting with fever and altered mental status. Found to have a UTI in the ED. Suspect her encephalopathy is likely related to her UTI exacerbating her underlying dementia. Polypharmacy is also potentially playing a role in her encephalopathy. CT head is stable.   -continue UTI treatment per primary team -metabolic corrections per primary team. BUN and Cr improving -would hold on EEG at this time -no further neurological workup indicated at this time   LOS: 2 days   Elspeth Cho, DO [facility] [phone]  If 7pm- 7am, please page neurology on call as listed in AMION. 11/26/2014  9:07 AM

## 2014-11-26 NOTE — Progress Notes (Signed)
PROGRESS NOTE  Susan Calhoun JKK:[id number] DOB: 1928-11-05 DOA: 11/24/2014 PCP: Pcp Not In System  HPI/ Susan Calhoun is a 81 yo female coming in from SNF with a UTI and altered mentation.  Hx of hemorrhagic stroke on 09/01/14.  Granddaughter reports days of alertness mixed with days of confusion, hallucinations, and mixed up speech post stroke.  Had a UTI in November and presented the same way - lack of speech and inability to follow commands.  Chronic tremors.    Subjective: Patient denies pain.  Has mixed appropriate phrases with gibberish in response to my questions.  Assessment/Plan:  UTI UTI found in ED based on UA and microscopic (11/24/13) - many bacteria present, increased WBC Levofloxacin for hx of klebsiella resistant to cephalosporin Cultures have been re-incubated for better growth.  Altered mental status post hemorrhagic stroke Hemorrhagic stroke 09/01/14, possibly exacerbated by UTI.  Mental status slightly improved on 1/6. CT scan without acute abnormality. Appreciate neuro consult.  Per neuro, no further work up indicated at this time.  Acute renal failure Previous creatinine in 09/2014 was normal. Possibly due dehydration and UTI Held lasix and increased IVF for 12 hours. 1/6 creatinine has normalized.  Will restart home dose of lasix.  Bradycardia Pulse rate in the 40s - 50s.  Appears to be acute this hospitalization.   Patient is on Amiodarone and Diltiazem.   Will ask Cards to consult.  Elevated LFTS Transaminases and Alk phos were elevated. Bili is normal.  On 1/6 these are now improving. Uncertain etiology. Patient was on Amiodarone, Reglan and a statin. Per family she has been on the lipitor and the amio for a long time (years) but the reglan is new.  Held reglan, and lipitor.  Discussed this issue with pharmacy who also recommended changing the protonix to pepcid.  Have asked Cardiology to evaluate regarding the amio.  Tremors Unknown etiology,  chronic Ativan PRN at nursing home for tremors. Will continue. Holding Reglan which can cause tardive dyskinesia.  DVT Prophylaxis:  heparin  Code Status: DNR Family Communication: Susan Calhoun - left voice mail. Disposition Plan: inpatient   Consultants:  Neuro  Procedures:  CT head without contrast  Antibiotics:  Levofloxacin per pharmacy consult  Objective: Filed Vitals:   11/25/14 2159 11/26/14 0459 11/26/14 0500 11/26/14 1339  BP: 150/113 156/115  157/66  Pulse: 45 52  51  Temp: 98.5 F (36.9 C) 98.4 F (36.9 C)  98.5 F (36.9 C)  TempSrc: Oral Oral  Oral  Resp: _0 Height:      Weight:   58.968 kg (130 lb)   SpO2: 96% 100%  98%    Intake/Output Summary (Last 24 hours) at 11/26/14 1420 Last data filed at 11/26/14 1300  Gross per 24 hour  Intake   2200 ml  Output      0 ml  Net   2200 ml   Filed Weights   11/25/14 0155 11/26/14 0500  Weight: 55.112 kg (121 lb 8 oz) 58.968 kg (130 lb)   Exam: General: Well developed, NAD, appears stated age, laying in bed  HEENT: Anicteic Sclera, MMM.  Neck: Supple, no JVD, no masses, proximal end of right clavicle slightly bruised and tender to palpation  Cardiovascular: S1 S2 auscultated, no murmurs heard  Respiratory: Clear to auscultation bilaterally Psych: pleasant, speaking gibberish mixed with some intelligible phrases.  Data Reviewed: Basic Metabolic Panel:  Recent Labs Lab 11/24/14 2136 11/26/14 0020 11/26/14 0627  NA 140 140 140  K 4.3 4.1 4.1  CL 108 109 111  CO2 _0 GLUCOSE 144* 105* 90  BUN 65* 44* 38*  CREATININE 1.56* 1.20* 1.07  CALCIUM 9.2 8.4 8.3*   Liver Function Tests:  Recent Labs Lab 11/24/14 2136 11/26/14 0627  AST 152* 99*  ALT 233* 172*  ALKPHOS 211* 158*  BILITOT 0.5 0.6  PROT 6.7 5.9*  ALBUMIN 2.8* 2.4*   CBC:  Recent Labs Lab 11/24/14 2136  WBC 9.1  NEUTROABS 5.4  HGB 10.9*  HCT 34.0*  MCV 94.7  PLT 309   BNP (last 3 results)  Recent  Labs  09/16/14 1730  PROBNP 9594.0*   CBG:  Recent Labs Lab 11/25/14 1303 11/25/14 1710 11/25/14 2200 11/26/14 0749 11/26/14 1206  GLUCAP 165* 153* 80 78 87    Recent Results (from the past 240 hour(s))  Urine culture     Status: None (Preliminary result)   Collection Time: 11/24/14 10:07 PM  Result Value Ref Range Status   Specimen Description URINE, CATHETERIZED  Final   Special Requests Normal  Final   Colony Count PENDING  Incomplete   Culture   Final    Culture reincubated for better growth Performed at [facility]    Report Status PENDING  Incomplete  MRSA PCR Screening     Status: None   Collection Time: 11/25/14  2:00 AM  Result Value Ref Range Status   MRSA by PCR NEGATIVE NEGATIVE Final    Comment:        The GeneXpert MRSA Assay (FDA approved for NASAL specimens only), is one component of a comprehensive MRSA colonization surveillance program. It is not intended to diagnose MRSA infection nor to guide or monitor treatment for MRSA infections.      Studies: Dg Chest 1 View  11/24/2014   CLINICAL DATA:  Shortness of breath.  Urinary tract infection.  EXAM: CHEST - 1 VIEW  COMPARISON:  10/08/2014  FINDINGS: Patient is post median sternotomy. The cardiomediastinal contours are unchanged. Heart is at the upper limits of normal in size. No pulmonary edema. No consolidation to suggest pneumonia. No large pleural effusion. No pneumothorax. Chronic change of both shoulders again seen.  IMPRESSION: No acute pulmonary process.   Electronically Signed   By: Jeb Levering M.D.   On: 11/24/2014 23:11   Dg Abd 1 View  11/24/2014   CLINICAL DATA:  Complications of feeding tube.  EXAM: ABDOMEN - 1 VIEW  COMPARISON:  10/18/2014  FINDINGS: Gastrostomy tube tip and balloon the are localized to the left upper quadrant, consistent with location in the stomach. Positioning is similar to previous study. Normal bowel gas pattern. Degenerative changes in the spine and  hips. Calcifications in the pelvis consistent with phleboliths.  IMPRESSION: Gastrostomy tube projects over the stomach.   Electronically Signed   By: Lucienne Capers M.D.   On: 11/24/2014 23:07   Ct Head Wo Contrast  11/25/2014   CLINICAL DATA:  Acute onset of altered mental status. Initial encounter.  EXAM: CT HEAD WITHOUT CONTRAST  TECHNIQUE: Contiguous axial images were obtained from the base of the skull through the vertex without intravenous contrast.  COMPARISON:  CT of the head performed 10/19/2014  FINDINGS: There is no evidence of acute infarction, mass lesion, or intra- or extra-axial hemorrhage on CT.  A small focus of calcification at the right parietal lobe is stable from prior studies. Prominence of the ventricles suggests mild cortical volume loss, stable from prior studies; this  is slightly out of proportion to sulcal prominence, without definite hydrocephalus. Scattered periventricular white matter change likely reflects small vessel ischemic microangiopathy.  The brainstem and fourth ventricle are within normal limits. The basal ganglia are unremarkable in appearance. The cerebral hemispheres demonstrate grossly normal gray-white differentiation. No mass effect or midline shift is seen.  There is no evidence of fracture; visualized osseous structures are unremarkable in appearance. The orbits are within normal limits. The paranasal sinuses and mastoid air cells are well-aerated. No significant soft tissue abnormalities are seen.  IMPRESSION: 1. No acute intracranial pathology seen on CT. 2. Mild ventriculomegaly again noted, slightly out of proportion to sulcal prominence but stable from prior studies. This is thought to reflect mild cortical volume loss. 3. Small vessel ischemic microangiopathy noted.   Electronically Signed   By: Garald Balding M.D.   On: 11/25/2014 20:44   US Abdomen Limited Ruq  11/25/2014   CLINICAL DATA:  Elevated liver enzymes.  EXAM: US ABDOMEN LIMITED - RIGHT UPPER  QUADRANT  COMPARISON:  None.  FINDINGS: Gallbladder:  Minimal layering material in the gallbladder consistent with sludge. No stones. No gallbladder wall thickening or edema. Murphy's sign is negative.  Common bile duct:  Diameter: 5.2 mm, normal  Liver:  No focal lesion identified. Within normal limits in parenchymal echogenicity.  IMPRESSION: Minimal sludge in the gallbladder. No stones or inflammatory changes identified.   Electronically Signed   By: Lucienne Capers M.D.   On: 11/25/2014 21:06    Scheduled Meds: . allopurinol  100 mg Oral BID  . amantadine  100 mg Oral Daily  . amiodarone  200 mg Per Tube Daily  . antiseptic oral rinse  7 mL Mouth Rinse q12n4p  . aspirin  325 mg Per Tube Daily  . chlorhexidine  15 mL Mouth Rinse BID  . cholecalciferol  2,000 Units Oral Daily  . cloNIDine  0.1 mg Per Tube BID  . diltiazem  30 mg Oral 4 times per day  . famotidine  20 mg Per Tube Daily  . feeding supplement (ENSURE COMPLETE)  237 mL Oral TID WC  . feeding supplement (VITAL AF 1.2 CAL)  280 mL Per Tube QID  . ferrous sulfate  325 mg Oral Q breakfast  . free water  300 mL Per Tube Q4H  . furosemide  20 mg Oral Daily  . insulin aspart  0-5 Units Subcutaneous QHS  . insulin aspart  0-9 Units Subcutaneous TID WC  . latanoprost  1 drop Right Eye QHS  . levofloxacin  250 mg Oral Daily  . metoprolol tartrate  100 mg Per Tube BID  . vitamin B-12  1,000 mcg Oral Daily   Continuous Infusions: . sodium chloride 100 mL/hr at 11/26/14 0940    Principal Problem:   Acute encephalopathy Active Problems:   UTI (lower urinary tract infection)   DNR (do not resuscitate) discussion   Urinary tract infection   Elevated LFTs   Acute renal failure   Acute cystitis without hematuria   AKI (acute kidney injury)  Imogene Burn, PA-C [facility] Pager [phone]. If 7PM-7AM, please contact night-coverage at [email or web address], password [facility] 11/26/2014, 2:20 PM  LOS: 2 days    I have seen and  evaluated this patient and agree with the PA notes. Patient is poor historian due to dementia, does not seem in distress, sitting in chair, granddaughter at bedside reported patient has intermittent" visual hallucination", "respond to internal stimuli" since October 2015, improved with seroquel. None of the  symptoms mentioned by granddaughter was observed or documented during this admission. Granddaughter requested multiple subspecialty consult including neurology/psych consult. Will address this in am rounds.

## 2014-11-26 NOTE — Consult Note (Signed)
CARDIOLOGY CONSULT NOTE   Patient ID: Susan Calhoun MRN: [id number], DOB/AGE: [birth year]   Admit date: 11/24/2014 Date of Consult: 11/26/2014   Primary Physician: Pcp Not In System Primary Cardiologist: Seen by Dr. Katrinka Blazing as consult in Oct 2015  Pt. Profile  81 year old female with history of hypertension, diabetes, dysphagia on PEG tube feeding, dementia, history of CAD status post CABG, history of PVD status post carotid endarterectomy, and recent hemorrhagic stroke in October 2015 present with AMS and found to have UTI. Cardiology consulted on elevated transaminase and bradycardia.  Problem List  Past Medical History  Diagnosis Date  . Diabetes mellitus with diabetic polyneuropathy   . A-fib   . Renal disorder   . Hypertension   . Anemia   . Glaucoma     right eye   . Carotid artery stenosis   . PVD (peripheral vascular disease)     Past Surgical History  Procedure Laterality Date  . Replacement total knee Right   . Carotid endarterectomy  2015  . Coronary artery bypass graft    . Ablation      cardiac for arrthymia      Allergies  Allergies  Allergen Reactions  . Septra [Sulfamethoxazole-Trimethoprim] Hives    HPI   The patient is a 81 year old female with history of hypertension, diabetes, dysphagia on PEG tube feeding, dementia, history of CAD status post CABG, history of PVD status post carotid endarterectomy, and recent hemorrhagic stroke in October 2015. Of note, patient is a very poor historian and currently has tremor, she can only speak a few words at a time and unable to tell me majority of her history. Majority of the history of present illness has been obtained from previous medical record.  The last time patient was seen by cardiology was in October 2015 when she came in with altered mental status. MRI at that time revealed acute hydrocephalus was concern for meningitis and partially hemorrhagic in appearance. Cardiology was consulted for nonsustained  VT at the time which felt was actually atrial fibrillation with aberrancy. According to previous consult note, patient has been on 200 mg of amiodarone since at least 09/05/2014, however she was not loaded on it. Her Lopressor was increased in October admission and she's also on short-acting diltiazem as well. Based on her EKG, it appears she was in normal sinus rhythm at some point in October. However since that time most EKG shows she is being in atrial fibrillation. She was sent from skilled nursing facility for altered mental status on 11/24/2014. She was also found to have a UTI. On arrival to the floor, she should also had a tremor. Neurology was consulted and felt to altered mental status was likely result of UTI on top of her baseline dementia, however polypharmacy may also play a role in her current condition. On arrival, she also noted to have elevated transaminase with AST 233 and ALT 211. Her statin has been held, on repeat lab AST has improved to 172 and ALT 158. Overnight, her heart rate has been in the 50s to 60s and occasionally dip down to the 40s. Cardiology has been consulted for bradycardia and elevated transaminase in the setting of amiodarone. Of note she is also on 100 mg twice a day of metoprolol and every 6 hours of diltiazem for rate control.  Inpatient Medications  . allopurinol  100 mg Oral BID  . amantadine  100 mg Oral Daily  . amiodarone  200 mg Per Tube Daily  .  antiseptic oral rinse  7 mL Mouth Rinse q12n4p  . aspirin  325 mg Per Tube Daily  . chlorhexidine  15 mL Mouth Rinse BID  . cholecalciferol  2,000 Units Oral Daily  . cloNIDine  0.1 mg Per Tube BID  . diltiazem  30 mg Oral 4 times per day  . famotidine  20 mg Per Tube Daily  . feeding supplement (ENSURE COMPLETE)  237 mL Oral TID WC  . feeding supplement (VITAL AF 1.2 CAL)  280 mL Per Tube QID  . ferrous sulfate  325 mg Oral Q breakfast  . free water  300 mL Per Tube Q4H  . furosemide  20 mg Oral Daily  .  insulin aspart  0-5 Units Subcutaneous QHS  . insulin aspart  0-9 Units Subcutaneous TID WC  . latanoprost  1 drop Right Eye QHS  . levofloxacin  250 mg Oral Daily  . metoprolol tartrate  100 mg Per Tube BID  . vitamin B-12  1,000 mcg Oral Daily    Family History History reviewed. No pertinent family history.  Due to her mental status, family history was not able to be obtained.  Social History History   Social History  . Marital Status: Single    Spouse Name: N/A    Number of Children: N/A  . Years of Education: N/A   Occupational History  . Not on file.   Social History Main Topics  . Smoking status: Never Smoker   . Smokeless tobacco: Not on file  . Alcohol Use: No  . Drug Use: No  . Sexual Activity: No   Other Topics Concern  . Not on file   Social History Narrative     Review of Systems  General:  No chills, fever.  Cardiovascular:  No chest pain, dyspnea on exertion Dermatological: No rash, lesions/masses Respiratory: No cough +dyspnea Urologic: No hematuria Abdominal:   No tenderness Neurologic:  +weakness and AMS  Due to mental status, patient was only able to give partial ROS All other systems reviewed and are otherwise negative except as noted above.  Physical Exam  Blood pressure 157/66, pulse 51, temperature 98.5 F (36.9 C), temperature source Oral, resp. rate 16, height 4\' 11"  (1.499 m), weight 130 lb (58.968 kg), SpO2 98 %.  General: Patient mumbles during interview, answers few words at a time, and diagrams, difficult to interpret what she says most of the time. Psych: Normal affect. Neuro: Alert and oriented X 3. Moves all extremities spontaneously. HEENT: Normal  Neck: Supple without bruits or JVD. Lungs:  Resp regular and unlabored. patient unable to sit up, anterior lung exam clear Heart: RRR no s3, s4, or murmurs.  Abdomen: Soft, non-tender, non-distended, BS + x 4. PEG tube noted in epigastric area Extremities: Right arm shaking  during conversation.  Labs  No results for input(s): CKTOTAL, CKMB, TROPONINI in the last 72 hours. Lab Results  Component Value Date   WBC 9.1 11/24/2014   HGB 10.9* 11/24/2014   HCT 34.0* 11/24/2014   MCV 94.7 11/24/2014   PLT 309 11/24/2014    Recent Labs Lab 11/26/14 0627  NA 140  K 4.1  CL 111  CO2 20  BUN 38*  CREATININE 1.07  CALCIUM 8.3*  PROT 5.9*  BILITOT 0.6  ALKPHOS 158*  ALT 172*  AST 99*  GLUCOSE 90   Lab Results  Component Value Date   CHOL 204* 09/02/2014   HDL 64 09/02/2014   LDLCALC 110* 09/02/2014   TRIG  149 09/02/2014   No results found for: DDIMER  Radiology/Studies  Dg Chest 1 View  11/24/2014   CLINICAL DATA:  Shortness of breath.  Urinary tract infection.  EXAM: CHEST - 1 VIEW  COMPARISON:  10/08/2014  FINDINGS: Patient is post median sternotomy. The cardiomediastinal contours are unchanged. Heart is at the upper limits of normal in size. No pulmonary edema. No consolidation to suggest pneumonia. No large pleural effusion. No pneumothorax. Chronic change of both shoulders again seen.  IMPRESSION: No acute pulmonary process.   Electronically Signed   By: Rubye Oaks M.D.   On: 11/24/2014 23:11   Dg Abd 1 View  11/24/2014   CLINICAL DATA:  Complications of feeding tube.  EXAM: ABDOMEN - 1 VIEW  COMPARISON:  10/18/2014  FINDINGS: Gastrostomy tube tip and balloon the are localized to the left upper quadrant, consistent with location in the stomach. Positioning is similar to previous study. Normal bowel gas pattern. Degenerative changes in the spine and hips. Calcifications in the pelvis consistent with phleboliths.  IMPRESSION: Gastrostomy tube projects over the stomach.   Electronically Signed   By: Burman Nieves M.D.   On: 11/24/2014 23:07   Ct Head Wo Contrast  11/25/2014   CLINICAL DATA:  Acute onset of altered mental status. Initial encounter.  EXAM: CT HEAD WITHOUT CONTRAST  TECHNIQUE: Contiguous axial images were obtained from the base  of the skull through the vertex without intravenous contrast.  COMPARISON:  CT of the head performed 10/19/2014  FINDINGS: There is no evidence of acute infarction, mass lesion, or intra- or extra-axial hemorrhage on CT.  A small focus of calcification at the right parietal lobe is stable from prior studies. Prominence of the ventricles suggests mild cortical volume loss, stable from prior studies; this is slightly out of proportion to sulcal prominence, without definite hydrocephalus. Scattered periventricular white matter change likely reflects small vessel ischemic microangiopathy.  The brainstem and fourth ventricle are within normal limits. The basal ganglia are unremarkable in appearance. The cerebral hemispheres demonstrate grossly normal gray-white differentiation. No mass effect or midline shift is seen.  There is no evidence of fracture; visualized osseous structures are unremarkable in appearance. The orbits are within normal limits. The paranasal sinuses and mastoid air cells are well-aerated. No significant soft tissue abnormalities are seen.  IMPRESSION: 1. No acute intracranial pathology seen on CT. 2. Mild ventriculomegaly again noted, slightly out of proportion to sulcal prominence but stable from prior studies. This is thought to reflect mild cortical volume loss. 3. Small vessel ischemic microangiopathy noted.   Electronically Signed   By: Roanna Raider M.D.   On: 11/25/2014 20:44   US Abdomen Limited Ruq  11/25/2014   CLINICAL DATA:  Elevated liver enzymes.  EXAM: US ABDOMEN LIMITED - RIGHT UPPER QUADRANT  COMPARISON:  None.  FINDINGS: Gallbladder:  Minimal layering material in the gallbladder consistent with sludge. No stones. No gallbladder wall thickening or edema. Murphy's sign is negative.  Common bile duct:  Diameter: 5.2 mm, normal  Liver:  No focal lesion identified. Within normal limits in parenchymal echogenicity.  IMPRESSION: Minimal sludge in the gallbladder. No stones or  inflammatory changes identified.   Electronically Signed   By: Burman Nieves M.D.   On: 11/25/2014 21:06    ECG  none  ASSESSMENT AND PLAN  1. Bradycardia  - cut metoprolol to  BID from  BID, only received 1 dose since Yesterday morning due to bradycardia  2. Elevated LFT:   -  amiodarone started in Oct 2015, however was not loaded initially  - unclear why her LFTs were normal even a month ago and would be elevated during admission. Agree with holding statin and amiodarone  3. AMS related to UTI, dementia and polypharmacy  4. A-fib, not sure if she is still in a-fib or not  - did not see an EKG during this admission, will get one today. Also not on telemetry  - start telemetry after cutting back on BB and stopping amio to make sure she does not go into RVR  - CHA2DS2-Vasc score 7 (age, female, HTN, DM, stroke)  - not a candidate for anticoagulation given recent hemorrhagic stroke  5. Hypertension 6. DM  7. dysphagia on PEG tube feeding 8. Dementia 9. history of CAD status post CABG 10. history of PVD status post carotid endarterectomy 11.  recent hemorrhagic stroke in October 2015   Signed, Azalee Course, [place] 11/26/2014, 5:11 PM   I have personally seen and examined this patient with Azalee Course, PA-C. Pt with AMS, recent Intracranial event, dementia. Very difficult to communicate with her. She voices no complaints. Exam with RRR, clear bilaterally. She has known atrial fib on amio, metoprolol and Cardizem. Now LFTs elevated and she is bradycardic. Would hold amiodarone indefinetely. Would lower metoprolol dose to 50 mg po  BID. Continue Diltiazem at current dose. Agree with holding statin with elevated LFTs. I have reviewed the assessment and plan above and agree.    Avy Barlett 11/26/2014 5:45 PM

## 2014-11-26 NOTE — Evaluation (Signed)
Physical Therapy Evaluation Patient Details Name: Susan Calhoun MRN: [id number] DOB: 05/18/1928 Today's Date: 11/26/2014   History of Present Illness  Susan SoGiuseppina Gutmann  is a 81 y.o. female, nursing home resident with a past medical history significant for atrial fibrillation , diabetes mellitus and dysphagia on tube feeding [facility] for fever and decreased mentation;  patient has history of dementia and recent hemorrhagic stroke. There was concern about possible vaginal discharge as well , in the nursing home. In the emergency room the patient was found to have a urinary tract infection with renal insufficiency and I was asked to admit. The patient does not look toxic and had a temperature of 99 9.  Clinical Impression  Pt admitted with/for AMS, fever and UTI.  Pt currently limited functionally due to the problems listed. ( See problems list.)   Pt will benefit from PT to maximize function and safety in order to get ready for next venue listed below.     Follow Up Recommendations SNF    Equipment Recommendations  None recommended by PT;Other (comment) (TBA further at next venue)    Recommendations for Other Services       Precautions / Restrictions Precautions Precautions: Fall      Mobility  Bed Mobility Overal bed mobility: Needs Assistance Bed Mobility: Supine to Sit     Supine to sit: Max assist;HOB elevated     General bed mobility comments: VC's and significant assist to help her move to EOB and sit upright.  Transfers Overall transfer level: Needs assistance Equipment used: None Transfers: Sit to/from [employer] CorporationStand;Squat Pivot Transfers Sit to Stand: Total assist (x2 for pericare.)   Squat pivot transfers: Total assist     General transfer comment: cues and assist to guide pt through transfer against fears of falling.  Transfer and standing only accomplished by face to face assist with significant guarding of her  legs/knees.  Ambulation/Gait             General Gait Details: Not appropriate at this time.  Stairs            Wheelchair Mobility    Modified Rankin (Stroke Patients Only) Modified Rankin (Stroke Patients Only) Modified Rankin: Severe disability     Balance Overall balance assessment: Needs assistance Sitting-balance support: Feet supported;Single extremity supported;Bilateral upper extremity supported Sitting balance-Leahy Scale: Poor Sitting balance - Comments: sat EOB >15 minutes working on sitting tolerance, balance, finding and coming ito midline,  attempting w/shifting and scooting.mor to EOB     Standing balance-Leahy Scale: Zero Standing balance comment: sit to stand x2 for pericare and blocked legs, but no discernable pt effort with LE's                             Pertinent Vitals/Pain Pain Assessment:  (no noticeable pain)    Home Living Family/patient expects to be discharged to:: Skilled nursing facility                 Additional Comments: Before pt go "sick" several month ago, she was ambulatory ?without assistive device per daughter.  She has been mostly bed bound with lift to chair in [facility].  Per daughter has been having a hard time in PT.    Prior Function Level of Independence: Needs assistance   Gait / Transfers Assistance Needed: [facility]  ADL's / Homemaking Assistance Needed: assist with bathing and toileting  Hand Dominance        Extremity/Trunk Assessment   Upper Extremity Assessment: Defer to OT evaluation           Lower Extremity Assessment: Generalized weakness;Difficult to assess due to impaired cognition         Communication   Communication: Expressive difficulties  Cognition Arousal/Alertness: Lethargic (and then woke up once sitting EOB) Behavior During Therapy: Anxious;WFL for tasks assessed/performed Overall Cognitive Status: History  of cognitive impairments - at baseline       Memory: Decreased recall of precautions;Decreased short-term memory              General Comments      Exercises        Assessment/Plan    PT Assessment Patient needs continued PT services  PT Diagnosis Generalized weakness;Altered mental status   PT Problem List Decreased strength;Decreased range of motion;Decreased activity tolerance;Decreased balance;Decreased mobility;Decreased coordination;Decreased cognition  PT Treatment Interventions Functional mobility training;Therapeutic activities;Therapeutic exercise;Balance training;Patient/family education;Neuromuscular re-education   PT Goals (Current goals can be found in the Care Plan section) Acute Rehab PT Goals Patient Stated Goal: none stated PT Goal Formulation: With patient/family Time For Goal Achievement: 12/10/14 Potential to Achieve Goals: Fair    Frequency Min 2X/week   Barriers to discharge        Co-evaluation               End of Session Equipment Utilized During Treatment: Oxygen Activity Tolerance: Patient tolerated treatment well;Patient limited by fatigue Patient left: in chair;with call bell/phone within reach;with family/visitor present Nurse Communication: Mobility status;Need for lift equipment;Precautions         Time: 6045-4098 PT Time Calculation (min) (ACUTE ONLY): 33 min   Charges:   PT Evaluation $Initial PT Evaluation Tier I: 1 Procedure PT Treatments $Therapeutic Activity: 23-37 mins   PT G Codes:        Salle Brandle, Eliseo Gum 11/26/2014, 1:42 PM 11/26/2014  Carlisle Bing, PT [phone]  (pager)

## 2014-11-27 DIAGNOSIS — R001 Bradycardia, unspecified: Secondary | ICD-10-CM

## 2014-11-27 DIAGNOSIS — F29 Unspecified psychosis not due to a substance or known physiological condition: Secondary | ICD-10-CM

## 2014-11-27 DIAGNOSIS — F0391 Unspecified dementia with behavioral disturbance: Secondary | ICD-10-CM

## 2014-11-27 LAB — BASIC METABOLIC PANEL
ANION GAP: 8 (ref 5–15)
BUN: 32 mg/dL — ABNORMAL HIGH (ref 6–23)
CO2: 21 mmol/L (ref 19–32)
Calcium: 8.2 mg/dL — ABNORMAL LOW (ref 8.4–10.5)
Chloride: 110 mEq/L (ref 96–112)
Creatinine, Ser: 0.95 mg/dL (ref 0.50–1.10)
GFR calc Af Amer: 62 mL/min — ABNORMAL LOW (ref >90)
GFR, EST NON AFRICAN AMERICAN: 53 mL/min — AB (ref >90)
Glucose, Bld: 136 mg/dL — ABNORMAL HIGH (ref 70–99)
Potassium: 3.9 mmol/L (ref 3.5–5.1)
SODIUM: 139 mmol/L (ref 135–145)

## 2014-11-27 LAB — HEPATIC FUNCTION PANEL
ALK PHOS: 161 U/L — AB (ref 39–117)
ALT: 148 U/L — AB (ref 0–35)
AST: 81 U/L — ABNORMAL HIGH (ref 0–37)
Albumin: 2.4 g/dL — ABNORMAL LOW (ref 3.5–5.2)
BILIRUBIN DIRECT: 0.3 mg/dL (ref 0.0–0.3)
Indirect Bilirubin: 0.6 mg/dL (ref 0.3–0.9)
TOTAL PROTEIN: 5.8 g/dL — AB (ref 6.0–8.3)
Total Bilirubin: 0.9 mg/dL (ref 0.3–1.2)

## 2014-11-27 LAB — GLUCOSE, CAPILLARY
GLUCOSE-CAPILLARY: 117 mg/dL — AB (ref 70–99)
GLUCOSE-CAPILLARY: 177 mg/dL — AB (ref 70–99)
Glucose-Capillary: 120 mg/dL — ABNORMAL HIGH (ref 70–99)
Glucose-Capillary: 157 mg/dL — ABNORMAL HIGH (ref 70–99)

## 2014-11-27 MED ORDER — QUETIAPINE FUMARATE 25 MG PO TABS
25.0000 mg | ORAL_TABLET | Freq: Two times a day (BID) | ORAL | Status: DC
  Filled 2014-11-27: qty 1

## 2014-11-27 MED ORDER — QUETIAPINE FUMARATE 25 MG PO TABS
25.0000 mg | ORAL_TABLET | Freq: Two times a day (BID) | ORAL | Status: DC
  Filled 2014-11-27 (×2): qty 1

## 2014-11-27 NOTE — Consult Note (Signed)
[facility] Face-to-Face Psychiatry Consult   Reason for Consult:  Dementia with hullucinations Referring Physician:  Dr. Jenel Lucks is an 81 y.o. female. Total Time spent with patient: 45 minutes  Assessment: AXIS I:  Psychotic Disorder NOS and Dementia AXIS II:  Deferred AXIS III:   Past Medical History  Diagnosis Date  . Diabetes mellitus with diabetic polyneuropathy   . A-fib   . Renal disorder   . Hypertension   . Anemia   . Glaucoma     right eye   . Carotid artery stenosis   . PVD (peripheral vascular disease)    AXIS IV:  other psychosocial or environmental problems, problems related to social environment and problems with primary support group AXIS V:  51-60 moderate symptoms  Plan:  Case discussed with Dr. Erlinda Hong No evidence of imminent risk to self or others at present.   Patient does not meet criteria for psychiatric inpatient admission. Supportive therapy provided about ongoing stressors. Start Seroquel 25 mg PO BID for hallucinations  Subjective:   Shirelle Tootle is a 81 y.o. female patient admitted with AMS.  HPI:  Humna Moorehouse is a 81 y.o. Female seen for psych consultation for decreased mentation, visual hallucinations and chronic dementia. Patient is seen in her bed, oriented to her name only and has been a poor historian, spoke with Dr.Xu who stated that patient responded with seroquel in the past and currently receiving treatment for UTI. Patient states that she is in [place], [place] next to [place]. Patient granddaughter stated that she has been suffering with visual hallucinations for some time and trying to grab in the air. Patient has no psych admission in [place].   Medical history: She is a nursing home resident with a past medical history significant for atrial fibrillation , diabetes mellitus and dysphagia on tube feeding sent for evaluation for fever and decreased mentation patient has history of dementia and recent hemorrhagic stroke. There was  concern about possible vaginal discharge as well , in the nursing home. In the emergency room the patient was found to have a urinary tract infection with renal insufficiency and I was asked to admit. The patient does not look toxic and had a temperature of 99 9.  HPI Elements:   Location:  AMS and hallucinations. Quality:  visual hallucinations. Severity:  moderate. Timing:  UTI. Duration:  few days. Context:  unknown.  Past Psychiatric History: Past Medical History  Diagnosis Date  . Diabetes mellitus with diabetic polyneuropathy   . A-fib   . Renal disorder   . Hypertension   . Anemia   . Glaucoma     right eye   . Carotid artery stenosis   . PVD (peripheral vascular disease)     reports that she has never smoked. She does not have any smokeless tobacco history on file. She reports that she does not drink alcohol or use illicit drugs. History reviewed. No pertinent family history.       Abuse/Neglect [facility]) Physical Abuse: Denies Verbal Abuse: Denies Sexual Abuse: Denies Allergies:   Allergies  Allergen Reactions  . Septra [Sulfamethoxazole-Trimethoprim] Hives    ACT Assessment Complete:  NO Objective: Blood pressure 100/75, pulse 49, temperature 97.5 F (36.4 C), temperature source Oral, resp. rate 18, height 4' 11"  (1.499 m), weight 58.968 kg (130 lb), SpO2 97 %.Body mass index is 26.24 kg/(m^2). Results for orders placed or performed during the hospital encounter of 11/24/14 (from the past 72 hour(s))  Comprehensive metabolic panel  Status: Abnormal   Collection Time: 11/24/14  9:36 PM  Result Value Ref Range   Sodium 140 135 - 145 mmol/L    Comment: Please note change in reference range.   Potassium 4.3 3.5 - 5.1 mmol/L    Comment: Please note change in reference range.   Chloride 108 96 - 112 mEq/L   CO2 20 19 - 32 mmol/L   Glucose, Bld 144 (H) 70 - 99 mg/dL   BUN 65 (H) 6 - 23 mg/dL   Creatinine, Ser 1.56 (H) 0.50 - 1.10 mg/dL   Calcium 9.2 8.4 - 10.5  mg/dL   Total Protein 6.7 6.0 - 8.3 g/dL   Albumin 2.8 (L) 3.5 - 5.2 g/dL   AST 152 (H) 0 - 37 U/L   ALT 233 (H) 0 - 35 U/L   Alkaline Phosphatase 211 (H) 39 - 117 U/L   Total Bilirubin 0.5 0.3 - 1.2 mg/dL   GFR calc non Af Amer 29 (L) >90 mL/min   GFR calc Af Amer 34 (L) >90 mL/min    Comment: (NOTE) The eGFR has been calculated using the CKD EPI equation. This calculation has not been validated in all clinical situations. eGFR's persistently <90 mL/min signify possible Chronic Kidney Disease.    Anion gap 12 5 - 15  CBC with Differential     Status: Abnormal   Collection Time: 11/24/14  9:36 PM  Result Value Ref Range   WBC 9.1 4.0 - 10.5 K/uL   RBC 3.59 (L) 3.87 - 5.11 MIL/uL   Hemoglobin 10.9 (L) 12.0 - 15.0 g/dL   HCT 34.0 (L) 36.0 - 46.0 %   MCV 94.7 78.0 - 100.0 fL   MCH 30.4 26.0 - 34.0 pg   MCHC 32.1 30.0 - 36.0 g/dL   RDW 16.2 (H) 11.5 - 15.5 %   Platelets 309 150 - 400 K/uL   Neutrophils Relative % 59 43 - 77 %   Neutro Abs 5.4 1.7 - 7.7 K/uL   Lymphocytes Relative 29 12 - 46 %   Lymphs Abs 2.7 0.7 - 4.0 K/uL   Monocytes Relative 9 3 - 12 %   Monocytes Absolute 0.8 0.1 - 1.0 K/uL   Eosinophils Relative 2 0 - 5 %   Eosinophils Absolute 0.2 0.0 - 0.7 K/uL   Basophils Relative 1 0 - 1 %   Basophils Absolute 0.1 0.0 - 0.1 K/uL  I-Stat CG4 Lactic Acid, ED     Status: None   Collection Time: 11/24/14 10:01 PM  Result Value Ref Range   Lactic Acid, Venous 1.18 0.5 - 2.2 mmol/L  Urinalysis, Routine w reflex microscopic     Status: Abnormal   Collection Time: 11/24/14 10:07 PM  Result Value Ref Range   Color, Urine YELLOW YELLOW   APPearance CLOUDY (A) CLEAR   Specific Gravity, Urine 1.014 1.005 - 1.030   pH 7.5 5.0 - 8.0   Glucose, UA NEGATIVE NEGATIVE mg/dL   Hgb urine dipstick NEGATIVE NEGATIVE   Bilirubin Urine NEGATIVE NEGATIVE   Ketones, ur NEGATIVE NEGATIVE mg/dL   Protein, ur >300 (A) NEGATIVE mg/dL   Urobilinogen, UA 1.0 0.0 - 1.0 mg/dL   Nitrite  NEGATIVE NEGATIVE   Leukocytes, UA LARGE (A) NEGATIVE  Urine culture     Status: None (Preliminary result)   Collection Time: 11/24/14 10:07 PM  Result Value Ref Range   Specimen Description URINE, CATHETERIZED    Special Requests Normal    Colony Count      >=  100,000 COLONIES/ML Performed at [place] Performed at [facility]    Report Status PENDING   Urine microscopic-add on     Status: Abnormal   Collection Time: 11/24/14 10:07 PM  Result Value Ref Range   Squamous Epithelial / LPF RARE RARE   WBC, UA 21-50 <3 WBC/hpf   RBC / HPF 0-2 <3 RBC/hpf   Bacteria, UA MANY (A) RARE  MRSA PCR Screening     Status: None   Collection Time: 11/25/14  2:00 AM  Result Value Ref Range   MRSA by PCR NEGATIVE NEGATIVE    Comment:        The GeneXpert MRSA Assay (FDA approved for NASAL specimens only), is one component of a comprehensive MRSA colonization surveillance program. It is not intended to diagnose MRSA infection nor to guide or monitor treatment for MRSA infections.   Glucose, capillary     Status: Abnormal   Collection Time: 11/25/14  2:57 AM  Result Value Ref Range   Glucose-Capillary 119 (H) 70 - 99 mg/dL  Glucose, capillary     Status: Abnormal   Collection Time: 11/25/14  8:11 AM  Result Value Ref Range   Glucose-Capillary 102 (H) 70 - 99 mg/dL  Glucose, capillary     Status: Abnormal   Collection Time: 11/25/14  1:03 PM  Result Value Ref Range   Glucose-Capillary 165 (H) 70 - 99 mg/dL  Ammonia     Status: None   Collection Time: 11/25/14  3:16 PM  Result Value Ref Range   Ammonia 25 11 - 32 umol/L    Comment: Please note change in reference range.  Glucose, capillary     Status: Abnormal   Collection Time: 11/25/14  5:10 PM  Result Value Ref Range   Glucose-Capillary 153 (H) 70 - 99 mg/dL  Glucose, capillary     Status: None   Collection Time: 11/25/14 10:00 PM  Result Value Ref Range    Glucose-Capillary 80 70 - 99 mg/dL  Basic metabolic panel     Status: Abnormal   Collection Time: 11/26/14 12:20 AM  Result Value Ref Range   Sodium 140 135 - 145 mmol/L    Comment: Please note change in reference range.   Potassium 4.1 3.5 - 5.1 mmol/L    Comment: Please note change in reference range.   Chloride 109 96 - 112 mEq/L   CO2 22 19 - 32 mmol/L   Glucose, Bld 105 (H) 70 - 99 mg/dL   BUN 44 (H) 6 - 23 mg/dL   Creatinine, Ser 1.20 (H) 0.50 - 1.10 mg/dL   Calcium 8.4 8.4 - 10.5 mg/dL   GFR calc non Af Amer 40 (L) >90 mL/min   GFR calc Af Amer 47 (L) >90 mL/min    Comment: (NOTE) The eGFR has been calculated using the CKD EPI equation. This calculation has not been validated in all clinical situations. eGFR's persistently <90 mL/min signify possible Chronic Kidney Disease.    Anion gap 9 5 - 15  Comprehensive metabolic panel     Status: Abnormal   Collection Time: 11/26/14  6:27 AM  Result Value Ref Range   Sodium 140 135 - 145 mmol/L    Comment: Please note change in reference range.   Potassium 4.1 3.5 - 5.1 mmol/L    Comment: Please note change in reference range.   Chloride 111 96 - 112 mEq/L   CO2  20 19 - 32 mmol/L   Glucose, Bld 90 70 - 99 mg/dL   BUN 38 (H) 6 - 23 mg/dL   Creatinine, Ser 1.07 0.50 - 1.10 mg/dL   Calcium 8.3 (L) 8.4 - 10.5 mg/dL   Total Protein 5.9 (L) 6.0 - 8.3 g/dL   Albumin 2.4 (L) 3.5 - 5.2 g/dL   AST 99 (H) 0 - 37 U/L   ALT 172 (H) 0 - 35 U/L   Alkaline Phosphatase 158 (H) 39 - 117 U/L   Total Bilirubin 0.6 0.3 - 1.2 mg/dL   GFR calc non Af Amer 46 (L) >90 mL/min   GFR calc Af Amer 54 (L) >90 mL/min    Comment: (NOTE) The eGFR has been calculated using the CKD EPI equation. This calculation has not been validated in all clinical situations. eGFR's persistently <90 mL/min signify possible Chronic Kidney Disease.    Anion gap 9 5 - 15  Glucose, capillary     Status: None   Collection Time: 11/26/14  7:49 AM  Result Value Ref Range    Glucose-Capillary 78 70 - 99 mg/dL  Glucose, capillary     Status: None   Collection Time: 11/26/14 12:06 PM  Result Value Ref Range   Glucose-Capillary 87 70 - 99 mg/dL  Glucose, capillary     Status: Abnormal   Collection Time: 11/26/14  4:42 PM  Result Value Ref Range   Glucose-Capillary 186 (H) 70 - 99 mg/dL  Glucose, capillary     Status: None   Collection Time: 11/26/14  9:57 PM  Result Value Ref Range   Glucose-Capillary 98 70 - 99 mg/dL  Basic metabolic panel     Status: Abnormal   Collection Time: 11/27/14  8:00 AM  Result Value Ref Range   Sodium 139 135 - 145 mmol/L    Comment: Please note change in reference range.   Potassium 3.9 3.5 - 5.1 mmol/L    Comment: Please note change in reference range.   Chloride 110 96 - 112 mEq/L   CO2 21 19 - 32 mmol/L   Glucose, Bld 136 (H) 70 - 99 mg/dL   BUN 32 (H) 6 - 23 mg/dL   Creatinine, Ser 0.95 0.50 - 1.10 mg/dL   Calcium 8.2 (L) 8.4 - 10.5 mg/dL   GFR calc non Af Amer 53 (L) >90 mL/min   GFR calc Af Amer 62 (L) >90 mL/min    Comment: (NOTE) The eGFR has been calculated using the CKD EPI equation. This calculation has not been validated in all clinical situations. eGFR's persistently <90 mL/min signify possible Chronic Kidney Disease.    Anion gap 8 5 - 15  Glucose, capillary     Status: Abnormal   Collection Time: 11/27/14  8:46 AM  Result Value Ref Range   Glucose-Capillary 120 (H) 70 - 99 mg/dL   Labs are reviewed and are pertinent for elevated LFT's and glucose.  Current Facility-Administered Medications  Medication Dose Route Frequency Provider Last Rate Last Dose  . 0.9 %  sodium chloride infusion   Intravenous Continuous Melton Alar, PA-C 100 mL/hr at 11/26/14 9983    . acetaminophen (TYLENOL) tablet 650 mg  650 mg Oral Q6H PRN Merton Border, MD       Or  . acetaminophen (TYLENOL) suppository 650 mg  650 mg Rectal Q6H PRN Merton Border, MD      . acetaminophen (TYLENOL) tablet 1,000 mg  1,000 mg Oral TID PRN  Merton Border, MD      .  allopurinol (ZYLOPRIM) tablet 100 mg  100 mg Oral BID Merton Border, MD   100 mg at 11/26/14 2230  . amantadine (SYMMETREL) capsule 100 mg  100 mg Oral Daily Merton Border, MD   100 mg at 11/26/14 1257  . antiseptic oral rinse (CPC / CETYLPYRIDINIUM CHLORIDE 0.05%) solution 7 mL  7 mL Mouth Rinse q12n4p Merton Border, MD   7 mL at 11/26/14 1600  . aspirin tablet 325 mg  325 mg Per Tube Daily Merton Border, MD   325 mg at 11/26/14 1258  . chlorhexidine (PERIDEX) 0.12 % solution 15 mL  15 mL Mouth Rinse BID Merton Border, MD   15 mL at 11/27/14 0910  . cholecalciferol (VITAMIN D) tablet 2,000 Units  2,000 Units Oral Daily Merton Border, MD   2,000 Units at 11/26/14 1257  . cloNIDine (CATAPRES) tablet 0.1 mg  0.1 mg Per Tube BID Merton Border, MD   0.1 mg at 11/26/14 2230  . diltiazem (CARDIZEM) tablet 30 mg  30 mg Oral 4 times per day Merton Border, MD   30 mg at 11/26/14 2357  . famotidine (PEPCID) 40 MG/5ML suspension 20 mg  20 mg Per Tube Daily Melton Alar, PA-C   20 mg at 11/26/14 1232  . feeding supplement (ENSURE COMPLETE) (ENSURE COMPLETE) liquid 237 mL  237 mL Oral TID WC Merton Border, MD   237 mL at 11/27/14 0800  . feeding supplement (VITAL AF 1.2 CAL) liquid 280 mL  280 mL Per Tube QID Merton Border, MD   280 mL at 11/26/14 2232  . ferrous sulfate tablet 325 mg  325 mg Oral Q breakfast Merton Border, MD   325 mg at 11/27/14 0910  . free water 300 mL  300 mL Per Tube Q4H Merton Border, MD   300 mL at 11/27/14 0545  . furosemide (LASIX) tablet 20 mg  20 mg Oral Daily Melton Alar, PA-C   20 mg at 11/26/14 1458  . hydrALAZINE (APRESOLINE) injection 10 mg  10 mg Intravenous Q6H PRN Melton Alar, PA-C      . insulin aspart (novoLOG) injection 0-5 Units  0-5 Units Subcutaneous QHS Merton Border, MD   0 Units at 11/25/14 0257  . insulin aspart (novoLOG) injection 0-9 Units  0-9 Units Subcutaneous TID WC Merton Border, MD   2 Units at 11/26/14 1800  . latanoprost (XALATAN) 0.005 % ophthalmic solution 1  drop  1 drop Right Eye QHS Merton Border, MD   1 drop at 11/26/14 2229  . levofloxacin (LEVAQUIN) tablet 250 mg  250 mg Oral Daily Caren Griffins, MD   250 mg at 11/26/14 1258  . LORazepam (ATIVAN) injection 0.25 mg  0.25 mg Intravenous Q12H PRN Melton Alar, PA-C   0.25 mg at 11/26/14 0535  . metoprolol tartrate (LOPRESSOR) 25 mg/10 mL oral suspension 50 mg  50 mg Per Tube BID Almyra Deforest, PA   50 mg at 11/26/14 2231  . morphine 2 MG/ML injection 2 mg  2 mg Intravenous Q4H PRN Merton Border, MD      . ondansetron (ZOFRAN) tablet 4 mg  4 mg Oral Q6H PRN Merton Border, MD       Or  . ondansetron (ZOFRAN) injection 4 mg  4 mg Intravenous Q6H PRN Merton Border, MD      . oxyCODONE (Oxy IR/ROXICODONE) immediate release tablet 5 mg  5 mg Oral Q4H PRN Merton Border, MD      . vitamin B-12 (CYANOCOBALAMIN) tablet  1,000 mcg  1,000 mcg Oral Daily Merton Border, MD   1,000 mcg at 11/26/14 1258    Psychiatric Specialty Exam: Physical Exam as per history and physical  ROS visual hallucinations and ruminations  Blood pressure 100/75, pulse 49, temperature 97.5 F (36.4 C), temperature source Oral, resp. rate 18, height 4' 11"  (1.499 m), weight 58.968 kg (130 lb), SpO2 97 %.Body mass index is 26.24 kg/(m^2).  General Appearance: Casual  Eye Contact::  Good  Speech:  Garbled, Slow and Slurred  Volume:  Normal  Mood:  Anxious and Depressed  Affect:  Non-Congruent and Depressed  Thought Process:  Disorganized and Loose  Orientation:  Full (Time, Place, and Person)  Thought Content:  Hallucinations: Visual and Rumination  Suicidal Thoughts:  No  Homicidal Thoughts:  No  Memory:  Immediate;   Poor Recent;   Poor  Judgement:  Poor  Insight:  Lacking  Psychomotor Activity:  Decreased  Concentration:  Poor  Recall:  Poor  Fund of Knowledge:Fair  Language: Fair  Akathisia:  NA  Handed:  Right  AIMS (if indicated):     Assets:  Financial Resources/Insurance Housing Intimacy Leisure Time Social Support  Sleep:       Musculoskeletal: Strength & Muscle Tone: decreased Gait & Station: unable to stand Patient leans: N/A  Treatment Plan Summary: Daily contact with patient to assess and evaluate symptoms and progress in treatment Medication management  Start Seroquel 25 mg PO BID  Yony Roulston,JANARDHAHA R. 11/27/2014 11:39 AM

## 2014-11-27 NOTE — Progress Notes (Signed)
Pt. HR 49, B/P 100/75, patient is asymptomatic, held 0600 Cardizem dose, [facility],  awaiting verification on  hold parameters. Diamantina MonksARPENTER,Shia Eber, RN

## 2014-11-27 NOTE — Clinical Social Work Note (Signed)
CSW left messages with listed contacts again today in attempts to assess. [facility], but facility states that the family has not placed a hold on the patient's bed. CSW has left report for covering CSW(s).    Roddie McBryant Yareth Kearse MSW, [place] Head[place]CSWA, [place], [phone]

## 2014-11-27 NOTE — Progress Notes (Addendum)
PROGRESS NOTE  Susan Calhoun NGE:[id number] DOB: 13-Aug-1928 DOA: 11/24/2014 PCP: Pcp Not In System  HPI/Subjective: [name], an 81 yo [place] female, (who speakcoming in from SNF with UTI and altered mental status.  Hx of hemorrhagic stroke 09/01/14, at which point she began having mixture of good days and bad days with confusion, disjointed speech, talking to self, and hallucinations.  Had a UTI in 11/15 and presented the same way - lack of speech and inability to follow commands.  Patient mental status improving over stay, and speech more coherent when using [place] interpreter.  Chronic tremors.      Assessment/Plan:  UTI UTI found in ED based on UA and microscopic (11/24/13) - many bacteria present, increased WBC Levofloxacin for hx of klebsiella resistant to cephalosporin Cultures have been re-incubated for better growth.  Altered mental status post hemorrhagic stroke Hemorrhagic stroke 09/01/14, possibly exacerbated by UTI. Mental status improving daily likely at baseline now. Used [place] interpreter 1/7 - speech more coherent than when trying to speak English CT scan without acute abnormality. Per neuro, no further work up indicated at this time.  Bradycardia Acute this hospitalization.  Cardiology consulted on 1/6 Pulse rate in the 40s - 50s. 2-3 second pauses noted by RN on tele.  Cards will round later this evening. Amiodarone d/c'd on 1/6.  Metoprolol and Cardizem d/c'd on 1/7.  Appreciate cards recommendations.   Elevated LFTS Transaminases and Alk phos were elevated. Bili was normal since admission.   Uncertain etiology. Patient was on Amiodarone, Reglan and a statin. Held reglan and lipitor. D/c'd Amio. Pharmacy also recommended changed the protonix to pepcid on 1/7. Cardiology confirms plan to hold statin and amiodarone    Hallucinations Per patient's granddaughter - these began after her hemorrhagic stroke Patient noted to have conversations with self    Psych consulted and recommended starting seroquel.  Given acute cardiac pauses and arrhythmias - we will not start this new medication at this time.  Tremors Unknown etiology, chronic Ativan PRN at nursing home for tremors. Will continue. Holding Reglan which can cause tardive dyskinesia.   DVT Prophylaxis: none because of hemorrhagic stroke in 10/15  Code Status: DNR Family Communication: patient's granddaughter via phone call   Disposition Plan: SNF (Inpatient, SNF, home with medically appropriate)     Consultants:  Neuro  Cardiology  Psych  Procedures:  none  Antibiotics: Anti-infectives    Start     Dose/Rate Route Frequency Ordered Stop   11/25/14 2200  cefTRIAXone (ROCEPHIN) 1 g in dextrose 5 % 50 mL IVPB - Premix  Status:  Discontinued     1 g100 mL/hr over 30 Minutes Intravenous Every 24 hours 11/25/14 0130 11/25/14 1052   11/25/14 1200  levofloxacin (LEVAQUIN) tablet 250 mg     250 mg Oral Daily 11/25/14 1158     11/24/14 2300  cefTRIAXone (ROCEPHIN) 1 g in dextrose 5 % 50 mL IVPB     1 g100 mL/hr over 30 Minutes Intravenous  Once 11/24/14 2254 11/24/14 2356       Objective: Filed Vitals:   11/27/14 1241 11/27/14 1333 11/27/14 1500 11/27/14 1736  BP:  158/40  112/54  Pulse: 50 49  51  Temp:  98.4 F (36.9 C)    TempSrc:  Oral    Resp:  20    Height:      Weight:   62.188 kg (137 lb 1.6 oz)   SpO2:  100%      Intake/Output Summary (Last 24  hours) at 11/27/14 1737 Last data filed at 11/27/14 1700  Gross per 24 hour  Intake   1900 ml  Output      0 ml  Net   1900 ml   Filed Weights   11/25/14 0155 11/26/14 0500 11/27/14 1500  Weight: 55.112 kg (121 lb 8 oz) 58.968 kg (130 lb) 62.188 kg (137 lb 1.6 oz)    Exam: General: Well developed, NAD, appears stated age, laying in bed  HEENT:  Anicteic Sclera, MMM. Neck: Supple, no JVD, no masses  Cardiovascular: S1 S2 auscultated, no rubs, murmurs or gallops.   Respiratory: Clear to auscultation  bilaterally with equal chest rise  Abdomen: Soft, nontender, nondistended, + bowel sounds  Extremities: warm dry without cyanosis clubbing or edema.  Skin: Without rashes exudates or nodules.   Psych:  Observed patient chatting to herself as I stand outside the room.  Pleasant, calm.  Speaks [place].   Data Reviewed: Basic Metabolic Panel:  Recent Labs Lab 11/24/14 2136 11/26/14 0020 11/26/14 0627 11/27/14 0800  NA 140 140 140 139  K 4.3 4.1 4.1 3.9  CL 108 109 111 110  CO2 20 22 20 21   GLUCOSE 144* 105* 90 136*  BUN 65* 44* 38* 32*  CREATININE 1.56* 1.20* 1.07 0.95  CALCIUM 9.2 8.4 8.3* 8.2*   Liver Function Tests:  Recent Labs Lab 11/24/14 2136 11/26/14 0627 11/27/14 1200  AST 152* 99* 81*  ALT 233* 172* 148*  ALKPHOS 211* 158* 161*  BILITOT 0.5 0.6 0.9  PROT 6.7 5.9* 5.8*  ALBUMIN 2.8* 2.4* 2.4*    Recent Labs Lab 11/25/14 1516  AMMONIA 25   CBC:  Recent Labs Lab 11/24/14 2136  WBC 9.1  NEUTROABS 5.4  HGB 10.9*  HCT 34.0*  MCV 94.7  PLT 309   BNP (last 3 results)  Recent Labs  09/16/14 1730  PROBNP 9594.0*   CBG:  Recent Labs Lab 11/26/14 1642 11/26/14 2157 11/27/14 0846 11/27/14 1143 11/27/14 1717  GLUCAP 186* 98 120* 117* 177*    Recent Results (from the past 240 hour(s))  Urine culture     Status: None (Preliminary result)   Collection Time: 11/24/14 10:07 PM  Result Value Ref Range Status   Specimen Description URINE, CATHETERIZED  Final   Special Requests Normal  Final   Colony Count   Final    >=100,000 COLONIES/ML Performed at [facility]    Culture   Final    [place] Performed at [facility]    Report Status PENDING  Incomplete  MRSA PCR Screening     Status: None   Collection Time: 11/25/14  2:00 AM  Result Value Ref Range Status   MRSA by PCR NEGATIVE NEGATIVE Final    Comment:        The GeneXpert MRSA Assay (FDA approved for NASAL specimens only), is one component of  a comprehensive MRSA colonization surveillance program. It is not intended to diagnose MRSA infection nor to guide or monitor treatment for MRSA infections.      Studies: Ct Head Wo Contrast  11/25/2014   CLINICAL DATA:  Acute onset of altered mental status. Initial encounter.  EXAM: CT HEAD WITHOUT CONTRAST  TECHNIQUE: Contiguous axial images were obtained from the base of the skull through the vertex without intravenous contrast.  COMPARISON:  CT of the head performed 10/19/2014  FINDINGS: There is no evidence of acute infarction, mass lesion, or intra- or extra-axial hemorrhage on CT.  A  small focus of calcification at the right parietal lobe is stable from prior studies. Prominence of the ventricles suggests mild cortical volume loss, stable from prior studies; this is slightly out of proportion to sulcal prominence, without definite hydrocephalus. Scattered periventricular white matter change likely reflects small vessel ischemic microangiopathy.  The brainstem and fourth ventricle are within normal limits. The basal ganglia are unremarkable in appearance. The cerebral hemispheres demonstrate grossly normal gray-white differentiation. No mass effect or midline shift is seen.  There is no evidence of fracture; visualized osseous structures are unremarkable in appearance. The orbits are within normal limits. The paranasal sinuses and mastoid air cells are well-aerated. No significant soft tissue abnormalities are seen.  IMPRESSION: 1. No acute intracranial pathology seen on CT. 2. Mild ventriculomegaly again noted, slightly out of proportion to sulcal prominence but stable from prior studies. This is thought to reflect mild cortical volume loss. 3. Small vessel ischemic microangiopathy noted.   Electronically Signed   By: Garald Balding M.D.   On: 11/25/2014 20:44   US Abdomen Limited Ruq  11/25/2014   CLINICAL DATA:  Elevated liver enzymes.  EXAM: US ABDOMEN LIMITED - RIGHT UPPER QUADRANT   COMPARISON:  None.  FINDINGS: Gallbladder:  Minimal layering material in the gallbladder consistent with sludge. No stones. No gallbladder wall thickening or edema. Murphy's sign is negative.  Common bile duct:  Diameter: 5.2 mm, normal  Liver:  No focal lesion identified. Within normal limits in parenchymal echogenicity.  IMPRESSION: Minimal sludge in the gallbladder. No stones or inflammatory changes identified.   Electronically Signed   By: Lucienne Capers M.D.   On: 11/25/2014 21:06    Scheduled Meds: . allopurinol  100 mg Oral BID  . amantadine  100 mg Oral Daily  . antiseptic oral rinse  7 mL Mouth Rinse q12n4p  . aspirin  325 mg Per Tube Daily  . chlorhexidine  15 mL Mouth Rinse BID  . cholecalciferol  2,000 Units Oral Daily  . cloNIDine  0.1 mg Per Tube BID  . famotidine  20 mg Per Tube Daily  . feeding supplement (ENSURE COMPLETE)  237 mL Oral TID WC  . feeding supplement (VITAL AF 1.2 CAL)  280 mL Per Tube QID  . ferrous sulfate  325 mg Oral Q breakfast  . free water  300 mL Per Tube Q4H  . furosemide  20 mg Oral Daily  . insulin aspart  0-5 Units Subcutaneous QHS  . insulin aspart  0-9 Units Subcutaneous TID WC  . latanoprost  1 drop Right Eye QHS  . levofloxacin  250 mg Oral Daily  . QUEtiapine  25 mg Oral BID  . vitamin B-12  1,000 mcg Oral Daily   Continuous Infusions: . sodium chloride 100 mL/hr at 11/27/14 1501    Principal Problem:   Acute encephalopathy Active Problems:   UTI (lower urinary tract infection)   DNR (do not resuscitate) discussion   Urinary tract infection   Elevated LFTs   Acute renal failure   Acute cystitis without hematuria   AKI (acute kidney injury)   Bradycardia   Chrys Racer E. Howell-Methvin, PA-S Imogene Burn, PA-C  [facility] Pager [phone]. If 7PM-7AM, please contact night-coverage at [email or web address], password [facility] 11/27/2014, 5:37 PM  LOS: 3 days   I have seen and evaluated this patient and agree with the PA note. Patient  had brief asymptomatic sinus pause today, cardiology following. meds adjusted. Patient is currently pleasant and calm, able to answer questions appropriately, daughter  at bedside. Plan of care explained to daughter, she expressed understanding.

## 2014-11-27 NOTE — Progress Notes (Signed)
Patient Name: Susan Calhoun Date of Encounter: 11/27/2014  Principal Problem:   Acute encephalopathy Active Problems:   UTI (lower urinary tract infection)   DNR (do not resuscitate) discussion   Urinary tract infection   Elevated LFTs   Acute renal failure   Acute cystitis without hematuria   AKI (acute kidney injury)   Bradycardia   Primary Cardiologist: Dr. Katrinka BlazingSmith (consultant in 08/2014)  Patient Profile: 81 yo female w/ hx HTN, DM, dementia, s/p CABG, PVD, CEA, hemorrhagic CVA 08/2014, admitted 01/04 with AMS, UTI. Cardiology   SUBJECTIVE: Feels weak, has been that way since C. Diff started. No palpitations.  OBJECTIVE Filed Vitals:   11/27/14 1241 11/27/14 1333 11/27/14 1500 11/27/14 1736  BP:  158/40  112/54  Pulse: 50 49  51  Temp:  98.4 F (36.9 C)    TempSrc:  Oral    Resp:  20    Height:      Weight:   137 lb 1.6 oz (62.188 kg)   SpO2:  100%      Intake/Output Summary (Last 24 hours) at 11/27/14 1759 Last data filed at 11/27/14 1700  Gross per 24 hour  Intake   1900 ml  Output      0 ml  Net   1900 ml   Filed Weights   11/25/14 0155 11/26/14 0500 11/27/14 1500  Weight: 121 lb 8 oz (55.112 kg) 130 lb (58.968 kg) 137 lb 1.6 oz (62.188 kg)    PHYSICAL EXAM General: Well developed, well nourished, female in no acute distress. Head: Normocephalic, atraumatic.  Neck: Supple without bruits, JVD not elevated . Lungs:  Resp regular and unlabored, CTA. Heart: RRR, S1, S2, no S3, S4, or murmur; no rub. Abdomen: Soft, non-tender, non-distended, BS + x 4.  Extremities: No clubbing, cyanosis, no edema.  Neuro: Alert and oriented X 3. Moves all extremities spontaneously. Psych: Normal affect.  LABS: CBC: Recent Labs  11/24/14 2136  WBC 9.1  NEUTROABS 5.4  HGB 10.9*  HCT 34.0*  MCV 94.7  PLT 309   Basic Metabolic Panel: Recent Labs  11/26/14 0627 11/27/14 0800  NA 140 139  K 4.1 3.9  CL 111 110  CO2 20 21  GLUCOSE 90 136*  BUN 38*  32*  CREATININE 1.07 0.95  CALCIUM 8.3* 8.2*   Liver Function Tests: Recent Labs  11/26/14 0627 11/27/14 1200  AST 99* 81*  ALT 172* 148*  ALKPHOS 158* 161*  BILITOT 0.6 0.9  PROT 5.9* 5.8*  ALBUMIN 2.4* 2.4*   TELE:  SR, ?U wave, sinus brady, pauses up to 3.4 sec, ?non-conducted PAC  Radiology/Studies: Ct Head Wo Contrast  11/25/2014   CLINICAL DATA:  Acute onset of altered mental status. Initial encounter.  EXAM: CT HEAD WITHOUT CONTRAST  TECHNIQUE: Contiguous axial images were obtained from the base of the skull through the vertex without intravenous contrast.  COMPARISON:  CT of the head performed 10/19/2014  FINDINGS: There is no evidence of acute infarction, mass lesion, or intra- or extra-axial hemorrhage on CT.  A small focus of calcification at the right parietal lobe is stable from prior studies. Prominence of the ventricles suggests mild cortical volume loss, stable from prior studies; this is slightly out of proportion to sulcal prominence, without definite hydrocephalus. Scattered periventricular white matter change likely reflects small vessel ischemic microangiopathy.  The brainstem and fourth ventricle are within normal limits. The basal ganglia are unremarkable in appearance. The cerebral hemispheres demonstrate grossly normal gray-white  differentiation. No mass effect or midline shift is seen.  There is no evidence of fracture; visualized osseous structures are unremarkable in appearance. The orbits are within normal limits. The paranasal sinuses and mastoid air cells are well-aerated. No significant soft tissue abnormalities are seen.  IMPRESSION: 1. No acute intracranial pathology seen on CT. 2. Mild ventriculomegaly again noted, slightly out of proportion to sulcal prominence but stable from prior studies. This is thought to reflect mild cortical volume loss. 3. Small vessel ischemic microangiopathy noted.   Electronically Signed   By: Roanna Raider M.D.   On: 11/25/2014  20:44   US Abdomen Limited Ruq  11/25/2014   CLINICAL DATA:  Elevated liver enzymes.  EXAM: US ABDOMEN LIMITED - RIGHT UPPER QUADRANT  COMPARISON:  None.  FINDINGS: Gallbladder:  Minimal layering material in the gallbladder consistent with sludge. No stones. No gallbladder wall thickening or edema. Murphy's sign is negative.  Common bile duct:  Diameter: 5.2 mm, normal  Liver:  No focal lesion identified. Within normal limits in parenchymal echogenicity.  IMPRESSION: Minimal sludge in the gallbladder. No stones or inflammatory changes identified.   Electronically Signed   By: Burman Nieves M.D.   On: 11/25/2014 21:06     Current Medications:  . allopurinol  100 mg Oral BID  . amantadine  100 mg Oral Daily  . antiseptic oral rinse  7 mL Mouth Rinse q12n4p  . aspirin  325 mg Per Tube Daily  . chlorhexidine  15 mL Mouth Rinse BID  . cholecalciferol  2,000 Units Oral Daily  . cloNIDine  0.1 mg Per Tube BID  . famotidine  20 mg Per Tube Daily  . feeding supplement (ENSURE COMPLETE)  237 mL Oral TID WC  . feeding supplement (VITAL AF 1.2 CAL)  280 mL Per Tube QID  . ferrous sulfate  325 mg Oral Q breakfast  . free water  300 mL Per Tube Q4H  . furosemide  20 mg Oral Daily  . insulin aspart  0-5 Units Subcutaneous QHS  . insulin aspart  0-9 Units Subcutaneous TID WC  . latanoprost  1 drop Right Eye QHS  . levofloxacin  250 mg Oral Daily  . QUEtiapine  25 mg Oral BID  . vitamin B-12  1,000 mcg Oral Daily   . sodium chloride 100 mL/hr at 11/27/14 1501    ASSESSMENT AND PLAN:  Bradycardia - She has sinus brady, in the 40s at times, but generally in the 50s or higher.Her BP has not been low. She has occasional pauses, up to 3.4 seconds, but these are not clearly symptomatic. Her BB, CCB, amio and clonidine were stopped today. Follow on telemetry as meds wear off. At this time, no indication for a pacemaker but we will continue to follow.     History of atrial fib - reason for amio, will have  to watch HR/rhythm off meds.   Principal Problem:   Acute encephalopathy Active Problems:   UTI (lower urinary tract infection)   DNR (do not resuscitate) discussion   Urinary tract infection   Elevated LFTs   Acute renal failure   Acute cystitis without hematuria   AKI (acute kidney injury)     Signed, Theodore Demark , PA-C 5:59 PM 11/27/2014  I have seen and examined the patient along with Theodore Demark , PA-C.  I have reviewed the chart, notes and new data.  I agree with PA's note.  I would hold all medications that could worsen chromotropic function  for the time being. Suspect we will have to reintroduce clonidine and/or metoprolol and lower doses to avoid rebound. The effects of amiodarone will only dissipate after many weeks. As long she is asymptomatic, pacemaker therapy is not indicated  Thurmon Fair, MD, [facility] [phone] 11/27/2014, 6:25 PM

## 2014-11-28 DIAGNOSIS — I10 Essential (primary) hypertension: Secondary | ICD-10-CM

## 2014-11-28 DIAGNOSIS — K9420 Gastrostomy complication, unspecified: Secondary | ICD-10-CM | POA: Insufficient documentation

## 2014-11-28 DIAGNOSIS — K9423 Gastrostomy malfunction: Secondary | ICD-10-CM

## 2014-11-28 LAB — GLUCOSE, CAPILLARY
GLUCOSE-CAPILLARY: 195 mg/dL — AB (ref 70–99)
GLUCOSE-CAPILLARY: 158 mg/dL — AB (ref 70–99)
GLUCOSE-CAPILLARY: 156 mg/dL — AB (ref 70–99)
Glucose-Capillary: 111 mg/dL — ABNORMAL HIGH (ref 70–99)
Glucose-Capillary: 118 mg/dL — ABNORMAL HIGH (ref 70–99)
Glucose-Capillary: 133 mg/dL — ABNORMAL HIGH (ref 70–99)
Glucose-Capillary: 220 mg/dL — ABNORMAL HIGH (ref 70–99)

## 2014-11-28 LAB — URINE CULTURE
Colony Count: >=100000
Special Requests: NORMAL

## 2014-11-28 LAB — BASIC METABOLIC PANEL
Anion gap: 9 (ref 5–15)
BUN: 25 mg/dL — AB (ref 6–23)
CO2: 21 mmol/L (ref 19–32)
CREATININE: 0.82 mg/dL (ref 0.50–1.10)
Calcium: 8.7 mg/dL (ref 8.4–10.5)
Chloride: 109 mEq/L (ref 96–112)
GFR calc Af Amer: 74 mL/min — ABNORMAL LOW (ref >90)
GFR calc non Af Amer: 64 mL/min — ABNORMAL LOW (ref >90)
Glucose, Bld: 109 mg/dL — ABNORMAL HIGH (ref 70–99)
POTASSIUM: 3.5 mmol/L (ref 3.5–5.1)
Sodium: 139 mmol/L (ref 135–145)

## 2014-11-28 LAB — TSH: TSH: 2.082 u[IU]/mL (ref 0.350–4.500)

## 2014-11-28 LAB — MAGNESIUM: Magnesium: 1.6 mg/dL (ref 1.5–2.5)

## 2014-11-28 MED ORDER — FAMOTIDINE 40 MG/5ML PO SUSR: 20.0000 mg | Freq: Every day | ORAL | Status: AC

## 2014-11-28 MED ORDER — AMIODARONE HCL 100 MG PO TABS: 100.0000 mg | ORAL_TABLET | Freq: Every day | ORAL | Status: AC

## 2014-11-28 MED ORDER — INSULIN ASPART 100 UNIT/ML ~~LOC~~ SOLN: 0.0000 [IU] | Freq: Every day | SUBCUTANEOUS | Status: AC

## 2014-11-28 MED ORDER — METOCLOPRAMIDE HCL 5 MG/5ML PO SOLN
5.0000 mg | Freq: Three times a day (TID) | ORAL | Status: DC
  Administered 2014-11-28 – 2014-12-01 (×13): 5 mg via NASOGASTRIC
  Filled 2014-11-28 (×17): qty 5

## 2014-11-28 MED ORDER — METOCLOPRAMIDE HCL 10 MG PO TABS: 10.0000 mg | ORAL_TABLET | Freq: Three times a day (TID) | ORAL | Status: DC

## 2014-11-28 MED ORDER — METOCLOPRAMIDE HCL 5 MG PO TABS
5.0000 mg | ORAL_TABLET | Freq: Three times a day (TID) | ORAL | Status: DC
  Filled 2014-11-28 (×3): qty 1

## 2014-11-28 MED ORDER — BOOST / RESOURCE BREEZE PO LIQD
1.0000 | Freq: Two times a day (BID) | ORAL | Status: DC
  Administered 2014-11-29 – 2014-11-30 (×4): 1 via ORAL

## 2014-11-28 MED ORDER — AMIODARONE HCL 100 MG PO TABS
100.0000 mg | ORAL_TABLET | Freq: Every day | ORAL | Status: DC
  Administered 2014-11-28 – 2014-12-01 (×4): 100 mg via ORAL
  Filled 2014-11-28 (×4): qty 1

## 2014-11-28 MED ORDER — AMLODIPINE BESYLATE 2.5 MG PO TABS
2.5000 mg | ORAL_TABLET | Freq: Two times a day (BID) | ORAL | Status: DC
  Administered 2014-11-28: 2.5 mg via ORAL
  Filled 2014-11-28 (×2): qty 1

## 2014-11-28 MED ORDER — VITAL AF 1.2 CAL PO LIQD
1000.0000 mL | ORAL | Status: DC
  Administered 2014-11-28 – 2014-12-01 (×3): 1000 mL
  Filled 2014-11-28 (×5): qty 1000

## 2014-11-28 MED ORDER — AMLODIPINE BESYLATE 2.5 MG PO TABS
2.5000 mg | ORAL_TABLET | Freq: Two times a day (BID) | ORAL | Status: DC
  Administered 2014-11-28: 2.5 mg via ORAL
  Filled 2014-11-28: qty 1

## 2014-11-28 MED ORDER — AMLODIPINE BESYLATE 2.5 MG PO TABS
2.5000 mg | ORAL_TABLET | Freq: Two times a day (BID) | ORAL | Status: AC
  Administered 2014-11-29: 2.5 mg via ORAL
  Filled 2014-11-28: qty 1

## 2014-11-28 MED ORDER — AMLODIPINE BESYLATE 10 MG PO TABS
10.0000 mg | ORAL_TABLET | Freq: Every day | ORAL | Status: DC
Start: 2014-11-29 — End: 2014-12-01
  Administered 2014-11-29 – 2014-12-01 (×3): 10 mg via ORAL
  Filled 2014-11-28 (×3): qty 1

## 2014-11-28 MED ORDER — AMLODIPINE BESYLATE 10 MG PO TABS: 10.0000 mg | ORAL_TABLET | Freq: Every day | ORAL | Status: DC

## 2014-11-28 MED ORDER — INSULIN ASPART 100 UNIT/ML ~~LOC~~ SOLN: 0.0000 [IU] | Freq: Three times a day (TID) | SUBCUTANEOUS | Status: AC

## 2014-11-28 NOTE — Progress Notes (Signed)
Subjective: Sleeping comfortablly   Objective: Filed Vitals:   11/27/14 2149 11/28/14 0503 11/28/14 0734 11/28/14 0944  BP: 188/47 184/45 186/50 156/42  Pulse:      Temp: 99 F (37.2 C) 100.2 F (37.9 C)    TempSrc: Oral Oral    Resp: 20 18    Height:      Weight:  137 lb 12.6 oz (62.5 kg)    SpO2: 97% 100%     Weight change:   Intake/Output Summary (Last 24 hours) at 11/28/14 1108 Last data filed at 11/28/14 1001  Gross per 24 hour  Intake   1000 ml  Output      0 ml  Net   1000 ml    General: Sleeping   Neck:  JVP is normal Heart: Regular rate and rhythm, without murmurs, rubs, gallops.  Lungs: Clear anteriorly. Exemities:  No edema.   Neuro: Grossly intact, nonfocal.  Tele:    SB  50s   Lab Results: Results for orders placed or performed during the hospital encounter of 11/24/14 (from the past 24 hour(s))  Glucose, capillary     Status: Abnormal   Collection Time: 11/27/14 11:43 AM  Result Value Ref Range   Glucose-Capillary 117 (H) 70 - 99 mg/dL  Hepatic function panel     Status: Abnormal   Collection Time: 11/27/14 12:00 PM  Result Value Ref Range   Total Protein 5.8 (L) 6.0 - 8.3 g/dL   Albumin 2.4 (L) 3.5 - 5.2 g/dL   AST 81 (H) 0 - 37 U/L   ALT 148 (H) 0 - 35 U/L   Alkaline Phosphatase 161 (H) 39 - 117 U/L   Total Bilirubin 0.9 0.3 - 1.2 mg/dL   Bilirubin, Direct 0.3 0.0 - 0.3 mg/dL   Indirect Bilirubin 0.6 0.3 - 0.9 mg/dL  Glucose, capillary     Status: Abnormal   Collection Time: 11/27/14  5:17 PM  Result Value Ref Range   Glucose-Capillary 177 (H) 70 - 99 mg/dL  Glucose, capillary     Status: Abnormal   Collection Time: 11/27/14 10:51 PM  Result Value Ref Range   Glucose-Capillary 157 (H) 70 - 99 mg/dL   Comment 1 Documented in Chart    Comment 2 Notify RN   Glucose, capillary     Status: Abnormal   Collection Time: 11/28/14  2:02 AM  Result Value Ref Range   Glucose-Capillary 220 (H) 70 - 99 mg/dL   Comment 1 Documented in Chart    Comment 2 Notify RN   Glucose, capillary     Status: Abnormal   Collection Time: 11/28/14  4:56 AM  Result Value Ref Range   Glucose-Capillary 195 (H) 70 - 99 mg/dL  Glucose, capillary     Status: Abnormal   Collection Time: 11/28/14  7:52 AM  Result Value Ref Range   Glucose-Capillary 158 (H) 70 - 99 mg/dL    Studies/Results: No results found.  Medications:  Reviewed   @  1.  Bradycardia  Hr is sl faster than yesterday  No pauses  Occaional PVCs.    2  Afib   Had episode of WCT this fall Felt to be afib with aberrancy   Has been on amiodarone   I would place back on amiodarone 100 mg  Given rapid afib  Will follow of clon, lopressor, dilt.   Not an anticoag candidate  3.  Hx CAD  S/p CABG No symptoms of angina    4.  Chronic  diastolic CHF  Volume status is not bad.   No change in meds.    5/  HTN  Will add amlodipine 2.5 mg bid to regimen    LOS: 4 days   Dietrich PatesPaula Neli Fofana 11/28/2014, 11:08 AM

## 2014-11-28 NOTE — Progress Notes (Signed)
NUTRITION FOLLOW UP  INTERVENTION: Provide Resource Breeze po BID, each supplement provides 250 kcal and 9 grams of protein  Change bolus feeds to continuous feeds of Vital AF 1.2 via PEG at 20 ml/hr and increase by 10 ml every 4 hours to goal rate of 50 ml/hr to provide 1440 kcal, 90 grams of protein, and 972 ml of free water.  Continue free water flushes.  Encourage PO intake.  Will continue to monitor.  NUTRITION DIAGNOSIS: Inadequate oral intake related to dysphagia as evidenced by tube feeding via PEG; ongoing  Goal: Pt to meet >/= 90% of their estimated nutrition needs; met  Monitor:  PO intake, TF tolerance, weight trends, labs, I/O's  81 y.o. female  Admitting Dx: UTI  ASSESSMENT: Nursing home resident with a past medical history significant for atrial fibrillation , diabetes mellitus and dysphagia on tube feeding sent for evaluation for fever and decreased mentation patient has history of dementia and recent hemorrhagic stroke. Pt was found to have a urinary tract infection with renal insufficiency.   RD contacted by RN to change bolus feeds to continuous feeds as pt had vomited her bolus feed earlier today. Pt has been downgraded to a clear liquid diet. Will order [employer]. Pt has been previously on a dysphagia 1 diet with meal completion of 25%. Will continue to monitor.  Labs and medications reviewed.  Height: Ht Readings from Last 1 Encounters:  11/25/14 4' 11"  (1.499 m)    Weight: Wt Readings from Last 1 Encounters:  11/28/14 137 lb 12.6 oz (62.5 kg)    BMI:  Body mass index is 27.81 kg/(m^2).  Re-Estimated Nutritional Needs: Kcal: 1400-1700 Protein: 75-90 grams Fluid: >/= 1.5 L/day  Skin: DTI on coccyx, +2 UE edema  Diet Order: Diet clear liquid    Intake/Output Summary (Last 24 hours) at 11/28/14 1352 Last data filed at 11/28/14 1001  Gross per 24 hour  Intake   1000 ml  Output      0 ml  Net   1000 ml    Last BM:  1/5  Labs:   Recent Labs Lab 11/26/14 0020 11/26/14 0627 11/27/14 0800  NA 140 140 139  K 4.1 4.1 3.9  CL 109 111 110  CO2 22 20 21   BUN 44* 38* 32*  CREATININE 1.20* 1.07 0.95  CALCIUM 8.4 8.3* 8.2*  GLUCOSE 105* 90 136*    CBG (last 3)   Recent Labs  11/28/14 0456 11/28/14 0752 11/28/14 1305  GLUCAP 195* 158* 156*    Scheduled Meds: . allopurinol  100 mg Oral BID  . amantadine  100 mg Oral Daily  . amiodarone  100 mg Oral Daily  . [START ON 11/29/2014] amLODipine  10 mg Oral Daily  . amLODipine  2.5 mg Oral BID  . antiseptic oral rinse  7 mL Mouth Rinse q12n4p  . aspirin  325 mg Per Tube Daily  . chlorhexidine  15 mL Mouth Rinse BID  . cholecalciferol  2,000 Units Oral Daily  . famotidine  20 mg Per Tube Daily  . feeding supplement (VITAL AF 1.2 CAL)  280 mL Per Tube QID  . ferrous sulfate  325 mg Oral Q breakfast  . free water  300 mL Per Tube Q4H  . furosemide  20 mg Oral Daily  . insulin aspart  0-5 Units Subcutaneous QHS  . insulin aspart  0-9 Units Subcutaneous TID WC  . latanoprost  1 drop Right Eye QHS  . levofloxacin  250 mg  Oral Daily  . metoCLOPramide  10 mg Per Tube TID AC & HS  . vitamin B-12  1,000 mcg Oral Daily    Continuous Infusions:    Past Medical History  Diagnosis Date  . Diabetes mellitus with diabetic polyneuropathy   . A-fib   . Renal disorder   . Hypertension   . Anemia   . Glaucoma     right eye   . Carotid artery stenosis   . PVD (peripheral vascular disease)     Past Surgical History  Procedure Laterality Date  . Replacement total knee Right   . Carotid endarterectomy  2015  . Coronary artery bypass graft    . Ablation      cardiac for arrthymia     Kallie Locks, MS, RD, LDN Pager # [phone] After hours/ weekend pager # [phone]

## 2014-11-28 NOTE — Progress Notes (Signed)
Physical Therapy Treatment Patient Details Name: Susan Calhoun MRN: Susan [id number] DOB: 07/02/1928 Today's Date: 11/28/2014    History of Present Illness Susan Calhoun is an 81 y.o. Female SNF resident with PMH of a fib, DM, dysphagia on PEG feeding, hx of dementia and recent hemorrhagic stroke. Pt admitted from SNF due to fever and decreased mentation and found to have UTI with renal insufficiency.     PT Comments    Progressing very slowly.  Emphasis on bed mobility and sitting EOB to work on sitting tolerance, balance and overall activity tolerance.   Follow Up Recommendations  SNF     Equipment Recommendations  None recommended by PT    Recommendations for Other Services       Precautions / Restrictions Precautions Precautions: Fall Restrictions Weight Bearing Restrictions: No    Mobility  Bed Mobility Overal bed mobility: Needs Assistance Bed Mobility: Supine to Sit     Supine to sit: Max assist;+2 for safety/equipment     General bed mobility comments: step by step cuing for sequencing to EOB.  Moving slow to keep pt's anxiety and shaking to a minimum  Transfers   Equipment used: None             General transfer comment: did not transfer to chair today  Ambulation/Gait                 Stairs            Wheelchair Mobility    Modified Rankin (Stroke Patients Only) Modified Rankin (Stroke Patients Only) Modified Rankin: Severe disability     Balance Overall balance assessment: Needs assistance Sitting-balance support: Bilateral upper extremity supported Sitting balance-Leahy Scale: Poor Sitting balance - Comments: sat EOB >15 minutes working on sitting tolerance and balance.  Added in reaching to get her to move from bilateral to unilateral support                            Cognition Arousal/Alertness: Awake/alert Behavior During Therapy: Anxious;WFL for tasks assessed/performed Overall Cognitive Status: No  family/caregiver present to determine baseline cognitive functioning   Orientation Level: Disoriented to;Place;Situation Current Attention Level: Focused Memory: Decreased recall of precautions;Decreased short-term memory Following Commands: Follows one step commands with increased time Safety/Judgement: Decreased awareness of safety;Decreased awareness of deficits Awareness: Intellectual Problem Solving: Slow processing      Exercises General Exercises - Lower Extremity Heel Slides: AAROM;AROM;Strengthening;Both;10 reps;Supine (resisted gross extension) Hip ABduction/ADduction: AAROM;Both;10 reps;Supine    General Comments        Pertinent Vitals/Pain Pain Assessment: No/denies pain    Home Living                      Prior Function            PT Goals (current goals can now be found in the care plan section) Acute Rehab PT Goals Patient Stated Goal: none stated PT Goal Formulation: With patient/family Time For Goal Achievement: 12/10/14 Potential to Achieve Goals: Fair Progress towards PT goals: Not progressing toward goals - comment (still at EOB level not ready to work on transfers or pre gai)    Frequency  Min 2X/week    PT Plan Current plan remains appropriate    Co-evaluation             End of Session Equipment Utilized During Treatment: Oxygen Activity Tolerance: Patient limited by fatigue;Patient tolerated treatment well Patient left: in bed;with  call bell/phone within reach     Time: 1308-1331 PT Time Calculation (min) (ACUTE ONLY): 23 min  Charges:  $Therapeutic Activity: 23-37 mins                    G Codes:      Danahi Reddish, Eliseo Gum 11/28/2014, 2:11 PM 11/28/2014  [place], PT [phone]  (pager)

## 2014-11-28 NOTE — Progress Notes (Signed)
PROGRESS NOTE  Susan Calhoun ZOX:[id number] DOB: 06-17-1928 DOA: 11/24/2014 PCP: Pcp Not In System  HPI/Subjective: [name], an 81 yo [place] female, (who speaks some English) who comes in from SNF with UTI and altered mental status.  Hx of hemorrhagic stroke 09/01/14, after which she began having a mixture of good days and bad days with confusion, disjointed speech, talking to self, and hallucinations.  She had a UTI in 11/15 and presented the same way - lack of speech and inability to follow commands.  Patient mental status improving over stay, and speech more coherent when using [place] interpreter.  Chronic tremors.    Assessment/Plan:  UTI UTI found in ED based on UA and microscopic (11/24/13) - many bacteria present, increased WBC Levofloxacin for hx of klebsiella resistant to cephalosporin Cultures show klebsiella and serratia - sensitive to levaquin.  Will continue levaquin for 7 days.  Altered mental status post hemorrhagic stroke Hemorrhagic stroke 09/01/14, possibly exacerbated by UTI. Mental status improving daily likely at baseline now. Used [place] interpreter 1/7 - speech more coherent than when trying to speak English CT scan without acute abnormality. Per neuro, no further work up indicated at this time.  Bradycardia Acute this hospitalization.  Cardiology consulted on 1/6 Pulse rate in the 40s - 50s. 2-3 second pauses noted by RN on tele.  Appreciate cardiology input.  Amio changed to 100 mg.  Metoprolol and Dilt changed to Norvasc only.   Dysphagia  Now with intolerance of PEG feedings. Patient vomiting (1/8)   Will restart reglan.  Check residuals, and change to continuous feeds.   Elevated LFTS Transaminases and Alk phos were elevated. Bili was normal since admission.   Uncertain etiology. Likely acute infection. Pharmacy also recommended changed the protonix to pepcid on 1/7. LFTs trending down.  Hallucinations Per patient's granddaughter - these  began after her hemorrhagic stroke Patient noted to have conversations with self  Psych consulted and recommended starting seroquel.  Given acute cardiac pauses and arrhythmias - we will not start this new medication at this time.  Tremors Unknown etiology, chronic Ativan PRN at nursing home for tremors. Will continue. Holding Reglan which can cause tardive dyskinesia.  DVT Prophylaxis: none because of hemorrhagic stroke in 10/15  Code Status: DNR Family Communication: patient's granddaughter via phone call   Disposition Plan: SNF (Inpatient, SNF, home with medically appropriate)  Consultants:  Neuro  Cardiology  Psych  Procedures:  none  Antibiotics: Anti-infectives    Start     Dose/Rate Route Frequency Ordered Stop   11/25/14 2200  cefTRIAXone (ROCEPHIN) 1 g in dextrose 5 % 50 mL IVPB - Premix  Status:  Discontinued     1 g100 mL/hr over 30 Minutes Intravenous Every 24 hours 11/25/14 0130 11/25/14 1052   11/25/14 1200  levofloxacin (LEVAQUIN) tablet 250 mg     250 mg Oral Daily 11/25/14 1158     11/24/14 2300  cefTRIAXone (ROCEPHIN) 1 g in dextrose 5 % 50 mL IVPB     1 g100 mL/hr over 30 Minutes Intravenous  Once 11/24/14 2254 11/24/14 2356       Objective: Filed Vitals:   11/28/14 0734 11/28/14 0944 11/28/14 1201 11/28/14 1308  BP: 186/50 156/42 154/90 110/31  Pulse:    71  Temp:    98.3 F (36.8 C)  TempSrc:    Oral  Resp:    16  Height:      Weight:      SpO2:    98%  Intake/Output Summary (Last 24 hours) at 11/28/14 1337 Last data filed at 11/28/14 1001  Gross per 24 hour  Intake   1000 ml  Output      0 ml  Net   1000 ml   Filed Weights   11/26/14 0500 11/27/14 1500 11/28/14 0503  Weight: 58.968 kg (130 lb) 62.188 kg (137 lb 1.6 oz) 62.5 kg (137 lb 12.6 oz)    Exam: General: Well developed, NAD, appears stated age, lying in bed, speech is garbled but makes sense. HEENT:  Anicteic Sclera, MMM. Neck: Supple, no JVD, no masses    Cardiovascular: S1 S2 auscultated, no rubs, murmurs or gallops.   Respiratory: Clear to auscultation bilaterally with equal chest rise  Abdomen: Soft, nontender, nondistended, + bowel sounds  Extremities: warm dry without cyanosis clubbing or edema.  Skin: Without rashes exudates or nodules.      Data Reviewed: Basic Metabolic Panel:  Recent Labs Lab 11/24/14 2136 11/26/14 0020 11/26/14 0627 11/27/14 0800  NA 140 140 140 139  K 4.3 4.1 4.1 3.9  CL 108 109 111 110  CO2 20 22 20 21   GLUCOSE 144* 105* 90 136*  BUN 65* 44* 38* 32*  CREATININE 1.56* 1.20* 1.07 0.95  CALCIUM 9.2 8.4 8.3* 8.2*   Liver Function Tests:  Recent Labs Lab 11/24/14 2136 11/26/14 0627 11/27/14 1200  AST 152* 99* 81*  ALT 233* 172* 148*  ALKPHOS 211* 158* 161*  BILITOT 0.5 0.6 0.9  PROT 6.7 5.9* 5.8*  ALBUMIN 2.8* 2.4* 2.4*    Recent Labs Lab 11/25/14 1516  AMMONIA 25   CBC:  Recent Labs Lab 11/24/14 2136  WBC 9.1  NEUTROABS 5.4  HGB 10.9*  HCT 34.0*  MCV 94.7  PLT 309   BNP (last 3 results)  Recent Labs  09/16/14 1730  PROBNP 9594.0*   CBG:  Recent Labs Lab 11/27/14 2251 11/28/14 0202 11/28/14 0456 11/28/14 0752 11/28/14 1305  GLUCAP 157* 220* 195* 158* 156*    Recent Results (from the past 240 hour(s))  Urine culture     Status: None   Collection Time: 11/24/14 10:07 PM  Result Value Ref Range Status   Specimen Description URINE, CATHETERIZED  Final   Special Requests Normal  Final   Colony Count   Final    >=100,000 COLONIES/ML Performed at [facility]    Culture   Final    [employer] Performed at [facility]    Report Status 11/28/2014 FINAL  Final   Organism ID, Bacteria KLEBSIELLA PNEUMONIAE  Final   Organism ID, Bacteria SERRATIA MARCESCENS  Final      Susceptibility   Klebsiella pneumoniae - MIC*    AMPICILLIN >=32 RESISTANT Resistant     CEFAZOLIN >=64 RESISTANT Resistant     CEFTRIAXONE 32  INTERMEDIATE Intermediate     CIPROFLOXACIN <=0.25 SENSITIVE Sensitive     GENTAMICIN >=16 RESISTANT Resistant     LEVOFLOXACIN <=0.12 SENSITIVE Sensitive     NITROFURANTOIN 64 INTERMEDIATE Intermediate     TOBRAMYCIN <=1 SENSITIVE Sensitive     TRIMETH/SULFA <=20 SENSITIVE Sensitive     PIP/TAZO 64 INTERMEDIATE Intermediate     * KLEBSIELLA PNEUMONIAE   Serratia marcescens - MIC*    CEFAZOLIN >=64 RESISTANT Resistant     CEFTRIAXONE <=1 SENSITIVE Sensitive     CIPROFLOXACIN <=0.25 SENSITIVE Sensitive     GENTAMICIN <=1 SENSITIVE Sensitive     LEVOFLOXACIN <=0.12 SENSITIVE Sensitive     NITROFURANTOIN 256  RESISTANT Resistant     TOBRAMYCIN 4 SENSITIVE Sensitive     TRIMETH/SULFA <=20 SENSITIVE Sensitive     * SERRATIA MARCESCENS  MRSA PCR Screening     Status: None   Collection Time: 11/25/14  2:00 AM  Result Value Ref Range Status   MRSA by PCR NEGATIVE NEGATIVE Final    Comment:        The GeneXpert MRSA Assay (FDA approved for NASAL specimens only), is one component of a comprehensive MRSA colonization surveillance program. It is not intended to diagnose MRSA infection nor to guide or monitor treatment for MRSA infections.      Studies: No results found.  Scheduled Meds: . allopurinol  100 mg Oral BID  . amantadine  100 mg Oral Daily  . amiodarone  100 mg Oral Daily  . [START ON 11/29/2014] amLODipine  10 mg Oral Daily  . amLODipine  2.5 mg Oral BID  . antiseptic oral rinse  7 mL Mouth Rinse q12n4p  . aspirin  325 mg Per Tube Daily  . chlorhexidine  15 mL Mouth Rinse BID  . cholecalciferol  2,000 Units Oral Daily  . famotidine  20 mg Per Tube Daily  . feeding supplement (VITAL AF 1.2 CAL)  280 mL Per Tube QID  . ferrous sulfate  325 mg Oral Q breakfast  . free water  300 mL Per Tube Q4H  . furosemide  20 mg Oral Daily  . insulin aspart  0-5 Units Subcutaneous QHS  . insulin aspart  0-9 Units Subcutaneous TID WC  . latanoprost  1 drop Right Eye QHS  .  levofloxacin  250 mg Oral Daily  . metoCLOPramide  10 mg Per Tube TID AC & HS  . vitamin B-12  1,000 mcg Oral Daily   Continuous Infusions:    Principal Problem:   Acute encephalopathy Active Problems:   UTI (lower urinary tract infection)   DNR (do not resuscitate) discussion   Urinary tract infection   Elevated LFTs   Acute renal failure   Acute cystitis without hematuria   AKI (acute kidney injury)   Bradycardia  Imogene Burn, PA-C  [facility] Pager [phone]. If 7PM-7AM, please contact night-coverage at [email or web address], password [facility] 11/28/2014, 1:37 PM  LOS: 4 days    I have examined the patient and reviewed the chart. I agree with the Advanced Practitioner's note. Patient's discharge delayed due to n/v, tube feeds changed to continuous, reglan restarted. Proceed with discharge tomorrow if patient tolerate tube feeds.   Florencia Reasons MD/PhD [facility]

## 2014-11-28 NOTE — Care Management Note (Addendum)
    Page 1 of 1   12/01/2014     7:14:11 PM CARE MANAGEMENT NOTE 12/01/2014  Patient:  Susan Calhoun,Susan Calhoun   Account Number:  [id number]  Date Initiated:  11/28/2014  Documentation initiated by:  Susan Calhoun  Subjective/Objective Assessment:   dx ams, fever, arf, uti  admit- from [facility] snf     Action/Plan:   pt eval- snf   Anticipated DC Date:  12/01/2014   Anticipated DC Plan:  SKILLED NURSING FACILITY  In-house referral  Clinical Social Worker      DC Planning Services  CM consult      Choice offered to / List presented to:             Status of service:  Completed, signed off Medicare Important Message given?  YES (If response is "NO", the following Medicare IM given date fields will be blank) Date Medicare IM given:  11/26/2014 Medicare IM given by:  Susan Calhoun Date Additional Medicare IM given:  12/01/2014 Additional Medicare IM given by:  Susan Calhoun  Discharge Disposition:  SKILLED NURSING FACILITY  Per UR Regulation:  Reviewed for med. necessity/level of care/duration of stay  If discussed at Long Length of Stay Meetings, dates discussed:    Comments:  11/28/14 1122 Susan Capeeborah Matis [name] RN, BSN [phone]  patient is for dc tomorrow back to [facility].  MD will need to call Monique before patient is dc at [phone],  PA informed.

## 2014-11-28 NOTE — Clinical Social Work Note (Signed)
Clinical Social Work Department BRIEF PSYCHOSOCIAL ASSESSMENT 11/28/2014  Patient:  Susan Calhoun,Susan Calhoun     Account Number:  [id number]     Admit date:  11/24/2014  Clinical Social Worker:  Verl BlalockSCINTO,JESSE, LCSW  Date/Time:  11/28/2014 10:30 AM  Referred by:  CSW  Date Referred:  11/27/2014 Referred for  SNF Placement   Other Referral:   Interview type:  Family Other interview type:   Spoke with patient granddaughter, Susan Calhoun over the phone    PSYCHOSOCIAL DATA Living Status:  FACILITY [facility] Level of care:  Skilled Nursing Facility Primary support name:  Susan Calhoun [phone] Primary support relationship to patient:  FAMILY Degree of support available:   Strong    CURRENT CONCERNS Current Concerns  Post-Acute Placement   Other Concerns:    SOCIAL WORK ASSESSMENT / PLAN Clinical Social Worker spoke with patient granddaughter over the phone to offer support and discuss patient needs at discharge.  Patient granddaughter states that [facility] and plans to return at discharge.  CSW explained to patient granddaughter that due to patient insurance change, patient is now out of network for [facility] and there is a $160 copay.  Patient granddaughter is willing to pay the copay to keep patient at [facility].  Patient granddaughter went to [facility] today to complete necessary paperwork with plans for patient to discharge tomorrow.  Facility is prepared for weekend admission and on call insurance authorization to be notified by CSW.  CSW remains available for support and to facilitate patient discharge needs once medically stable.   Assessment/plan status:  Psychosocial Support/Ongoing Assessment of Needs Other assessment/ plan:   Information/referral to community resources:   Visual merchandiserClinical Social Worker offered patient granddaughter the opportunity to initiate a new SNF search to find in network facility -  patient granddaughter refused.    PATIENT'S/FAMILY'S RESPONSE TO PLAN OF CARE: Patient is alert but not oriented at this time.  Patient family supportive and willing to take care of necessary steps for patient return today.  Patient family understanding of CSW role and anticipate patient discharge over the weekend.  Patient granddaughter verbalized appreciation for CSW support and concern.

## 2014-11-28 NOTE — Progress Notes (Signed)
Patient Name: Susan Calhoun Date of Encounter: 11/28/2014     Principal Problem:   Acute encephalopathy Active Problems:   UTI (lower urinary tract infection)   DNR (do not resuscitate) discussion   Urinary tract infection   Elevated LFTs   Acute renal failure   Acute cystitis without hematuria   AKI (acute kidney injury)   Bradycardia    SUBJECTIVE  Difficult to understand, but seems to be oriented. She denies CP or SOB. It seems like she reported some slight dizziness but no syncope- she is hard to interpret.   CURRENT MEDS . allopurinol  100 mg Oral BID  . amantadine  100 mg Oral Daily  . antiseptic oral rinse  7 mL Mouth Rinse q12n4p  . aspirin  325 mg Per Tube Daily  . chlorhexidine  15 mL Mouth Rinse BID  . cholecalciferol  2,000 Units Oral Daily  . cloNIDine  0.1 mg Per Tube BID  . famotidine  20 mg Per Tube Daily  . feeding supplement (ENSURE COMPLETE)  237 mL Oral TID WC  . feeding supplement (VITAL AF 1.2 CAL)  280 mL Per Tube QID  . ferrous sulfate  325 mg Oral Q breakfast  . free water  300 mL Per Tube Q4H  . furosemide  20 mg Oral Daily  . insulin aspart  0-5 Units Subcutaneous QHS  . insulin aspart  0-9 Units Subcutaneous TID WC  . latanoprost  1 drop Right Eye QHS  . levofloxacin  250 mg Oral Daily  . vitamin B-12  1,000 mcg Oral Daily    OBJECTIVE  Filed Vitals:   11/27/14 1736 11/27/14 2149 11/28/14 0503 11/28/14 0734  BP: 112/54 188/47 184/45 186/50  Pulse: 51     Temp:  99 F (37.2 C) 100.2 F (37.9 C)   TempSrc:  Oral Oral   Resp:  20 18   Height:      Weight:   137 lb 12.6 oz (62.5 kg)   SpO2:  97% 100%     Intake/Output Summary (Last 24 hours) at 11/28/14 5956 Last data filed at 11/27/14 1700  Gross per 24 hour  Intake   1000 ml  Output      0 ml  Net   1000 ml   Filed Weights   11/26/14 0500 11/27/14 1500 11/28/14 0503  Weight: 130 lb (58.968 kg) 137 lb 1.6 oz (62.188 kg) 137 lb 12.6 oz (62.5 kg)    PHYSICAL  EXAM  General: Well developed, NAD, appears stated age, laying in bed  HEENT: Anicteic Sclera, MMM.  Neck: Supple, no JVD, no masses, proximal end of right clavicle slightly bruised and tender to palpation  Cardiovascular: S1 S2 auscultated, no murmurs heard  Respiratory: Clear to auscultation bilaterally Psych: pleasant, speaking gibberish mixed with some intelligible phrases. Accessory Clinical Findings  CBC No results for input(s): WBC, NEUTROABS, HGB, HCT, MCV, PLT in the last 72 hours. Basic Metabolic Panel  Recent Labs  11/26/14 0627 11/27/14 0800  NA 140 139  K 4.1 3.9  CL 111 110  CO2 20 21  GLUCOSE 90 136*  BUN 38* 32*  CREATININE 1.07 0.95  CALCIUM 8.3* 8.2*   Liver Function Tests  Recent Labs  11/26/14 0627 11/27/14 1200  AST 99* 81*  ALT 172* 148*  ALKPHOS 158* 161*  BILITOT 0.6 0.9  PROT 5.9* 5.8*  ALBUMIN 2.4* 2.4*    TELE  Atrial fibrillation with freq PVCs and 2-3sec pauses. Some periods of bradycardia  50s   Radiology/Studies  Dg Chest 1 View  11/24/2014   CLINICAL DATA:  Shortness of breath.  Urinary tract infection.  EXAM: CHEST - 1 VIEW  COMPARISON:  10/08/2014  FINDINGS: Patient is post median sternotomy. The cardiomediastinal contours are unchanged. Heart is at the upper limits of normal in size. No pulmonary edema. No consolidation to suggest pneumonia. No large pleural effusion. No pneumothorax. Chronic change of both shoulders again seen.  IMPRESSION: No acute pulmonary process.   Electronically Signed   By: Rubye OaksMelanie  Ehinger M.D.   On: 11/24/2014 23:11   Dg Abd 1 View  11/24/2014   CLINICAL DATA:  Complications of feeding tube.  EXAM: ABDOMEN - 1 VIEW  COMPARISON:  10/18/2014  FINDINGS: Gastrostomy tube tip and balloon the are localized to the left upper quadrant, consistent with location in the stomach. Positioning is similar to previous study. Normal bowel gas pattern. Degenerative changes in the spine and hips. Calcifications in the  pelvis consistent with phleboliths.  IMPRESSION: Gastrostomy tube projects over the stomach.   Electronically Signed   By: Burman NievesWilliam  Stevens M.D.   On: 11/24/2014 23:07   Ct Head Wo Contrast  11/25/2014   CLINICAL DATA:  Acute onset of altered mental status. Initial encounter.  EXAM: CT HEAD WITHOUT CONTRAST  TECHNIQUE: Contiguous axial images were obtained from the base of the skull through the vertex without intravenous contrast.  COMPARISON:  CT of the head performed 10/19/2014  FINDINGS: There is no evidence of acute infarction, mass lesion, or intra- or extra-axial hemorrhage on CT.  A small focus of calcification at the right parietal lobe is stable from prior studies. Prominence of the ventricles suggests mild cortical volume loss, stable from prior studies; this is slightly out of proportion to sulcal prominence, without definite hydrocephalus. Scattered periventricular white matter change likely reflects small vessel ischemic microangiopathy.  The brainstem and fourth ventricle are within normal limits. The basal ganglia are unremarkable in appearance. The cerebral hemispheres demonstrate grossly normal gray-white differentiation. No mass effect or midline shift is seen.  There is no evidence of fracture; visualized osseous structures are unremarkable in appearance. The orbits are within normal limits. The paranasal sinuses and mastoid air cells are well-aerated. No significant soft tissue abnormalities are seen.  IMPRESSION: 1. No acute intracranial pathology seen on CT. 2. Mild ventriculomegaly again noted, slightly out of proportion to sulcal prominence but stable from prior studies. This is thought to reflect mild cortical volume loss. 3. Small vessel ischemic microangiopathy noted.   Electronically Signed   By: Roanna RaiderJeffery  Chang M.D.   On: 11/25/2014 20:44   [place] Abdomen Limited Ruq  11/25/2014   CLINICAL DATA:  Elevated liver enzymes.  EXAM: US ABDOMEN LIMITED - RIGHT UPPER QUADRANT  COMPARISON:  None.   FINDINGS: Gallbladder:  Minimal layering material in the gallbladder consistent with sludge. No stones. No gallbladder wall thickening or edema. Murphy's sign is negative.  Common bile duct:  Diameter: 5.2 mm, normal  Liver:  No focal lesion identified. Within normal limits in parenchymal echogenicity.  IMPRESSION: Minimal sludge in the gallbladder. No stones or inflammatory changes identified.   Electronically Signed   By: Burman NievesWilliam  Stevens M.D.   On: 11/25/2014 21:06    ASSESSMENT AND PLAN  Jamesetta SoGiuseppina Rikard is a 81 y.o. female with a history of CAD s/p CABG, PAF s/p ablation 04/2014, HTN, anemia, dysphagia on PEG tube feeding glaucoma, PVD s/p carotid endarterectomy and recent hemorrhagic stroke in 08/2014 who presented with AMS  and found to have UTI. Cardiology consulted on elevated transaminase and bradycardia in the setting of amiodarone therapy.   Bradycardia- Atrial fibrillation with freq PVCs and 2-3sec pauses. Some periods of bradycardia 50s  -- Holding amiodarone indefinitely. Metoprolol discontinued.  -- Clonidine reintroduced to avoid rebound. Her pressures have been high but are improving. The effects of amiodarone will only dissipate after many weeks. -- As long she is asymptomatic, pacemaker therapy is not indicated  Elevated LFT:  -- Agree with holding statin and amiodarone -- AST 81/ ALT 148, which is improved from 152/233  AMS related to UTI, dementia and polypharmacy  A-fib, she remains in atrial fib -- CHA2DS2-Vasc score 7 (age, female, HTN, DM, stroke)but not a candidate for anticoagulation given recent hemorrhagic stroke  Hypertension- BP a little high but improved after clonidine reintroduced.   Dysphagia on PEG tube feeding  Dementia  History of CAD status post CABG  History of PVD status post carotid endarterectomy  Recent hemorrhagic stroke in October 2015   Signed, Janetta Hora PA-C  Pager [phone]

## 2014-11-29 DIAGNOSIS — E876 Hypokalemia: Secondary | ICD-10-CM

## 2014-11-29 DIAGNOSIS — R319 Hematuria, unspecified: Secondary | ICD-10-CM

## 2014-11-29 DIAGNOSIS — I25812 Atherosclerosis of bypass graft of coronary artery of transplanted heart without angina pectoris: Secondary | ICD-10-CM

## 2014-11-29 DIAGNOSIS — I455 Other specified heart block: Secondary | ICD-10-CM

## 2014-11-29 DIAGNOSIS — N390 Urinary tract infection, site not specified: Principal | ICD-10-CM

## 2014-11-29 DIAGNOSIS — I498 Other specified cardiac arrhythmias: Secondary | ICD-10-CM

## 2014-11-29 DIAGNOSIS — I472 Ventricular tachycardia: Secondary | ICD-10-CM

## 2014-11-29 DIAGNOSIS — I4891 Unspecified atrial fibrillation: Secondary | ICD-10-CM

## 2014-11-29 DIAGNOSIS — I340 Nonrheumatic mitral (valve) insufficiency: Secondary | ICD-10-CM

## 2014-11-29 LAB — COMPREHENSIVE METABOLIC PANEL
ALT: 144 U/L — AB (ref 0–35)
ANION GAP: 9 (ref 5–15)
AST: 88 U/L — AB (ref 0–37)
Albumin: 2.3 g/dL — ABNORMAL LOW (ref 3.5–5.2)
Alkaline Phosphatase: 192 U/L — ABNORMAL HIGH (ref 39–117)
BILIRUBIN TOTAL: 0.6 mg/dL (ref 0.3–1.2)
BUN: 24 mg/dL — ABNORMAL HIGH (ref 6–23)
CO2: 22 mmol/L (ref 19–32)
Calcium: 8.5 mg/dL (ref 8.4–10.5)
Chloride: 107 mEq/L (ref 96–112)
Creatinine, Ser: 0.9 mg/dL (ref 0.50–1.10)
GFR calc Af Amer: 66 mL/min — ABNORMAL LOW (ref >90)
GFR calc non Af Amer: 57 mL/min — ABNORMAL LOW (ref >90)
GLUCOSE: 137 mg/dL — AB (ref 70–99)
Potassium: 3.4 mmol/L — ABNORMAL LOW (ref 3.5–5.1)
Sodium: 138 mmol/L (ref 135–145)
Total Protein: 5.9 g/dL — ABNORMAL LOW (ref 6.0–8.3)

## 2014-11-29 LAB — GLUCOSE, CAPILLARY
GLUCOSE-CAPILLARY: 118 mg/dL — AB (ref 70–99)
GLUCOSE-CAPILLARY: 142 mg/dL — AB (ref 70–99)
Glucose-Capillary: 145 mg/dL — ABNORMAL HIGH (ref 70–99)
Glucose-Capillary: 152 mg/dL — ABNORMAL HIGH (ref 70–99)
Glucose-Capillary: 160 mg/dL — ABNORMAL HIGH (ref 70–99)
Glucose-Capillary: 222 mg/dL — ABNORMAL HIGH (ref 70–99)

## 2014-11-29 MED ORDER — MAGNESIUM OXIDE 400 (241.3 MG) MG PO TABS
400.0000 mg | ORAL_TABLET | Freq: Once | ORAL | Status: AC
  Administered 2014-11-29: 400 mg via ORAL
  Filled 2014-11-29: qty 1

## 2014-11-29 MED ORDER — POTASSIUM CHLORIDE CRYS ER 20 MEQ PO TBCR
40.0000 meq | EXTENDED_RELEASE_TABLET | Freq: Once | ORAL | Status: AC
  Administered 2014-11-29: 40 meq via ORAL
  Filled 2014-11-29: qty 2

## 2014-11-29 MED ORDER — POTASSIUM CHLORIDE 20 MEQ PO PACK: 40.0000 meq | PACK | Freq: Once | ORAL | Status: DC

## 2014-11-29 MED ORDER — INSULIN ASPART 100 UNIT/ML ~~LOC~~ SOLN
0.0000 [IU] | SUBCUTANEOUS | Status: DC
  Administered 2014-11-29: 2 [IU] via SUBCUTANEOUS
  Administered 2014-11-29: 1 [IU] via SUBCUTANEOUS
  Administered 2014-11-29: 2 [IU] via SUBCUTANEOUS
  Administered 2014-11-29 – 2014-11-30 (×2): 3 [IU] via SUBCUTANEOUS
  Administered 2014-11-30 (×3): 2 [IU] via SUBCUTANEOUS
  Administered 2014-11-30: 1 [IU] via SUBCUTANEOUS
  Administered 2014-12-01: 2 [IU] via SUBCUTANEOUS
  Administered 2014-12-01 (×4): 1 [IU] via SUBCUTANEOUS

## 2014-11-29 MED ORDER — MAGNESIUM SULFATE IN D5W 10-5 MG/ML-% IV SOLN
1.0000 g | Freq: Once | INTRAVENOUS | Status: AC
  Administered 2014-11-29: 1 g via INTRAVENOUS
  Filled 2014-11-29 (×3): qty 100

## 2014-11-29 NOTE — Progress Notes (Signed)
Pt had one episode of vomiting with bed change. Emesis same color and consistency of tube feeding. Will continue to monitor.  Pt also had a 9-beat run and 11-beat run of PVCs. Claiborne Billingsallahan, NP made aware. Will continue to monitor.

## 2014-11-29 NOTE — Progress Notes (Signed)
PROGRESS NOTE  Susan Calhoun PNT:[id number] DOB: [birth year] DOA: 11/24/2014 PCP: Pcp Not In System  HPI/Subjective: Susan Calhoun, an 81 yo [place] female, (who speaks some English) who comes in from SNF with UTI and altered mental status.  Hx of hemorrhagic stroke 09/01/14, after which she began having a mixture of good days and bad days with confusion, disjointed speech, talking to self, and hallucinations.  She had a UTI in 11/15 and presented the same way - lack of speech and inability to follow commands.  Patient mental status improving over stay, and speech more coherent when using [place] interpreter.  Chronic tremors.    Assessment/Plan:  UTI UTI found in ED based on UA and microscopic (11/24/13) - many bacteria present, increased WBC Levofloxacin for hx of klebsiella resistant to cephalosporin Cultures show klebsiella and serratia - sensitive to levaquin.  Will continue levaquin for 7 days.  Altered mental status post hemorrhagic stroke Improving, worsening mental status than baseline likely due to uti/metabolic encephalopathy. H/o Hemorrhagic stroke 09/01/14, possibly exacerbated by UTI. Mental status improving daily likely at baseline now. Used [place] interpreter 1/7 - speech more coherent than when trying to speak English CT scan without acute abnormality. Per neuro, no further work up indicated at this time.  Bradycardia Acute this hospitalization.  Cardiology consulted on 1/6  Pulse rate in the 40s - 50s. 2-3 second pauses noted by RN on tele.  Appreciate cardiology input.  Amio changed to 100 mg.  Metoprolol and Dilt changed to Norvasc only. frequent pvs' keep k>4, mag>2. H/o afib, not a candidate for anticoagulation due to h/o hemorrhagic stroke in 08/2014.   Dysphagia  Now with intolerance of PEG feedings. Patient vomiting (1/8)   Will restart reglan.  Check residuals, and change to continuous feeds.   Elevated LFTS Transaminases and Alk phos were elevated. Bili  was normal since admission.   Uncertain etiology. Likely acute infection. Pharmacy also recommended changed the protonix to pepcid on 1/7. LFTs trending down,   Hallucinations Per patient's granddaughter - these began after her hemorrhagic stroke Patient noted to have conversations with self  Psych consulted and recommended starting seroquel.  Given acute cardiac pauses and arrhythmias - we will not start this new medication at this time.  Tremors Unknown etiology, chronic Ativan PRN at nursing home for tremors. Will continue. Holding Reglan which can cause tardive dyskinesia.  DVT Prophylaxis: none because of hemorrhagic stroke in 10/15  Code Status: DNR Family Communication: patient's granddaughter via phone call   Disposition Plan: SNF (Inpatient, SNF, home with medically appropriate)  Consultants:  Neuro  Cardiology  Psych  Procedures:  none  Antibiotics: Anti-infectives    Start     Dose/Rate Route Frequency Ordered Stop   11/25/14 2200  cefTRIAXone (ROCEPHIN) 1 g in dextrose 5 % 50 mL IVPB - Premix  Status:  Discontinued     1 g100 mL/hr over 30 Minutes Intravenous Every 24 hours 11/25/14 0130 11/25/14 1052   11/25/14 1200  levofloxacin (LEVAQUIN) tablet 250 mg     250 mg Oral Daily 11/25/14 1158     11/24/14 2300  cefTRIAXone (ROCEPHIN) 1 g in dextrose 5 % 50 mL IVPB     1 g100 mL/hr over 30 Minutes Intravenous  Once 11/24/14 2254 11/24/14 2356       Objective: Filed Vitals:   11/29/14 0320 11/29/14 0528 11/29/14 0930 11/29/14 1340  BP:  129/51 133/73 156/62  Pulse:    72  Temp:  98.7 F (37.1 C)  98.8 F (37.1 C)  TempSrc:  Oral  Oral  Resp:  24  20  Height:      Weight: 60.3 kg (132 lb 15 oz)     SpO2:  95%  100%    Intake/Output Summary (Last 24 hours) at 11/29/14 1708 Last data filed at 11/29/14 1506  Gross per 24 hour  Intake   2129 ml  Output      0 ml  Net   2129 ml   Filed Weights   11/27/14 1500 11/28/14 0503 11/29/14 0320  Weight:  62.188 kg (137 lb 1.6 oz) 62.5 kg (137 lb 12.6 oz) 60.3 kg (132 lb 15 oz)    Exam: General: Well developed, NAD, appears stated age, lying in bed, speech is garbled but makes sense. HEENT:  Anicteic Sclera, MMM. Neck: Supple, no JVD, no masses  Cardiovascular: S1 S2 auscultated, no rubs, murmurs or gallops.   Respiratory: Clear to auscultation bilaterally with equal chest rise  Abdomen: Soft, nontender, nondistended, + bowel sounds , peg tube in place,currnently on continuous tube feeds. Extremities: warm dry without cyanosis clubbing or edema.  Skin: Without rashes exudates or nodules.      Data Reviewed: Basic Metabolic Panel:  Recent Labs Lab 11/26/14 0020 11/26/14 0627 11/27/14 0800 11/28/14 2057 11/29/14 0524  NA 140 140 139 139 138  K 4.1 4.1 3.9 3.5 3.4*  CL 109 111 110 109 107  CO2 22 20 21 21 22   GLUCOSE 105* 90 136* 109* 137*  BUN 44* 38* 32* 25* 24*  CREATININE 1.20* 1.07 0.95 0.82 0.90  CALCIUM 8.4 8.3* 8.2* 8.7 8.5  MG  --   --   --  1.6  --    Liver Function Tests:  Recent Labs Lab 11/24/14 2136 11/26/14 0627 11/27/14 1200 11/29/14 0524  AST 152* 99* 81* 88*  ALT 233* 172* 148* 144*  ALKPHOS 211* 158* 161* 192*  BILITOT 0.5 0.6 0.9 0.6  PROT 6.7 5.9* 5.8* 5.9*  ALBUMIN 2.8* 2.4* 2.4* 2.3*    Recent Labs Lab 11/25/14 1516  AMMONIA 25   CBC:  Recent Labs Lab 11/24/14 2136  WBC 9.1  NEUTROABS 5.4  HGB 10.9*  HCT 34.0*  MCV 94.7  PLT 309   BNP (last 3 results)  Recent Labs  09/16/14 1730  PROBNP 9594.0*   CBG:  Recent Labs Lab 11/28/14 2015 11/28/14 2356 11/29/14 0430 11/29/14 0740 11/29/14 1114  GLUCAP 111* 118* 118* 142* 222*    Recent Results (from the past 240 hour(s))  Urine culture     Status: None   Collection Time: 11/24/14 10:07 PM  Result Value Ref Range Status   Specimen Description URINE, CATHETERIZED  Final   Special Requests Normal  Final   Colony Count   Final    >=100,000 COLONIES/ML Performed at  [facility]    Culture   Final    [place] Performed at [facility]    Report Status 11/28/2014 FINAL  Final   Organism ID, Bacteria KLEBSIELLA PNEUMONIAE  Final   Organism ID, Bacteria SERRATIA MARCESCENS  Final      Susceptibility   Klebsiella pneumoniae - MIC*    AMPICILLIN >=32 RESISTANT Resistant     CEFAZOLIN >=64 RESISTANT Resistant     CEFTRIAXONE 32 INTERMEDIATE Intermediate     CIPROFLOXACIN <=0.25 SENSITIVE Sensitive     GENTAMICIN >=16 RESISTANT Resistant     LEVOFLOXACIN <=0.12 SENSITIVE Sensitive     NITROFURANTOIN 64  INTERMEDIATE Intermediate     TOBRAMYCIN <=1 SENSITIVE Sensitive     TRIMETH/SULFA <=20 SENSITIVE Sensitive     PIP/TAZO 64 INTERMEDIATE Intermediate     * KLEBSIELLA PNEUMONIAE   Serratia marcescens - MIC*    CEFAZOLIN >=64 RESISTANT Resistant     CEFTRIAXONE <=1 SENSITIVE Sensitive     CIPROFLOXACIN <=0.25 SENSITIVE Sensitive     GENTAMICIN <=1 SENSITIVE Sensitive     LEVOFLOXACIN <=0.12 SENSITIVE Sensitive     NITROFURANTOIN 256 RESISTANT Resistant     TOBRAMYCIN 4 SENSITIVE Sensitive     TRIMETH/SULFA <=20 SENSITIVE Sensitive     * SERRATIA MARCESCENS  MRSA PCR Screening     Status: None   Collection Time: 11/25/14  2:00 AM  Result Value Ref Range Status   MRSA by PCR NEGATIVE NEGATIVE Final    Comment:        The GeneXpert MRSA Assay (FDA approved for NASAL specimens only), is one component of a comprehensive MRSA colonization surveillance program. It is not intended to diagnose MRSA infection nor to guide or monitor treatment for MRSA infections.      Studies: No results found.  Scheduled Meds: . allopurinol  100 mg Oral BID  . amantadine  100 mg Oral Daily  . amiodarone  100 mg Oral Daily  . amLODipine  10 mg Oral Daily  . antiseptic oral rinse  7 mL Mouth Rinse q12n4p  . aspirin  325 mg Per Tube Daily  . chlorhexidine  15 mL Mouth Rinse BID  . cholecalciferol  2,000  Units Oral Daily  . famotidine  20 mg Per Tube Daily  . feeding supplement (RESOURCE BREEZE)  1 Container Oral BID BM  . ferrous sulfate  325 mg Oral Q breakfast  . free water  300 mL Per Tube Q4H  . insulin aspart  0-9 Units Subcutaneous 6 times per day  . latanoprost  1 drop Right Eye QHS  . levofloxacin  250 mg Oral Daily  . metoCLOPramide  5 mg Per NG tube TID AC & HS  . vitamin B-12  1,000 mcg Oral Daily   Continuous Infusions: . feeding supplement (VITAL AF 1.2 CAL) 1,000 mL (11/29/14 0438)    Principal Problem:   Acute encephalopathy Active Problems:   UTI (lower urinary tract infection)   DNR (do not resuscitate) discussion   Urinary tract infection   Elevated LFTs   Acute renal failure   Acute cystitis without hematuria   AKI (acute kidney injury)   Bradycardia   Complication of feeding tube   Florencia Reasons MD/PhD [facility] Pager [phone]. If 7PM-7AM, please contact night-coverage at [email or web address], password [facility] 11/29/2014, 5:08 PM  LOS: 5 days

## 2014-11-29 NOTE — Progress Notes (Signed)
Patient had 6 beat run of PVCs. MD Roda ShuttersXu made aware. No new orders at this time.

## 2014-11-29 NOTE — Progress Notes (Signed)
SUBJECTIVE: Denies chest pain, shortness of breath, and palpitations. Resting comfortably.     Intake/Output Summary (Last 24 hours) at 11/29/14 0952 Last data filed at 11/29/14 0934  Gross per 24 hour  Intake   1872 ml  Output      0 ml  Net   1872 ml    Current Facility-Administered Medications  Medication Dose Route Frequency Provider Last Rate Last Dose  . acetaminophen (TYLENOL) tablet 650 mg  650 mg Oral Q6H PRN Carron Curie, MD       Or  . acetaminophen (TYLENOL) suppository 650 mg  650 mg Rectal Q6H PRN Carron Curie, MD      . acetaminophen (TYLENOL) tablet 1,000 mg  1,000 mg Oral TID PRN Carron Curie, MD      . allopurinol (ZYLOPRIM) tablet 100 mg  100 mg Oral BID Carron Curie, MD   100 mg at 11/29/14 0932  . amantadine (SYMMETREL) capsule 100 mg  100 mg Oral Daily Carron Curie, MD   100 mg at 11/29/14 0932  . amiodarone (PACERONE) tablet 100 mg  100 mg Oral Daily Pricilla Riffle, MD   100 mg at 11/29/14 0932  . amLODipine (NORVASC) tablet 10 mg  10 mg Oral Daily Stephani Police, PA-C   10 mg at 11/29/14 0932  . antiseptic oral rinse (CPC / CETYLPYRIDINIUM CHLORIDE 0.05%) solution 7 mL  7 mL Mouth Rinse q12n4p Carron Curie, MD   7 mL at 11/28/14 1600  . aspirin tablet 325 mg  325 mg Per Tube Daily Carron Curie, MD   325 mg at 11/29/14 0932  . chlorhexidine (PERIDEX) 0.12 % solution 15 mL  15 mL Mouth Rinse BID Carron Curie, MD   15 mL at 11/29/14 0933  . cholecalciferol (VITAMIN D) tablet 2,000 Units  2,000 Units Oral Daily Carron Curie, MD   2,000 Units at 11/29/14 0933  . famotidine (PEPCID) 40 MG/5ML suspension 20 mg  20 mg Per Tube Daily Stephani Police, PA-C   20 mg at 11/29/14 0934  . feeding supplement (RESOURCE BREEZE) (RESOURCE BREEZE) liquid 1 Container  1 Container Oral BID BM Marijean Niemann, RD   1 Container at 11/29/14 1000  . feeding supplement (VITAL AF 1.2 CAL) liquid 1,000 mL  1,000 mL Per Tube Continuous Marijean Niemann, RD 50 mL/hr at 11/29/14 0438 1,000 mL at 11/29/14 0438   . ferrous sulfate tablet 325 mg  325 mg Oral Q breakfast Carron Curie, MD   325 mg at 11/29/14 0932  . free water 300 mL  300 mL Per Tube Q4H Carron Curie, MD   300 mL at 11/29/14 0945  . furosemide (LASIX) tablet 20 mg  20 mg Oral Daily Stephani Police, PA-C   20 mg at 11/29/14 0932  . insulin aspart (novoLOG) injection 0-9 Units  0-9 Units Subcutaneous 6 times per day Rolan Lipa, NP   2 Units at 11/29/14 0933  . latanoprost (XALATAN) 0.005 % ophthalmic solution 1 drop  1 drop Right Eye QHS Carron Curie, MD   1 drop at 11/28/14 2332  . levofloxacin (LEVAQUIN) tablet 250 mg  250 mg Oral Daily Leatha Gilding, MD   250 mg at 11/29/14 0933  . LORazepam (ATIVAN) injection 0.25 mg  0.25 mg Intravenous Q12H PRN Stephani Police, PA-C   0.25 mg at 11/26/14 0535  . metoCLOPramide (REGLAN) 5 MG/5ML solution 5 mg  5 mg Per NG tube TID AC & HS Parke Poisson  Roda ShuttersXu, MD   5 mg at 11/29/14 0547  . morphine 2 MG/ML injection 2 mg  2 mg Intravenous Q4H PRN Carron CurieAli Hijazi, MD      . ondansetron (ZOFRAN) tablet 4 mg  4 mg Oral Q6H PRN Carron CurieAli Hijazi, MD       Or  . ondansetron (ZOFRAN) injection 4 mg  4 mg Intravenous Q6H PRN Carron CurieAli Hijazi, MD      . oxyCODONE (Oxy IR/ROXICODONE) immediate release tablet 5 mg  5 mg Oral Q4H PRN Carron CurieAli Hijazi, MD      . vitamin B-12 (CYANOCOBALAMIN) tablet 1,000 mcg  1,000 mcg Oral Daily Carron CurieAli Hijazi, MD   1,000 mcg at 11/29/14 0932    Filed Vitals:   11/28/14 2334 11/29/14 0320 11/29/14 0528 11/29/14 0930  BP: 178/40  129/51 133/73  Pulse:      Temp:   98.7 F (37.1 C)   TempSrc:   Oral   Resp:   24   Height:      Weight:  132 lb 15 oz (60.3 kg)    SpO2:   95%     PHYSICAL EXAM General: NAD HEENT: Normal. Neck: No JVD, no thyromegaly.  Lungs: Clear to auscultation bilaterally with normal respiratory effort. CV: Nondisplaced PMI.  Irregular rhythm, normal S1/S2, no S3, 2/6 holosystolic murmur along left sternal border.  No pretibial edema.  Abdomen: Soft, nontender, no distention.    Neurologic: Alert.  Psych: Normal affect. Musculoskeletal: No gross deformities. Extremities: No clubbing or cyanosis.   TELEMETRY: Numerous runs of NSVT (longest 16 beats), PAC's, sinus rhythm/brady, atrial fibrillation, asymptomatic pauses.  LABS: Basic Metabolic Panel:  Recent Labs  19/14/7800/07/06 2057 11/29/14 0524  NA 139 138  K 3.5 3.4*  CL 109 107  CO2 21 22  GLUCOSE 109* 137*  BUN 25* 24*  CREATININE 0.82 0.90  CALCIUM 8.7 8.5  MG 1.6  --    Liver Function Tests:  Recent Labs  11/27/14 1200 11/29/14 0524  AST 81* 88*  ALT 148* 144*  ALKPHOS 161* 192*  BILITOT 0.9 0.6  PROT 5.8* 5.9*  ALBUMIN 2.4* 2.3*   No results for input(s): LIPASE, AMYLASE in the last 72 hours. CBC: No results for input(s): WBC, NEUTROABS, HGB, HCT, MCV, PLT in the last 72 hours. Cardiac Enzymes: No results for input(s): CKTOTAL, CKMB, CKMBINDEX, TROPONINI in the last 72 hours. BNP: Invalid input(s): POCBNP D-Dimer: No results for input(s): DDIMER in the last 72 hours. Hemoglobin A1C: No results for input(s): HGBA1C in the last 72 hours. Fasting Lipid Panel: No results for input(s): CHOL, HDL, LDLCALC, TRIG, CHOLHDL, LDLDIRECT in the last 72 hours. Thyroid Function Tests:  Recent Labs  11/28/14 2057  TSH 2.082   Anemia Panel: No results for input(s): VITAMINB12, FOLATE, FERRITIN, TIBC, IRON, RETICCTPCT in the last 72 hours.  RADIOLOGY: Dg Chest 1 View  11/24/2014   CLINICAL DATA:  Shortness of breath.  Urinary tract infection.  EXAM: CHEST - 1 VIEW  COMPARISON:  10/08/2014  FINDINGS: Patient is post median sternotomy. The cardiomediastinal contours are unchanged. Heart is at the upper limits of normal in size. No pulmonary edema. No consolidation to suggest pneumonia. No large pleural effusion. No pneumothorax. Chronic change of both shoulders again seen.  IMPRESSION: No acute pulmonary process.   Electronically Signed   By: Rubye OaksMelanie  Ehinger M.D.   On: 11/24/2014 23:11   Dg Abd 1  View  11/24/2014   CLINICAL DATA:  Complications of feeding tube.  EXAM: ABDOMEN -  1 VIEW  COMPARISON:  10/18/2014  FINDINGS: Gastrostomy tube tip and balloon the are localized to the left upper quadrant, consistent with location in the stomach. Positioning is similar to previous study. Normal bowel gas pattern. Degenerative changes in the spine and hips. Calcifications in the pelvis consistent with phleboliths.  IMPRESSION: Gastrostomy tube projects over the stomach.   Electronically Signed   By: Burman Nieves M.D.   On: 11/24/2014 23:07   Ct Head Wo Contrast  11/25/2014   CLINICAL DATA:  Acute onset of altered mental status. Initial encounter.  EXAM: CT HEAD WITHOUT CONTRAST  TECHNIQUE: Contiguous axial images were obtained from the base of the skull through the vertex without intravenous contrast.  COMPARISON:  CT of the head performed 10/19/2014  FINDINGS: There is no evidence of acute infarction, mass lesion, or intra- or extra-axial hemorrhage on CT.  A small focus of calcification at the right parietal lobe is stable from prior studies. Prominence of the ventricles suggests mild cortical volume loss, stable from prior studies; this is slightly out of proportion to sulcal prominence, without definite hydrocephalus. Scattered periventricular white matter change likely reflects small vessel ischemic microangiopathy.  The brainstem and fourth ventricle are within normal limits. The basal ganglia are unremarkable in appearance. The cerebral hemispheres demonstrate grossly normal gray-white differentiation. No mass effect or midline shift is seen.  There is no evidence of fracture; visualized osseous structures are unremarkable in appearance. The orbits are within normal limits. The paranasal sinuses and mastoid air cells are well-aerated. No significant soft tissue abnormalities are seen.  IMPRESSION: 1. No acute intracranial pathology seen on CT. 2. Mild ventriculomegaly again noted, slightly out of  proportion to sulcal prominence but stable from prior studies. This is thought to reflect mild cortical volume loss. 3. Small vessel ischemic microangiopathy noted.   Electronically Signed   By: Roanna Raider M.D.   On: 11/25/2014 20:44   US Abdomen Limited Ruq  11/25/2014   CLINICAL DATA:  Elevated liver enzymes.  EXAM: US ABDOMEN LIMITED - RIGHT UPPER QUADRANT  COMPARISON:  None.  FINDINGS: Gallbladder:  Minimal layering material in the gallbladder consistent with sludge. No stones. No gallbladder wall thickening or edema. Murphy's sign is negative.  Common bile duct:  Diameter: 5.2 mm, normal  Liver:  No focal lesion identified. Within normal limits in parenchymal echogenicity.  IMPRESSION: Minimal sludge in the gallbladder. No stones or inflammatory changes identified.   Electronically Signed   By: Burman Nieves M.D.   On: 11/25/2014 21:06      ASSESSMENT AND PLAN: 1. Arrhythmias with atrial fibrillation, non-sustained ventricular tachycardia, bradycardia, PAC's, and asymptomatic pauses: K noted to be mildly low at 3.4 today, and Mg low normal at 1.6 on 1/8, both of which could be contributing to nonsustained VT. Hemodynamically stable and asymptomatic. Will replace with KCl and magnesium. Not an anticoagulation candidate. Echocardiogram on 09/17/14 demonstrated normal LV systolic function and normal wall thickness with moderate mitral regurgitation. Will hold oral Lasix for now, as her BUN/SCr would indicate a prerenal state in the context of UTI with accompanying dehydration. Continue amiodarone 100 mg daily for now which was started on 1/8.  2. UTI: On levofloxacin. Will hold oral Lasix for now, as her BUN/SCr would indicate a prerenal state in the context of UTI with accompanying dehydration.  3. Essential HTN: Better controlled with increase of amlodipine to 10 mg daily.  4. CAD with CABG: Stable ischemic heart disease.    Prentice Docker, M.D.,  F.A.C.C.

## 2014-11-29 NOTE — Progress Notes (Signed)
Pt had a 10-beat run of PVCs at 0200. She also had a 16 beat run of PVCs and a 2.41 second pause around 0400. Then pt had a 6 beat run of PVCs at 0615. Pt has been between Claudean SeveranceVtach, Brady, Sinus Rhythm, and Irregular HR throughout the night. Lenny Pastelom Callahan, NP made aware. Pt has no c/o discomfort or chest pain. Will continue to monitor.

## 2014-11-30 DIAGNOSIS — I481 Persistent atrial fibrillation: Secondary | ICD-10-CM

## 2014-11-30 LAB — COMPREHENSIVE METABOLIC PANEL
ALK PHOS: 192 U/L — AB (ref 39–117)
ALT: 121 U/L — ABNORMAL HIGH (ref 0–35)
AST: 68 U/L — ABNORMAL HIGH (ref 0–37)
Albumin: 2.2 g/dL — ABNORMAL LOW (ref 3.5–5.2)
Anion gap: 7 (ref 5–15)
BILIRUBIN TOTAL: 0.4 mg/dL (ref 0.3–1.2)
BUN: 28 mg/dL — ABNORMAL HIGH (ref 6–23)
CHLORIDE: 106 meq/L (ref 96–112)
CO2: 23 mmol/L (ref 19–32)
Calcium: 8.7 mg/dL (ref 8.4–10.5)
Creatinine, Ser: 0.84 mg/dL (ref 0.50–1.10)
GFR calc Af Amer: 72 mL/min — ABNORMAL LOW (ref >90)
GFR calc non Af Amer: 62 mL/min — ABNORMAL LOW (ref >90)
GLUCOSE: 148 mg/dL — AB (ref 70–99)
POTASSIUM: 3.9 mmol/L (ref 3.5–5.1)
SODIUM: 136 mmol/L (ref 135–145)
Total Protein: 5.9 g/dL — ABNORMAL LOW (ref 6.0–8.3)

## 2014-11-30 LAB — GLUCOSE, CAPILLARY
GLUCOSE-CAPILLARY: 138 mg/dL — AB (ref 70–99)
GLUCOSE-CAPILLARY: 232 mg/dL — AB (ref 70–99)
GLUCOSE-CAPILLARY: 163 mg/dL — AB (ref 70–99)
Glucose-Capillary: 167 mg/dL — ABNORMAL HIGH (ref 70–99)
Glucose-Capillary: 103 mg/dL — ABNORMAL HIGH (ref 70–99)

## 2014-11-30 MED ORDER — LISINOPRIL 5 MG PO TABS
5.0000 mg | ORAL_TABLET | Freq: Every day | ORAL | Status: DC
  Administered 2014-11-30 – 2014-12-01 (×2): 5 mg via ORAL
  Filled 2014-11-30 (×2): qty 1

## 2014-11-30 MED ORDER — METOPROLOL TARTRATE 25 MG PO TABS
25.0000 mg | ORAL_TABLET | Freq: Three times a day (TID) | ORAL | Status: DC
  Administered 2014-11-30 – 2014-12-01 (×5): 25 mg
  Filled 2014-11-30 (×6): qty 1

## 2014-11-30 MED ORDER — HYDRALAZINE HCL 20 MG/ML IJ SOLN
10.0000 mg | Freq: Three times a day (TID) | INTRAMUSCULAR | Status: DC | PRN
  Administered 2014-11-30: 10 mg via INTRAVENOUS
  Filled 2014-11-30: qty 1

## 2014-11-30 NOTE — Progress Notes (Signed)
Notified MD Roda ShuttersXu of patients elevated BP. Orders received.

## 2014-11-30 NOTE — Progress Notes (Signed)
Patient Name: Susan Calhoun Date of Encounter: 11/30/2014  Principal Problem:   Acute encephalopathy Active Problems:   UTI (lower urinary tract infection)   DNR (do not resuscitate) discussion   Urinary tract infection   Elevated LFTs   Acute renal failure   Acute cystitis without hematuria   AKI (acute kidney injury)   Bradycardia   Complication of feeding tube   Length of Stay: 6  SUBJECTIVE  Calm, not very interactive, appears sedated. Telemetry shows persistent atrial fibrillation, mostly fairly controlled rate, except earlier this AM when rate was briefly up to 140 and several wide QRS beats were recorded.  CURRENT MEDS . allopurinol  100 mg Oral BID  . amantadine  100 mg Oral Daily  . amiodarone  100 mg Oral Daily  . amLODipine  10 mg Oral Daily  . antiseptic oral rinse  7 mL Mouth Rinse q12n4p  . aspirin  325 mg Per Tube Daily  . chlorhexidine  15 mL Mouth Rinse BID  . cholecalciferol  2,000 Units Oral Daily  . famotidine  20 mg Per Tube Daily  . feeding supplement (RESOURCE BREEZE)  1 Container Oral BID BM  . ferrous sulfate  325 mg Oral Q breakfast  . free water  300 mL Per Tube Q4H  . insulin aspart  0-9 Units Subcutaneous 6 times per day  . latanoprost  1 drop Right Eye QHS  . levofloxacin  250 mg Oral Daily  . lisinopril  5 mg Oral Daily  . metoCLOPramide  5 mg Per NG tube TID AC & HS  . vitamin B-12  1,000 mcg Oral Daily    OBJECTIVE   Intake/Output Summary (Last 24 hours) at 11/30/14 1059 Last data filed at 11/30/14 0942  Gross per 24 hour  Intake   1434 ml  Output      0 ml  Net   1434 ml   Filed Weights   11/28/14 0503 11/29/14 0320 11/30/14 0403  Weight: 137 lb 12.6 oz (62.5 kg) 132 lb 15 oz (60.3 kg) 134 lb 7.7 oz (61 kg)    PHYSICAL EXAM Filed Vitals:   11/30/14 0614 11/30/14 0740 11/30/14 0826 11/30/14 0936  BP: 181/69 174/62 126/61 153/60  Pulse:      Temp: 98 F (36.7 C)     TempSrc:      Resp: 18     Height:       Weight:      SpO2: 95%      General: obtunded Head: no evidence of trauma, PERRL, EOMI, no exophtalmos or lid lag, no myxedema, no xanthelasma; normal ears, nose and oropharynx Neck: normal jugular venous pulsations and no hepatojugular reflux; brisk carotid pulses without delay and no carotid bruits Chest: clear to auscultation, no signs of consolidation by percussion or palpation, normal fremitus, symmetrical and full respiratory excursions Cardiovascular: normal position and quality of the apical impulse, irregular rhythm, normal first and second heart sounds, no rubs or gallops, 2/6 holosystolic LLSB murmur Abdomen: no tenderness or distention, no masses by palpation, no abnormal pulsatility or arterial bruits, normal bowel sounds, no hepatosplenomegaly Extremities: no clubbing, cyanosis or edema; 2+ radial, ulnar and brachial pulses bilaterally; 2+ right femoral, posterior tibial and dorsalis pedis pulses; 2+ left femoral, posterior tibial and dorsalis pedis pulses; no subclavian or femoral bruits Neurological: grossly nonfocal  LABS  CBC No results for input(s): WBC, NEUTROABS, HGB, HCT, MCV, PLT in the last 72 hours. Basic Metabolic Panel  Recent Labs  11/28/14 2057  11/29/14 0524 11/30/14 0702  NA 139 138 136  K 3.5 3.4* 3.9  CL 109 107 106  CO2 21 22 23   GLUCOSE 109* 137* 148*  BUN 25* 24* 28*  CREATININE 0.82 0.90 0.84  CALCIUM 8.7 8.5 8.7  MG 1.6  --   --    Liver Function Tests  Recent Labs  11/29/14 0524 11/30/14 0702  AST 88* 68*  ALT 144* 121*  ALKPHOS 192* 192*  BILITOT 0.6 0.4  PROT 5.9* 5.9*  ALBUMIN 2.3* 2.2*   Thyroid Function Tests  Recent Labs  11/28/14 2057  TSH 2.082    Radiology Studies Imaging results have been reviewed and No results found.  TELE atrial fibrillation with usually fair ventricular rate control (90s), increased to 140s during early morning, when she also had several wide QRS beats, that likely represent aberrant  conduction, rather than PVCs.  ECG atrial fibrillation, baseline QRS has minor IVCD already (QRS 105 ms or so)  ASSESSMENT AND PLAN  I think this patient may have settled into permanent AF. The benefit of amiodarone is questionable, although it provides help with rate control. Personally, I have not seen convincing evidence of NSVT. If it was present, it happened when she had electrolyte abnormalities, was asymptomatic and the patient has minimal structural hear disease. Normal LVEF. Amiodarone is not indicated for the ventricular arrhythmia. On admission, the problem was actually bradycardia (while on 200 mg amiodarone  and also on clonidine, metoprolol and diltiazem - all since stopped).  LFTs are following a slow improving trend - may not be amiodarone toxicity. Nevertheless my threshold for amiodarone discontinuation is low. Add back lower dose metoprolol for better ventricular rate control.   Susan FairMihai Jola Critzer, MD, [facility] [phone] office [phone] pager 11/30/2014 10:59 AM

## 2014-11-30 NOTE — Progress Notes (Signed)
Reported to MD Roda ShuttersXu that patient had 6-10 beat runs of PVCs and PVCS are occurring more frequently. MD Roda ShuttersXu notified. Orders to write that patient is having more frequent PVCs on sticky note for cardiology. Will continue to monitor.

## 2014-11-30 NOTE — Progress Notes (Addendum)
PROGRESS NOTE  Susan Calhoun OEH:[id number] DOB: Sep 28, 1928 DOA: 11/24/2014 PCP: Pcp Not In System  HPI/Subjective: Susan Calhoun, an 81 yo [place] female, (who speaks some English) who comes in from SNF with UTI and altered mental status.  Hx of hemorrhagic stroke 09/01/14, after which she began having a mixture of good days and bad days with confusion, disjointed speech, talking to self, and hallucinations.  She had a UTI in 11/15 and presented the same way - lack of speech and inability to follow commands.  Patient mental status improving over stay, and speech more coherent when using [place] interpreter.  Chronic tremors.    Interval history:   Patient less talkative this am. Not able to provide much interval history. RN reported no bm since 1/5. Tele showed frequent pvc's,NSVT vs afib with aberrancy  Assessment/Plan:  Bradycardia/afib Acute this hospitalization.  Cardiology consulted on 1/6  Pulse rate in the 40s - 50s. 2-3 second pauses noted by RN on tele.  Appreciate cardiology input.  Amio changed to 100 mg. dilt d/ced. Cards restarted low dose Metoprolol on 1/10frequent pvc's,.NSVT vs afib with aberancy keep k>4, mag>2. H/o afib, not a candidate for anticoagulation due to h/o hemorrhagic stroke in 08/2014.  UTI UTI found in ED based on UA and microscopic (11/24/13) - many bacteria present, increased WBC Levofloxacin for hx of klebsiella resistant to cephalosporin Cultures show klebsiella and serratia - sensitive to levaquin.  Will continue levaquin for 7 days.  Altered mental status post hemorrhagic stroke Improving, worsening mental status than baseline likely due to uti/metabolic encephalopathy. H/o Hemorrhagic stroke 09/01/14, possibly exacerbated by UTI. Mental status improving daily likely at baseline now. Used [place] interpreter 1/7 - speech more coherent than when trying to speak English CT scan without acute abnormality. Per neuro, no further work up indicated at  this time.    Dysphagia  Now with intolerance of PEG feedings. Patient vomiting (1/8)   Will restart reglan.  Check residuals, and change to continuous feeds.   Elevated LFTS Transaminases and Alk phos were elevated. Bili was normal since admission.   Uncertain etiology. Likely acute infection. Pharmacy also recommended changed the protonix to pepcid on 1/7. LFTs trending down,   Hallucinations Per patient's granddaughter - these began after her hemorrhagic stroke Patient noted to have conversations with self  Psych consulted and recommended starting seroquel.  Given acute cardiac pauses and arrhythmias - we will not start this new medication at this time.  Tremors Unknown etiology, chronic Ativan PRN at nursing home for tremors. Will continue. Holding Reglan which can cause tardive dyskinesia.  DVT Prophylaxis: none because of hemorrhagic stroke in 10/15  Code Status: DNR Family Communication: patient's granddaughter via phone call   Disposition Plan: SNF (Inpatient, SNF, home with medically appropriate)  Consultants:  Neuro  Cardiology  Psych  Procedures:  none  Antibiotics: Anti-infectives    Start     Dose/Rate Route Frequency Ordered Stop   11/25/14 2200  cefTRIAXone (ROCEPHIN) 1 g in dextrose 5 % 50 mL IVPB - Premix  Status:  Discontinued     1 g100 mL/hr over 30 Minutes Intravenous Every 24 hours 11/25/14 0130 11/25/14 1052   11/25/14 1200  levofloxacin (LEVAQUIN) tablet 250 mg     250 mg Oral Daily 11/25/14 1158     11/24/14 2300  cefTRIAXone (ROCEPHIN) 1 g in dextrose 5 % 50 mL IVPB     1 g100 mL/hr over 30 Minutes Intravenous  Once 11/24/14 2254 11/24/14 2356  Objective: Filed Vitals:   11/30/14 3244 11/30/14 0740 11/30/14 0826 11/30/14 0936  BP: 181/69 174/62 126/61 153/60  Pulse:      Temp: 98 F (36.7 C)     TempSrc:      Resp: 18     Height:      Weight:      SpO2: 95%       Intake/Output Summary (Last 24 hours) at 11/30/14  1206 Last data filed at 11/30/14 0942  Gross per 24 hour  Intake   1434 ml  Output      0 ml  Net   1434 ml   Filed Weights   11/28/14 0503 11/29/14 0320 11/30/14 0403  Weight: 62.5 kg (137 lb 12.6 oz) 60.3 kg (132 lb 15 oz) 61 kg (134 lb 7.7 oz)    Exam: General: Well developed, NAD, appears stated age, lying in bed, speech is garbled but makes sense. HEENT:  Anicteic Sclera, MMM. Neck: Supple, no JVD, no masses  Cardiovascular: S1 S2 auscultated, no rubs, murmurs or gallops.   Respiratory: Clear to auscultation bilaterally with equal chest rise  Abdomen: Soft, nontender, nondistended, + bowel sounds , peg tube in place,currnently on continuous tube feeds. Extremities: warm dry without cyanosis clubbing or edema.  Skin: Without rashes exudates or nodules.      Data Reviewed: Basic Metabolic Panel:  Recent Labs Lab 11/26/14 0627 11/27/14 0800 11/28/14 2057 11/29/14 0524 11/30/14 0702  NA 140 139 139 138 136  K 4.1 3.9 3.5 3.4* 3.9  CL 111 110 109 107 106  CO2 20 21 21 22 23   GLUCOSE 90 136* 109* 137* 148*  BUN 38* 32* 25* 24* 28*  CREATININE 1.07 0.95 0.82 0.90 0.84  CALCIUM 8.3* 8.2* 8.7 8.5 8.7  MG  --   --  1.6  --   --    Liver Function Tests:  Recent Labs Lab 11/24/14 2136 11/26/14 0627 11/27/14 1200 11/29/14 0524 11/30/14 0702  AST 152* 99* 81* 88* 68*  ALT 233* 172* 148* 144* 121*  ALKPHOS 211* 158* 161* 192* 192*  BILITOT 0.5 0.6 0.9 0.6 0.4  PROT 6.7 5.9* 5.8* 5.9* 5.9*  ALBUMIN 2.8* 2.4* 2.4* 2.3* 2.2*    Recent Labs Lab 11/25/14 1516  AMMONIA 25   CBC:  Recent Labs Lab 11/24/14 2136  WBC 9.1  NEUTROABS 5.4  HGB 10.9*  HCT 34.0*  MCV 94.7  PLT 309   BNP (last 3 results)  Recent Labs  09/16/14 1730  PROBNP 9594.0*   CBG:  Recent Labs Lab 11/29/14 1556 11/29/14 2010 11/30/14 0002 11/30/14 0403 11/30/14 0840  GLUCAP 145* 152* 160* 138* 167*    Recent Results (from the past 240 hour(s))  Urine culture     Status:  None   Collection Time: 11/24/14 10:07 PM  Result Value Ref Range Status   Specimen Description URINE, CATHETERIZED  Final   Special Requests Normal  Final   Colony Count   Final    >=100,000 COLONIES/ML Performed at [facility]    Culture   Final    Oran Performed at [facility]    Report Status 11/28/2014 FINAL  Final   Organism ID, Bacteria KLEBSIELLA PNEUMONIAE  Final   Organism ID, Bacteria SERRATIA MARCESCENS  Final      Susceptibility   Klebsiella pneumoniae - MIC*    AMPICILLIN >=32 RESISTANT Resistant     CEFAZOLIN >=64 RESISTANT Resistant     CEFTRIAXONE  32 INTERMEDIATE Intermediate     CIPROFLOXACIN <=0.25 SENSITIVE Sensitive     GENTAMICIN >=16 RESISTANT Resistant     LEVOFLOXACIN <=0.12 SENSITIVE Sensitive     NITROFURANTOIN 64 INTERMEDIATE Intermediate     TOBRAMYCIN <=1 SENSITIVE Sensitive     TRIMETH/SULFA <=20 SENSITIVE Sensitive     PIP/TAZO 64 INTERMEDIATE Intermediate     * KLEBSIELLA PNEUMONIAE   Serratia marcescens - MIC*    CEFAZOLIN >=64 RESISTANT Resistant     CEFTRIAXONE <=1 SENSITIVE Sensitive     CIPROFLOXACIN <=0.25 SENSITIVE Sensitive     GENTAMICIN <=1 SENSITIVE Sensitive     LEVOFLOXACIN <=0.12 SENSITIVE Sensitive     NITROFURANTOIN 256 RESISTANT Resistant     TOBRAMYCIN 4 SENSITIVE Sensitive     TRIMETH/SULFA <=20 SENSITIVE Sensitive     * SERRATIA MARCESCENS  MRSA PCR Screening     Status: None   Collection Time: 11/25/14  2:00 AM  Result Value Ref Range Status   MRSA by PCR NEGATIVE NEGATIVE Final    Comment:        The GeneXpert MRSA Assay (FDA approved for NASAL specimens only), is one component of a comprehensive MRSA colonization surveillance program. It is not intended to diagnose MRSA infection nor to guide or monitor treatment for MRSA infections.      Studies: No results found.  Scheduled Meds: . allopurinol  100 mg Oral BID  . amantadine  100 mg Oral Daily   . amiodarone  100 mg Oral Daily  . amLODipine  10 mg Oral Daily  . antiseptic oral rinse  7 mL Mouth Rinse q12n4p  . aspirin  325 mg Per Tube Daily  . chlorhexidine  15 mL Mouth Rinse BID  . cholecalciferol  2,000 Units Oral Daily  . famotidine  20 mg Per Tube Daily  . feeding supplement (RESOURCE BREEZE)  1 Container Oral BID BM  . ferrous sulfate  325 mg Oral Q breakfast  . free water  300 mL Per Tube Q4H  . insulin aspart  0-9 Units Subcutaneous 6 times per day  . latanoprost  1 drop Right Eye QHS  . levofloxacin  250 mg Oral Daily  . lisinopril  5 mg Oral Daily  . metoCLOPramide  5 mg Per NG tube TID AC & HS  . metoprolol tartrate  25 mg Per Tube TID  . vitamin B-12  1,000 mcg Oral Daily   Continuous Infusions: . feeding supplement (VITAL AF 1.2 CAL) 1,000 mL (11/29/14 0438)    Principal Problem:   Acute encephalopathy Active Problems:   UTI (lower urinary tract infection)   DNR (do not resuscitate) discussion   Urinary tract infection   Elevated LFTs   Acute renal failure   Acute cystitis without hematuria   AKI (acute kidney injury)   Bradycardia   Complication of feeding tube   Florencia Reasons MD/PhD [facility] Pager [phone]. If 7PM-7AM, please contact night-coverage at [email or web address], password [facility] 11/30/2014, 12:06 PM  LOS: 6 days    BP elevated, low dose betablocker started. Tolerating continuous tube feeds, not n/v observed, consider change back to bolus feeds on Monday. Likely able to d/c tomorrow. If heart rate/bp better controlled.

## 2014-12-01 ENCOUNTER — Telehealth: Payer: Self-pay | Admitting: Neurology

## 2014-12-01 DIAGNOSIS — Z7189 Other specified counseling: Secondary | ICD-10-CM

## 2014-12-01 LAB — COMPREHENSIVE METABOLIC PANEL
ALT: 104 U/L — ABNORMAL HIGH (ref 0–35)
AST: 61 U/L — ABNORMAL HIGH (ref 0–37)
Albumin: 2.2 g/dL — ABNORMAL LOW (ref 3.5–5.2)
Alkaline Phosphatase: 182 U/L — ABNORMAL HIGH (ref 39–117)
Anion gap: 7 (ref 5–15)
BUN: 34 mg/dL — ABNORMAL HIGH (ref 6–23)
CO2: 23 mmol/L (ref 19–32)
CREATININE: 0.91 mg/dL (ref 0.50–1.10)
Calcium: 8.8 mg/dL (ref 8.4–10.5)
Chloride: 107 mEq/L (ref 96–112)
GFR calc Af Amer: 65 mL/min — ABNORMAL LOW (ref >90)
GFR, EST NON AFRICAN AMERICAN: 56 mL/min — AB (ref >90)
Glucose, Bld: 164 mg/dL — ABNORMAL HIGH (ref 70–99)
Potassium: 3.8 mmol/L (ref 3.5–5.1)
Sodium: 137 mmol/L (ref 135–145)
TOTAL PROTEIN: 5.8 g/dL — AB (ref 6.0–8.3)
Total Bilirubin: 0.5 mg/dL (ref 0.3–1.2)

## 2014-12-01 LAB — GLUCOSE, CAPILLARY
GLUCOSE-CAPILLARY: 125 mg/dL — AB (ref 70–99)
GLUCOSE-CAPILLARY: 142 mg/dL — AB (ref 70–99)
Glucose-Capillary: 147 mg/dL — ABNORMAL HIGH (ref 70–99)
Glucose-Capillary: 153 mg/dL — ABNORMAL HIGH (ref 70–99)
Glucose-Capillary: 133 mg/dL — ABNORMAL HIGH (ref 70–99)

## 2014-12-01 MED ORDER — METOCLOPRAMIDE HCL 5 MG PO TABS: 5.0000 mg | ORAL_TABLET | Freq: Three times a day (TID) | ORAL | Status: DC

## 2014-12-01 MED ORDER — METOPROLOL TARTRATE 25 MG PO TABS: 25.0000 mg | ORAL_TABLET | Freq: Three times a day (TID) | ORAL | Status: DC

## 2014-12-01 MED ORDER — LISINOPRIL 5 MG PO TABS: 5.0000 mg | ORAL_TABLET | Freq: Every day | ORAL | Status: DC

## 2014-12-01 NOTE — Telephone Encounter (Signed)
Pt daughter Maryln ManuelViviana Tillman called and states [facility] so we needed to cancel appt from 12-02-14

## 2014-12-01 NOTE — Progress Notes (Signed)
Patient Name: Susan Calhoun Date of Encounter: 12/01/2014     Principal Problem:   Acute encephalopathy Active Problems:   UTI (lower urinary tract infection)   DNR (do not resuscitate) discussion   Urinary tract infection   Elevated LFTs   Acute renal failure   Acute cystitis without hematuria   AKI (acute kidney injury)   Bradycardia   Complication of feeding tube    SUBJECTIVE  Calm, not very interactive, appears sedated.  CURRENT MEDS . allopurinol  100 mg Oral BID  . amantadine  100 mg Oral Daily  . amiodarone  100 mg Oral Daily  . amLODipine  10 mg Oral Daily  . antiseptic oral rinse  7 mL Mouth Rinse q12n4p  . aspirin  325 mg Per Tube Daily  . chlorhexidine  15 mL Mouth Rinse BID  . cholecalciferol  2,000 Units Oral Daily  . famotidine  20 mg Per Tube Daily  . feeding supplement (RESOURCE BREEZE)  1 Container Oral BID BM  . ferrous sulfate  325 mg Oral Q breakfast  . free water  300 mL Per Tube Q4H  . insulin aspart  0-9 Units Subcutaneous 6 times per day  . latanoprost  1 drop Right Eye QHS  . levofloxacin  250 mg Oral Daily  . lisinopril  5 mg Oral Daily  . metoCLOPramide  5 mg Per NG tube TID AC & HS  . metoprolol tartrate  25 mg Per Tube TID  . vitamin B-12  1,000 mcg Oral Daily    OBJECTIVE  Filed Vitals:   11/30/14 2308 12/01/14 0500 12/01/14 0606 12/01/14 0608  BP:   105/75   Pulse:    76  Temp: 98.6 F (37 C)  98.6 F (37 C)   TempSrc: Oral  Oral   Resp:   18   Height:      Weight:  135 lb 2.3 oz (61.3 kg)    SpO2:    99%    Intake/Output Summary (Last 24 hours) at 12/01/14 [id number] Last data filed at 12/01/14 0751  Gross per 24 hour  Intake   2247 ml  Output      0 ml  Net   2247 ml   Filed Weights   11/29/14 0320 11/30/14 0403 12/01/14 0500  Weight: 132 lb 15 oz (60.3 kg) 134 lb 7.7 oz (61 kg) 135 lb 2.3 oz (61.3 kg)    PHYSICAL EXAM  General: Well developed, NAD, appears stated age, laying in bed  HEENT: Anicteic  Sclera, MMM.  Neck: Supple, no JVD, no masses, proximal end of right clavicle slightly bruised and tender to palpation  Cardiovascular: S1 S2 auscultated, no murmurs heard  Respiratory: Clear to auscultation bilaterally Psych: pleasant, speaking gibberish mixed with some intelligible phrases. Accessory Clinical Findings  CBC No results for input(s): WBC, NEUTROABS, HGB, HCT, MCV, PLT in the last 72 hours. Basic Metabolic Panel  Recent Labs  11/28/14 2057 11/29/14 0524 11/30/14 0702  NA 139 138 136  K 3.5 3.4* 3.9  CL 109 107 106  CO2 21 22 23   GLUCOSE 109* 137* 148*  BUN 25* 24* 28*  CREATININE 0.82 0.90 0.84  CALCIUM 8.7 8.5 8.7  MG 1.6  --   --    Liver Function Tests  Recent Labs  11/29/14 0524 11/30/14 0702  AST 88* 68*  ALT 144* 121*  ALKPHOS 192* 192*  BILITOT 0.6 0.4  PROT 5.9* 5.9*  ALBUMIN 2.3* 2.2*    TELE  Atrial fibrillation with freq PVCs/ NSVT. Rate well controlled in 70s currently.    Radiology/Studies  Dg Chest 1 View  11/24/2014   CLINICAL DATA:  Shortness of breath.  Urinary tract infection.  EXAM: CHEST - 1 VIEW  COMPARISON:  10/08/2014  FINDINGS: Patient is post median sternotomy. The cardiomediastinal contours are unchanged. Heart is at the upper limits of normal in size. No pulmonary edema. No consolidation to suggest pneumonia. No large pleural effusion. No pneumothorax. Chronic change of both shoulders again seen.  IMPRESSION: No acute pulmonary process.   Electronically Signed   By: Rubye Oaks M.D.   On: 11/24/2014 23:11   Dg Abd 1 View  11/24/2014   CLINICAL DATA:  Complications of feeding tube.  EXAM: ABDOMEN - 1 VIEW  COMPARISON:  10/18/2014  FINDINGS: Gastrostomy tube tip and balloon the are localized to the left upper quadrant, consistent with location in the stomach. Positioning is similar to previous study. Normal bowel gas pattern. Degenerative changes in the spine and hips. Calcifications in the pelvis consistent with  phleboliths.  IMPRESSION: Gastrostomy tube projects over the stomach.   Electronically Signed   By: Burman Nieves M.D.   On: 11/24/2014 23:07   Ct Head Wo Contrast  11/25/2014   CLINICAL DATA:  Acute onset of altered mental status. Initial encounter.  EXAM: CT HEAD WITHOUT CONTRAST  TECHNIQUE: Contiguous axial images were obtained from the base of the skull through the vertex without intravenous contrast.  COMPARISON:  CT of the head performed 10/19/2014  FINDINGS: There is no evidence of acute infarction, mass lesion, or intra- or extra-axial hemorrhage on CT.  A small focus of calcification at the right parietal lobe is stable from prior studies. Prominence of the ventricles suggests mild cortical volume loss, stable from prior studies; this is slightly out of proportion to sulcal prominence, without definite hydrocephalus. Scattered periventricular white matter change likely reflects small vessel ischemic microangiopathy.  The brainstem and fourth ventricle are within normal limits. The basal ganglia are unremarkable in appearance. The cerebral hemispheres demonstrate grossly normal gray-white differentiation. No mass effect or midline shift is seen.  There is no evidence of fracture; visualized osseous structures are unremarkable in appearance. The orbits are within normal limits. The paranasal sinuses and mastoid air cells are well-aerated. No significant soft tissue abnormalities are seen.  IMPRESSION: 1. No acute intracranial pathology seen on CT. 2. Mild ventriculomegaly again noted, slightly out of proportion to sulcal prominence but stable from prior studies. This is thought to reflect mild cortical volume loss. 3. Small vessel ischemic microangiopathy noted.   Electronically Signed   By: Roanna Raider M.D.   On: 11/25/2014 20:44   US Abdomen Limited Ruq  11/25/2014   CLINICAL DATA:  Elevated liver enzymes.  EXAM: US ABDOMEN LIMITED - RIGHT UPPER QUADRANT  COMPARISON:  None.  FINDINGS: Gallbladder:   Minimal layering material in the gallbladder consistent with sludge. No stones. No gallbladder wall thickening or edema. Murphy's sign is negative.  Common bile duct:  Diameter: 5.2 mm, normal  Liver:  No focal lesion identified. Within normal limits in parenchymal echogenicity.  IMPRESSION: Minimal sludge in the gallbladder. No stones or inflammatory changes identified.   Electronically Signed   By: Burman Nieves M.D.   On: 11/25/2014 21:06    ASSESSMENT AND PLAN  Susan Calhoun is a 81 y.o. female with a history of CAD s/p CABG, PAF s/p ablation 04/2014, HTN, anemia, dysphagia on PEG tube feeding glaucoma, PVD s/p  carotid endarterectomy and recent hemorrhagic stroke in 08/2014 who presented with AMS and found to have UTI. Cardiology consulted on elevated transaminase and bradycardia in the setting of amiodarone therapy.   Bradycardia- now resolved. -- Amiodarone and metoprolol initially held. dilt d/ced.  Now resumed on  amiodarone and metoprolol  TID due to freq PVCs and some afib with RVR. NSVT vs afib with aberancy keep k>4, mag>2.  -- Per Dr. Salena Saner yesterday " I think this patient may have settled into permanent AF. The benefit of amiodarone is questionable, although it provides help with rate control. Personally, I have not seen convincing evidence of NSVT. If it was present, it happened when she had electrolyte abnormalities, was asymptomatic and the patient has minimal structural hear disease. Normal LVEF. Amiodarone is not indicated for the ventricular arrhythmia. On admission, the problem was actually bradycardia (while on 200 mg amiodarone and also on clonidine, metoprolol and diltiazem - all since stopped)."  Elevated LFTs:  -- Agree with holding statin. Not thought to be due to [facility] and restarted at a lower dose ( ) -- AST 68/ ALT 121, which is improved from 152/233  A-fib, she remains in atrial fib. Rates well controlled now -- CHA2DS2-Vasc score 7 (age, female, HTN,  DM, stroke) but not a candidate for anticoagulation given recent hemorrhagic stroke  Hypertension- Bp better controlled. Continue Metoprolol  TID, amlodipine  qd, lisinopril   Chronic diastolic CHF- ECHO on 09/17/14 demonstrated normal LV systolic function and normal wall thickness with moderate mitral regurgitation.  -- Dr. Purvis Sheffield discontinued oral lasix on 11/29/14 as her BUN/SCr would indicated a prerenal state in the context of UTI with accompanying dehydration. -- No s/s volume overload currently.   Dysphagia on PEG tube feeding  History of CAD status post CABG -- No signs of angina  History of PVD status post carotid endarterectomy  Recent hemorrhagic stroke in October 2015  AMS related to UTI, dementia and polypharmacy  Signed, Janetta Hora PA-C  Pager [phone]

## 2014-12-01 NOTE — Clinical Social Work Note (Signed)
Per MD [facility] RN, patient/family Trilby Leaver([name] notified at bedside, Monique notified by phone), and facility notified of patient's DC. RN given number for report. DC packet on patient's chart. Ambulance transport requested for patient for 5:30pm. CSW signing off at this time.   Roddie McBryant Reda Citron MSW, [place]WA, [place], [phone]

## 2014-12-01 NOTE — Discharge Summary (Signed)
Discharge Summary  Susan Calhoun ZOX:[id number] DOB: 08/07/1928  PCP: Pcp Not In System  Admit date: 11/24/2014 Discharge date: 12/01/2014  Time spent: >55mns  Recommendations for Outpatient Follow-up:  1. PCP/neurology/cardiology  Discharge Diagnoses:  Active Hospital Problems   Diagnosis Date Noted  . Acute encephalopathy 09/01/2014  . Complication of feeding tube   . Bradycardia 11/26/2014  . Urinary tract infection 11/25/2014  . Elevated LFTs 11/25/2014  . Acute renal failure 11/25/2014  . Acute cystitis without hematuria   . AKI (acute kidney injury)   . DNR (do not resuscitate) discussion 10/20/2014  . UTI (lower urinary tract infection) 10/08/2014    Resolved Hospital Problems   Diagnosis Date Noted Date Resolved  No resolved problems to display.    Discharge Condition: stable  Diet recommendation: resume tube feeds at previous setting, on dysphagia diet, recommend swallow eval in a week, aspiration precaution  Filed Weights   11/29/14 0320 11/30/14 0403 12/01/14 0500  Weight: 60.3 kg (132 lb 15 oz) 61 kg (134 lb 7.7 oz) 61.3 kg (135 lb 2.3 oz)    History of present illness:  [name] a 81y.o. female, nursing home resident with a past medical history significant for atrial fibrillation , diabetes mellitus and dysphagia on tube feeding sent for evaluation for fever and decreased mentation patient has history of dementia and recent hemorrhagic stroke. There was concern about possible vaginal discharge as well , in the nursing home. In the emergency room the patient was found to have a urinary tract infection with renal insufficiency and I was asked to admit. The patient does not look toxic and had a temperature of 99 9.   Hospital Course:  Principal Problem:   Acute encephalopathy Active Problems:   UTI (lower urinary tract infection)   DNR (do not resuscitate) discussion   Urinary tract infection   Elevated LFTs   Acute renal failure   Acute  cystitis without hematuria   AKI (acute kidney injury)   Bradycardia   Complication of feeding tube  Bradycardia/afib Acute this hospitalization. Cardiology consulted on 1/6  Pulse rate in the 40s - 50s. 2-3 second pauses noted by RN on tele.  Appreciate cardiology input. Amio changed to 100 mg. dilt d/ced. Cards restarted low dose Metoprolol on 1/10frequent pvc's,.NSVT vs afib with aberancy keep k>4, mag>2. H/o afib, not a candidate for anticoagulation due to h/o hemorrhagic stroke in 08/2014.  UTI UTI found in ED based on UA and microscopic (11/24/13) - many bacteria present, increased WBC Levofloxacin for hx of klebsiella resistant to cephalosporin Cultures show klebsiella and serratia - sensitive to levaquin. finished levaquin treatment  Altered mental status post hemorrhagic stroke Improving, worsening mental status than baseline likely due to uti/metabolic encephalopathy. H/o Hemorrhagic stroke 09/01/14, possibly exacerbated by UTI. Mental status improving daily likely at baseline now. Used [place]pre[place] 1/7 - speech more coherent than when trying to speak English CT scan without acute abnormality. Per neuro, no further work up indicated at this time.   Dysphagia  Patient was started on continuous tube feeds due to vomiting, improved since reglan restarted, she is go back to bolus tube feeds. With reglan.  On aspiration precaution, swallow eval periodically   Elevated LFTS Transaminases and Alk phos were elevated. Bili was normal since admission.  Uncertain etiology. Likely acute infection. Pharmacy also recommended changed the protonix to pepcid on 1/7. LFTs trending down,   Hallucinations Per patient's granddaughter - these began after her hemorrhagic stroke Patient noted to  have conversations with self  Psych consulted and recommended starting seroquel. Given acute cardiac pauses and arrhythmias - we will not start this new medication at this  time.  Tremors Unknown etiology, chronic Ativan PRN at nursing home for tremors. Will continue. Holding Reglan which can cause tardive dyskinesia.  DVT Prophylaxis: none because of hemorrhagic stroke in 10/15  Procedures:  none  Consultations:  cardiology  Discharge Exam: BP 133/60 mmHg  Pulse 76  Temp(Src) 98.9 F (37.2 C) (Oral)  Resp 20  Ht _0  (1.499 m)  Wt 61.3 kg (135 lb 2.3 oz)  BMI 27.28 kg/m2  SpO2 99%  General: dementia, NAD Cardiovascular: afib Respiratory: clear  Discharge Instructions You were cared for by a hospitalist during your hospital stay. If you have any questions about your discharge medications or the care you received while you were in the hospital after you are discharged, you can call the unit and asked to speak with the hospitalist on call if the hospitalist that took care of you is not available. Once you are discharged, your primary care physician will handle any further medical issues. Please note that NO REFILLS for any discharge medications will be authorized once you are discharged, as it is imperative that you return to your primary care physician (or establish a relationship with a primary care physician if you do not have one) for your aftercare needs so that they can reassess your need for medications and monitor your lab values.  Discharge Instructions    Diet - low sodium heart healthy    Complete by:  As directed      Increase activity slowly    Complete by:  As directed             Medication List    STOP taking these medications        acetaminophen 500 MG tablet  Commonly known as:  TYLENOL     atorvastatin 80 MG tablet  Commonly known as:  LIPITOR     cloNIDine 0.1 MG tablet  Commonly known as:  CATAPRES     diltiazem 30 MG tablet  Commonly known as:  CARDIZEM     metoprolol tartrate 25 mg/10 mL Susp  Commonly known as:  LOPRESSOR  Replaced by:  metoprolol tartrate 25 MG tablet     pantoprazole 40 MG tablet   Commonly known as:  PROTONIX     pantoprazole sodium 40 mg/20 mL Pack  Commonly known as:  PROTONIX      TAKE these medications        allopurinol 100 MG tablet  Commonly known as:  ZYLOPRIM  Take 100 mg by mouth 2 (two) times daily.     amantadine 50 MG/5ML solution  Commonly known as:  SYMMETREL  Place 10 mLs (100 mg total) into feeding tube 2 (two) times daily.     amiodarone 100 MG tablet  Commonly known as:  PACERONE  Place 1 tablet (100 mg total) into feeding tube daily.     amLODipine 10 MG tablet  Commonly known as:  NORVASC  Place 1 tablet (10 mg total) into feeding tube daily.     aspirin 325 MG tablet  Place 1 tablet (325 mg total) into feeding tube daily.     famotidine 40 MG/5ML suspension  Commonly known as:  PEPCID  Place 2.5 mLs (20 mg total) into feeding tube daily.     feeding supplement (VITAL AF 1.2 CAL) Liqd  Place 270 mLs into feeding  tube 4 (four) times daily - after meals and at bedtime.     feeding supplement (ENSURE COMPLETE) Liqd  Take 237 mLs by mouth 3 (three) times daily with meals.     feeding supplement (VITAL AF 1.2 CAL) Liqd  Place 280 mLs into feeding tube 4 (four) times daily.     ferrous sulfate 325 (65 FE) MG tablet  Take 325 mg by mouth daily with breakfast.     free water Soln  Place 300 mLs into feeding tube every 4 (four) hours.     furosemide 20 MG tablet  Commonly known as:  LASIX  Take 20 mg by mouth daily.     insulin aspart 100 UNIT/ML injection  Commonly known as:  novoLOG  Inject 0-5 Units into the skin at bedtime.     insulin aspart 100 UNIT/ML injection  Commonly known as:  novoLOG  Inject 0-9 Units into the skin 3 (three) times daily with meals.     lisinopril 5 MG tablet  Commonly known as:  PRINIVIL,ZESTRIL  Take 1 tablet (5 mg total) by mouth daily.     metoCLOPramide 5 MG tablet  Commonly known as:  REGLAN  Take 1 tablet (5 mg total) by mouth 3 (three) times daily before meals.     metoprolol  tartrate 25 MG tablet  Commonly known as:  LOPRESSOR  Place 1 tablet (25 mg total) into feeding tube 3 (three) times daily.     Travoprost (BAK Free) 0.004 % Soln ophthalmic solution  Commonly known as:  TRAVATAN  Place 1 drop into the right eye at bedtime.     VITAMIN B-12 PO  Take 1 tablet by mouth daily. 1050m     Vitamin D3 2000 UNITS Tabs  Take 1 tablet by mouth daily. Per tube       Allergies  Allergen Reactions  . Septra [Sulfamethoxazole-Trimethoprim] Hives       Follow-up Information    Follow up with ACameron Sprang MD In 1 week.   Specialty:  Neurology   Contact information:   [phone]      Follow up with Pcp Not In System In 2 weeks.      Follow up with TSueanne Margarita MD In 2 weeks.   Specialty:  Cardiology   Contact information:   [id number] [id number] [phone]       The results of significant diagnostics from this hospitalization (including imaging, microbiology, ancillary and laboratory) are listed below for reference.    Significant Diagnostic Studies: Dg Chest 1 View  11/24/2014   CLINICAL DATA:  Shortness of breath.  Urinary tract infection.  EXAM: CHEST - 1 VIEW  COMPARISON:  10/08/2014  FINDINGS: Patient is post median sternotomy. The cardiomediastinal contours are unchanged. Heart is at the upper limits of normal in size. No pulmonary edema. No consolidation to suggest pneumonia. No large pleural effusion. No pneumothorax. Chronic change of both shoulders again seen.  IMPRESSION: No acute pulmonary process.   Electronically Signed   By: MJeb LeveringM.D.   On: 11/24/2014 23:11   Dg Abd 1 View  11/24/2014   CLINICAL DATA:  Complications of feeding tube.  EXAM: ABDOMEN - 1 VIEW  COMPARISON:  10/18/2014  FINDINGS: Gastrostomy tube tip and balloon the are localized to the left upper quadrant, consistent with location in the stomach. Positioning is similar to  previous study. Normal bowel gas pattern.  Degenerative changes in the spine and hips. Calcifications in the pelvis consistent with phleboliths.  IMPRESSION: Gastrostomy tube projects over the stomach.   Electronically Signed   By: Lucienne Capers M.D.   On: 11/24/2014 23:07   Ct Head Wo Contrast  11/25/2014   CLINICAL DATA:  Acute onset of altered mental status. Initial encounter.  EXAM: CT HEAD WITHOUT CONTRAST  TECHNIQUE: Contiguous axial images were obtained from the base of the skull through the vertex without intravenous contrast.  COMPARISON:  CT of the head performed 10/19/2014  FINDINGS: There is no evidence of acute infarction, mass lesion, or intra- or extra-axial hemorrhage on CT.  A small focus of calcification at the right parietal lobe is stable from prior studies. Prominence of the ventricles suggests mild cortical volume loss, stable from prior studies; this is slightly out of proportion to sulcal prominence, without definite hydrocephalus. Scattered periventricular white matter change likely reflects small vessel ischemic microangiopathy.  The brainstem and fourth ventricle are within normal limits. The basal ganglia are unremarkable in appearance. The cerebral hemispheres demonstrate grossly normal gray-white differentiation. No mass effect or midline shift is seen.  There is no evidence of fracture; visualized osseous structures are unremarkable in appearance. The orbits are within normal limits. The paranasal sinuses and mastoid air cells are well-aerated. No significant soft tissue abnormalities are seen.  IMPRESSION: 1. No acute intracranial pathology seen on CT. 2. Mild ventriculomegaly again noted, slightly out of proportion to sulcal prominence but stable from prior studies. This is thought to reflect mild cortical volume loss. 3. Small vessel ischemic microangiopathy noted.   Electronically Signed   By: Garald Balding M.D.   On: 11/25/2014 20:44   US Abdomen Limited Ruq  11/25/2014    CLINICAL DATA:  Elevated liver enzymes.  EXAM: US ABDOMEN LIMITED - RIGHT UPPER QUADRANT  COMPARISON:  None.  FINDINGS: Gallbladder:  Minimal layering material in the gallbladder consistent with sludge. No stones. No gallbladder wall thickening or edema. Murphy's sign is negative.  Common bile duct:  Diameter: 5.2 mm, normal  Liver:  No focal lesion identified. Within normal limits in parenchymal echogenicity.  IMPRESSION: Minimal sludge in the gallbladder. No stones or inflammatory changes identified.   Electronically Signed   By: Lucienne Capers M.D.   On: 11/25/2014 21:06    Microbiology: Recent Results (from the past 240 hour(s))  Urine culture     Status: None   Collection Time: 11/24/14 10:07 PM  Result Value Ref Range Status   Specimen Description URINE, CATHETERIZED  Final   Special Requests Normal  Final   Colony Count   Final    >=100,000 COLONIES/ML Performed at [facility] Performed at [facility]    Report Status 11/28/2014 FINAL  Final   Organism ID, Bacteria KLEBSIELLA PNEUMONIAE  Final   Organism ID, Bacteria SERRATIA MARCESCENS  Final      Susceptibility   Klebsiella pneumoniae - MIC*    AMPICILLIN >=32 RESISTANT Resistant     CEFAZOLIN >=64 RESISTANT Resistant     CEFTRIAXONE 32 INTERMEDIATE Intermediate     CIPROFLOXACIN <=0.25 SENSITIVE Sensitive     GENTAMICIN >=16 RESISTANT Resistant     LEVOFLOXACIN <=0.12 SENSITIVE Sensitive     NITROFURANTOIN 64 INTERMEDIATE Intermediate     TOBRAMYCIN <=1 SENSITIVE Sensitive     TRIMETH/SULFA <=20 SENSITIVE Sensitive     PIP/TAZO 64 INTERMEDIATE Intermediate     *  KLEBSIELLA PNEUMONIAE   Serratia marcescens - MIC*    CEFAZOLIN >=64 RESISTANT Resistant     CEFTRIAXONE <=1 SENSITIVE Sensitive     CIPROFLOXACIN <=0.25 SENSITIVE Sensitive     GENTAMICIN <=1 SENSITIVE Sensitive     LEVOFLOXACIN <=0.12 SENSITIVE Sensitive     NITROFURANTOIN  256 RESISTANT Resistant     TOBRAMYCIN 4 SENSITIVE Sensitive     TRIMETH/SULFA <=20 SENSITIVE Sensitive     * SERRATIA MARCESCENS  MRSA PCR Screening     Status: None   Collection Time: 11/25/14  2:00 AM  Result Value Ref Range Status   MRSA by PCR NEGATIVE NEGATIVE Final    Comment:        The GeneXpert MRSA Assay (FDA approved for NASAL specimens only), is one component of a comprehensive MRSA colonization surveillance program. It is not intended to diagnose MRSA infection nor to guide or monitor treatment for MRSA infections.      Labs: Basic Metabolic Panel:  Recent Labs Lab 11/27/14 0800 11/28/14 2057 11/29/14 0524 11/30/14 0702 12/01/14 0820  NA 139 139 138 136 137  K 3.9 3.5 3.4* 3.9 3.8  CL 110 109 107 106 107  CO2 _0 GLUCOSE 136* 109* 137* 148* 164*  BUN 32* 25* 24* 28* 34*  CREATININE 0.95 0.82 0.90 0.84 0.91  CALCIUM 8.2* 8.7 8.5 8.7 8.8  MG  --  1.6  --   --   --    Liver Function Tests:  Recent Labs Lab 11/26/14 0627 11/27/14 1200 11/29/14 0524 11/30/14 0702 12/01/14 0820  AST 99* 81* 88* 68* 61*  ALT 172* 148* 144* 121* 104*  ALKPHOS 158* 161* 192* 192* 182*  BILITOT 0.6 0.9 0.6 0.4 0.5  PROT 5.9* 5.8* 5.9* 5.9* 5.8*  ALBUMIN 2.4* 2.4* 2.3* 2.2* 2.2*   No results for input(s): LIPASE, AMYLASE in the last 168 hours.  Recent Labs Lab 11/25/14 1516  AMMONIA 25   CBC:  Recent Labs Lab 11/24/14 2136  WBC 9.1  NEUTROABS 5.4  HGB 10.9*  HCT 34.0*  MCV 94.7  PLT 309   Cardiac Enzymes: No results for input(s): CKTOTAL, CKMB, CKMBINDEX, TROPONINI in the last 168 hours. BNP: BNP (last 3 results)  Recent Labs  09/16/14 1730  PROBNP 9594.0*   CBG:  Recent Labs Lab 12/01/14 0040 12/01/14 0439 12/01/14 0811 12/01/14 1157 12/01/14 1616  GLUCAP 142* 147* 153* 125* 133*       Signed:  Janelis Stelzer  [facility] 12/01/2014, 4:22 PM

## 2014-12-01 NOTE — Progress Notes (Signed)
Patient was discharged to nursing home [facility] by MD order; discharged instructions  review and sent to facility with care notes; IV DIC; PEG in place, patent, dressing intact, clean, dry; skin - pressure ulcer on sacral area with foam dressing; scab on right ankle; facility was called and report was given to the nurse patient will be transported to facility via EMS.

## 2014-12-02 ENCOUNTER — Ambulatory Visit: Payer: PRIVATE HEALTH INSURANCE | Admitting: Neurology

## 2014-12-25 ENCOUNTER — Ambulatory Visit: Payer: Self-pay | Admitting: Neurology

## 2015-01-01 ENCOUNTER — Encounter: Payer: Self-pay | Admitting: Neurology

## 2015-02-23 ENCOUNTER — Encounter (HOSPITAL_BASED_OUTPATIENT_CLINIC_OR_DEPARTMENT_OTHER): Payer: PPO | Attending: Plastic Surgery

## 2015-02-23 DIAGNOSIS — Z8673 Personal history of transient ischemic attack (TIA), and cerebral infarction without residual deficits: Secondary | ICD-10-CM | POA: Insufficient documentation

## 2015-02-23 DIAGNOSIS — I10 Essential (primary) hypertension: Secondary | ICD-10-CM | POA: Insufficient documentation

## 2015-02-23 DIAGNOSIS — Z794 Long term (current) use of insulin: Secondary | ICD-10-CM | POA: Insufficient documentation

## 2015-02-23 DIAGNOSIS — K219 Gastro-esophageal reflux disease without esophagitis: Secondary | ICD-10-CM | POA: Insufficient documentation

## 2015-02-23 DIAGNOSIS — I4891 Unspecified atrial fibrillation: Secondary | ICD-10-CM | POA: Insufficient documentation

## 2015-02-23 DIAGNOSIS — Z931 Gastrostomy status: Secondary | ICD-10-CM | POA: Insufficient documentation

## 2015-02-23 DIAGNOSIS — L89623 Pressure ulcer of left heel, stage 3: Secondary | ICD-10-CM | POA: Insufficient documentation

## 2015-02-23 DIAGNOSIS — L89613 Pressure ulcer of right heel, stage 3: Secondary | ICD-10-CM | POA: Insufficient documentation

## 2015-02-23 DIAGNOSIS — L89154 Pressure ulcer of sacral region, stage 4: Secondary | ICD-10-CM | POA: Insufficient documentation

## 2015-02-23 DIAGNOSIS — Z87891 Personal history of nicotine dependence: Secondary | ICD-10-CM | POA: Insufficient documentation

## 2015-02-23 DIAGNOSIS — I2510 Atherosclerotic heart disease of native coronary artery without angina pectoris: Secondary | ICD-10-CM | POA: Insufficient documentation

## 2015-02-23 DIAGNOSIS — E119 Type 2 diabetes mellitus without complications: Secondary | ICD-10-CM | POA: Insufficient documentation

## 2015-02-26 DIAGNOSIS — Z87891 Personal history of nicotine dependence: Secondary | ICD-10-CM | POA: Diagnosis not present

## 2015-02-26 DIAGNOSIS — K219 Gastro-esophageal reflux disease without esophagitis: Secondary | ICD-10-CM | POA: Diagnosis not present

## 2015-02-26 DIAGNOSIS — I2510 Atherosclerotic heart disease of native coronary artery without angina pectoris: Secondary | ICD-10-CM | POA: Diagnosis not present

## 2015-02-26 DIAGNOSIS — Z931 Gastrostomy status: Secondary | ICD-10-CM | POA: Diagnosis not present

## 2015-02-26 DIAGNOSIS — Z794 Long term (current) use of insulin: Secondary | ICD-10-CM | POA: Diagnosis not present

## 2015-02-26 DIAGNOSIS — E119 Type 2 diabetes mellitus without complications: Secondary | ICD-10-CM | POA: Diagnosis not present

## 2015-02-26 DIAGNOSIS — Z8673 Personal history of transient ischemic attack (TIA), and cerebral infarction without residual deficits: Secondary | ICD-10-CM | POA: Diagnosis not present

## 2015-02-26 DIAGNOSIS — L89613 Pressure ulcer of right heel, stage 3: Secondary | ICD-10-CM | POA: Diagnosis not present

## 2015-02-26 DIAGNOSIS — I10 Essential (primary) hypertension: Secondary | ICD-10-CM | POA: Diagnosis not present

## 2015-02-26 DIAGNOSIS — I4891 Unspecified atrial fibrillation: Secondary | ICD-10-CM | POA: Diagnosis not present

## 2015-02-26 DIAGNOSIS — L89623 Pressure ulcer of left heel, stage 3: Secondary | ICD-10-CM | POA: Diagnosis not present

## 2015-02-26 DIAGNOSIS — L89154 Pressure ulcer of sacral region, stage 4: Secondary | ICD-10-CM | POA: Diagnosis not present

## 2015-03-04 ENCOUNTER — Inpatient Hospital Stay (HOSPITAL_COMMUNITY)
Admission: EM | Admit: 2015-03-04 | Discharge: 2015-03-10 | DRG: 252 | Disposition: A | Discharge status: Skilled Nursing Facility | Payer: PPO | Attending: Internal Medicine | Admitting: Internal Medicine

## 2015-03-04 ENCOUNTER — Encounter (HOSPITAL_COMMUNITY): Payer: Self-pay | Admitting: Emergency Medicine

## 2015-03-04 DIAGNOSIS — B9620 Unspecified Escherichia coli [E. coli] as the cause of diseases classified elsewhere: Secondary | ICD-10-CM | POA: Diagnosis present

## 2015-03-04 DIAGNOSIS — R0600 Dyspnea, unspecified: Secondary | ICD-10-CM

## 2015-03-04 DIAGNOSIS — N390 Urinary tract infection, site not specified: Secondary | ICD-10-CM | POA: Diagnosis present

## 2015-03-04 DIAGNOSIS — R4701 Aphasia: Secondary | ICD-10-CM | POA: Diagnosis present

## 2015-03-04 DIAGNOSIS — R1314 Dysphagia, pharyngoesophageal phase: Secondary | ICD-10-CM | POA: Diagnosis present

## 2015-03-04 DIAGNOSIS — M79604 Pain in right leg: Secondary | ICD-10-CM | POA: Diagnosis not present

## 2015-03-04 DIAGNOSIS — L89623 Pressure ulcer of left heel, stage 3: Secondary | ICD-10-CM | POA: Diagnosis present

## 2015-03-04 DIAGNOSIS — I70201 Unspecified atherosclerosis of native arteries of extremities, right leg: Secondary | ICD-10-CM | POA: Diagnosis present

## 2015-03-04 DIAGNOSIS — I10 Essential (primary) hypertension: Secondary | ICD-10-CM | POA: Diagnosis present

## 2015-03-04 DIAGNOSIS — I82409 Acute embolism and thrombosis of unspecified deep veins of unspecified lower extremity: Secondary | ICD-10-CM | POA: Diagnosis present

## 2015-03-04 DIAGNOSIS — L89154 Pressure ulcer of sacral region, stage 4: Secondary | ICD-10-CM | POA: Diagnosis present

## 2015-03-04 DIAGNOSIS — E46 Unspecified protein-calorie malnutrition: Secondary | ICD-10-CM | POA: Diagnosis present

## 2015-03-04 DIAGNOSIS — I82402 Acute embolism and thrombosis of unspecified deep veins of left lower extremity: Secondary | ICD-10-CM

## 2015-03-04 DIAGNOSIS — I82412 Acute embolism and thrombosis of left femoral vein: Secondary | ICD-10-CM | POA: Diagnosis present

## 2015-03-04 DIAGNOSIS — I9589 Other hypotension: Secondary | ICD-10-CM | POA: Diagnosis present

## 2015-03-04 DIAGNOSIS — R64 Cachexia: Secondary | ICD-10-CM | POA: Diagnosis present

## 2015-03-04 DIAGNOSIS — Z96651 Presence of right artificial knee joint: Secondary | ICD-10-CM | POA: Diagnosis present

## 2015-03-04 DIAGNOSIS — Z8673 Personal history of transient ischemic attack (TIA), and cerebral infarction without residual deficits: Secondary | ICD-10-CM

## 2015-03-04 DIAGNOSIS — I82411 Acute embolism and thrombosis of right femoral vein: Secondary | ICD-10-CM | POA: Diagnosis not present

## 2015-03-04 DIAGNOSIS — E1142 Type 2 diabetes mellitus with diabetic polyneuropathy: Secondary | ICD-10-CM | POA: Diagnosis present

## 2015-03-04 DIAGNOSIS — I619 Nontraumatic intracerebral hemorrhage, unspecified: Secondary | ICD-10-CM | POA: Diagnosis present

## 2015-03-04 DIAGNOSIS — Z7401 Bed confinement status: Secondary | ICD-10-CM

## 2015-03-04 DIAGNOSIS — I4891 Unspecified atrial fibrillation: Secondary | ICD-10-CM | POA: Diagnosis present

## 2015-03-04 DIAGNOSIS — Z66 Do not resuscitate: Secondary | ICD-10-CM | POA: Diagnosis present

## 2015-03-04 DIAGNOSIS — Z79899 Other long term (current) drug therapy: Secondary | ICD-10-CM

## 2015-03-04 DIAGNOSIS — L89613 Pressure ulcer of right heel, stage 3: Secondary | ICD-10-CM | POA: Diagnosis present

## 2015-03-04 DIAGNOSIS — Z794 Long term (current) use of insulin: Secondary | ICD-10-CM

## 2015-03-04 DIAGNOSIS — H409 Unspecified glaucoma: Secondary | ICD-10-CM | POA: Diagnosis present

## 2015-03-04 DIAGNOSIS — L89603 Pressure ulcer of unspecified heel, stage 3: Secondary | ICD-10-CM | POA: Diagnosis present

## 2015-03-04 DIAGNOSIS — Z7982 Long term (current) use of aspirin: Secondary | ICD-10-CM

## 2015-03-04 DIAGNOSIS — I82401 Acute embolism and thrombosis of unspecified deep veins of right lower extremity: Secondary | ICD-10-CM

## 2015-03-04 NOTE — ED Notes (Signed)
[facility] of a DVT on her left femoral artery.

## 2015-03-04 NOTE — ED Notes (Signed)
PA Lawyer at bedside.  

## 2015-03-04 NOTE — ED Notes (Signed)
Pt noted to have pressure sores to bilateral heels. Sores have been dressed and covered by SNF. Per family, pt also has pressure sores to buttock region. Pt has PEG tube and Foley catheter.

## 2015-03-05 DIAGNOSIS — R4701 Aphasia: Secondary | ICD-10-CM | POA: Diagnosis present

## 2015-03-05 DIAGNOSIS — R1314 Dysphagia, pharyngoesophageal phase: Secondary | ICD-10-CM | POA: Diagnosis present

## 2015-03-05 DIAGNOSIS — R319 Hematuria, unspecified: Secondary | ICD-10-CM

## 2015-03-05 DIAGNOSIS — L89613 Pressure ulcer of right heel, stage 3: Secondary | ICD-10-CM | POA: Diagnosis present

## 2015-03-05 DIAGNOSIS — I82403 Acute embolism and thrombosis of unspecified deep veins of lower extremity, bilateral: Secondary | ICD-10-CM | POA: Diagnosis not present

## 2015-03-05 DIAGNOSIS — I70201 Unspecified atherosclerosis of native arteries of extremities, right leg: Secondary | ICD-10-CM | POA: Diagnosis present

## 2015-03-05 DIAGNOSIS — I82402 Acute embolism and thrombosis of unspecified deep veins of left lower extremity: Secondary | ICD-10-CM

## 2015-03-05 DIAGNOSIS — L89623 Pressure ulcer of left heel, stage 3: Secondary | ICD-10-CM | POA: Diagnosis present

## 2015-03-05 DIAGNOSIS — I4891 Unspecified atrial fibrillation: Secondary | ICD-10-CM | POA: Diagnosis not present

## 2015-03-05 DIAGNOSIS — E46 Unspecified protein-calorie malnutrition: Secondary | ICD-10-CM

## 2015-03-05 DIAGNOSIS — L89603 Pressure ulcer of unspecified heel, stage 3: Secondary | ICD-10-CM | POA: Diagnosis present

## 2015-03-05 DIAGNOSIS — E1142 Type 2 diabetes mellitus with diabetic polyneuropathy: Secondary | ICD-10-CM

## 2015-03-05 DIAGNOSIS — I82411 Acute embolism and thrombosis of right femoral vein: Secondary | ICD-10-CM | POA: Diagnosis present

## 2015-03-05 DIAGNOSIS — N390 Urinary tract infection, site not specified: Secondary | ICD-10-CM

## 2015-03-05 DIAGNOSIS — M79604 Pain in right leg: Secondary | ICD-10-CM | POA: Diagnosis present

## 2015-03-05 DIAGNOSIS — Z8673 Personal history of transient ischemic attack (TIA), and cerebral infarction without residual deficits: Secondary | ICD-10-CM | POA: Diagnosis not present

## 2015-03-05 DIAGNOSIS — I743 Embolism and thrombosis of arteries of the lower extremities: Secondary | ICD-10-CM

## 2015-03-05 DIAGNOSIS — I82409 Acute embolism and thrombosis of unspecified deep veins of unspecified lower extremity: Secondary | ICD-10-CM | POA: Diagnosis present

## 2015-03-05 DIAGNOSIS — Z66 Do not resuscitate: Secondary | ICD-10-CM | POA: Diagnosis present

## 2015-03-05 DIAGNOSIS — Z7401 Bed confinement status: Secondary | ICD-10-CM | POA: Diagnosis not present

## 2015-03-05 DIAGNOSIS — I9589 Other hypotension: Secondary | ICD-10-CM | POA: Diagnosis present

## 2015-03-05 DIAGNOSIS — L89154 Pressure ulcer of sacral region, stage 4: Secondary | ICD-10-CM

## 2015-03-05 DIAGNOSIS — I618 Other nontraumatic intracerebral hemorrhage: Secondary | ICD-10-CM

## 2015-03-05 DIAGNOSIS — I10 Essential (primary) hypertension: Secondary | ICD-10-CM | POA: Diagnosis present

## 2015-03-05 DIAGNOSIS — Z79899 Other long term (current) drug therapy: Secondary | ICD-10-CM | POA: Diagnosis not present

## 2015-03-05 DIAGNOSIS — H409 Unspecified glaucoma: Secondary | ICD-10-CM | POA: Diagnosis present

## 2015-03-05 DIAGNOSIS — I82412 Acute embolism and thrombosis of left femoral vein: Secondary | ICD-10-CM | POA: Diagnosis present

## 2015-03-05 DIAGNOSIS — Z794 Long term (current) use of insulin: Secondary | ICD-10-CM | POA: Diagnosis not present

## 2015-03-05 DIAGNOSIS — R64 Cachexia: Secondary | ICD-10-CM | POA: Diagnosis present

## 2015-03-05 DIAGNOSIS — E119 Type 2 diabetes mellitus without complications: Secondary | ICD-10-CM | POA: Insufficient documentation

## 2015-03-05 DIAGNOSIS — Z7982 Long term (current) use of aspirin: Secondary | ICD-10-CM | POA: Diagnosis not present

## 2015-03-05 DIAGNOSIS — B9620 Unspecified Escherichia coli [E. coli] as the cause of diseases classified elsewhere: Secondary | ICD-10-CM | POA: Diagnosis present

## 2015-03-05 DIAGNOSIS — Z96651 Presence of right artificial knee joint: Secondary | ICD-10-CM | POA: Diagnosis present

## 2015-03-05 LAB — GLUCOSE, CAPILLARY
GLUCOSE-CAPILLARY: 125 mg/dL — AB (ref 70–99)
GLUCOSE-CAPILLARY: 98 mg/dL (ref 70–99)
Glucose-Capillary: 83 mg/dL (ref 70–99)
Glucose-Capillary: 101 mg/dL — ABNORMAL HIGH (ref 70–99)
Glucose-Capillary: 144 mg/dL — ABNORMAL HIGH (ref 70–99)
Glucose-Capillary: 135 mg/dL — ABNORMAL HIGH (ref 70–99)

## 2015-03-05 LAB — URINALYSIS, ROUTINE W REFLEX MICROSCOPIC
BILIRUBIN URINE: NEGATIVE
Bilirubin Urine: NEGATIVE
Glucose, UA: NEGATIVE mg/dL
Glucose, UA: NEGATIVE mg/dL
Ketones, ur: NEGATIVE mg/dL
Ketones, ur: NEGATIVE mg/dL
Nitrite: POSITIVE — AB
Nitrite: POSITIVE — AB
PROTEIN: 100 mg/dL — AB
Protein, ur: 100 mg/dL — AB
Specific Gravity, Urine: 1.017 (ref 1.005–1.030)
Specific Gravity, Urine: 1.017 (ref 1.005–1.030)
UROBILINOGEN UA: 1 mg/dL (ref 0.0–1.0)
Urobilinogen, UA: 1 mg/dL (ref 0.0–1.0)
pH: 5 (ref 5.0–8.0)
pH: 5.5 (ref 5.0–8.0)

## 2015-03-05 LAB — CBC
HCT: 28.5 % — ABNORMAL LOW (ref 36.0–46.0)
HEMOGLOBIN: 8.5 g/dL — AB (ref 12.0–15.0)
MCH: 28.1 pg (ref 26.0–34.0)
MCHC: 29.8 g/dL — ABNORMAL LOW (ref 30.0–36.0)
MCV: 94.1 fL (ref 78.0–100.0)
Platelets: 322 10*3/uL (ref 150–400)
RBC: 3.03 MIL/uL — AB (ref 3.87–5.11)
RDW: 17.9 % — ABNORMAL HIGH (ref 11.5–15.5)
WBC: 10.6 10*3/uL — ABNORMAL HIGH (ref 4.0–10.5)

## 2015-03-05 LAB — CBC WITH DIFFERENTIAL/PLATELET
Basophils Absolute: 0 10*3/uL (ref 0.0–0.1)
Basophils Relative: 0 % (ref 0–1)
Eosinophils Absolute: 0.2 10*3/uL (ref 0.0–0.7)
Eosinophils Relative: 2 % (ref 0–5)
HCT: 28.9 % — ABNORMAL LOW (ref 36.0–46.0)
Hemoglobin: 8.7 g/dL — ABNORMAL LOW (ref 12.0–15.0)
Lymphocytes Relative: 23 % (ref 12–46)
Lymphs Abs: 2.3 10*3/uL (ref 0.7–4.0)
MCH: 28.2 pg (ref 26.0–34.0)
MCHC: 30.1 g/dL (ref 30.0–36.0)
MCV: 93.8 fL (ref 78.0–100.0)
Monocytes Absolute: 0.9 10*3/uL (ref 0.1–1.0)
Monocytes Relative: 9 % (ref 3–12)
Neutro Abs: 6.8 10*3/uL (ref 1.7–7.7)
Neutrophils Relative %: 66 % (ref 43–77)
Platelets: 322 10*3/uL (ref 150–400)
RBC: 3.08 MIL/uL — ABNORMAL LOW (ref 3.87–5.11)
RDW: 17.7 % — ABNORMAL HIGH (ref 11.5–15.5)
WBC: 10.3 10*3/uL (ref 4.0–10.5)

## 2015-03-05 LAB — BASIC METABOLIC PANEL
Anion gap: 10 (ref 5–15)
Anion gap: 12 (ref 5–15)
BUN: 67 mg/dL — ABNORMAL HIGH (ref 6–23)
BUN: 64 mg/dL — AB (ref 6–23)
CO2: 28 mmol/L (ref 19–32)
CO2: 26 mmol/L (ref 19–32)
Calcium: 8.7 mg/dL (ref 8.4–10.5)
Calcium: 8.6 mg/dL (ref 8.4–10.5)
Chloride: 104 mmol/L (ref 96–112)
Chloride: 103 mmol/L (ref 96–112)
Creatinine, Ser: 1.37 mg/dL — ABNORMAL HIGH (ref 0.50–1.10)
Creatinine, Ser: 1.3 mg/dL — ABNORMAL HIGH (ref 0.50–1.10)
GFR calc Af Amer: 40 mL/min — ABNORMAL LOW (ref >90)
GFR calc non Af Amer: 34 mL/min — ABNORMAL LOW (ref >90)
GFR, EST AFRICAN AMERICAN: 42 mL/min — AB (ref >90)
GFR, EST NON AFRICAN AMERICAN: 36 mL/min — AB (ref >90)
Glucose, Bld: 95 mg/dL (ref 70–99)
Glucose, Bld: 97 mg/dL (ref 70–99)
Potassium: 4.4 mmol/L (ref 3.5–5.1)
Potassium: 4.3 mmol/L (ref 3.5–5.1)
Sodium: 142 mmol/L (ref 135–145)
Sodium: 141 mmol/L (ref 135–145)

## 2015-03-05 LAB — URINE MICROSCOPIC-ADD ON

## 2015-03-05 LAB — HEPARIN LEVEL (UNFRACTIONATED): HEPARIN UNFRACTIONATED: 0.47 [IU]/mL (ref 0.30–0.70)

## 2015-03-05 MED ORDER — SODIUM CHLORIDE 0.9 % IJ SOLN
3.0000 mL | Freq: Two times a day (BID) | INTRAMUSCULAR | Status: DC
  Administered 2015-03-05 – 2015-03-08 (×3): 3 mL via INTRAVENOUS

## 2015-03-05 MED ORDER — LISINOPRIL 5 MG PO TABS
5.0000 mg | ORAL_TABLET | Freq: Every day | ORAL | Status: DC
  Administered 2015-03-06 – 2015-03-10 (×5): 5 mg via ORAL
  Filled 2015-03-05 (×6): qty 1

## 2015-03-05 MED ORDER — TRAMADOL HCL 50 MG PO TABS: 100.0000 mg | ORAL_TABLET | Freq: Two times a day (BID) | ORAL | Status: DC | PRN

## 2015-03-05 MED ORDER — ADULT MULTIVITAMIN LIQUID CH
5.0000 mL | Freq: Every day | ORAL | Status: DC
  Administered 2015-03-05 – 2015-03-10 (×5): 5 mL
  Filled 2015-03-05 (×6): qty 5

## 2015-03-05 MED ORDER — ALLOPURINOL 100 MG PO TABS
100.0000 mg | ORAL_TABLET | Freq: Two times a day (BID) | ORAL | Status: DC
  Administered 2015-03-05 – 2015-03-10 (×11): 100 mg via ORAL
  Filled 2015-03-05 (×12): qty 1

## 2015-03-05 MED ORDER — MORPHINE SULFATE (CONCENTRATE) 10 MG/0.5ML PO SOLN: 5.0000 mg | Freq: Every day | ORAL | Status: DC

## 2015-03-05 MED ORDER — PREGABALIN 25 MG PO CAPS
25.0000 mg | ORAL_CAPSULE | Freq: Every day | ORAL | Status: DC
  Administered 2015-03-05 – 2015-03-10 (×5): 25 mg via ORAL
  Filled 2015-03-05 (×5): qty 1

## 2015-03-05 MED ORDER — SODIUM CHLORIDE 0.9 % IJ SOLN: 3.0000 mL | INTRAMUSCULAR | Status: DC | PRN

## 2015-03-05 MED ORDER — ONDANSETRON HCL 4 MG/2ML IJ SOLN: 4.0000 mg | Freq: Four times a day (QID) | INTRAMUSCULAR | Status: DC | PRN

## 2015-03-05 MED ORDER — MORPHINE SULFATE (CONCENTRATE) 10 MG /0.5 ML PO SOLN
5.0000 mg | Freq: Every day | ORAL | Status: DC
  Filled 2015-03-05: qty 0.5

## 2015-03-05 MED ORDER — GLUCERNA 1.2 CAL PO LIQD
237.0000 mL | Freq: Three times a day (TID) | ORAL | Status: DC
  Administered 2015-03-05 – 2015-03-10 (×15): 237 mL
  Filled 2015-03-05 (×21): qty 237

## 2015-03-05 MED ORDER — FERROUS SULFATE 300 (60 FE) MG/5ML PO SYRP
300.0000 mg | ORAL_SOLUTION | Freq: Every day | ORAL | Status: DC
  Administered 2015-03-05 – 2015-03-10 (×6): 300 mg
  Filled 2015-03-05 (×6): qty 5

## 2015-03-05 MED ORDER — MORPHINE SULFATE 10 MG/5ML PO SOLN: 5.0000 mg | Freq: Every day | ORAL | Status: DC

## 2015-03-05 MED ORDER — METOPROLOL TARTRATE 25 MG PO TABS
25.0000 mg | ORAL_TABLET | Freq: Three times a day (TID) | ORAL | Status: DC
  Administered 2015-03-05 (×2): 25 mg
  Filled 2015-03-05 (×6): qty 1

## 2015-03-05 MED ORDER — CEFTRIAXONE SODIUM IN DEXTROSE 20 MG/ML IV SOLN
1.0000 g | INTRAVENOUS | Status: DC
  Administered 2015-03-05 – 2015-03-07 (×3): 1 g via INTRAVENOUS
  Filled 2015-03-05 (×4): qty 50

## 2015-03-05 MED ORDER — MEDIHONEY WOUND/BURN DRESSING EX GEL: 1.0000 "application " | Freq: Every day | CUTANEOUS | Status: DC

## 2015-03-05 MED ORDER — VITAMIN B-12 1000 MCG PO TABS
1000.0000 ug | ORAL_TABLET | Freq: Every day | ORAL | Status: DC
  Administered 2015-03-05 – 2015-03-10 (×6): 1000 ug
  Filled 2015-03-05 (×7): qty 1

## 2015-03-05 MED ORDER — DECUBI-VITE PO CAPS: 1.0000 | ORAL_CAPSULE | Freq: Every day | ORAL | Status: DC

## 2015-03-05 MED ORDER — AMLODIPINE BESYLATE 10 MG PO TABS
10.0000 mg | ORAL_TABLET | Freq: Every day | ORAL | Status: DC
  Administered 2015-03-06 – 2015-03-09 (×3): 10 mg
  Filled 2015-03-05 (×5): qty 1

## 2015-03-05 MED ORDER — METOCLOPRAMIDE HCL 5 MG/5ML PO SOLN
5.0000 mg | Freq: Three times a day (TID) | ORAL | Status: DC
  Administered 2015-03-05 – 2015-03-10 (×20): 5 mg
  Filled 2015-03-05 (×25): qty 5

## 2015-03-05 MED ORDER — VITAMIN D3 25 MCG (1000 UNIT) PO TABS
2000.0000 [IU] | ORAL_TABLET | Freq: Every day | ORAL | Status: DC
  Administered 2015-03-05 – 2015-03-10 (×5): 2000 [IU]
  Filled 2015-03-05 (×6): qty 2

## 2015-03-05 MED ORDER — SODIUM CHLORIDE 0.9 % IV SOLN: 250.0000 mL | INTRAVENOUS | Status: DC | PRN

## 2015-03-05 MED ORDER — AMIODARONE HCL 100 MG PO TABS
100.0000 mg | ORAL_TABLET | Freq: Every day | ORAL | Status: DC
  Administered 2015-03-05 – 2015-03-10 (×6): 100 mg
  Filled 2015-03-05 (×6): qty 1

## 2015-03-05 MED ORDER — OXYCODONE HCL 5 MG PO TABS
5.0000 mg | ORAL_TABLET | ORAL | Status: DC | PRN
  Administered 2015-03-06 – 2015-03-10 (×4): 5 mg via ORAL
  Filled 2015-03-05 (×4): qty 1

## 2015-03-05 MED ORDER — ASPIRIN 325 MG PO TABS
325.0000 mg | ORAL_TABLET | Freq: Every day | ORAL | Status: DC
  Administered 2015-03-05 – 2015-03-10 (×6): 325 mg
  Filled 2015-03-05 (×6): qty 1

## 2015-03-05 MED ORDER — PRO-STAT SUGAR FREE PO LIQD: 30.0000 mL | Freq: Three times a day (TID) | ORAL | Status: DC

## 2015-03-05 MED ORDER — INSULIN ASPART 100 UNIT/ML ~~LOC~~ SOLN
0.0000 [IU] | SUBCUTANEOUS | Status: DC
  Administered 2015-03-05 – 2015-03-07 (×5): 1 [IU] via SUBCUTANEOUS
  Administered 2015-03-09: 2 [IU] via SUBCUTANEOUS
  Administered 2015-03-10: 1 [IU] via SUBCUTANEOUS

## 2015-03-05 MED ORDER — LATANOPROST 0.005 % OP SOLN
1.0000 [drp] | Freq: Every day | OPHTHALMIC | Status: DC
  Administered 2015-03-05 – 2015-03-09 (×4): 1 [drp] via OPHTHALMIC
  Filled 2015-03-05 (×2): qty 2.5

## 2015-03-05 MED ORDER — CRANBERRY 450 MG PO TABS: 450.0000 mg | ORAL_TABLET | Freq: Two times a day (BID) | ORAL | Status: DC

## 2015-03-05 MED ORDER — PRO-STAT SUGAR FREE PO LIQD
30.0000 mL | Freq: Three times a day (TID) | ORAL | Status: DC
  Administered 2015-03-05 – 2015-03-10 (×14): 30 mL
  Filled 2015-03-05 (×17): qty 30

## 2015-03-05 MED ORDER — ACETAMINOPHEN 325 MG PO TABS
650.0000 mg | ORAL_TABLET | Freq: Four times a day (QID) | ORAL | Status: DC | PRN
  Administered 2015-03-05 – 2015-03-06 (×2): 650 mg via ORAL
  Filled 2015-03-05 (×2): qty 2

## 2015-03-05 MED ORDER — ALUM & MAG HYDROXIDE-SIMETH 200-200-20 MG/5ML PO SUSP: 30.0000 mL | Freq: Four times a day (QID) | ORAL | Status: DC | PRN

## 2015-03-05 MED ORDER — FAMOTIDINE 40 MG/5ML PO SUSR
20.0000 mg | Freq: Every day | ORAL | Status: DC
  Administered 2015-03-05 – 2015-03-10 (×6): 20 mg
  Filled 2015-03-05 (×6): qty 2.5

## 2015-03-05 MED ORDER — HEPARIN (PORCINE) IN NACL 100-0.45 UNIT/ML-% IJ SOLN
900.0000 [IU]/h | INTRAMUSCULAR | Status: DC
  Administered 2015-03-05: 900 [IU]/h via INTRAVENOUS
  Filled 2015-03-05: qty 250

## 2015-03-05 MED ORDER — SODIUM CHLORIDE 0.9 % IJ SOLN
3.0000 mL | Freq: Two times a day (BID) | INTRAMUSCULAR | Status: DC
  Administered 2015-03-05 – 2015-03-10 (×6): 3 mL via INTRAVENOUS

## 2015-03-05 MED ORDER — ACETAMINOPHEN 650 MG RE SUPP: 650.0000 mg | Freq: Four times a day (QID) | RECTAL | Status: DC | PRN

## 2015-03-05 MED ORDER — FREE WATER
300.0000 mL | Status: DC
  Administered 2015-03-05 – 2015-03-08 (×21): 300 mL

## 2015-03-05 MED ORDER — HYDROMORPHONE HCL 1 MG/ML IJ SOLN: 0.5000 mg | INTRAMUSCULAR | Status: DC | PRN

## 2015-03-05 MED ORDER — FUROSEMIDE 20 MG PO TABS
20.0000 mg | ORAL_TABLET | Freq: Every day | ORAL | Status: DC
  Filled 2015-03-05 (×2): qty 1

## 2015-03-05 MED ORDER — MORPHINE SULFATE (CONCENTRATE) 10 MG/0.5ML PO SOLN
5.0000 mg | Freq: Every day | ORAL | Status: DC
Start: 2015-03-05 — End: 2015-03-10
  Administered 2015-03-05 – 2015-03-10 (×5): 5 mg
  Filled 2015-03-05 (×5): qty 0.5

## 2015-03-05 MED ORDER — AMANTADINE HCL 50 MG/5ML PO SYRP
100.0000 mg | ORAL_SOLUTION | Freq: Two times a day (BID) | ORAL | Status: DC
  Administered 2015-03-05 – 2015-03-10 (×10): 100 mg
  Filled 2015-03-05 (×13): qty 10

## 2015-03-05 MED ORDER — PRO-STAT SUGAR FREE PO LIQD
30.0000 mL | Freq: Three times a day (TID) | ORAL | Status: DC
  Administered 2015-03-05 (×2): 30 mL via ORAL
  Filled 2015-03-05 (×4): qty 30

## 2015-03-05 MED ORDER — ONDANSETRON HCL 4 MG PO TABS: 4.0000 mg | ORAL_TABLET | Freq: Four times a day (QID) | ORAL | Status: DC | PRN

## 2015-03-05 NOTE — Progress Notes (Signed)
Dr. Blake DivineAkula notified of sustained HR of 125. Will continue to monitor closely.

## 2015-03-05 NOTE — ED Notes (Signed)
Phlebotomy at bedside.

## 2015-03-05 NOTE — Progress Notes (Signed)
STAT lower extremity venous duplex has been performed.   Preliminary results = Non-occlusive DVT noted in the Right common femoral vein and Left common femoral vein/ saphenofemoral junction. Superficial thrombosis is noted in the left greater saphenous vein.   Farrel DemarkJill Eunice, RDMS, RVT 03/05/2015

## 2015-03-05 NOTE — Progress Notes (Signed)
ANTICOAGULATION CONSULT NOTE - Initial Consult  Pharmacy Consult for heparin Indication: DVT  Allergies  Allergen Reactions  . Septra [Sulfamethoxazole-Trimethoprim] Hives    Patient Measurements: Height: 4\' 11"  (149.9 cm) Weight: 135 lb 2.3 oz (61.3 kg) IBW/kg (Calculated) : 43.2 Heparin Dosing Weight: 60kg  Vital Signs: Temp: 98.9 F (37.2 C) (04/13 2115) Temp Source: Oral (04/13 2115) BP: 118/53 mmHg (04/14 0130) Pulse Rate: 76 (04/14 0130)  Labs:  Recent Labs  03/05/15 0111  HGB 8.7*  HCT 28.9*  PLT 322    Medical History: Past Medical History  Diagnosis Date  . Diabetes mellitus with diabetic polyneuropathy   . A-fib   . Renal disorder   . Hypertension   . Anemia   . Glaucoma     right eye   . Carotid artery stenosis   . PVD (peripheral vascular disease)      Assessment: 81yo female brought to ED from NH for evaluation for DVT, was initially going to start NOAC but had brain hemorrhage in October 2015 on Coumadin, awaiting neuro consult to determine best anticoag plans vs IVC filter, to begin heparin.  Goal of Therapy:  Heparin level 0.3-0.7 units/ml Monitor platelets by anticoagulation protocol: Yes   Plan:  Will begin heparin gtt at 900 units/hr (without bolus given Hgb and PMH) and monitor heparin levels and CBC.  Vernard GamblesVeronda Calynn Ferrero, PharmD, BCPS  03/05/2015,1:44 AM

## 2015-03-05 NOTE — Progress Notes (Signed)
INITIAL NUTRITION ASSESSMENT  DOCUMENTATION CODES Per approved criteria  -Not Applicable   INTERVENTION: Continue Glucerna 1.2 formula TID via tube Prostat liquid protein 30 ml TID via tube Continue liquid MVI daily RD to follow for nutrition care plan  NUTRITION DIAGNOSIS: Increased nutrient needs related to wound healing as evidenced by estimated nutrition needs  Goal: Pt to meet >/= 90% of their estimated nutrition needs   Monitor:  PO & supplemental intake, weight, labs, I/O's  Reason for Assessment: Consult, Low Braden  81 y.o. female  Admitting Dx: DVT (deep venous thrombosis)  ASSESSMENT: 81 y.o. female resident of the [facility] with a history of an intracranial hemorrhage due to coumadin rx for atrial fibrillation which occurred in 08/2014 who was sent to the ED due to results of facility outpatient venous doppler studies that revealed of the LLE DVT and occlusion of the right femoral artery.   RD spoke with patient's daughter at bedside.  Pt takes PO diet (Dys 1, thin liquids) and receives supplemental TF via PEG tube (Glucerna 1.2 formula 237 ml TID with Prostat liquid protein 30 ml TID).  Total TF regimen provides 1153 kcals, 88 gm protein.  Daughter reports pt's PO intake is variable each day.  CWOCN note reviewed.  Nutrient needs increased given several wounds.  Low braden score places patient at risk for further skin breakdown.  No muscle or subcutaneous fat depletion noticed.  Height: Ht Readings from Last 1 Encounters:  03/05/15  (1.499 m)    Weight: Wt Readings from Last 1 Encounters:  03/05/15 135 lb 2.3 oz (61.3 kg)    Ideal Body Weight: 44 kg  % Ideal Body Weight: 137%  Wt Readings from Last 10 Encounters:  03/05/15 135 lb 2.3 oz (61.3 kg)  12/01/14 135 lb 2.3 oz (61.3 kg)  10/21/14 144 lb 6.4 oz (65.5 kg)  09/15/14 146 lb 2.6 oz (66.3 kg)  08/31/14 140 lb (63.504 kg)    Usual Body Weight: 144 lb  % Usual Body Weight:  94%  BMI:  Body mass index is 27.28 kg/(m^2).  Estimated Nutritional Needs: Kcal: 1500-1700 Protein: 90-100 gm Fluid: per MD  Skin:  Stage IV sacral wound Stage III L heel wound Stage III R heel wound Stage I L outer foot wound  Diet Order: DIET - DYS 1 Room service appropriate?: Yes; Fluid consistency:: Thin  EDUCATION NEEDS: -No education needs identified at this time   Intake/Output Summary (Last 24 hours) at 03/05/15 1422 Last data filed at 03/05/15 1300  Gross per 24 hour  Intake    360 ml  Output    300 ml  Net     60 ml    Labs:   Recent Labs Lab 03/05/15 0111 03/05/15 0438  NA 142 141  K 4.4 4.3  CL 104 103  CO2 28 26  BUN 67* 64*  CREATININE 1.37* 1.30*  CALCIUM 8.7 8.6  GLUCOSE 95 97    CBG (last 3)   Recent Labs  03/05/15 0630 03/05/15 0826 03/05/15 1143  GLUCAP 101* 125* 98    Scheduled Meds: . allopurinol  100 mg Oral BID  . amantadine  100 mg Per Tube BID  . amiodarone  100 mg Per Tube Daily  . amLODipine  10 mg Per Tube Daily  . aspirin  325 mg Per Tube Daily  . cholecalciferol  2,000 Units Per Tube Daily  . famotidine  20 mg Per Tube Daily  . feeding supplement (GLUCERNA 1.2 CAL)  237 mL Per Tube TID  . feeding supplement (PRO-STAT SUGAR FREE 64)  30 mL Oral TID WC  . ferrous sulfate  300 mg Per Tube Daily  . free water  300 mL Per Tube Q4H  . furosemide  20 mg Per Tube Daily  . insulin aspart  0-9 Units Subcutaneous 6 times per day  . latanoprost  1 drop Right Eye QHS  . lisinopril  5 mg Oral Daily  . metoCLOPramide  5 mg Per Tube TID AC & HS  . metoprolol tartrate  25 mg Per Tube TID  . morphine  5 mg Per Tube Daily  . multivitamin  5 mL Per Tube Daily  . pregabalin  25 mg Oral Daily  . sodium chloride  3 mL Intravenous Q12H  . sodium chloride  3 mL Intravenous Q12H  . vitamin B-12  1,000 mcg Per Tube Daily    Continuous Infusions:   Past Medical History  Diagnosis Date  . Diabetes mellitus with diabetic  polyneuropathy   . A-fib   . Renal disorder   . Hypertension   . Anemia   . Glaucoma     right eye   . Carotid artery stenosis   . PVD (peripheral vascular disease)     Past Surgical History  Procedure Laterality Date  . Replacement total knee Right   . Carotid endarterectomy  2015  . Coronary artery bypass graft    . Ablation      cardiac for arrthymia     Maureen ChattersKatie Nicolasa Milbrath, RD, LDN Pager #: [phone] After-Hours Pager #: [phone]

## 2015-03-05 NOTE — ED Provider Notes (Signed)
CSN: [id number]     Arrival date & time 03/04/15  2054 History   First MD Initiated Contact with Patient 03/04/15 2059     Chief Complaint  Patient presents with  . DVT     (Consider location/radiation/quality/duration/timing/severity/associated sxs/prior Treatment) HPI Patient presents to the emergency department from a nursing facility for DVT and vascular insufficiency of the right leg.  The patient cannot give me any history and Dr. Eula Listen sent her in for further evaluation from neurology and vascular surgery and whether she needs anticoagulation versus an IVC filter. Past Medical History  Diagnosis Date  . Diabetes mellitus with diabetic polyneuropathy   . A-fib   . Renal disorder   . Hypertension   . Anemia   . Glaucoma     right eye   . Carotid artery stenosis   . PVD (peripheral vascular disease)    Past Surgical History  Procedure Laterality Date  . Replacement total knee Right   . Carotid endarterectomy  2015  . Coronary artery bypass graft    . Ablation      cardiac for arrthymia    History reviewed. No pertinent family history. History  Substance Use Topics  . Smoking status: Never Smoker   . Smokeless tobacco: Never Used  . Alcohol Use: No   OB History    No data available     Review of Systems  Level V caveat applies due to dementia  Allergies  Septra  Home Medications   Prior to Admission medications   Medication Sig Start Date End Date Taking? Authorizing Provider  allopurinol (ZYLOPRIM) 100 MG tablet Take 100 mg by mouth 2 (two) times daily.    Yes Historical Provider, MD  amantadine (SYMMETREL) 50 MG/5ML solution Place 10 mLs (100 mg total) into feeding tube 2 (two) times daily. 10/21/14  Yes Catarina Hartshorn, MD  Amino Acids-Protein Hydrolys (FEEDING SUPPLEMENT, PRO-STAT SUGAR FREE 64,) LIQD Take 30 mLs by mouth 3 (three) times daily with meals.   Yes Historical Provider, MD  amiodarone (PACERONE) 100 MG tablet Place 1 tablet (100 mg total) into  feeding tube daily. 11/28/14  Yes Marianne L York, PA-C  amLODipine (NORVASC) 10 MG tablet Place 1 tablet (10 mg total) into feeding tube daily. 11/29/14  Yes Tora Kindred York, PA-C  aspirin 325 MG tablet Place 1 tablet (325 mg total) into feeding tube daily. 10/21/14  Yes Catarina Hartshorn, MD  Cholecalciferol (VITAMIN D3) 2000 UNITS TABS Give 2,000 Units by tube daily.    Yes Historical Provider, MD  COLLAGEN MATRIX-SILVER EX Apply 1 application topically daily.   Yes Historical Provider, MD  Cranberry 450 MG TABS Take 450 mg by mouth 2 (two) times daily.   Yes Historical Provider, MD  Cyanocobalamin (VITAMIN B-12 PO) Give 1 tablet by tube daily.    Yes Historical Provider, MD  famotidine (PEPCID) 40 MG/5ML suspension Place 2.5 mLs (20 mg total) into feeding tube daily. 11/28/14  Yes Marianne L York, PA-C  ferrous sulfate 220 (44 FE) MG/5ML solution Place 325 mg into feeding tube daily. 7.95ml's   Yes Historical Provider, MD  furosemide (LASIX) 20 MG tablet Give 20 mg by tube daily.    Yes Historical Provider, MD  insulin aspart (NOVOLOG) 100 UNIT/ML injection Inject 0-9 Units into the skin 3 (three) times daily with meals. 11/28/14  Yes Marianne L York, PA-C  insulin aspart (NOVOLOG) 100 UNIT/ML injection Inject 0-9 Units into the skin 2 (two) times daily. CBG: <150 takes 0 units  151-200 takes 1 unit 201-250 takes 2 units 251-300 takes 3 units 301-350 takes 5 units 351-400 takes 7 units 400+ takes 9 units   Yes Historical Provider, MD  lisinopril (PRINIVIL,ZESTRIL) 5 MG tablet Take 1 tablet (5 mg total) by mouth daily. 12/01/14  Yes Albertine Grates, MD  metoCLOPramide (REGLAN) 5 MG/5ML solution Place 5 mg into feeding tube 4 (four) times daily -  before meals and at bedtime.   Yes Historical Provider, MD  metoprolol tartrate (LOPRESSOR) 25 MG tablet Place 1 tablet (25 mg total) into feeding tube 3 (three) times daily. 12/01/14  Yes Albertine Grates, MD  morphine (ROXANOL) 20 MG/ML concentrated solution Place 5 mg into  feeding tube daily. Prior to wound care   Yes Historical Provider, MD  Multiple Vitamins-Minerals (DECUBI-VITE) CAPS Give 1 capsule by tube daily.    Yes Historical Provider, MD  Nutritional Supplements (FEEDING SUPPLEMENT, GLUCERNA 1.2 CAL,) LIQD Place 237 mLs into feeding tube 3 (three) times daily.    Yes Historical Provider, MD  pregabalin (LYRICA) 25 MG capsule Give 25 mg by tube daily.    Yes Historical Provider, MD  Travoprost, BAK Free, (TRAVATAN) 0.004 % SOLN ophthalmic solution Place 1 drop into the right eye at bedtime.   Yes Historical Provider, MD  Water For Irrigation, Sterile (FREE WATER) SOLN Place 300 mLs into feeding tube every 4 (four) hours. 09/30/14  Yes Layne Benton, NP  Wound Dressings (MEDIHONEY WOUND/BURN DRESSING) GEL Apply 1 application topically daily.   Yes Historical Provider, MD  ciprofloxacin (CIPRO) 500 MG tablet Take 500 mg by mouth 2 (two) times daily. Starting 03/04/15, for 10 days ending 03/13/15    Historical Provider, MD  doxycycline (VIBRAMYCIN) 100 MG capsule Give 100 mg by tube 2 (two) times daily.     Historical Provider, MD  feeding supplement, ENSURE COMPLETE, (ENSURE COMPLETE) LIQD Take 237 mLs by mouth 3 (three) times daily with meals. Patient not taking: Reported on 11/24/2014 09/30/14   Marvel Plan, MD  insulin aspart (NOVOLOG) 100 UNIT/ML injection Inject 0-5 Units into the skin at bedtime. 11/28/14   Tora Kindred York, PA-C  metoCLOPramide (REGLAN) 5 MG tablet Take 1 tablet (5 mg total) by mouth 3 (three) times daily before meals. 12/01/14   Albertine Grates, MD  Nutritional Supplements (FEEDING SUPPLEMENT, VITAL AF 1.2 CAL,) LIQD Place 270 mLs into feeding tube 4 (four) times daily - after meals and at bedtime. Patient not taking: Reported on 11/24/2014 09/26/14   Layne Benton, NP  Nutritional Supplements (FEEDING SUPPLEMENT, VITAL AF 1.2 CAL,) LIQD Place 280 mLs into feeding tube 4 (four) times daily. Patient not taking: Reported on 11/24/2014 10/21/14   Catarina Hartshorn, MD    BP 127/72 mmHg  Pulse 80  Temp(Src) 98.9 F (37.2 C) (Oral)  Resp 23  SpO2 97% Physical Exam  Constitutional: She appears well-developed and well-nourished. No distress.  HENT:  Head: Normocephalic and atraumatic.  Mouth/Throat: Oropharynx is clear and moist.  Eyes: Pupils are equal, round, and reactive to light.  Neck: Normal range of motion. Neck supple.  Cardiovascular: Normal rate, regular rhythm and normal heart sounds.  Exam reveals no gallop and no friction rub.   No murmur heard. Pulmonary/Chest: Effort normal and breath sounds normal. No respiratory distress.  Musculoskeletal: She exhibits edema.  Neurological: She is alert. She exhibits normal muscle tone. Coordination normal.  Skin: Skin is warm and dry. No rash noted. No erythema.  Nursing note and vitals reviewed.   ED Course  Procedures (including critical care time) Labs Review Labs Reviewed - No data to display  I spoke with Dr. Eula ListenHussain, who advised that he felt like the patient needs to be admitted for further evaluation for possible anticoagulation versus IVC filter also needed vascular surgery consultation.  MDM   Final diagnoses:  None      I spoke with the [facility] will admit the patient for further evaluation, care, also spoke with neurology, Dr. Roseanne RenoStewart who felt that anticoagulation therapy for DVT at wait any possible intracranial bleeding, risk  Charlestine NightChristopher Quintrell Baze, PA-C 03/06/15 0107  Cathren LaineKevin Steinl, MD 03/12/15 [phone]

## 2015-03-05 NOTE — Consult Note (Addendum)
WOC wound consult note Reason for Consult: Consult requested for bilat heel wounds and sacrum wound.  Family member at bedside is well-informed regarding topical treatment.  Pt is followed by the outpatient wound care center. Wound type: sacrum wound chronic stage 4: 7 x 3 x 2 cm with undermining to 5 cm from 7 o'clock to 11 o'clock. 80% red 20% yellow slough. No odor and large amount of yellow drainage. Bone palpable with swab  Left heel stage 3: 1 x 1.5 x .2 cm 80% yellow slough 20% red small amount of yellow drainage. No odor  Left outer foot stage 1: 1 x 1 cm Right heel stage 3: 1 x 1.5 x .2 cm 90% yellow slough 10% red small amount of yellow drainage. No odor. 1 cm of brown erythema surrounding wound Pressure Ulcer POA: Yes Dressing procedure/placement/frequency:Continue present plan of care per the wound center with Aquacel to sacrum wound to absorb drainage and provide antimicrobial benefits. Air mattress to reduce pressure. Medihoney to bilateral heels to promote healing and heel lift boots to reduce pressure. The Medihoney is not available [facility]; a sample was left by pt bedside. Discussed plan of care with pt family and they denied further questions. Please re-consult if further assistance is needed.  Thank-you,  Cammie Mcgeeawn Gavynn Duvall MSN, RN, CWOCN, AldoraWCN-AP, CNS [phone]

## 2015-03-05 NOTE — Progress Notes (Signed)
Utilization review completed.  

## 2015-03-05 NOTE — Progress Notes (Signed)
[facility] PROGRESS NOTE  Susan Calhoun ZOX:[id number] DOB: [birth year] DOA: 03/04/2015 PCP: Georgann Housekeeper, MD iNTERIM SUMMARY: 81 YEAR OLD lady with h/o ICH in October with sub therapeutic INR and normal BP, presents to ED with LLE and RLE DVT.  She was started on IV heparin .  Assessment/Plan: 1. DVT bilateral: Admitted to telemetry, but because of her h/o ICH with normotensive bp's and with sub therapeutic INR'S, neurology recommended to be off anticoagulation. Discussed with Dr Roda Shutters who saw the patient in October, recommended stopping the IV heparin . Meanwhile we requested IR consult for IVC filter placement. Discussed the above with the patient and her daughter at bedside.    2. Bilateral heel ulcers: Wound care consulted and recommendations given.    Stage 4 sacral wound: Wound care consulted and recommendations given. It does not appear to be infected.    Urinary tract infection: Started on rocephin and urine cultures ordered.   Atrial fibrillation: Not well controlled. Rate control with metoprolol.    Hypertension: better controlled.    Code Status: DNR Family Communication:discussed with daughter at bedside Disposition Plan: pending.    Consultants:  Neurology Dr Roda Shutters  IR  Procedures:  Venous duplex  Antibiotics:  Rocephin on 4/14  HPI/Subjective: Mild pain in the legs.   Objective: Filed Vitals:   03/05/15 1815  BP: 107/65  Pulse: 128  Temp: 98 F (36.7 C)  Resp: 18    Intake/Output Summary (Last 24 hours) at 03/05/15 1825 Last data filed at 03/05/15 1630  Gross per 24 hour  Intake    410 ml  Output    300 ml  Net    110 ml   Filed Weights   03/05/15 0130  Weight: 61.3 kg (135 lb 2.3 oz)    Exam:   General:  Alert afebrile comfortable  Cardiovascular: s1s22  Respiratory: clear to auscultation, no wheezing or rhonchi  Abdomen: soft non tender non distended bowel sounds heard.   Musculoskeletal: left heel ulcer, right  heel ulcer stage 3. She also has a chronic stage 4 sacral ulcer.   Data Reviewed: Basic Metabolic Panel:  Recent Labs Lab 03/05/15 0111 03/05/15 0438  NA 142 141  K 4.4 4.3  CL 104 103  CO2 28 26  GLUCOSE 95 97  BUN 67* 64*  CREATININE 1.37* 1.30*  CALCIUM 8.7 8.6   Liver Function Tests: No results for input(s): AST, ALT, ALKPHOS, BILITOT, PROT, ALBUMIN in the last 168 hours. No results for input(s): LIPASE, AMYLASE in the last 168 hours. No results for input(s): AMMONIA in the last 168 hours. CBC:  Recent Labs Lab 03/05/15 0111 03/05/15 0438  WBC 10.3 10.6*  NEUTROABS 6.8  --   HGB 8.7* 8.5*  HCT 28.9* 28.5*  MCV 93.8 94.1  PLT 322 322   Cardiac Enzymes: No results for input(s): CKTOTAL, CKMB, CKMBINDEX, TROPONINI in the last 168 hours. BNP (last 3 results) No results for input(s): BNP in the last 8760 hours.  ProBNP (last 3 results)  Recent Labs  09/16/14 1730  PROBNP 9594.0*    CBG:  Recent Labs Lab 03/05/15 0328 03/05/15 0630 03/05/15 0826 03/05/15 1143 03/05/15 1610  GLUCAP 83 101* 125* 98 144*    No results found for this or any previous visit (from the past 240 hour(s)).   Studies: No results found.  Scheduled Meds: . allopurinol  100 mg Oral BID  . amantadine  100 mg Per Tube BID  . amiodarone  100 mg Per Tube Daily  .  amLODipine  10 mg Per Tube Daily  . aspirin  325 mg Per Tube Daily  . cefTRIAXone (ROCEPHIN)  IV  1 g Intravenous Q24H  . cholecalciferol  2,000 Units Per Tube Daily  . famotidine  20 mg Per Tube Daily  . feeding supplement (GLUCERNA 1.2 CAL)  237 mL Per Tube TID  . feeding supplement (PRO-STAT SUGAR FREE 64)  30 mL Per Tube TID  . ferrous sulfate  300 mg Per Tube Daily  . free water  300 mL Per Tube Q4H  . furosemide  20 mg Per Tube Daily  . insulin aspart  0-9 Units Subcutaneous 6 times per day  . latanoprost  1 drop Right Eye QHS  . lisinopril  5 mg Oral Daily  . metoCLOPramide  5 mg Per Tube TID AC & HS  .  metoprolol tartrate  25 mg Per Tube TID  . morphine CONCENTRATE  5 mg Per Tube Daily  . multivitamin  5 mL Per Tube Daily  . pregabalin  25 mg Oral Daily  . sodium chloride  3 mL Intravenous Q12H  . sodium chloride  3 mL Intravenous Q12H  . vitamin B-12  1,000 mcg Per Tube Daily   Continuous Infusions:   Principal Problem:   DVT (deep venous thrombosis) Active Problems:   Diabetes mellitus with diabetic polyneuropathy   A-fib   Hypertension   Intracerebral hemorrhage   Malnutrition   Aphasia   Dysphagia, pharyngoesophageal phase   Urinary tract infection   Occlusion of right femoral artery   Stage IV pressure ulcer of sacral region   Stage III pressure ulcer of heel    Time spent: 35 minutes    Leland Staszewski  [facility] Pager [phone]. If 7PM-7AM, please contact night-coverage at [email or web address], password [email or web address] 03/05/2015, 6:25 PM  LOS: 0 days

## 2015-03-05 NOTE — H&P (Signed)
[facility] Admission History and Physical       Susan Calhoun ZOX:[id number] DOB: [birth year] DOA: 03/04/2015  Referring physician:  EDP PCP: Georgann Housekeeper, MD  Specialists:   Chief Complaint: DVT on [place] Study  HPI: Susan Calhoun is a 81 y.o. female resident of the [facility] with a history of an Intracranial Hemorrhage due to Coumadin Rx for Atrial fibrillation which occurred in 08/2014 who was sent to the ED due to results of facility outpatient Venous Doppler Studies that revealed of the LLE DVT and Occlusion of the Right Femoral Artery.   She had ABI studies and the Venous Doppler Studies which were ordered by Dr Leanord Hawking of the [facility]  And preformed on 02/26/2015.    She also had a Stage IV Sacral decubitus ulcer and Bilateral Stage III Decubiti of the heels and is receiving care from the Wound clinic and the facility.         Review of Systems: Unable to Obtain from the Patient   Past Medical History  Diagnosis Date  . Diabetes mellitus with diabetic polyneuropathy   . A-fib   . Renal disorder   . Hypertension   . Anemia   . Glaucoma     right eye   . Carotid artery stenosis   . PVD (peripheral vascular disease)      Past Surgical History  Procedure Laterality Date  . Replacement total knee Right   . Carotid endarterectomy  2015  . Coronary artery bypass graft    . Ablation      cardiac for arrthymia       Prior to Admission medications   Medication Sig Start Date End Date Taking? Authorizing Provider  allopurinol (ZYLOPRIM) 100 MG tablet Take 100 mg by mouth 2 (two) times daily.    Yes Historical Provider, MD  amantadine (SYMMETREL) 50 MG/5ML solution Place 10 mLs (100 mg total) into feeding tube 2 (two) times daily. 10/21/14  Yes Catarina Hartshorn, MD  Amino Acids-Protein Hydrolys (FEEDING SUPPLEMENT, PRO-STAT SUGAR FREE 64,) LIQD Take 30 mLs by mouth 3 (three) times daily with meals.   Yes Historical Provider, MD  amiodarone (PACERONE) 100  MG tablet Place 1 tablet (100 mg total) into feeding tube daily. 11/28/14  Yes Marianne L York, PA-C  amLODipine (NORVASC) 10 MG tablet Place 1 tablet (10 mg total) into feeding tube daily. 11/29/14  Yes Tora Kindred York, PA-C  aspirin 325 MG tablet Place 1 tablet (325 mg total) into feeding tube daily. 10/21/14  Yes Catarina Hartshorn, MD  Cholecalciferol (VITAMIN D3) 2000 UNITS TABS Give 2,000 Units by tube daily.    Yes Historical Provider, MD  COLLAGEN MATRIX-SILVER EX Apply 1 application topically daily.   Yes Historical Provider, MD  Cranberry 450 MG TABS Take 450 mg by mouth 2 (two) times daily.   Yes Historical Provider, MD  Cyanocobalamin (VITAMIN B-12 PO) Give 1 tablet by tube daily. 1000mg    Yes Historical Provider, MD  famotidine (PEPCID) 40 MG/5ML suspension Place 2.5 mLs (20 mg total) into feeding tube daily. 11/28/14  Yes Marianne L York, PA-C  ferrous sulfate 220 (44 FE) MG/5ML solution Place 325 mg into feeding tube daily. 7.52ml's   Yes Historical Provider, MD  furosemide (LASIX) 20 MG tablet Give 20 mg by tube daily.    Yes Historical Provider, MD  insulin aspart (NOVOLOG) 100 UNIT/ML injection Inject 0-9 Units into the skin 3 (three) times daily with meals. 11/28/14  Yes Marianne L York, PA-C  insulin  aspart (NOVOLOG) 100 UNIT/ML injection Inject 0-9 Units into the skin 2 (two) times daily. CBG: <150 takes 0 units 151-200 takes 1 unit 201-250 takes 2 units 251-300 takes 3 units 301-350 takes 5 units 351-400 takes 7 units 400+ takes 9 units   Yes Historical Provider, MD  lisinopril (PRINIVIL,ZESTRIL) 5 MG tablet Take 1 tablet (5 mg total) by mouth daily. 12/01/14  Yes Albertine Grates, MD  metoCLOPramide (REGLAN) 5 MG/5ML solution Place 5 mg into feeding tube 4 (four) times daily -  before meals and at bedtime.   Yes Historical Provider, MD  metoprolol tartrate (LOPRESSOR) 25 MG tablet Place 1 tablet (25 mg total) into feeding tube 3 (three) times daily. 12/01/14  Yes Albertine Grates, MD  morphine (ROXANOL) 20  MG/ML concentrated solution Place 5 mg into feeding tube daily. Prior to wound care   Yes Historical Provider, MD  Multiple Vitamins-Minerals (DECUBI-VITE) CAPS Give 1 capsule by tube daily.    Yes Historical Provider, MD  Nutritional Supplements (FEEDING SUPPLEMENT, GLUCERNA 1.2 CAL,) LIQD Place 237 mLs into feeding tube 3 (three) times daily.    Yes Historical Provider, MD  pregabalin (LYRICA) 25 MG capsule Give 25 mg by tube daily.    Yes Historical Provider, MD  Travoprost, BAK Free, (TRAVATAN) 0.004 % SOLN ophthalmic solution Place 1 drop into the right eye at bedtime.   Yes Historical Provider, MD  Water For Irrigation, Sterile (FREE WATER) SOLN Place 300 mLs into feeding tube every 4 (four) hours. 09/30/14  Yes Layne Benton, NP  Wound Dressings (MEDIHONEY WOUND/BURN DRESSING) GEL Apply 1 application topically daily.   Yes Historical Provider, MD  ciprofloxacin (CIPRO) 500 MG tablet Take 500 mg by mouth 2 (two) times daily. Starting 03/04/15, for 10 days ending 03/13/15    Historical Provider, MD  doxycycline (VIBRAMYCIN) 100 MG capsule Give 100 mg by tube 2 (two) times daily.     Historical Provider, MD  feeding supplement, ENSURE COMPLETE, (ENSURE COMPLETE) LIQD Take 237 mLs by mouth 3 (three) times daily with meals. Patient not taking: Reported on 11/24/2014 09/30/14   Marvel Plan, MD  insulin aspart (NOVOLOG) 100 UNIT/ML injection Inject 0-5 Units into the skin at bedtime. 11/28/14   Tora Kindred York, PA-C  metoCLOPramide (REGLAN) 5 MG tablet Take 1 tablet (5 mg total) by mouth 3 (three) times daily before meals. 12/01/14   Albertine Grates, MD  Nutritional Supplements (FEEDING SUPPLEMENT, VITAL AF 1.2 CAL,) LIQD Place 270 mLs into feeding tube 4 (four) times daily - after meals and at bedtime. Patient not taking: Reported on 11/24/2014 09/26/14   Layne Benton, NP  Nutritional Supplements (FEEDING SUPPLEMENT, VITAL AF 1.2 CAL,) LIQD Place 280 mLs into feeding tube 4 (four) times daily. Patient not taking:  Reported on 11/24/2014 10/21/14   Catarina Hartshorn, MD     Allergies  Allergen Reactions  . Septra [Sulfamethoxazole-Trimethoprim] Hives    Social History:  reports that she has never smoked. She has never used smokeless tobacco. She reports that she does not drink alcohol or use illicit drugs.      History reviewed. No pertinent family history.     Physical Exam:  GEN:  Elderly Cachectic Debilitated and Bed-bound  81 y.o. African American female examined and in no acute distress Filed Vitals:   03/04/15 2334 03/05/15 0017 03/05/15 0030 03/05/15 0130  BP: 127/72 129/30 108/55 118/53  Pulse:  74 55 76  Temp:      TempSrc:  Resp: Height:     (1.499 m)  Weight:    61.3 kg (135 lb 2.3 oz)  SpO2: 97% 99% 97% 98%   Blood pressure 118/53, pulse 76, temperature 98.9 F (37.2 C), temperature source Oral, resp. rate 23, height  (1.499 m), weight 61.3 kg (135 lb 2.3 oz), SpO2 98 %. PSYCH: She is alert and oriented x 2; does not appear anxious does not appear depressed; affect is normal HEENT: Normocephalic and Atraumatic, Mucous membranes pink; PERRLA; EOM intact; Fundi:  Benign;  No scleral icterus, Nares: Patent, Oropharynx: Clear, Poor Sparse Dentition,    Neck:  FROM, No Cervical Lymphadenopathy nor Thyromegaly or Carotid Bruit; No JVD; Breasts:: Not examined CHEST WALL: No tenderness CHEST: Normal respiration, clear to auscultation bilaterally HEART: Regular rate and rhythm; no murmurs rubs or gallops BACK: No kyphosis or scoliosis; No CVA tenderness ABDOMEN: Positive Bowel Sounds, Scaphoid, Soft Non-Tender, No Rebound or Guarding; No Masses, No Organomegaly Rectal Exam: Not done  +Stage IV Decubitus Ulcer with a 5 cm diameter  EXTREMITIES: No Cyanosis, Clubbing, or Edema; + stage III Pressure Ulcers of Both Heels Genitalia: not examined PULSES: 2+ and symmetric SKIN: Normal hydration no rash or ulceration  CNS:  Alert and Oriented x 2,  Vascular: pulses  palpable throughout    Labs on Admission:  Basic Metabolic Panel:  Recent Labs Lab 03/05/15 0111  NA 142  K 4.4  CL 104  CO2 28  GLUCOSE 95  BUN 67*  CREATININE 1.37*  CALCIUM 8.7   Liver Function Tests: No results for input(s): AST, ALT, ALKPHOS, BILITOT, PROT, ALBUMIN in the last 168 hours. No results for input(s): LIPASE, AMYLASE in the last 168 hours. No results for input(s): AMMONIA in the last 168 hours. CBC:  Recent Labs Lab 03/05/15 0111  WBC 10.3  NEUTROABS 6.8  HGB 8.7*  HCT 28.9*  MCV 93.8  PLT 322   Cardiac Enzymes: No results for input(s): CKTOTAL, CKMB, CKMBINDEX, TROPONINI in the last 168 hours.  BNP (last 3 results) No results for input(s): BNP in the last 8760 hours.  ProBNP (last 3 results)  Recent Labs  09/16/14 1730  PROBNP 9594.0*    CBG: No results for input(s): GLUCAP in the last 168 hours.  Radiological Exams on Admission: No results found.   EKG: Independently reviewed.    Assessment/Plan:   81 y.o. female with  Principal Problem:   1.     DVT (deep venous thrombosis)   IV Heparin drip    Active Problems:   2.    Occlusion of right femoral artery   IV Heparin     3.    Stage IV pressure ulcer of sacral region    Wound care Evaluation    Decubitus Precautions      Air Mattress     Turns every 2 hours         4.    Stage III pressure ulcer of heel    Wound care Evaluation    Decubitus Precautions      Air Mattress     Turns every 2 hours           5.    Urinary tract infection    Urine for UA and Culture Sent      6.    Diabetes mellitus with diabetic polyneuropathy    SSI coverage PRN    Check Hb A1C      7.  A-fib- Hx of ICH due to Coumadin    Continue Pacerone and Metoprolol Rx     for rate control     8.     Hypertension   Continue Amlodipine and Metoprolol Rx    As BP and HR tolerate     9.     Intracerebral hemorrhage- Hx       10.   Malnutrition   Nutrition  Evaluation    For Protein and Caloric Needs    11.   Aphasia- Late Effect of #9     12.   Dysphagia, pharyngoesophageal phase   On Dysphagia III diet    13.   DVT Prophylaxis    On IV Heparin         Code Status:     DO NOT RESUSCITATE (DNR)      Family Communication:   Daughter at Bedside    Disposition Plan:    Inpatient Status        Time spent:  7570 Minutes      Ron ParkerJENKINS,[place] Natarajan C [facility] Pager [phone]   If 7AM -7PM Please Contact the Day Rounding Team MD for [facility]  If 7PM-7AM, Please Contact Night-Floor Coverage  [email or web address] Password [email or web address] 03/05/2015, 2:56 AM     ADDENDUM:   Patient was seen and examined on 03/05/2015

## 2015-03-05 NOTE — ED Notes (Signed)
MD Jenkins at bedside.  

## 2015-03-05 NOTE — Plan of Care (Signed)
RN paged this NP earlier in shift wondering if this NP would talk about "blood clots" and plan with the family. Later, this NP spoke to the daughter and the granddaughter over the phone.  Explained the US results. Explained that blood thinners would normally be used but can not be used in the face of the pt's ICH history. Told family that IR was consulted and should discuss other alternatives for treatment (IVC filter) tomorrow. Told family that IR would decide if pt was an appropriate candidate for procedure and explain the details of the procedure. Discussed a little bit about what an "ABI" test was and how it was used to evaluate the circulation in the legs because the pt had "ulcers" on the skin. Questions asked and answered. Family satisfied and thanked this NP for update. Jimmye NormanKaren Kirby-Graham, NP [facility]

## 2015-03-06 ENCOUNTER — Inpatient Hospital Stay (HOSPITAL_COMMUNITY): Payer: PPO

## 2015-03-06 ENCOUNTER — Encounter (HOSPITAL_COMMUNITY): Payer: Self-pay | Admitting: Radiology

## 2015-03-06 DIAGNOSIS — I82403 Acute embolism and thrombosis of unspecified deep veins of lower extremity, bilateral: Secondary | ICD-10-CM

## 2015-03-06 LAB — BASIC METABOLIC PANEL
Anion gap: 12 (ref 5–15)
BUN: 76 mg/dL — ABNORMAL HIGH (ref 6–23)
CALCIUM: 8.4 mg/dL (ref 8.4–10.5)
CHLORIDE: 105 mmol/L (ref 96–112)
CO2: 23 mmol/L (ref 19–32)
Creatinine, Ser: 1.27 mg/dL — ABNORMAL HIGH (ref 0.50–1.10)
GFR calc Af Amer: 43 mL/min — ABNORMAL LOW (ref >90)
GFR, EST NON AFRICAN AMERICAN: 37 mL/min — AB (ref >90)
Glucose, Bld: 131 mg/dL — ABNORMAL HIGH (ref 70–99)
Potassium: 4.2 mmol/L (ref 3.5–5.1)
Sodium: 140 mmol/L (ref 135–145)

## 2015-03-06 LAB — GLUCOSE, CAPILLARY
GLUCOSE-CAPILLARY: 92 mg/dL (ref 70–99)
Glucose-Capillary: 107 mg/dL — ABNORMAL HIGH (ref 70–99)
Glucose-Capillary: 134 mg/dL — ABNORMAL HIGH (ref 70–99)
Glucose-Capillary: 84 mg/dL (ref 70–99)
Glucose-Capillary: 94 mg/dL (ref 70–99)
Glucose-Capillary: 99 mg/dL (ref 70–99)

## 2015-03-06 LAB — CBC
HCT: 28.1 % — ABNORMAL LOW (ref 36.0–46.0)
HEMOGLOBIN: 8.2 g/dL — AB (ref 12.0–15.0)
MCH: 27.9 pg (ref 26.0–34.0)
MCHC: 29.2 g/dL — ABNORMAL LOW (ref 30.0–36.0)
MCV: 95.6 fL (ref 78.0–100.0)
Platelets: 323 10*3/uL (ref 150–400)
RBC: 2.94 MIL/uL — ABNORMAL LOW (ref 3.87–5.11)
RDW: 18 % — ABNORMAL HIGH (ref 11.5–15.5)
WBC: 10.1 10*3/uL (ref 4.0–10.5)

## 2015-03-06 LAB — HEMOGLOBIN A1C
HEMOGLOBIN A1C: 6.4 % — AB (ref 4.8–5.6)
Mean Plasma Glucose: 137 mg/dL

## 2015-03-06 LAB — PROTIME-INR
INR: 0.94 (ref 0.00–1.49)
Prothrombin Time: 12.7 seconds (ref 11.6–15.2)

## 2015-03-06 LAB — HEPARIN LEVEL (UNFRACTIONATED): Heparin Unfractionated: <0.1 IU/mL — ABNORMAL LOW (ref 0.30–0.70)

## 2015-03-06 MED ORDER — MIDAZOLAM HCL 2 MG/2ML IJ SOLN
INTRAMUSCULAR | Status: AC
  Filled 2015-03-06: qty 4

## 2015-03-06 MED ORDER — MIDAZOLAM HCL 2 MG/2ML IJ SOLN
INTRAMUSCULAR | Status: AC | PRN
  Administered 2015-03-06 (×2): 0.5 mg via INTRAVENOUS

## 2015-03-06 MED ORDER — IOHEXOL 300 MG/ML  SOLN
100.0000 mL | Freq: Once | INTRAMUSCULAR | Status: AC | PRN
  Administered 2015-03-06: 15 mL via INTRAVENOUS

## 2015-03-06 MED ORDER — FENTANYL CITRATE (PF) 100 MCG/2ML IJ SOLN
INTRAMUSCULAR | Status: AC | PRN
  Administered 2015-03-06 (×2): 12.5 ug via INTRAVENOUS

## 2015-03-06 MED ORDER — METOPROLOL TARTRATE 12.5 MG HALF TABLET
12.5000 mg | ORAL_TABLET | Freq: Three times a day (TID) | ORAL | Status: DC
  Administered 2015-03-06 – 2015-03-07 (×3): 12.5 mg
  Filled 2015-03-06 (×8): qty 1

## 2015-03-06 MED ORDER — FENTANYL CITRATE (PF) 100 MCG/2ML IJ SOLN
INTRAMUSCULAR | Status: AC
  Filled 2015-03-06: qty 4

## 2015-03-06 MED ORDER — LIDOCAINE HCL 1 % IJ SOLN
INTRAMUSCULAR | Status: AC
Start: 2015-03-06 — End: 2015-03-07
  Filled 2015-03-06: qty 20

## 2015-03-06 NOTE — Sedation Documentation (Signed)
Patient is resting comfortably. 

## 2015-03-06 NOTE — Progress Notes (Signed)
[facility] PROGRESS NOTE  Susan Calhoun ZOX:[id number] DOB: 03/27/1928 DOA: 03/04/2015 PCP: Georgann HousekeeperHUSAIN,KARRAR, MD iNTERIM SUMMARY: 81 YEAR OLD lady with h/o ICH in October with sub therapeutic INR and normal BP, presents to ED with LLE and RLE DVT.  She was started on IV heparin .  Assessment/Plan: 1. DVT bilateral: Admitted to telemetry, but because of her h/o ICH with normotensive bp's and with sub therapeutic INR'S, neurology recommended to be off anticoagulation. Discussed with Dr Roda ShuttersXu who saw the patient in October, recommended stopping the IV heparin . Meanwhile we requested IR consult for IVC filter placement. Discussed the above with the patient and her daughter at bedside.  She underwent IVC filter placement today and once again we went over the risks of being on anti coagulation vs being off anticoagulation.    2. Bilateral heel ulcers: Wound care consulted and recommendations given.    Stage 4 sacral wound: Wound care consulted and recommendations given. It does not appear to be infected.    Urinary tract infection: Started on rocephin and urine cultures ordered.   Atrial fibrillation: Overnight she was tachy and then brady with some pauses. We decreased the dose of metoprolol to 12.5 mg tid and if she has persistent bradycardia , we will discontinue the metoprolol.    Hypertension: better controlled.    Code Status: DNR Family Communication:discussed with daughter at bedside Disposition Plan: pending.    Consultants:  Neurology Dr Roda ShuttersXu  IR  Procedures:  Venous duplex  Antibiotics:  Rocephin on 4/14  HPI/Subjective: Mild pain in the legs. Pain control with diluadid. She denies any other complaints.   Objective: Filed Vitals:   03/06/15 1601  BP: 110/27  Pulse: 77  Temp:   Resp: 17    Intake/Output Summary (Last 24 hours) at 03/06/15 1650 Last data filed at 03/06/15 1230  Gross per 24 hour  Intake   1150 ml  Output    400 ml  Net    750 ml    Filed Weights   03/05/15 0130  Weight: 61.3 kg (135 lb 2.3 oz)    Exam:   General:  Alert afebrile comfortable  Cardiovascular: s1s22  Respiratory: clear to auscultation, no wheezing or rhonchi  Abdomen: soft non tender non distended bowel sounds heard.   Musculoskeletal: left heel ulcer, right heel ulcer stage 3. She also has a chronic stage 4 sacral ulcer.   Data Reviewed: Basic Metabolic Panel:  Recent Labs Lab 03/05/15 0111 03/05/15 0438  NA 142 141  K 4.4 4.3  CL 104 103  CO2 28 26  GLUCOSE 95 97  BUN 67* 64*  CREATININE 1.37* 1.30*  CALCIUM 8.7 8.6   Liver Function Tests: No results for input(s): AST, ALT, ALKPHOS, BILITOT, PROT, ALBUMIN in the last 168 hours. No results for input(s): LIPASE, AMYLASE in the last 168 hours. No results for input(s): AMMONIA in the last 168 hours. CBC:  Recent Labs Lab 03/05/15 0111 03/05/15 0438 03/06/15 0353  WBC 10.3 10.6* 10.1  NEUTROABS 6.8  --   --   HGB 8.7* 8.5* 8.2*  HCT 28.9* 28.5* 28.1*  MCV 93.8 94.1 95.6  PLT 322 322 323   Cardiac Enzymes: No results for input(s): CKTOTAL, CKMB, CKMBINDEX, TROPONINI in the last 168 hours. BNP (last 3 results) No results for input(s): BNP in the last 8760 hours.  ProBNP (last 3 results)  Recent Labs  09/16/14 1730  PROBNP 9594.0*    CBG:  Recent Labs Lab 03/06/15 0001 03/06/15 0406  03/06/15 0808 03/06/15 1103 03/06/15 1636  GLUCAP 134* 92 94 99 84    Recent Results (from the past 240 hour(s))  Culture, Urine     Status: None (Preliminary result)   Collection Time: 03/05/15  1:20 AM  Result Value Ref Range Status   Specimen Description URINE, CATHETERIZED  Final   Special Requests Normal  Final   Colony Count   Final    >=100,000 COLONIES/ML Performed at [facility]    Culture   Final    ESCHERICHIA COLI Performed at [facility]    Report Status PENDING  Incomplete     Studies: No results found.  Scheduled Meds: .  allopurinol  100 mg Oral BID  . amantadine  100 mg Per Tube BID  . amiodarone  100 mg Per Tube Daily  . amLODipine  10 mg Per Tube Daily  . aspirin  325 mg Per Tube Daily  . cefTRIAXone (ROCEPHIN)  IV  1 g Intravenous Q24H  . cholecalciferol  2,000 Units Per Tube Daily  . famotidine  20 mg Per Tube Daily  . feeding supplement (GLUCERNA 1.2 CAL)  237 mL Per Tube TID  . feeding supplement (PRO-STAT SUGAR FREE 64)  30 mL Per Tube TID  . fentaNYL      . ferrous sulfate  300 mg Per Tube Daily  . free water  300 mL Per Tube Q4H  . insulin aspart  0-9 Units Subcutaneous 6 times per day  . latanoprost  1 drop Right Eye QHS  . lidocaine      . lisinopril  5 mg Oral Daily  . metoCLOPramide  5 mg Per Tube TID AC & HS  . metoprolol tartrate  12.5 mg Per Tube TID  . midazolam      . morphine CONCENTRATE  5 mg Per Tube Daily  . multivitamin  5 mL Per Tube Daily  . pregabalin  25 mg Oral Daily  . sodium chloride  3 mL Intravenous Q12H  . sodium chloride  3 mL Intravenous Q12H  . vitamin B-12  1,000 mcg Per Tube Daily   Continuous Infusions:   Principal Problem:   DVT (deep venous thrombosis) Active Problems:   Diabetes mellitus with diabetic polyneuropathy   A-fib   Hypertension   Intracerebral hemorrhage   Malnutrition   Aphasia   Dysphagia, pharyngoesophageal phase   Urinary tract infection   Occlusion of right femoral artery   Stage IV pressure ulcer of sacral region   Stage III pressure ulcer of heel    Time spent: 35 minutes    Orest Dygert  [facility] Pager [phone]. If 7PM-7AM, please contact night-coverage at [email or web address], password [facility] 03/06/2015, 4:50 PM  LOS: 1 day

## 2015-03-06 NOTE — H&P (Signed)
Reason for Consult: Acute DVT and history of ICH Chief Complaint: Chief Complaint  Patient presents with  . DVT   Referring Physician(s): [facility]  History of Present Illness: Susan Calhoun is a 81 y.o. female with immobility factors and history of recent ICH who presents with acute LLE B/L DVT, venous duplex 03/05/15 revealed acute DVT right common femoral vein and left common femoral vein. IR received request for IVC filter in patient that is unable to be anticoagulated secondary to above. The patient denies any chest pain, shortness of breath or palpitations. She denies any active signs of bleeding or excessive bruising. She has no known complications to sedation.   Past Medical History  Diagnosis Date  . Diabetes mellitus with diabetic polyneuropathy   . A-fib   . Renal disorder   . Hypertension   . Anemia   . Glaucoma     right eye   . Carotid artery stenosis   . PVD (peripheral vascular disease)     Past Surgical History  Procedure Laterality Date  . Replacement total knee Right   . Carotid endarterectomy  2015  . Coronary artery bypass graft    . Ablation      cardiac for arrthymia     Allergies: Septra  Medications: Prior to Admission medications   Medication Sig Start Date End Date Taking? Authorizing Provider  allopurinol (ZYLOPRIM) 100 MG tablet Take 100 mg by mouth 2 (two) times daily.    Yes Historical Provider, MD  amantadine (SYMMETREL) 50 MG/5ML solution Place 10 mLs (100 mg total) into feeding tube 2 (two) times daily. 10/21/14  Yes Catarina Hartshorn, MD  Amino Acids-Protein Hydrolys (FEEDING SUPPLEMENT, PRO-STAT SUGAR FREE 64,) LIQD Take 30 mLs by mouth 3 (three) times daily with meals.   Yes Historical Provider, MD  amiodarone (PACERONE) 100 MG tablet Place 1 tablet (100 mg total) into feeding tube daily. 11/28/14  Yes Marianne L York, PA-C  amLODipine (NORVASC) 10 MG tablet Place 1 tablet (10 mg total) into feeding tube daily. 11/29/14  Yes Tora Kindred York, PA-C   aspirin 325 MG tablet Place 1 tablet (325 mg total) into feeding tube daily. 10/21/14  Yes Catarina Hartshorn, MD  Cholecalciferol (VITAMIN D3) 2000 UNITS TABS Give 2,000 Units by tube daily.    Yes Historical Provider, MD  COLLAGEN MATRIX-SILVER EX Apply 1 application topically daily.   Yes Historical Provider, MD  Cranberry 450 MG TABS Take 450 mg by mouth 2 (two) times daily.   Yes Historical Provider, MD  Cyanocobalamin (VITAMIN B-12 PO) Give 1 tablet by tube daily.    Yes Historical Provider, MD  famotidine (PEPCID) 40 MG/5ML suspension Place 2.5 mLs (20 mg total) into feeding tube daily. 11/28/14  Yes Marianne L York, PA-C  ferrous sulfate 220 (44 FE) MG/5ML solution Place 325 mg into feeding tube daily. 7.24ml's   Yes Historical Provider, MD  furosemide (LASIX) 20 MG tablet Give 20 mg by tube daily.    Yes Historical Provider, MD  insulin aspart (NOVOLOG) 100 UNIT/ML injection Inject 0-9 Units into the skin 3 (three) times daily with meals. 11/28/14  Yes Marianne L York, PA-C  insulin aspart (NOVOLOG) 100 UNIT/ML injection Inject 0-9 Units into the skin 2 (two) times daily. CBG: <150 takes 0 units 151-200 takes 1 unit 201-250 takes 2 units 251-300 takes 3 units 301-350 takes 5 units 351-400 takes 7 units 400+ takes 9 units   Yes Historical Provider, MD  lisinopril (PRINIVIL,ZESTRIL) 5 MG tablet Take  1 tablet (5 mg total) by mouth daily. 12/01/14  Yes Albertine Grates, MD  metoCLOPramide (REGLAN) 5 MG/5ML solution Place 5 mg into feeding tube 4 (four) times daily -  before meals and at bedtime.   Yes Historical Provider, MD  metoprolol tartrate (LOPRESSOR) 25 MG tablet Place 1 tablet (25 mg total) into feeding tube 3 (three) times daily. 12/01/14  Yes Albertine Grates, MD  morphine (ROXANOL) 20 MG/ML concentrated solution Place 5 mg into feeding tube daily. Prior to wound care   Yes Historical Provider, MD  Multiple Vitamins-Minerals (DECUBI-VITE) CAPS Give 1 capsule by tube daily.    Yes Historical Provider, MD    Nutritional Supplements (FEEDING SUPPLEMENT, GLUCERNA 1.2 CAL,) LIQD Place 237 mLs into feeding tube 3 (three) times daily.    Yes Historical Provider, MD  pregabalin (LYRICA) 25 MG capsule Give 25 mg by tube daily.    Yes Historical Provider, MD  Travoprost, BAK Free, (TRAVATAN) 0.004 % SOLN ophthalmic solution Place 1 drop into the right eye at bedtime.   Yes Historical Provider, MD  Water For Irrigation, Sterile (FREE WATER) SOLN Place 300 mLs into feeding tube every 4 (four) hours. 09/30/14  Yes Layne Benton, NP  Wound Dressings (MEDIHONEY WOUND/BURN DRESSING) GEL Apply 1 application topically daily.   Yes Historical Provider, MD  ciprofloxacin (CIPRO) 500 MG tablet Take 500 mg by mouth 2 (two) times daily. Starting 03/04/15, for 10 days ending 03/13/15    Historical Provider, MD  doxycycline (VIBRAMYCIN) 100 MG capsule Give 100 mg by tube 2 (two) times daily.     Historical Provider, MD  feeding supplement, ENSURE COMPLETE, (ENSURE COMPLETE) LIQD Take 237 mLs by mouth 3 (three) times daily with meals. Patient not taking: Reported on 11/24/2014 09/30/14   Marvel Plan, MD  insulin aspart (NOVOLOG) 100 UNIT/ML injection Inject 0-5 Units into the skin at bedtime. 11/28/14   Tora Kindred York, PA-C  metoCLOPramide (REGLAN) 5 MG tablet Take 1 tablet (5 mg total) by mouth 3 (three) times daily before meals. 12/01/14   Albertine Grates, MD  Nutritional Supplements (FEEDING SUPPLEMENT, VITAL AF 1.2 CAL,) LIQD Place 270 mLs into feeding tube 4 (four) times daily - after meals and at bedtime. Patient not taking: Reported on 11/24/2014 09/26/14   Layne Benton, NP  Nutritional Supplements (FEEDING SUPPLEMENT, VITAL AF 1.2 CAL,) LIQD Place 280 mLs into feeding tube 4 (four) times daily. Patient not taking: Reported on 11/24/2014 10/21/14   Catarina Hartshorn, MD     History reviewed. No pertinent family history.  History   Social History  . Marital Status: Single    Spouse Name: N/A  . Number of Children: N/A  . Years of  Education: N/A   Social History Main Topics  . Smoking status: Never Smoker   . Smokeless tobacco: Never Used  . Alcohol Use: No  . Drug Use: No  . Sexual Activity: No   Other Topics Concern  . None   Social History Narrative    Review of Systems: A 12 point ROS discussed and pertinent positives are indicated in the HPI above.  All other systems are negative.  Review of Systems  Vital Signs: BP 117/87 mmHg  Pulse 69  Temp(Src) 98.9 F (37.2 C) (Oral)  Resp 18  Ht  (1.499 m)  Wt 135 lb 2.3 oz (61.3 kg)  BMI 27.28 kg/m2  SpO2 99%  Physical Exam General: Alert, awake, NAD Heart: RRR without M/G/R Lungs: CTA b/l Abd: Soft, NT,  ND Ext: without edema, in braces b/l  Mallampati Score:  MD Evaluation Airway: WNL Heart: WNL Abdomen: WNL Chest/ Lungs: WNL ASA  Classification: 3 Mallampati/Airway Score: Two  Imaging: No results found.  Labs:  CBC:  Recent Labs  11/24/14 2136 03/05/15 0111 03/05/15 0438 03/06/15 0353  WBC 9.1 10.3 10.6* 10.1  HGB 10.9* 8.7* 8.5* 8.2*  HCT 34.0* 28.9* 28.5* 28.1*  PLT 309 322 322 323    COAGS:  Recent Labs  09/03/14 2245  10/08/14 2107 10/09/14 0325 10/20/14 0740 03/06/15 0825  INR 1.46  < > 0.99 0.99 1.06 0.94  APTT 30  --   --   --  31  --   < > = values in this interval not displayed.  BMP:  Recent Labs  11/30/14 0702 12/01/14 0820 03/05/15 0111 03/05/15 0438  NA 136 137 142 141  K 3.9 3.8 4.4 4.3  CL 106 107 104 103  CO2 23 23 28 26   GLUCOSE 148* 164* 95 97  BUN 28* 34* 67* 64*  CALCIUM 8.7 8.8 8.7 8.6  CREATININE 0.84 0.91 1.37* 1.30*  GFRNONAA 62* 56* 34* 36*  GFRAA 72* 65* 40* 42*    LIVER FUNCTION TESTS:  Recent Labs  11/27/14 1200 11/29/14 0524 11/30/14 0702 12/01/14 0820  BILITOT 0.9 0.6 0.4 0.5  AST 81* 88* 68* 61*  ALT 148* 144* 121* 104*  ALKPHOS 161* 192* 192* 182*  PROT 5.8* 5.9* 5.9* 5.8*  ALBUMIN 2.4* 2.3* 2.2* 2.2*    Assessment and Plan: Immobility History  of recent ICH LE DVT, venous duplex 03/05/15 revealed acute DVT right common femoral vein and left common femoral vein Request for IVC filter in patient that is unable to be anticoagulated secondary to above. The patient has been NPO, no blood thinners taken, labs and vitals have been reviewed. Risks and Benefits discussed with the patient and her granddaughter including, but not limited to bleeding, infection, contrast induced renal failure, filter fracture or migration which can lead to emergency surgery or even death, strut penetration with damage or irritation to adjacent structures and caval thrombosis. All questions were answered, patient and her granddaughter are agreeable to proceed. Consent signed and in chart.   Thank you for this interesting consult.  I greatly enjoyed meeting Susan Calhoun and look forward to participating in their care.  SignedBerneta Levins: Jailani Hogans D 03/06/2015, 1:18 PM   I spent a total of 20 Minutes in face to face in clinical consultation, greater than 50% of which was counseling/coordinating care for LE DVT and history of recent ICH unable to have anticoagulation.

## 2015-03-06 NOTE — Sedation Documentation (Signed)
Dr. Loreta AveWagner in to s/w pt.

## 2015-03-06 NOTE — Procedures (Signed)
Interventional Radiology Procedure Note  Procedure: Placement of a right IJ approach IVC filter. Denali retrievable.  Level of L2, below bilateral renal veins.   Complications: No immediate Recommendations:  - Ok to shower tomorrow - - Routine care   Signed,  Yvone NeuJaime S. Loreta AveWagner, DO

## 2015-03-06 NOTE — Progress Notes (Signed)
Changed dressing on sacral are and both heals

## 2015-03-06 NOTE — Progress Notes (Signed)
P{t. Was having some sinus pause this am lasting 2.19-2.30 seconds while sleeping HR in the 58-60's. Pt. Was in afib at 2am from junctional rhythm to sinus brady. Pt. Checked and appears comfortable denies any symptoms . Leighton ParodyK. KirbyNP was notified will follow up with the rounding doctors this am.

## 2015-03-06 NOTE — Clinical Social Work Psychosocial (Signed)
Clinical Social Work Department BRIEF PSYCHOSOCIAL ASSESSMENT 03/06/2015  Patient:  Susan Calhoun,Susan Calhoun     Account Number:  [id number]     Admit date:  03/04/2015  Clinical Social Worker:  Merlyn LotHOLOMAN,Mann Skaggs, CLINICAL SOCIAL WORKER  Date/Time:  03/05/2015 10:00 AM  Referred by:  Physician  Date Referred:  03/05/2015 Referred for  SNF Placement   Other Referral:   Interview type:  Family Other interview type:    PSYCHOSOCIAL DATA Living Status:  ALONE [facility] Level of care:  Skilled Nursing Facility Primary support name:  Trilby LeaverViviana Primary support relationship to patient:  CHILD, ADULT Degree of support available:   high level of support from daughter    CURRENT CONCERNS Current Concerns  Post-Acute Placement   Other Concerns:    SOCIAL WORK ASSESSMENT / PLAN CSW spoke with pt daughter at bedside concerning pt disposition at time of DC.  [facility] and the dtr states she would like for the pt to return to [facility] when she is medically stable.   Assessment/plan status:  Psychosocial Support/Ongoing Assessment of Needs Other assessment/ plan:   FL2 update   Information/referral to community resources:    PATIENT'S/FAMILY'S RESPONSE TO PLAN OF CARE: Patients daughter is agreeable to plan for the pt to return to [facility] but is concerned about her current state and hopeful that she will show a great deal of improvement before she has to return to the SNF.       Merlyn LotJenna Holoman, LCSWA Clinical Social Worker [phone]

## 2015-03-06 NOTE — Progress Notes (Signed)
pts family at the bedside, a lot  of questions about the HR , results of the EKG and result of duplex studies of the lower extremeties. Explained to patient plan of care and K. KirbyNP was paged and made aware of familys desire to talk to the MD about the result of the test. Will see the family after rounds or call them if the family goes home early. Family was updated and verbalized understanding and appreciation.

## 2015-03-07 LAB — GLUCOSE, CAPILLARY
GLUCOSE-CAPILLARY: 87 mg/dL (ref 70–99)
GLUCOSE-CAPILLARY: 116 mg/dL — AB (ref 70–99)
Glucose-Capillary: 129 mg/dL — ABNORMAL HIGH (ref 70–99)
Glucose-Capillary: 100 mg/dL — ABNORMAL HIGH (ref 70–99)
Glucose-Capillary: 90 mg/dL (ref 70–99)
Glucose-Capillary: 126 mg/dL — ABNORMAL HIGH (ref 70–99)

## 2015-03-07 LAB — URINE CULTURE
Colony Count: >=100000
Special Requests: NORMAL

## 2015-03-07 MED ORDER — SODIUM CHLORIDE 0.9 % IV BOLUS (SEPSIS)
1000.0000 mL | Freq: Once | INTRAVENOUS | Status: AC
  Administered 2015-03-07: 1000 mL via INTRAVENOUS

## 2015-03-07 MED ORDER — SODIUM CHLORIDE 0.9 % IV SOLN
INTRAVENOUS | Status: DC
  Administered 2015-03-07: 75 mL/h via INTRAVENOUS
  Administered 2015-03-08 – 2015-03-09 (×2): via INTRAVENOUS

## 2015-03-07 NOTE — Progress Notes (Signed)
[facility] PROGRESS NOTE  Susan Calhoun ZOX:[id number] DOB: 12/06/1927 DOA: 03/04/2015 PCP: Georgann HousekeeperHUSAIN,KARRAR, MD iNTERIM SUMMARY: 81 YEAR OLD lady with h/o ICH in October with sub therapeutic INR and normal BP, presents to ED with LLE and RLE DVT.  She was started on IV heparin .  Assessment/Plan: 1. DVT bilateral: Admitted to telemetry, but because of her h/o ICH with normotensive bp's and with sub therapeutic INR'S, neurology recommended to be off anticoagulation. Discussed with Dr Roda ShuttersXu who saw the patient in October, recommended stopping the IV heparin . Meanwhile we requested IR consult for IVC filter placement. Discussed the above with the patient and her daughter at bedside.  She underwent IVC filter placement today and once again we went over the risks of being on anti coagulation vs being off anticoagulation. She reports pain in her legs has improved.    2. Bilateral heel ulcers: Wound care consulted and recommendations given.    Stage 4 sacral wound: Wound care consulted and recommendations given. It does not appear to be infected.    Urinary tract infection: Started on rocephin and urine cultures ordered. They show E coli, sensitivities are pending.   Atrial fibrillation: Sinus tachycardia today, with rate in 110's. Borderline bp parameters today.   Hypotension, tachycardia, afebrile, no leukocytosis: on labs.  Question sepsis from UTI: 2 LITERS fluid boluses given and her bp improved. Her tachycardia remained the same. She didn't receive her metoprolol today.    h/o Atrial fibrillation: On rate controlling medications only, not on any anticoagulation because of her h/o ICH. Last Echocardiogram showed LVEF of 55%.      Code Status: DNR Family Communication:none att bedside Disposition Plan: pending.    Consultants:  Neurology Dr Roda ShuttersXu  IR  Procedures:  Venous duplex  Antibiotics:  Rocephin on 4/14  HPI/Subjective: Mild pain in the legs. Pain control  with diluadid. She denies any other complaints.   Objective: Filed Vitals:   03/07/15 1411  BP: 104/52  Pulse: 116  Temp:   Resp: 18    Intake/Output Summary (Last 24 hours) at 03/07/15 1603 Last data filed at 03/07/15 1300  Gross per 24 hour  Intake     60 ml  Output    400 ml  Net   -340 ml   Filed Weights   03/05/15 0130  Weight: 61.3 kg (135 lb 2.3 oz)    Exam:   General:  Alert afebrile comfortable  Cardiovascular: s1s22  Respiratory: clear to auscultation, no wheezing or rhonchi  Abdomen: soft non tender non distended bowel sounds heard.   Musculoskeletal: left heel ulcer, right heel ulcer stage 3. She also has a chronic stage 4 sacral ulcer.   Data Reviewed: Basic Metabolic Panel:  Recent Labs Lab 03/05/15 0111 03/05/15 0438 03/06/15 1855  NA 142 141 140  K 4.4 4.3 4.2  CL 104 103 105  CO2 28 26 23   GLUCOSE 95 97 131*  BUN 67* 64* 76*  CREATININE 1.37* 1.30* 1.27*  CALCIUM 8.7 8.6 8.4   Liver Function Tests: No results for input(s): AST, ALT, ALKPHOS, BILITOT, PROT, ALBUMIN in the last 168 hours. No results for input(s): LIPASE, AMYLASE in the last 168 hours. No results for input(s): AMMONIA in the last 168 hours. CBC:  Recent Labs Lab 03/05/15 0111 03/05/15 0438 03/06/15 0353  WBC 10.3 10.6* 10.1  NEUTROABS 6.8  --   --   HGB 8.7* 8.5* 8.2*  HCT 28.9* 28.5* 28.1*  MCV 93.8 94.1 95.6  PLT 322  322 323   Cardiac Enzymes: No results for input(s): CKTOTAL, CKMB, CKMBINDEX, TROPONINI in the last 168 hours. BNP (last 3 results) No results for input(s): BNP in the last 8760 hours.  ProBNP (last 3 results)  Recent Labs  09/16/14 1730  PROBNP 9594.0*    CBG:  Recent Labs Lab 03/06/15 2022 03/07/15 0206 03/07/15 0627 03/07/15 0822 03/07/15 1116  GLUCAP 107* 129* 100* 87 116*    Recent Results (from the past 240 hour(s))  Culture, Urine     Status: None   Collection Time: 03/05/15  1:20 AM  Result Value Ref Range Status    Specimen Description URINE, CATHETERIZED  Final   Special Requests Normal  Final   Colony Count   Final    >=100,000 COLONIES/ML Performed at [facility]    Culture   Final    ESCHERICHIA COLI Performed at [facility]    Report Status 03/07/2015 FINAL  Final   Organism ID, Bacteria ESCHERICHIA COLI  Final      Susceptibility   Escherichia coli - MIC*    AMPICILLIN >=32 RESISTANT Resistant     CEFAZOLIN <=4 SENSITIVE Sensitive     CEFTRIAXONE <=1 SENSITIVE Sensitive     CIPROFLOXACIN 1 SENSITIVE Sensitive     GENTAMICIN <=1 SENSITIVE Sensitive     LEVOFLOXACIN 1 SENSITIVE Sensitive     NITROFURANTOIN <=16 SENSITIVE Sensitive     TOBRAMYCIN <=1 SENSITIVE Sensitive     TRIMETH/SULFA <=20 SENSITIVE Sensitive     PIP/TAZO <=4 SENSITIVE Sensitive     * ESCHERICHIA COLI     Studies: Ir Ivc Filter Plmt / S&i /img Guid/mod Sed  03/06/2015   CLINICAL DATA:  81 year old female with a history of lower extremity DVT bilaterally. She has had history of hemorrhage and is not a candidate for anti coagulation.  EXAM: ULTRASOUND GUIDANCE FOR VASCULAR ACCESS  IVC CATHETERIZATION AND VENOGRAM  IVC FILTER INSERTION  Date:  4/15/20164/15/2016 4:04 pm  FLUOROSCOPY TIME:  One minutes, 0 seconds  MEDICATIONS AND MEDICAL HISTORY: None  ANESTHESIA/SEDATION: 1.0 mg Versed, 25 mcg fentanyl.  10 minutes sedation time.  CONTRAST:  15mL OMNIPAQUE IOHEXOL 300 MG/ML  SOLN  COMPLICATIONS: None  PROCEDURE: Informed consent was obtained from the patient following explanation of the procedure, risks, benefits and alternatives. The patient understands, agrees and consents for the procedure. All questions were addressed. A time out was performed.  Maximal barrier sterile technique utilized including caps, mask, sterile gowns, sterile gloves, large sterile drape, hand hygiene, and betadine prep.  Under sterile condition and local anesthesia, right internal jugular venous access was performed with ultrasound.  Over a guide wire, the IVC filter delivery sheath and inner dilator were advanced into the IVC above the IVC bifurcation. Contrast injection was performed for an IVC venogram.  Patient tolerated the procedure well and remained hemodynamically stable throughout.  No complications were encountered and no significant blood loss was encountered.  FINDINGS: IVC VENOGRAM: The IVC is patent. No evidence of thrombus, stenosis, or occlusion. No variant venous anatomy. The renal veins are identified at L1.  IVC FILTER INSERTION: Through the delivery sheath, the [facility] retrievable IVC filter was deployed in the infrarenal IVC at the L2 level just below the renal veins and above the IVC bifurcation. Contrast injection confirmed position. There is good apposition of the filter against the IVC.  The delivery sheath was removed and hemostasis was obtained with compression for 5 minutes. The patient tolerated the procedure well. No immediate  complications.  IMPRESSION: Status post placement of jugular approach Denali retrievable IVC filter.  This IVC filter is potentially retrievable. The patient can be assessed for filter retrieval by Interventional Radiology in approximately 8-12 weeks. Further recommendations regarding filter retrieval, continued surveillance or declaration of device permanence, will be made at that time.  Signed,  Yvone Neu. Loreta Ave, DO  [facility]  [facility]   Electronically Signed   By: Gilmer Mor D.O.   On: 03/06/2015 17:51    Scheduled Meds: . allopurinol  100 mg Oral BID  . amantadine  100 mg Per Tube BID  . amiodarone  100 mg Per Tube Daily  . amLODipine  10 mg Per Tube Daily  . aspirin  325 mg Per Tube Daily  . cefTRIAXone (ROCEPHIN)  IV  1 g Intravenous Q24H  . cholecalciferol  2,000 Units Per Tube Daily  . famotidine  20 mg Per Tube Daily  . feeding supplement (GLUCERNA 1.2 CAL)  237 mL Per Tube TID  . feeding supplement (PRO-STAT SUGAR  FREE 64)  30 mL Per Tube TID  . ferrous sulfate  300 mg Per Tube Daily  . free water  300 mL Per Tube Q4H  . insulin aspart  0-9 Units Subcutaneous 6 times per day  . latanoprost  1 drop Right Eye QHS  . lisinopril  5 mg Oral Daily  . metoCLOPramide  5 mg Per Tube TID AC & HS  . metoprolol tartrate  12.5 mg Per Tube TID  . morphine CONCENTRATE  5 mg Per Tube Daily  . multivitamin  5 mL Per Tube Daily  . pregabalin  25 mg Oral Daily  . sodium chloride  3 mL Intravenous Q12H  . sodium chloride  3 mL Intravenous Q12H  . vitamin B-12  1,000 mcg Per Tube Daily   Continuous Infusions:   Principal Problem:   DVT (deep venous thrombosis) Active Problems:   Diabetes mellitus with diabetic polyneuropathy   A-fib   Hypertension   Intracerebral hemorrhage   Malnutrition   Aphasia   Dysphagia, pharyngoesophageal phase   Urinary tract infection   Occlusion of right femoral artery   Stage IV pressure ulcer of sacral region   Stage III pressure ulcer of heel    Time spent: 35 minutes    Mirta Mally  [facility] Pager [phone]. If 7PM-7AM, please contact night-coverage at [email or web address], password [facility] 03/07/2015, 4:03 PM  LOS: 2 days

## 2015-03-07 NOTE — Progress Notes (Signed)
[facility] which were administered after two IVF bolus' of 1,000 ml each.  Per Dr. Blake DivineAkula OK to give with better blood pressure. Thomas HoffBurton, Copelan Maultsby McClintock, RN

## 2015-03-07 NOTE — Progress Notes (Signed)
Called rapid response to perform second assess and called Dr. Blake DivineAkula.  Patient heart consistently in 110's sinus tachycardia and low blood pressure.  Pt felt fine.  Md ordered 2 one liter bolus' and IVF at 4075ml/hr.  Pt feels fine and MD said to give medication from this morning which was held. Pt resting with call bell within reach.  Will continue to monitor.

## 2015-03-07 NOTE — Progress Notes (Signed)
Family (daughter) Maureen RalphsVivian updated on patient status.  Daughter satisfied that correct wound care is being performed. Heel dressings changed by RN Fayrene FearingJames and sacral dressing change and aquacel applied with foam dressing. Pt resting with call bell within reach.  Will continue to monitor. Thomas HoffBurton, Katha Kuehne McClintock, RN

## 2015-03-08 LAB — GLUCOSE, CAPILLARY
GLUCOSE-CAPILLARY: 89 mg/dL (ref 70–99)
GLUCOSE-CAPILLARY: 83 mg/dL (ref 70–99)
GLUCOSE-CAPILLARY: 105 mg/dL — AB (ref 70–99)
Glucose-Capillary: 99 mg/dL (ref 70–99)
Glucose-Capillary: 81 mg/dL (ref 70–99)

## 2015-03-08 MED ORDER — CEPHALEXIN 250 MG/5ML PO SUSR
500.0000 mg | Freq: Two times a day (BID) | ORAL | Status: DC
  Administered 2015-03-08 – 2015-03-10 (×5): 500 mg
  Filled 2015-03-08 (×7): qty 10

## 2015-03-08 MED ORDER — METOPROLOL TARTRATE 25 MG PO TABS
25.0000 mg | ORAL_TABLET | Freq: Three times a day (TID) | ORAL | Status: DC
Start: 2015-03-08 — End: 2015-03-10
  Administered 2015-03-08 – 2015-03-10 (×6): 25 mg
  Filled 2015-03-08 (×9): qty 1

## 2015-03-08 NOTE — Progress Notes (Signed)
[facility] PROGRESS NOTE  Susan Calhoun AVW:[id number] DOB: Mar 07, 1928 DOA: 03/04/2015 PCP: Georgann Housekeeper, MD iNTERIM SUMMARY: 81 YEAR OLD lady with h/o ICH in October with sub therapeutic INR and normal BP, presents to ED with LLE and RLE DVT.  She was started on IV heparin .  Assessment/Plan: 1. DVT bilateral: Admitted to telemetry, but because of her h/o ICH with normotensive bp's and with sub therapeutic INR'S, neurology recommended to be off anticoagulation. Discussed with Dr Roda Shutters who saw the patient in October, recommended stopping the IV heparin . Meanwhile we requested IR consult for IVC filter placement. Discussed the above with the patient and her daughter at bedside.  She underwent IVC filter placement today and once again we went over the risks of being on anti coagulation vs being off anticoagulation. She reports pain in her legs has improved.    2. Bilateral heel ulcers: Wound care consulted and recommendations given.    Stage 4 sacral wound: Wound care consulted and recommendations given.    Urinary tract infection: Started on rocephin and urine cultures ordered. They show E coli, sensitive to keflex. Changed  Rocephin to keflex.    Atrial fibrillation: Sinus tachycardia today, with rate in 110's. Increased metoprolol to 25 mg TID.  Borderline bp parameters today.   Hypotension, tachycardia, afebrile, no leukocytosis: on labs.  Question sepsis from UTI: Improved with fluids boluses and hydration.     h/o Atrial fibrillation: On rate controlling medications only, not on any anticoagulation because of her h/o ICH. Last Echocardiogram showed LVEF of 55%.      Code Status: DNR Family Communication:none att bedside Disposition Plan: pending. Possibly to SNF in am.    Consultants:  Neurology Dr Roda Shutters  IR  Procedures:  Venous duplex  Antibiotics:  Rocephin on 4/14  HPI/Subjective: Slightly drowsy, denies sob or chest pain.  She denies any other  complaints.   Objective: Filed Vitals:   03/08/15 0427  BP: 93/46  Pulse: 116  Temp: 97.9 F (36.6 C)  Resp: 18    Intake/Output Summary (Last 24 hours) at 03/08/15 1028 Last data filed at 03/08/15 0700  Gross per 24 hour  Intake    510 ml  Output   1025 ml  Net   -515 ml   Filed Weights   03/05/15 0130  Weight: 61.3 kg (135 lb 2.3 oz)    Exam:   General:  Alert afebrile comfortable  Cardiovascular: s1s2, tachycardia.   Respiratory: clear to auscultation, no wheezing or rhonchi  Abdomen: soft non tender non distended bowel sounds heard.   Musculoskeletal: left heel ulcer, right heel ulcer stage 3. She also has a chronic stage 4 sacral ulcer.   Data Reviewed: Basic Metabolic Panel:  Recent Labs Lab 03/05/15 0111 03/05/15 0438 03/06/15 1855  NA 142 141 140  K 4.4 4.3 4.2  CL 104 103 105  CO2 GLUCOSE 95 97 131*  BUN 67* 64* 76*  CREATININE 1.37* 1.30* 1.27*  CALCIUM 8.7 8.6 8.4   Liver Function Tests: No results for input(s): AST, ALT, ALKPHOS, BILITOT, PROT, ALBUMIN in the last 168 hours. No results for input(s): LIPASE, AMYLASE in the last 168 hours. No results for input(s): AMMONIA in the last 168 hours. CBC:  Recent Labs Lab 03/05/15 0111 03/05/15 0438 03/06/15 0353  WBC 10.3 10.6* 10.1  NEUTROABS 6.8  --   --   HGB 8.7* 8.5* 8.2*  HCT 28.9* 28.5* 28.1*  MCV 93.8 94.1 95.6  PLT 322  322 323   Cardiac Enzymes: No results for input(s): CKTOTAL, CKMB, CKMBINDEX, TROPONINI in the last 168 hours. BNP (last 3 results) No results for input(s): BNP in the last 8760 hours.  ProBNP (last 3 results)  Recent Labs  09/16/14 1730  PROBNP 9594.0*    CBG:  Recent Labs Lab 03/07/15 1116 03/07/15 1609 03/07/15 2020 03/08/15 0428 03/08/15 0829  GLUCAP 116* 90 126* 89 83    Recent Results (from the past 240 hour(s))  Culture, Urine     Status: None   Collection Time: 03/05/15  1:20 AM  Result Value Ref Range Status   Specimen  Description URINE, CATHETERIZED  Final   Special Requests Normal  Final   Colony Count   Final    >=100,000 COLONIES/ML [facility]    Culture   Final    ESCHERICHIA COLI [facility]formed at [facility]    Report Status 03/07/2015 FINAL  Final   Organism ID, Bacteria ESCHERICHIA COLI  Final      Susceptibility   Escherichia coli - MIC*    AMPICILLIN >=32 RESISTANT Resistant     CEFAZOLIN <=4 SENSITIVE Sensitive     CEFTRIAXONE <=1 SENSITIVE Sensitive     CIPROFLOXACIN 1 SENSITIVE Sensitive     GENTAMICIN <=1 SENSITIVE Sensitive     LEVOFLOXACIN 1 SENSITIVE Sensitive     NITROFURANTOIN <=16 SENSITIVE Sensitive     TOBRAMYCIN <=1 SENSITIVE Sensitive     TRIMETH/SULFA <=20 SENSITIVE Sensitive     PIP/TAZO <=4 SENSITIVE Sensitive     * ESCHERICHIA COLI     Studies: Ir Ivc Filter Plmt / S&i /img Guid/mod Sed  03/06/2015   CLINICAL DATA:  81 year old female with a history of lower extremity DVT bilaterally. She has had history of hemorrhage and is not a candidate for anti coagulation.  EXAM: ULTRASOUND GUIDANCE FOR VASCULAR ACCESS  IVC CATHETERIZATION AND VENOGRAM  IVC FILTER INSERTION  Date:  4/15/20164/15/2016 4:04 pm  FLUOROSCOPY TIME:  One minutes, 0 seconds  MEDICATIONS AND MEDICAL HISTORY: None  ANESTHESIA/SEDATION: 1.0 mg Versed, 25 mcg fentanyl.  10 minutes sedation time.  CONTRAST:  15mL OMNIPAQUE IOHEXOL 300 MG/ML  SOLN  COMPLICATIONS: None  PROCEDURE: Informed consent was obtained from the patient following explanation of the procedure, risks, benefits and alternatives. The patient understands, agrees and consents for the procedure. All questions were addressed. A time out was performed.  Maximal barrier sterile technique utilized including caps, mask, sterile gowns, sterile gloves, large sterile drape, hand hygiene, and betadine prep.  Under sterile condition and local anesthesia, right internal jugular venous access was performed with ultrasound. Over a  guide wire, the IVC filter delivery sheath and inner dilator were advanced into the IVC above the IVC bifurcation. Contrast injection was performed for an IVC venogram.  Patient tolerated the procedure well and remained hemodynamically stable throughout.  No complications were encountered and no significant blood loss was encountered.  FINDINGS: IVC VENOGRAM: The IVC is patent. No evidence of thrombus, stenosis, or occlusion. No variant venous anatomy. The renal veins are identified at L1.  IVC FILTER INSERTION: Through the delivery sheath, the [facility] retrievable IVC filter was deployed in the infrarenal IVC at the L2 level just below the renal veins and above the IVC bifurcation. Contrast injection confirmed position. There is good apposition of the filter against the IVC.  The delivery sheath was removed and hemostasis was obtained with compression for 5 minutes. The patient tolerated the procedure well. No immediate  complications.  IMPRESSION: Status post placement of jugular approach Denali retrievable IVC filter.  This IVC filter is potentially retrievable. The patient can be assessed for filter retrieval by Interventional Radiology in approximately 8-12 weeks. Further recommendations regarding filter retrieval, continued surveillance or declaration of device permanence, will be made at that time.  Signed,  Yvone Neu. Loreta Ave, DO  [facility]  [facility]   Electronically Signed   By: Gilmer Mor D.O.   On: 03/06/2015 17:51    Scheduled Meds: . allopurinol  100 mg Oral BID  . amantadine  100 mg Per Tube BID  . amiodarone  100 mg Per Tube Daily  . amLODipine  10 mg Per Tube Daily  . aspirin  325 mg Per Tube Daily  . cefTRIAXone (ROCEPHIN)  IV  1 g Intravenous Q24H  . cholecalciferol  2,000 Units Per Tube Daily  . famotidine  20 mg Per Tube Daily  . feeding supplement (GLUCERNA 1.2 CAL)  237 mL Per Tube TID  . feeding supplement (PRO-STAT SUGAR FREE 64)   30 mL Per Tube TID  . ferrous sulfate  300 mg Per Tube Daily  . free water  300 mL Per Tube Q4H  . insulin aspart  0-9 Units Subcutaneous 6 times per day  . latanoprost  1 drop Right Eye QHS  . lisinopril  5 mg Oral Daily  . metoCLOPramide  5 mg Per Tube TID AC & HS  . metoprolol tartrate  25 mg Per Tube TID  . morphine CONCENTRATE  5 mg Per Tube Daily  . multivitamin  5 mL Per Tube Daily  . pregabalin  25 mg Oral Daily  . sodium chloride  3 mL Intravenous Q12H  . sodium chloride  3 mL Intravenous Q12H  . vitamin B-12  1,000 mcg Per Tube Daily   Continuous Infusions: . sodium chloride 75 mL/hr at 03/08/15 0645    Principal Problem:   DVT (deep venous thrombosis) Active Problems:   Diabetes mellitus with diabetic polyneuropathy   A-fib   Hypertension   Intracerebral hemorrhage   Malnutrition   Aphasia   Dysphagia, pharyngoesophageal phase   Urinary tract infection   Occlusion of right femoral artery   Stage IV pressure ulcer of sacral region   Stage III pressure ulcer of heel    Time spent: 25 minutes    Samaria Anes  [facility] Pager [phone]. If 7PM-7AM, please contact night-coverage at [email or web address], password [facility] 03/08/2015, 10:28 AM  LOS: 3 days

## 2015-03-09 ENCOUNTER — Inpatient Hospital Stay (HOSPITAL_COMMUNITY): Payer: PPO

## 2015-03-09 LAB — GLUCOSE, CAPILLARY
GLUCOSE-CAPILLARY: 109 mg/dL — AB (ref 70–99)
GLUCOSE-CAPILLARY: 114 mg/dL — AB (ref 70–99)
GLUCOSE-CAPILLARY: 154 mg/dL — AB (ref 70–99)
GLUCOSE-CAPILLARY: 98 mg/dL (ref 70–99)
Glucose-Capillary: 84 mg/dL (ref 70–99)
Glucose-Capillary: 107 mg/dL — ABNORMAL HIGH (ref 70–99)

## 2015-03-09 LAB — BASIC METABOLIC PANEL
Anion gap: 10 (ref 5–15)
BUN: 52 mg/dL — ABNORMAL HIGH (ref 6–23)
CO2: 18 mmol/L — ABNORMAL LOW (ref 19–32)
Calcium: 8.1 mg/dL — ABNORMAL LOW (ref 8.4–10.5)
Chloride: 112 mmol/L (ref 96–112)
Creatinine, Ser: 0.71 mg/dL (ref 0.50–1.10)
GFR calc Af Amer: 89 mL/min — ABNORMAL LOW (ref >90)
GFR calc non Af Amer: 76 mL/min — ABNORMAL LOW (ref >90)
Glucose, Bld: 89 mg/dL (ref 70–99)
Potassium: 4.5 mmol/L (ref 3.5–5.1)
SODIUM: 140 mmol/L (ref 135–145)

## 2015-03-09 LAB — CBC
HEMATOCRIT: 30.8 % — AB (ref 36.0–46.0)
Hemoglobin: 9.3 g/dL — ABNORMAL LOW (ref 12.0–15.0)
MCH: 28.5 pg (ref 26.0–34.0)
MCHC: 30.2 g/dL (ref 30.0–36.0)
MCV: 94.5 fL (ref 78.0–100.0)
Platelets: 296 10*3/uL (ref 150–400)
RBC: 3.26 MIL/uL — ABNORMAL LOW (ref 3.87–5.11)
RDW: 18 % — ABNORMAL HIGH (ref 11.5–15.5)
WBC: 8.4 10*3/uL (ref 4.0–10.5)

## 2015-03-09 MED ORDER — FREE WATER
200.0000 mL | Freq: Three times a day (TID) | Status: DC
  Administered 2015-03-09 – 2015-03-10 (×3): 200 mL

## 2015-03-09 NOTE — Progress Notes (Signed)
Medicare Important Message given? YES  Date Medicare IM given:  03/09/2015  Medicare IM given by: Lasaro Primm  

## 2015-03-09 NOTE — Progress Notes (Signed)
[facility] PROGRESS NOTE  Susan Calhoun ZOX:[id number] DOB: 07/15/1928 DOA: 03/04/2015 PCP: Georgann HousekeeperHUSAIN,KARRAR, MD iNTERIM SUMMARY: 81 YEAR OLD lady with h/o ICH in October with sub therapeutic INR and normal BP, presents to ED with LLE and RLE DVT.  She was started on IV heparin .  Assessment/Plan: 1. DVT bilateral: Admitted to telemetry, but because of her h/o ICH with normotensive bp's and with sub therapeutic INR'S, neurology recommended to be off anticoagulation. Discussed with Dr Roda ShuttersXu who saw the patient in October, recommended stopping the IV heparin . Meanwhile we requested IR consult for IVC filter placement. Discussed the above with the patient and her daughter at bedside.  She underwent IVC filter placement  and once again we went over the risks of being on anti coagulation vs being off anticoagulation. She reports pain in her legs has improved.    2. Bilateral heel ulcers: Wound care consulted and recommendations given.    Stage 4 sacral wound: Wound care consulted and recommendations given.    Urinary tract infection: Started on rocephin and urine cultures ordered. They show E coli, sensitive to keflex. Changed  Rocephin to keflex.    Atrial fibrillation: Her rate is much controlled today, with increased metoprolol. Her BP is also better.   Hypotension, tachycardia, afebrile, no leukocytosis: on labs.  Question sepsis from UTI: Improved with fluids boluses and hydration.     h/o Atrial fibrillation: On rate controlling medications only, not on any anticoagulation because of her h/o ICH. Last Echocardiogram showed LVEF of 55%.   Pedal edema: no sob, slight fluid overload, will stop the fluids and watch her for 24 hours more.      Code Status: DNR Family Communication:none att bedside Disposition Plan: Possibly to SNF in am. Case  Manager to check to see if she will qualify for LTAC.    Consultants:  Neurology Dr Roda ShuttersXu  IR  Procedures:  Venous  duplex  Antibiotics:  Rocephin on 4/14  HPI/Subjective: Slightly drowsy, denies sob or chest pain.  She denies any other complaints.   Objective: Filed Vitals:   03/09/15 1122  BP: 121/48  Pulse: 98  Temp: 98.2 F (36.8 C)  Resp: 18    Intake/Output Summary (Last 24 hours) at 03/09/15 1627 Last data filed at 03/09/15 0800  Gross per 24 hour  Intake   1064 ml  Output   1825 ml  Net   -761 ml   Filed Weights   03/05/15 0130  Weight: 61.3 kg (135 lb 2.3 oz)    Exam:   General:  Alert afebrile comfortable  Cardiovascular: s1s2, tachycardia.   Respiratory: clear to auscultation, no wheezing or rhonchi  Abdomen: soft non tender non distended bowel sounds heard.   Musculoskeletal: left heel ulcer, right heel ulcer stage 3. She also has a chronic stage 4 sacral ulcer.   Data Reviewed: Basic Metabolic Panel:  Recent Labs Lab 03/05/15 0111 03/05/15 0438 03/06/15 1855 03/09/15 0416  NA 142 141 140 140  K 4.4 4.3 4.2 4.5  CL 104 103 105 112  CO2 28 26 23  18*  GLUCOSE 95 97 131* 89  BUN 67* 64* 76* 52*  CREATININE 1.37* 1.30* 1.27* 0.71  CALCIUM 8.7 8.6 8.4 8.1*   Liver Function Tests: No results for input(s): AST, ALT, ALKPHOS, BILITOT, PROT, ALBUMIN in the last 168 hours. No results for input(s): LIPASE, AMYLASE in the last 168 hours. No results for input(s): AMMONIA in the last 168 hours. CBC:  Recent Labs Lab 03/05/15  0111 03/05/15 0438 03/06/15 0353 03/09/15 0416  WBC 10.3 10.6* 10.1 8.4  NEUTROABS 6.8  --   --   --   HGB 8.7* 8.5* 8.2* 9.3*  HCT 28.9* 28.5* 28.1* 30.8*  MCV 93.8 94.1 95.6 94.5  PLT 322 322 323 296   Cardiac Enzymes: No results for input(s): CKTOTAL, CKMB, CKMBINDEX, TROPONINI in the last 168 hours. BNP (last 3 results) No results for input(s): BNP in the last 8760 hours.  ProBNP (last 3 results)  Recent Labs  09/16/14 1730  PROBNP 9594.0*    CBG:  Recent Labs Lab 03/08/15 2044 03/09/15 0050 03/09/15 0609  03/09/15 0820 03/09/15 1126  GLUCAP 105* 114* 84 98 109*    Recent Results (from the past 240 hour(s))  Culture, Urine     Status: None   Collection Time: 03/05/15  1:20 AM  Result Value Ref Range Status   Specimen Description URINE, CATHETERIZED  Final   Special Requests Normal  Final   Colony Count   Final    >=100,000 COLONIES/ML Performed at [facility]    Culture   Final    ESCHERICHIA COLI Performed at [facility]    Report Status 03/07/2015 FINAL  Final   Organism ID, Bacteria ESCHERICHIA COLI  Final      Susceptibility   Escherichia coli - MIC*    AMPICILLIN >=32 RESISTANT Resistant     CEFAZOLIN <=4 SENSITIVE Sensitive     CEFTRIAXONE <=1 SENSITIVE Sensitive     CIPROFLOXACIN 1 SENSITIVE Sensitive     GENTAMICIN <=1 SENSITIVE Sensitive     LEVOFLOXACIN 1 SENSITIVE Sensitive     NITROFURANTOIN <=16 SENSITIVE Sensitive     TOBRAMYCIN <=1 SENSITIVE Sensitive     TRIMETH/SULFA <=20 SENSITIVE Sensitive     PIP/TAZO <=4 SENSITIVE Sensitive     * ESCHERICHIA COLI     Studies: Dg Chest Port 1 View  03/09/2015   CLINICAL DATA:  Trouble breathing  EXAM: PORTABLE CHEST - 1 VIEW  COMPARISON:  Portable exam 1332 hours compared to 11/24/2014  FINDINGS: Borderline enlargement of cardiac silhouette post median sternotomy.  Mediastinal contours and pulmonary vascularity normal.  No definite infiltrate, pleural effusion or pneumothorax.  Bones diffusely demineralized.  IMPRESSION: No acute abnormalities.   Electronically Signed   By: Ulyses Southward M.D.   On: 03/09/2015 13:44    Scheduled Meds: . allopurinol  100 mg Oral BID  . amantadine  100 mg Per Tube BID  . amiodarone  100 mg Per Tube Daily  . aspirin  325 mg Per Tube Daily  . cephALEXin  500 mg Per Tube Q12H  . cholecalciferol  2,000 Units Per Tube Daily  . famotidine  20 mg Per Tube Daily  . feeding supplement (GLUCERNA 1.2 CAL)  237 mL Per Tube TID  . feeding supplement (PRO-STAT SUGAR FREE 64)  30 mL  Per Tube TID  . ferrous sulfate  300 mg Per Tube Daily  . free water  200 mL Per Tube 3 times per day  . insulin aspart  0-9 Units Subcutaneous 6 times per day  . latanoprost  1 drop Right Eye QHS  . lisinopril  5 mg Oral Daily  . metoCLOPramide  5 mg Per Tube TID AC & HS  . metoprolol tartrate  25 mg Per Tube TID  . morphine CONCENTRATE  5 mg Per Tube Daily  . multivitamin  5 mL Per Tube Daily  . pregabalin  25 mg Oral Daily  .  sodium chloride  3 mL Intravenous Q12H  . sodium chloride  3 mL Intravenous Q12H  . vitamin B-12  1,000 mcg Per Tube Daily   Continuous Infusions:    Principal Problem:   DVT (deep venous thrombosis) Active Problems:   Diabetes mellitus with diabetic polyneuropathy   A-fib   Hypertension   Intracerebral hemorrhage   Malnutrition   Aphasia   Dysphagia, pharyngoesophageal phase   Urinary tract infection   Occlusion of right femoral artery   Stage IV pressure ulcer of sacral region   Stage III pressure ulcer of heel    Time spent: 25 minutes    Kierre Deines  [facility] Pager [phone]. If 7PM-7AM, please contact night-coverage at [email or web address], password [facility] 03/09/2015, 4:27 PM  LOS: 4 days

## 2015-03-09 NOTE — Progress Notes (Signed)
Dressings changed coccyx, and bilateral heels per order. MD Blake DivineAkula at bedside to see coccyx undressed. Cleaned with Sterile water gauze and q-tips to remove slough, packed with aquacel, wet 4x4 applied and 4x4 foam dressing applied. Patient tolerated and was pre-medicated with oxycodone. Will continue to monitor.

## 2015-03-09 NOTE — Evaluation (Signed)
Physical Therapy Evaluation Patient Details Name: Susan Calhoun MRN: [id number] DOB: 12/21/1927 Today's Date: 03/09/2015   History of Present Illness  Pt is an 81 y.o. female resident of the [facility] with a history of an ICH due to Coumadin Rx for Atrial fibrillation which occurred in 08/2014. Pt was sent to the ED due to results of facility outpatient Venous Doppler Study that revealed a LLE DVT and Occlusion of the Right Femoral Artery.She had ABI studies and the Venous Doppler Studies which were ordered by Dr Leanord [facility]ob[facility]. She also has a Stage IV Sacral decubitus ulcer and Bilateral Stage III Decubiti of the heels and is receiving care from the Wound clinic and the facility.  Clinical Impression  Pt admitted with above diagnosis. Pt currently with functional limitations due to the deficits listed below (see PT Problem List). At the time of PT eval pt was able to perform bed mobility with max assistance. Focus was peri care and linen change. Further mobility was not able to be completed without a second person for physical assistance and pt safety. Pt will benefit from skilled PT to increase their independence and safety with mobility to allow discharge to the venue listed below.       Follow Up Recommendations SNF;LTACH (Depending on wound care needs)    Equipment Recommendations  None recommended by PT    Recommendations for Other Services       Precautions / Restrictions Precautions Precautions: Fall Precaution Comments: Multiple wounds Restrictions Weight Bearing Restrictions: No      Mobility  Bed Mobility Overal bed mobility: Needs Assistance;+2 for physical assistance Bed Mobility: Rolling Rolling: Max assist         General bed mobility comments: Max assist to roll bilaterally. Hand-over-hand assist for pt to reach for bed rails. Pt able to grip therapist's hand firmly however unable to hold grasp on bed rail.  Unable to attempt transition to EOB without second person to assist physically, and for safety.   Transfers                    Ambulation/Gait                Stairs            Wheelchair Mobility    Modified Rankin (Stroke Patients Only)       Balance Overall balance assessment:  (Unable to assess at this time. )                                           Pertinent Vitals/Pain Pain Assessment: Faces Faces Pain Scale: Hurts little more Pain Location: Pt is unable to state location of pain. Grimaces with movement/positioning in bed.  Pain Descriptors / Indicators: Grimacing Pain Intervention(s): Limited activity within patient's tolerance;Repositioned    Home Living Family/patient expects to be discharged to:: Skilled nursing facility                 Additional Comments: It appears pt has been in and out of SNF since November 2015.     Prior Function Level of Independence: Needs assistance   Gait / Transfers Assistance Needed: Per chart review it appears pt has been at transfer only level with lift at SNF.   ADL's / Homemaking Assistance Needed: Likely dependent with ADL's from staff at facility.  Hand Dominance        Extremity/Trunk Assessment   Upper Extremity Assessment: Defer to OT evaluation;Generalized weakness           Lower Extremity Assessment: Generalized weakness         Communication   Communication: Expressive difficulties  Cognition Arousal/Alertness: Lethargic Behavior During Therapy: Flat affect Overall Cognitive Status: History of cognitive impairments - at baseline                      General Comments      Exercises        Assessment/Plan    PT Assessment Patient needs continued PT services  PT Diagnosis Generalized weakness   PT Problem List Decreased strength;Decreased range of motion;Decreased activity tolerance;Decreased balance;Decreased mobility;Decreased  knowledge of use of DME;Decreased safety awareness;Decreased knowledge of precautions;Decreased skin integrity  PT Treatment Interventions DME instruction;Stair training;Functional mobility training;Therapeutic activities;Therapeutic exercise;Neuromuscular re-education;Patient/family education   PT Goals (Current goals can be found in the Care Plan section) Acute Rehab PT Goals PT Goal Formulation: Patient unable to participate in goal setting Time For Goal Achievement: 03/23/15 Potential to Achieve Goals: Fair    Frequency Min 1X/week   Barriers to discharge   Need for wound care    Co-evaluation               End of Session   Activity Tolerance: Patient limited by lethargy Patient left: in bed;with bed alarm set;with call bell/phone within reach Nurse Communication: Mobility status;Need for lift equipment         Time: 0820-0839 PT Time Calculation (min) (ACUTE ONLY): 19 min   Charges:   PT Evaluation $Initial PT Evaluation Tier I: 1 Procedure     PT G Codes:        Conni Slipper 03-10-2015, 9:03 AM   Conni Slipper, PT, DPT Acute Rehabilitation Services Pager: [phone]

## 2015-03-10 LAB — GLUCOSE, CAPILLARY
GLUCOSE-CAPILLARY: 105 mg/dL — AB (ref 70–99)
GLUCOSE-CAPILLARY: 116 mg/dL — AB (ref 70–99)
Glucose-Capillary: 125 mg/dL — ABNORMAL HIGH (ref 70–99)
Glucose-Capillary: 100 mg/dL — ABNORMAL HIGH (ref 70–99)

## 2015-03-10 LAB — BASIC METABOLIC PANEL
ANION GAP: 7 (ref 5–15)
BUN: 42 mg/dL — AB (ref 6–23)
CHLORIDE: 112 mmol/L (ref 96–112)
CO2: 20 mmol/L (ref 19–32)
Calcium: 8.5 mg/dL (ref 8.4–10.5)
Creatinine, Ser: 0.75 mg/dL (ref 0.50–1.10)
GFR calc non Af Amer: 75 mL/min — ABNORMAL LOW (ref >90)
GFR, EST AFRICAN AMERICAN: 87 mL/min — AB (ref >90)
Glucose, Bld: 120 mg/dL — ABNORMAL HIGH (ref 70–99)
POTASSIUM: 4.7 mmol/L (ref 3.5–5.1)
Sodium: 139 mmol/L (ref 135–145)

## 2015-03-10 MED ORDER — OXYCODONE HCL 5 MG PO TABS: 5.0000 mg | ORAL_TABLET | ORAL | Status: DC | PRN

## 2015-03-10 MED ORDER — CEPHALEXIN 250 MG/5ML PO SUSR: 500.0000 mg | Freq: Two times a day (BID) | ORAL | Status: AC

## 2015-03-10 NOTE — Discharge Summary (Signed)
Physician Discharge Summary  Kymiah Araiza ZOX:[id number] DOB: 02/25/1928 DOA: 03/04/2015  PCP: Georgann Housekeeper, MD  Admit date: 03/04/2015 Discharge date: 03/10/2015  Time spent: 30 minutes  Recommendations for Outpatient Follow-up:  1. Follow up with wound care with daily dressings.  2. Please follow up with PCP in one week.   Discharge Diagnoses:  Principal Problem:   DVT (deep venous thrombosis) Active Problems:   Diabetes mellitus with diabetic polyneuropathy   A-fib   Hypertension   Intracerebral hemorrhage   Malnutrition   Aphasia   Dysphagia, pharyngoesophageal phase   Urinary tract infection   Occlusion of right femoral artery   Stage IV pressure ulcer of sacral region   Stage III pressure ulcer of heel   Discharge Condition: improved.   Diet recommendation: tube feeds  Filed Weights   03/05/15 0130  Weight: 61.3 kg (135 lb 2.3 oz)    History of present illness:  81 YEAR OLD lady with h/o ICH in October with sub therapeutic INR and normal BP, presents to ED with LLE and RLE DVT.  She was started on IV heparin . Which was later discontinued after disussing with neurology. Discussed the risks of being on anticoagulation vs off it and patient's daughter did not want any blood thinners or anticoagulation to be started.   Hospital Course:  1. DVT bilateral: Admitted to telemetry, but because of her h/o ICH with normotensive bp's and with sub therapeutic INR'S, neurology recommended to be off anticoagulation. Discussed with Dr Roda Shutters who saw the patient in October, recommended stopping the IV heparin . Meanwhile we requested IR consult for IVC filter placement. Discussed the above with the patient and her daughter at bedside. She underwent IVC filter placement and once again we went over the risks of being on anti coagulation vs being off anticoagulation. She reports pain in her legs has improved.    2. Bilateral heel ulcers: Wound care consulted and recommendations  given.    Stage 4 sacral wound: Wound care consulted and recommendations given.    Urinary tract infection: Started on rocephin and urine cultures ordered. They show E coli, sensitive to keflex. Changed Rocephin to keflex. one more day of keflex to complete teh course.   Atrial fibrillation: Her rate is much controlled today, with increased metoprolol. Her BP is also better.   Hypotension, tachycardia, afebrile, no leukocytosis: on labs.  Question sepsis from UTI: Improved with fluids boluses and hydration.    h/o Atrial fibrillation: On rate controlling medications only, not on any anticoagulation because of her h/o ICH. Last Echocardiogram showed LVEF of 55%.  Procedures:  IVC filter placement.   Consultations:  IR  Curb side with neurology Dr Roda Shutters  Discharge Exam: Filed Vitals:   03/10/15 1237  BP: 120/68  Pulse: 85  Temp: 98.2 F (36.8 C)  Resp: 18    General: alert afebrile comfortable Cardiovascular: s1s2 Respiratory: ctab  Discharge Instructions    Current Discharge Medication List    START taking these medications   Details  cephALEXin (KEFLEX) 250 MG/5ML suspension Place 10 mLs (500 mg total) into feeding tube every 12 (twelve) hours. Qty: 40 mL, Refills: 0    oxyCODONE (OXY IR/ROXICODONE) 5 MG immediate release tablet Take 1 tablet (5 mg total) by mouth every 4 (four) hours as needed for moderate pain. Qty: 15 tablet, Refills: 0      CONTINUE these medications which have NOT CHANGED   Details  allopurinol (ZYLOPRIM) 100 MG tablet Take 100 mg by mouth 2 (  two) times daily.     amantadine (SYMMETREL) 50 MG/5ML solution Place 10 mLs (100 mg total) into feeding tube 2 (two) times daily. Qty: 473 mL, Refills: 0    Amino Acids-Protein Hydrolys (FEEDING SUPPLEMENT, PRO-STAT SUGAR FREE 64,) LIQD Take 30 mLs by mouth 3 (three) times daily with meals.    amiodarone (PACERONE) 100 MG tablet Place 1 tablet (100 mg total) into feeding tube daily.     aspirin 325 MG tablet Place 1 tablet (325 mg total) into feeding tube daily. Qty: 30 tablet, Refills: 0    Cholecalciferol (VITAMIN D3) 2000 UNITS TABS Give 2,000 Units by tube daily.     COLLAGEN MATRIX-SILVER EX Apply 1 application topically daily.    Cranberry 450 MG TABS Take 450 mg by mouth 2 (two) times daily.    Cyanocobalamin (VITAMIN B-12 PO) Give 1 tablet by tube daily.     famotidine (PEPCID) 40 MG/5ML suspension Place 2.5 mLs (20 mg total) into feeding tube daily. Qty: 50 mL, Refills: 0    ferrous sulfate 220 (44 FE) MG/5ML solution Place 325 mg into feeding tube daily. 7.81ml's    furosemide (LASIX) 20 MG tablet Give 20 mg by tube daily.     !! insulin aspart (NOVOLOG) 100 UNIT/ML injection Inject 0-9 Units into the skin 3 (three) times daily with meals. Qty: 10 mL, Refills: 11    lisinopril (PRINIVIL,ZESTRIL) 5 MG tablet Take 1 tablet (5 mg total) by mouth daily. Qty: 30 tablet, Refills: 0    metoCLOPramide (REGLAN) 5 MG/5ML solution Place 5 mg into feeding tube 4 (four) times daily -  before meals and at bedtime.    metoprolol tartrate (LOPRESSOR) 25 MG tablet Place 1 tablet (25 mg total) into feeding tube 3 (three) times daily. Qty: 90 tablet, Refills: 3    morphine (ROXANOL) 20 MG/ML concentrated solution Place 5 mg into feeding tube daily. Prior to wound care    Multiple Vitamins-Minerals (DECUBI-VITE) CAPS Give 1 capsule by tube daily.     !! Nutritional Supplements (FEEDING SUPPLEMENT, GLUCERNA 1.2 CAL,) LIQD Place 237 mLs into feeding tube 3 (three) times daily.     pregabalin (LYRICA) 25 MG capsule Give 25 mg by tube daily.     Travoprost, BAK Free, (TRAVATAN) 0.004 % SOLN ophthalmic solution Place 1 drop into the right eye at bedtime.    Water For Irrigation, Sterile (FREE WATER) SOLN Place 300 mLs into feeding tube every 4 (four) hours. Qty: 54000 mL, Refills: prn   Associated Diagnoses: CKD (chronic kidney disease), stage 1    Wound Dressings  (MEDIHONEY WOUND/BURN DRESSING) GEL Apply 1 application topically daily.    !! feeding supplement, ENSURE COMPLETE, (ENSURE COMPLETE) LIQD Take 237 mLs by mouth 3 (three) times daily with meals. Qty: 15 Bottle, Refills: 0    !! insulin aspart (NOVOLOG) 100 UNIT/ML injection Inject 0-5 Units into the skin at bedtime. Qty: 10 mL, Refills: 11    !! Nutritional Supplements (FEEDING SUPPLEMENT, VITAL AF 1.2 CAL,) LIQD Place 270 mLs into feeding tube 4 (four) times daily - after meals and at bedtime. Qty: 1000 mL, Refills: prn     !! - Potential duplicate medications found. Please discuss with provider.    STOP taking these medications     amLODipine (NORVASC) 10 MG tablet      ciprofloxacin (CIPRO) 500 MG tablet      doxycycline (VIBRAMYCIN) 100 MG capsule      metoCLOPramide (REGLAN) 5 MG tablet  Allergies  Allergen Reactions  . Septra [Sulfamethoxazole-Trimethoprim] Hives      The results of significant diagnostics from this hospitalization (including imaging, microbiology, ancillary and laboratory) are listed below for reference.    Significant Diagnostic Studies: Ir Ivc Filter Plmt / S&i /img Guid/mod Sed  03/06/2015   CLINICAL DATA:  81 year old female with a history of lower extremity DVT bilaterally. She has had history of hemorrhage and is not a candidate for anti coagulation.  EXAM: ULTRASOUND GUIDANCE FOR VASCULAR ACCESS  IVC CATHETERIZATION AND VENOGRAM  IVC FILTER INSERTION  Date:  4/15/20164/15/2016 4:04 pm  FLUOROSCOPY TIME:  One minutes, 0 seconds  MEDICATIONS AND MEDICAL HISTORY: None  ANESTHESIA/SEDATION: 1.0 mg Versed, 25 mcg fentanyl.  10 minutes sedation time.  CONTRAST:  15mL OMNIPAQUE IOHEXOL 300 MG/ML  SOLN  COMPLICATIONS: None  PROCEDURE: Informed consent was obtained from the patient following explanation of the procedure, risks, benefits and alternatives. The patient understands, agrees and consents for the procedure. All questions were addressed. A time  out was performed.  Maximal barrier sterile technique utilized including caps, mask, sterile gowns, sterile gloves, large sterile drape, hand hygiene, and betadine prep.  Under sterile condition and local anesthesia, right internal jugular venous access was performed with ultrasound. Over a guide wire, the IVC filter delivery sheath and inner dilator were advanced into the IVC above the IVC bifurcation. Contrast injection was performed for an IVC venogram.  Patient tolerated the procedure well and remained hemodynamically stable throughout.  No complications were encountered and no significant blood loss was encountered.  FINDINGS: IVC VENOGRAM: The IVC is patent. No evidence of thrombus, stenosis, or occlusion. No variant venous anatomy. The renal veins are identified at L1.  IVC FILTER INSERTION: Through the delivery sheath, the [facility] retrievable IVC filter was deployed in the infrarenal IVC at the L2 level just below the renal veins and above the IVC bifurcation. Contrast injection confirmed position. There is good apposition of the filter against the IVC.  The delivery sheath was removed and hemostasis was obtained with compression for 5 minutes. The patient tolerated the procedure well. No immediate complications.  IMPRESSION: Status post placement of jugular approach Denali retrievable IVC filter.  This IVC filter is potentially retrievable. The patient can be assessed for filter retrieval by Interventional Radiology in approximately 8-12 weeks. Further recommendations regarding filter retrieval, continued surveillance or declaration of device permanence, will be made at that time.  Signed,  Yvone Neu. Loreta Ave, DO  [facility]  [facility]   Electronically Signed   By: Gilmer Mor D.O.   On: 03/06/2015 17:51   Dg Chest Port 1 View  03/09/2015   CLINICAL DATA:  Trouble breathing  EXAM: PORTABLE CHEST - 1 VIEW  COMPARISON:  Portable exam 1332 hours compared to  11/24/2014  FINDINGS: Borderline enlargement of cardiac silhouette post median sternotomy.  Mediastinal contours and pulmonary vascularity normal.  No definite infiltrate, pleural effusion or pneumothorax.  Bones diffusely demineralized.  IMPRESSION: No acute abnormalities.   Electronically Signed   By: Ulyses Southward M.D.   On: 03/09/2015 13:44    Microbiology: Recent Results (from the past 240 hour(s))  Culture, Urine     Status: None   Collection Time: 03/05/15  1:20 AM  Result Value Ref Range Status   Specimen Description URINE, CATHETERIZED  Final   Special Requests Normal  Final   Colony Count   Final    >=100,000 COLONIES/ML Performed at [facility]  Culture   Final    ESCHERICHIA COLI [facility]    Report Status 03/07/2015 FINAL  Final   Organism ID, Bacteria ESCHERICHIA COLI  Final      Susceptibility   Escherichia coli - MIC*    AMPICILLIN >=32 RESISTANT Resistant     CEFAZOLIN <=4 SENSITIVE Sensitive     CEFTRIAXONE <=1 SENSITIVE Sensitive     CIPROFLOXACIN 1 SENSITIVE Sensitive     GENTAMICIN <=1 SENSITIVE Sensitive     LEVOFLOXACIN 1 SENSITIVE Sensitive     NITROFURANTOIN <=16 SENSITIVE Sensitive     TOBRAMYCIN <=1 SENSITIVE Sensitive     TRIMETH/SULFA <=20 SENSITIVE Sensitive     PIP/TAZO <=4 SENSITIVE Sensitive     * ESCHERICHIA COLI     Labs: Basic Metabolic Panel:  Recent Labs Lab 03/05/15 0111 03/05/15 0438 03/06/15 1855 03/09/15 0416 03/10/15 1245  NA 142 141 140 140 139  K 4.4 4.3 4.2 4.5 4.7  CL 104 103 105 112 112  CO2 28 26 23  18* 20  GLUCOSE 95 97 131* 89 120*  BUN 67* 64* 76* 52* 42*  CREATININE 1.37* 1.30* 1.27* 0.71 0.75  CALCIUM 8.7 8.6 8.4 8.1* 8.5   Liver Function Tests: No results for input(s): AST, ALT, ALKPHOS, BILITOT, PROT, ALBUMIN in the last 168 hours. No results for input(s): LIPASE, AMYLASE in the last 168 hours. No results for input(s): AMMONIA in the last 168 hours. CBC:  Recent  Labs Lab 03/05/15 0111 03/05/15 0438 03/06/15 0353 03/09/15 0416  WBC 10.3 10.6* 10.1 8.4  NEUTROABS 6.8  --   --   --   HGB 8.7* 8.5* 8.2* 9.3*  HCT 28.9* 28.5* 28.1* 30.8*  MCV 93.8 94.1 95.6 94.5  PLT 322 322 323 296   Cardiac Enzymes: No results for input(s): CKTOTAL, CKMB, CKMBINDEX, TROPONINI in the last 168 hours. BNP: BNP (last 3 results) No results for input(s): BNP in the last 8760 hours.  ProBNP (last 3 results)  Recent Labs  09/16/14 1730  PROBNP 9594.0*    CBG:  Recent Labs Lab 03/09/15 2015 03/09/15 2359 03/10/15 0402 03/10/15 0843 03/10/15 1120  GLUCAP 154* 116* 125* 105* 100*       Signed:  Nic Lampe  [facility] 03/10/2015, 1:53 PM

## 2015-03-10 NOTE — Care Management Note (Signed)
    Page 1 of 1   03/10/2015     3:14:01 PM CARE MANAGEMENT NOTE 03/10/2015  Patient:  Susan Calhoun,Susan Calhoun   Account Number:  [id number]  Date Initiated:  03/09/2015  Documentation initiated by:  Avie ArenasBROWN,SARAH  Subjective/Objective Assessment:   Admitted with 's bilateral lower extremities from SNF     Action/Plan:   Anticipated DC Date:  03/10/2015   Anticipated DC Plan:  SKILLED NURSING FACILITY  In-house referral  Clinical Social Worker      DC Planning Services  CM consult      Choice offered to / List presented to:             Status of service:  Completed, signed off Medicare Important Message given?  YES (If response is "NO", the following Medicare IM given date fields will be blank) Date Medicare IM given:  03/09/2015 Medicare IM given by:  [facility] Date Additional Medicare IM given:   Additional Medicare IM given by:    Discharge Disposition:  SKILLED NURSING FACILITY  Per UR Regulation:  Reviewed for med. necessity/level of care/duration of stay  If discussed at Long Length of Stay Meetings, dates discussed:   03/09/2015    Comments:  03-09-15 4pm Avie ArenasSarah Brown, RNBSN [phone] From a SNF, - plan for discharge back to SNF.  Insurance does not have Ltach benefits unless intubated and trached. CM will continue to follow.  Had filter placed today.  ST - BB increased - soft pressures.

## 2015-03-10 NOTE — Clinical Social Work Note (Signed)
Patient will discharge to [facility] Anticipated discharge date:03/10/15 Family notified:[name] (pt dtr) Transportation by [facility]- called at 2:30pm  CSW signing off.  Merlyn LotJenna Holoman, LCSWA Clinical Social Worker [phone]

## 2015-03-10 NOTE — Progress Notes (Signed)
Pt discharged to SNF [facility] this pm at 1504  RN attempted calling for report to receiving facility  X 3 with no result.  Sacral and bil heel dressing done prior to discharge. Picked up by [facility] at 1504 .Condition at discharge was stable

## 2015-03-10 NOTE — Progress Notes (Signed)
Pt right hand swollen - greater than left hand.  Pt hand warm to touch and radial pulse palpable.  Pt denies any pain or discomfort.  Pt repositioned and right arm elevated on pillow.  Will continue to monitor pt closely.  Erenest Rasheraldwell,Kollen Armenti B, RN

## 2015-03-10 NOTE — Clinical Social Work Note (Signed)
We have received insurance auth- patient has bed available when ready for DC.  Merlyn LotJenna Holoman, LCSWA Clinical Social Worker [phone]

## 2015-03-22 DIAGNOSIS — I82409 Acute embolism and thrombosis of unspecified deep veins of unspecified lower extremity: Secondary | ICD-10-CM

## 2015-03-22 HISTORY — PX: VENA CAVA FILTER PLACEMENT: SUR1032

## 2015-03-22 HISTORY — DX: Acute embolism and thrombosis of unspecified deep veins of unspecified lower extremity: I82.409

## 2015-03-23 ENCOUNTER — Encounter (HOSPITAL_BASED_OUTPATIENT_CLINIC_OR_DEPARTMENT_OTHER): Payer: PPO | Attending: Plastic Surgery

## 2015-03-23 DIAGNOSIS — I2510 Atherosclerotic heart disease of native coronary artery without angina pectoris: Secondary | ICD-10-CM | POA: Insufficient documentation

## 2015-03-23 DIAGNOSIS — I4891 Unspecified atrial fibrillation: Secondary | ICD-10-CM | POA: Insufficient documentation

## 2015-03-23 DIAGNOSIS — Z951 Presence of aortocoronary bypass graft: Secondary | ICD-10-CM | POA: Diagnosis not present

## 2015-03-23 DIAGNOSIS — K219 Gastro-esophageal reflux disease without esophagitis: Secondary | ICD-10-CM | POA: Insufficient documentation

## 2015-03-23 DIAGNOSIS — L89154 Pressure ulcer of sacral region, stage 4: Secondary | ICD-10-CM | POA: Diagnosis not present

## 2015-03-23 DIAGNOSIS — Z794 Long term (current) use of insulin: Secondary | ICD-10-CM | POA: Diagnosis not present

## 2015-03-23 DIAGNOSIS — L89622 Pressure ulcer of left heel, stage 2: Secondary | ICD-10-CM | POA: Diagnosis not present

## 2015-03-23 DIAGNOSIS — E11621 Type 2 diabetes mellitus with foot ulcer: Secondary | ICD-10-CM | POA: Insufficient documentation

## 2015-03-23 DIAGNOSIS — Z87891 Personal history of nicotine dependence: Secondary | ICD-10-CM | POA: Insufficient documentation

## 2015-03-23 DIAGNOSIS — Z931 Gastrostomy status: Secondary | ICD-10-CM | POA: Diagnosis not present

## 2015-03-23 DIAGNOSIS — I82409 Acute embolism and thrombosis of unspecified deep veins of unspecified lower extremity: Secondary | ICD-10-CM | POA: Insufficient documentation

## 2015-03-23 DIAGNOSIS — L89614 Pressure ulcer of right heel, stage 4: Secondary | ICD-10-CM | POA: Insufficient documentation

## 2015-03-23 DIAGNOSIS — I10 Essential (primary) hypertension: Secondary | ICD-10-CM | POA: Insufficient documentation

## 2015-03-23 DIAGNOSIS — Z8673 Personal history of transient ischemic attack (TIA), and cerebral infarction without residual deficits: Secondary | ICD-10-CM | POA: Diagnosis not present

## 2015-04-13 DIAGNOSIS — L89154 Pressure ulcer of sacral region, stage 4: Secondary | ICD-10-CM | POA: Diagnosis not present

## 2015-04-13 DIAGNOSIS — K219 Gastro-esophageal reflux disease without esophagitis: Secondary | ICD-10-CM | POA: Diagnosis not present

## 2015-04-13 DIAGNOSIS — L89614 Pressure ulcer of right heel, stage 4: Secondary | ICD-10-CM | POA: Diagnosis not present

## 2015-04-13 DIAGNOSIS — L89622 Pressure ulcer of left heel, stage 2: Secondary | ICD-10-CM | POA: Diagnosis not present

## 2015-04-27 ENCOUNTER — Encounter (HOSPITAL_COMMUNITY): Payer: Self-pay

## 2015-04-27 ENCOUNTER — Emergency Department (HOSPITAL_COMMUNITY): Payer: PPO

## 2015-04-27 ENCOUNTER — Inpatient Hospital Stay (HOSPITAL_COMMUNITY)
Admission: EM | Admit: 2015-04-27 | Discharge: 2015-05-05 | DRG: 871 | Disposition: A | Discharge status: Skilled Nursing Facility | Payer: PPO | Attending: Internal Medicine | Admitting: Internal Medicine

## 2015-04-27 DIAGNOSIS — R4182 Altered mental status, unspecified: Secondary | ICD-10-CM | POA: Diagnosis not present

## 2015-04-27 DIAGNOSIS — L97429 Non-pressure chronic ulcer of left heel and midfoot with unspecified severity: Secondary | ICD-10-CM | POA: Diagnosis present

## 2015-04-27 DIAGNOSIS — H409 Unspecified glaucoma: Secondary | ICD-10-CM | POA: Diagnosis present

## 2015-04-27 DIAGNOSIS — L89154 Pressure ulcer of sacral region, stage 4: Secondary | ICD-10-CM | POA: Diagnosis present

## 2015-04-27 DIAGNOSIS — J690 Pneumonitis due to inhalation of food and vomit: Secondary | ICD-10-CM | POA: Diagnosis not present

## 2015-04-27 DIAGNOSIS — D649 Anemia, unspecified: Secondary | ICD-10-CM | POA: Diagnosis not present

## 2015-04-27 DIAGNOSIS — E1142 Type 2 diabetes mellitus with diabetic polyneuropathy: Secondary | ICD-10-CM | POA: Diagnosis present

## 2015-04-27 DIAGNOSIS — A047 Enterocolitis due to Clostridium difficile: Secondary | ICD-10-CM | POA: Diagnosis present

## 2015-04-27 DIAGNOSIS — Z931 Gastrostomy status: Secondary | ICD-10-CM | POA: Diagnosis not present

## 2015-04-27 DIAGNOSIS — Z8673 Personal history of transient ischemic attack (TIA), and cerebral infarction without residual deficits: Secondary | ICD-10-CM | POA: Diagnosis not present

## 2015-04-27 DIAGNOSIS — L97419 Non-pressure chronic ulcer of right heel and midfoot with unspecified severity: Secondary | ICD-10-CM | POA: Diagnosis present

## 2015-04-27 DIAGNOSIS — I739 Peripheral vascular disease, unspecified: Secondary | ICD-10-CM | POA: Diagnosis present

## 2015-04-27 DIAGNOSIS — B9620 Unspecified Escherichia coli [E. coli] as the cause of diseases classified elsewhere: Secondary | ICD-10-CM | POA: Diagnosis present

## 2015-04-27 DIAGNOSIS — E869 Volume depletion, unspecified: Secondary | ICD-10-CM | POA: Diagnosis present

## 2015-04-27 DIAGNOSIS — R1314 Dysphagia, pharyngoesophageal phase: Secondary | ICD-10-CM | POA: Diagnosis not present

## 2015-04-27 DIAGNOSIS — R509 Fever, unspecified: Secondary | ICD-10-CM

## 2015-04-27 DIAGNOSIS — Z7982 Long term (current) use of aspirin: Secondary | ICD-10-CM | POA: Diagnosis not present

## 2015-04-27 DIAGNOSIS — N182 Chronic kidney disease, stage 2 (mild): Secondary | ICD-10-CM | POA: Diagnosis present

## 2015-04-27 DIAGNOSIS — A419 Sepsis, unspecified organism: Secondary | ICD-10-CM | POA: Insufficient documentation

## 2015-04-27 DIAGNOSIS — Z6825 Body mass index (BMI) 25.0-25.9, adult: Secondary | ICD-10-CM

## 2015-04-27 DIAGNOSIS — Z86718 Personal history of other venous thrombosis and embolism: Secondary | ICD-10-CM | POA: Diagnosis not present

## 2015-04-27 DIAGNOSIS — I959 Hypotension, unspecified: Secondary | ICD-10-CM | POA: Diagnosis present

## 2015-04-27 DIAGNOSIS — Z515 Encounter for palliative care: Secondary | ICD-10-CM | POA: Diagnosis not present

## 2015-04-27 DIAGNOSIS — I10 Essential (primary) hypertension: Secondary | ICD-10-CM | POA: Diagnosis present

## 2015-04-27 DIAGNOSIS — N390 Urinary tract infection, site not specified: Secondary | ICD-10-CM | POA: Diagnosis present

## 2015-04-27 DIAGNOSIS — I482 Chronic atrial fibrillation: Secondary | ICD-10-CM | POA: Diagnosis present

## 2015-04-27 DIAGNOSIS — I129 Hypertensive chronic kidney disease with stage 1 through stage 4 chronic kidney disease, or unspecified chronic kidney disease: Secondary | ICD-10-CM | POA: Diagnosis present

## 2015-04-27 DIAGNOSIS — Z96651 Presence of right artificial knee joint: Secondary | ICD-10-CM | POA: Diagnosis present

## 2015-04-27 DIAGNOSIS — Z794 Long term (current) use of insulin: Secondary | ICD-10-CM | POA: Diagnosis not present

## 2015-04-27 DIAGNOSIS — G92 Toxic encephalopathy: Secondary | ICD-10-CM | POA: Diagnosis present

## 2015-04-27 DIAGNOSIS — G9340 Encephalopathy, unspecified: Secondary | ICD-10-CM | POA: Diagnosis not present

## 2015-04-27 DIAGNOSIS — R1310 Dysphagia, unspecified: Secondary | ICD-10-CM | POA: Diagnosis present

## 2015-04-27 DIAGNOSIS — E440 Moderate protein-calorie malnutrition: Secondary | ICD-10-CM | POA: Insufficient documentation

## 2015-04-27 DIAGNOSIS — I4891 Unspecified atrial fibrillation: Secondary | ICD-10-CM | POA: Diagnosis not present

## 2015-04-27 DIAGNOSIS — E876 Hypokalemia: Secondary | ICD-10-CM | POA: Diagnosis present

## 2015-04-27 DIAGNOSIS — Z66 Do not resuscitate: Secondary | ICD-10-CM | POA: Diagnosis present

## 2015-04-27 DIAGNOSIS — B961 Klebsiella pneumoniae [K. pneumoniae] as the cause of diseases classified elsewhere: Secondary | ICD-10-CM | POA: Diagnosis present

## 2015-04-27 DIAGNOSIS — E119 Type 2 diabetes mellitus without complications: Secondary | ICD-10-CM

## 2015-04-27 HISTORY — DX: Inflammatory liver disease, unspecified: K75.9

## 2015-04-27 HISTORY — DX: Unspecified osteoarthritis, unspecified site: M19.90

## 2015-04-27 HISTORY — DX: Acute embolism and thrombosis of unspecified deep veins of unspecified lower extremity: I82.409

## 2015-04-27 HISTORY — DX: Anemia, unspecified: D64.9

## 2015-04-27 HISTORY — DX: Type 2 diabetes mellitus without complications: E11.9

## 2015-04-27 HISTORY — DX: Atherosclerotic heart disease of native coronary artery without angina pectoris: I25.10

## 2015-04-27 HISTORY — DX: Chronic kidney disease, stage 2 (mild): N18.2

## 2015-04-27 HISTORY — DX: Pure hypercholesterolemia, unspecified: E78.00

## 2015-04-27 HISTORY — DX: Cerebral infarction, unspecified: I63.9

## 2015-04-27 HISTORY — DX: Personal history of other medical treatment: Z92.89

## 2015-04-27 HISTORY — DX: Gout, unspecified: M10.9

## 2015-04-27 LAB — COMPREHENSIVE METABOLIC PANEL
ALT: 11 U/L — AB (ref 14–54)
AST: 15 U/L (ref 15–41)
Albumin: 2.2 g/dL — ABNORMAL LOW (ref 3.5–5.0)
Alkaline Phosphatase: 71 U/L (ref 38–126)
Anion gap: 9 (ref 5–15)
BUN: 42 mg/dL — ABNORMAL HIGH (ref 6–20)
CO2: 25 mmol/L (ref 22–32)
Calcium: 8.9 mg/dL (ref 8.9–10.3)
Chloride: 97 mmol/L — ABNORMAL LOW (ref 101–111)
Creatinine, Ser: 0.95 mg/dL (ref 0.44–1.00)
GFR calc Af Amer: >60 mL/min (ref >60)
GFR calc non Af Amer: 53 mL/min — ABNORMAL LOW (ref >60)
GLUCOSE: 103 mg/dL — AB (ref 65–99)
POTASSIUM: 3.9 mmol/L (ref 3.5–5.1)
Sodium: 131 mmol/L — ABNORMAL LOW (ref 135–145)
Total Bilirubin: 0.5 mg/dL (ref 0.3–1.2)
Total Protein: 5.7 g/dL — ABNORMAL LOW (ref 6.5–8.1)

## 2015-04-27 LAB — URINE MICROSCOPIC-ADD ON

## 2015-04-27 LAB — CBC WITH DIFFERENTIAL/PLATELET
BASOS PCT: 0 % (ref 0–1)
Basophils Absolute: 0 10*3/uL (ref 0.0–0.1)
EOS PCT: 1 % (ref 0–5)
Eosinophils Absolute: 0.1 10*3/uL (ref 0.0–0.7)
HCT: 26.6 % — ABNORMAL LOW (ref 36.0–46.0)
HEMOGLOBIN: 8.5 g/dL — AB (ref 12.0–15.0)
LYMPHS PCT: 15 % (ref 12–46)
Lymphs Abs: 2.2 10*3/uL (ref 0.7–4.0)
MCH: 28.9 pg (ref 26.0–34.0)
MCHC: 32 g/dL (ref 30.0–36.0)
MCV: 90.5 fL (ref 78.0–100.0)
Monocytes Absolute: 1.6 10*3/uL — ABNORMAL HIGH (ref 0.1–1.0)
Monocytes Relative: 11 % (ref 3–12)
NEUTROS ABS: 10.3 10*3/uL — AB (ref 1.7–7.7)
Neutrophils Relative %: 73 % (ref 43–77)
Platelets: 350 10*3/uL (ref 150–400)
RBC: 2.94 MIL/uL — AB (ref 3.87–5.11)
RDW: 15.9 % — AB (ref 11.5–15.5)
WBC: 14.2 10*3/uL — AB (ref 4.0–10.5)

## 2015-04-27 LAB — URINALYSIS, ROUTINE W REFLEX MICROSCOPIC
BILIRUBIN URINE: NEGATIVE
Glucose, UA: NEGATIVE mg/dL
Ketones, ur: NEGATIVE mg/dL
NITRITE: POSITIVE — AB
Protein, ur: 30 mg/dL — AB
SPECIFIC GRAVITY, URINE: 1.014 (ref 1.005–1.030)
Urobilinogen, UA: 0.2 mg/dL (ref 0.0–1.0)
pH: 5 (ref 5.0–8.0)

## 2015-04-27 LAB — I-STAT CG4 LACTIC ACID, ED
LACTIC ACID, VENOUS: 1.14 mmol/L (ref 0.5–2.0)
Lactic Acid, Venous: 0.91 mmol/L (ref 0.5–2.0)

## 2015-04-27 LAB — I-STAT TROPONIN, ED: Troponin i, poc: 0.04 ng/mL (ref 0.00–0.08)

## 2015-04-27 MED ORDER — SODIUM CHLORIDE 0.9 % IV SOLN
1000.0000 mL | INTRAVENOUS | Status: DC
  Administered 2015-04-27: 1000 mL via INTRAVENOUS

## 2015-04-27 MED ORDER — PIPERACILLIN-TAZOBACTAM 3.375 G IVPB 30 MIN
3.3750 g | Freq: Once | INTRAVENOUS | Status: AC
  Administered 2015-04-27: 3.375 g via INTRAVENOUS
  Filled 2015-04-27: qty 50

## 2015-04-27 MED ORDER — SODIUM CHLORIDE 0.9 % IV BOLUS (SEPSIS)
1000.0000 mL | Freq: Once | INTRAVENOUS | Status: AC
  Administered 2015-04-27: 1000 mL via INTRAVENOUS

## 2015-04-27 MED ORDER — VANCOMYCIN HCL IN DEXTROSE 750-5 MG/150ML-% IV SOLN
750.0000 mg | INTRAVENOUS | Status: DC
  Administered 2015-04-28 – 2015-04-29 (×2): 750 mg via INTRAVENOUS
  Filled 2015-04-27 (×4): qty 150

## 2015-04-27 MED ORDER — VANCOMYCIN HCL IN DEXTROSE 1-5 GM/200ML-% IV SOLN
1000.0000 mg | Freq: Once | INTRAVENOUS | Status: AC
  Administered 2015-04-27: 1000 mg via INTRAVENOUS
  Filled 2015-04-27: qty 200

## 2015-04-27 NOTE — Progress Notes (Addendum)
ANTIBIOTIC CONSULT NOTE - INITIAL  Pharmacy Consult for Vancomycin Indication: rule out sepsis  Allergies  Allergen Reactions  . Septra [Sulfamethoxazole-Trimethoprim] Hives    Patient Measurements:   Adjusted Body Weight:   Vital Signs: Temp: 99.6 F (37.6 C) (06/06 1913) Temp Source: Rectal (06/06 1913) BP: 110/62 mmHg (06/06 1928) Pulse Rate: 130 (06/06 1928) Intake/Output from previous day:   Intake/Output from this shift:    Labs: No results for input(s): WBC, HGB, PLT, LABCREA, CREATININE in the last 72 hours. CrCl cannot be calculated (Unknown ideal weight.). No results for input(s): VANCOTROUGH, VANCOPEAK, VANCORANDOM, GENTTROUGH, GENTPEAK, GENTRANDOM, TOBRATROUGH, TOBRAPEAK, TOBRARND, AMIKACINPEAK, AMIKACINTROU, AMIKACIN in the last 72 hours.   Microbiology: No results found for this or any previous visit (from the past 720 hour(s)).  Medical History: Past Medical History  Diagnosis Date  . Diabetes mellitus with diabetic polyneuropathy   . A-fib   . Renal disorder   . Hypertension   . Anemia   . Glaucoma     right eye   . Carotid artery stenosis   . PVD (peripheral vascular disease)     Medications:   (Not in a hospital admission) Scheduled:   Infusions:  . sodium chloride    . piperacillin-tazobactam    . sodium chloride 1,000 mL (04/27/15 1916)  . vancomycin     Assessment: 81yo female presents with LOC since Saturday. Pharmacy is consulted to dose vancomycin for suspected sepsis. Pt is slightly febrile to 99.6, WBC 14.2, sCr 0.95, LA 0.9.  Goal of Therapy:  Vancomycin trough level 15-20 mcg/ml  Plan:  Vancomycin 1g IV once followed by 750mg  q24h Measure antibiotic drug levels at steady state Follow up culture results, renal function, and clinical course  Arlean HoppingCorey M. Newman PiesBall, PharmD Clinical Pharmacist Pager [phone] 04/27/2015,7:33 PM

## 2015-04-27 NOTE — ED Notes (Signed)
Waiting for blood culture draws to start antibiotics.

## 2015-04-27 NOTE — ED Provider Notes (Signed)
CSN: [id number]     Arrival date & time 04/27/15  1834 History   First MD Initiated Contact with Patient 04/27/15 1849     Chief Complaint  Patient presents with  . Altered Mental Status  . Urinary Tract Infection    Level V caveat due to altered mental status. (Consider location/radiation/quality/duration/timing/severity/associated sxs/prior Treatment) Patient is a 81 y.o. female presenting with altered mental status and urinary tract infection. The history is provided by the patient and a relative.  Altered Mental Status Urinary Tract Infection  patient presents with altered mental status. She is in a nursing home but apparently this fairly conversive discharge. She's been much less of onset the last couple days. Lab work was reportedly drawn at nursing home showed urinary tract infection. Has been started on Rocephin. Unknown fevers. Does have known decubitus ulcers. Has a history of A. fib but is not on anticoagulation due to previous intracranial hemorrhage.  Past Medical History  Diagnosis Date  . Diabetes mellitus with diabetic polyneuropathy   . A-fib   . Renal disorder   . Hypertension   . Anemia   . Glaucoma     right eye   . Carotid artery stenosis   . PVD (peripheral vascular disease)    Past Surgical History  Procedure Laterality Date  . Replacement total knee Right   . Carotid endarterectomy  2015  . Coronary artery bypass graft    . Ablation      cardiac for arrthymia    History reviewed. No pertinent family history. History  Substance Use Topics  . Smoking status: Never Smoker   . Smokeless tobacco: Never Used  . Alcohol Use: No   OB History    No data available     Review of Systems  Unable to perform ROS     Allergies  Septra  Home Medications   Prior to Admission medications   Medication Sig Start Date End Date Taking? Authorizing Provider  allopurinol (ZYLOPRIM) 100 MG tablet Take 100 mg by mouth 2 (two) times daily.    Yes Historical  Provider, MD  amantadine (SYMMETREL) 50 MG/5ML solution Place 10 mLs (100 mg total) into feeding tube 2 (two) times daily. 10/21/14  Yes Catarina Hartshorn, MD  Amino Acids-Protein Hydrolys (FEEDING SUPPLEMENT, PRO-STAT SUGAR FREE 64,) LIQD Take 30 mLs by mouth 3 (three) times daily with meals.   Yes Historical Provider, MD  amiodarone (PACERONE) 100 MG tablet Place 1 tablet (100 mg total) into feeding tube daily. 11/28/14  Yes Tora Kindred York, PA-C  aspirin 325 MG tablet Place 1 tablet (325 mg total) into feeding tube daily. 10/21/14  Yes Catarina Hartshorn, MD  cefTRIAXone (ROCEPHIN) 1 G injection Inject 1 g into the muscle daily. For 3 days only. Started on 04-26-15   Yes Historical Provider, MD  Cholecalciferol (VITAMIN D3) 2000 UNITS TABS Give 2,000 Units by tube daily.    Yes Historical Provider, MD  COLLAGEN MATRIX-SILVER EX Apply 1 application topically daily.   Yes Historical Provider, MD  Cranberry 450 MG TABS Take 450 mg by mouth 2 (two) times daily.   Yes Historical Provider, MD  Cyanocobalamin (VITAMIN B-12 PO) Give 1 tablet by tube daily. 1000mg    Yes Historical Provider, MD  famotidine (PEPCID) 40 MG/5ML suspension Place 2.5 mLs (20 mg total) into feeding tube daily. 11/28/14  Yes Marianne L York, PA-C  ferrous sulfate 220 (44 FE) MG/5ML solution Place 325 mg into feeding tube daily. 7.55ml's   Yes Historical Provider,  MD  furosemide (LASIX) 20 MG tablet Give 20 mg by tube daily.    Yes Historical Provider, MD  insulin aspart (NOVOLOG) 100 UNIT/ML injection Inject 0-5 Units into the skin at bedtime. 11/28/14  Yes Marianne L York, PA-C  insulin aspart (NOVOLOG) 100 UNIT/ML injection Inject 0-9 Units into the skin 3 (three) times daily with meals. 11/28/14  Yes Marianne L York, PA-C  lisinopril (PRINIVIL,ZESTRIL) 5 MG tablet Take 1 tablet (5 mg total) by mouth daily. 12/01/14  Yes Albertine Grates, MD  metoCLOPramide (REGLAN) 5 MG/5ML solution Place 5 mg into feeding tube 4 (four) times daily -  before meals and at bedtime.    Yes Historical Provider, MD  metoprolol tartrate (LOPRESSOR) 25 MG tablet Place 1 tablet (25 mg total) into feeding tube 3 (three) times daily. 12/01/14  Yes Albertine Grates, MD  morphine (ROXANOL) 20 MG/ML concentrated solution Place 5 mg into feeding tube daily. Prior to wound care   Yes Historical Provider, MD  Multiple Vitamins-Minerals (DECUBI-VITE) CAPS Give 1 capsule by tube daily.    Yes Historical Provider, MD  Nutritional Supplements (FEEDING SUPPLEMENT, GLUCERNA 1.2 CAL,) LIQD Place 237 mLs into feeding tube 4 (four) times daily.    Yes Historical Provider, MD  oxyCODONE (OXY IR/ROXICODONE) 5 MG immediate release tablet Take 1 tablet (5 mg total) by mouth every 4 (four) hours as needed for moderate pain. 03/10/15  Yes Kathlen Mody, MD  pregabalin (LYRICA) 25 MG capsule Give 25 mg by tube daily.    Yes Historical Provider, MD  Travoprost, BAK Free, (TRAVATAN) 0.004 % SOLN ophthalmic solution Place 1 drop into the right eye at bedtime.   Yes Historical Provider, MD  vitamin C (ASCORBIC ACID) 500 MG tablet Give 500 mg by tube daily.   Yes Historical Provider, MD  Wound Dressings (MEDIHONEY WOUND/BURN DRESSING) GEL Apply 1 application topically daily.   Yes Historical Provider, MD  Zinc 30 MG TABS Give 60 mg by tube daily.   Yes Historical Provider, MD  feeding supplement, ENSURE COMPLETE, (ENSURE COMPLETE) LIQD Take 237 mLs by mouth 3 (three) times daily with meals. Patient not taking: Reported on 11/24/2014 09/30/14   Marvel Plan, MD  Nutritional Supplements (FEEDING SUPPLEMENT, VITAL AF 1.2 CAL,) LIQD Place 270 mLs into feeding tube 4 (four) times daily - after meals and at bedtime. Patient not taking: Reported on 11/24/2014 09/26/14   Layne Benton, NP   BP 109/47 mmHg  Pulse 128  Temp(Src) 99.6 F (37.6 C) (Rectal)  Resp 20  Ht  (1.499 m)  Wt 124 lb (56.246 kg)  BMI 25.03 kg/m2  SpO2 100% Physical Exam  Constitutional: She appears well-developed.  HENT:  Head: Atraumatic.  Eyes:   Right pupils somewhat constricted, left pupil post cataract repair and irregular  Neck: Neck supple.  Cardiovascular:  Tachycardia  Pulmonary/Chest: Breath sounds normal. No respiratory distress.  Abdominal: There is no tenderness.  Musculoskeletal: She exhibits no edema.  Neurological:  Minimally responsive. Will grip hands but not to command.  Skin:  Large sacral decubitus ulcer.    ED Course  Procedures (including critical care time) Labs Review Labs Reviewed  COMPREHENSIVE METABOLIC PANEL - Abnormal; Notable for the following:    Sodium 131 (*)    Chloride 97 (*)    Glucose, Bld 103 (*)    BUN 42 (*)    Total Protein 5.7 (*)    Albumin 2.2 (*)    ALT 11 (*)    GFR calc non  Af Amer 53 (*)    All other components within normal limits  CBC WITH DIFFERENTIAL/PLATELET - Abnormal; Notable for the following:    WBC 14.2 (*)    RBC 2.94 (*)    Hemoglobin 8.5 (*)    HCT 26.6 (*)    RDW 15.9 (*)    Neutro Abs 10.3 (*)    Monocytes Absolute 1.6 (*)    All other components within normal limits  URINALYSIS, ROUTINE W REFLEX MICROSCOPIC (NOT AT [facility]) - Abnormal; Notable for the following:    APPearance HAZY (*)    Hgb urine dipstick SMALL (*)    Protein, ur 30 (*)    Nitrite POSITIVE (*)    Leukocytes, UA MODERATE (*)    All other components within normal limits  URINE MICROSCOPIC-ADD ON - Abnormal; Notable for the following:    Bacteria, UA FEW (*)    Casts HYALINE CASTS (*)    All other components within normal limits  CULTURE, BLOOD (ROUTINE X 2)  CULTURE, BLOOD (ROUTINE X 2)  URINE CULTURE  I-STAT CG4 LACTIC ACID, ED  I-STAT TROPOININ, ED  I-STAT CG4 LACTIC ACID, ED    Imaging Review Ct Head Wo Contrast  04/27/2015   CLINICAL DATA:  Unresponsive  EXAM: CT HEAD WITHOUT CONTRAST  TECHNIQUE: Contiguous axial images were obtained from the base of the skull through the vertex without intravenous contrast.  COMPARISON:  11/25/2014  FINDINGS: Bony calvarium is intact.  Diffuse atrophic changes are seen. No findings to suggest acute hemorrhage, acute infarction or space-occupying mass lesion are identified. Mild chronic white matter ischemic change is seen.  IMPRESSION: Atrophic changes and chronic white matter ischemic change without acute abnormality.   Electronically Signed   By: Alcide CleverMark  Lukens M.D.   On: 04/27/2015 22:58   Dg Chest Port 1 View  04/27/2015   CLINICAL DATA:  Altered mental status for 2 days.  EXAM: PORTABLE CHEST - 1 VIEW  COMPARISON:  Chest radiograph 03/09/2015.  FINDINGS: Borderline cardiac enlargement. Prior CABG. No infiltrates or failure. No effusion or pneumothorax. Degenerative change both shoulders.  IMPRESSION: Stable chest.  No active infiltrates or failure.   Electronically Signed   By: Davonna BellingJohn  Curnes M.D.   On: 04/27/2015 19:59     EKG Interpretation   Date/Time:  Monday April 27 2015 18:52:56 EDT Ventricular Rate:  129 PR Interval:    QRS Duration: 91 QT Interval:  302 QTC Calculation: 442 R Axis:   -21 Text Interpretation:  Atrial fibrillation Borderline left axis deviation  Anterior infarct, old ST depression, probably rate related Confirmed by  Rubin PayorPICKERING  MD, Harrold DonathNATHAN [phone]([id number]) on 04/27/2015 11:47:41 PM      MDM   Final diagnoses:  Urinary tract infection without hematuria, site unspecified  Atrial fibrillation with rapid ventricular response    Patient with A. fib RVR likely due to urinary tract infection. Not febrile white count is elevated. Heart rate is improved with IV fluids. Normal lactic acid. Started on Zosyn and vancomycin for presumed urinary tract infection. Patient is a DO NOT RESUSCITATE and would not want intubation. Will admit to stepdown bed.  CRITICAL CARE Performed by: Billee CashingPICKERING,Nyelle Wolfson R. Total critical care time: 30 Critical care time was exclusive of separately billable procedures and treating other patients. Critical care was necessary to treat or prevent imminent or life-threatening  deterioration. Critical care was time spent personally by me on the following activities: development of treatment plan with patient and/or surrogate as well as nursing, discussions with  consultants, evaluation of patient's response to treatment, examination of patient, obtaining history from patient or surrogate, ordering and performing treatments and interventions, ordering and review of laboratory studies, ordering and review of radiographic studies, pulse oximetry and re-evaluation of patient's condition.    Benjiman Core, MD 04/27/15 2350

## 2015-04-27 NOTE — ED Notes (Signed)
Per EMS: Pt from [facility]. Pt began experiencing an altered LOC since Saturday. Staff obtained blood/urine samples and suspect UTI. Started pt on 1g Rocephin IM. Today staff noticed pt was "unresponsive." Pt responds to pain only. Staff reports a "blown L pupil." HR 140's, bp 110/60. 88% RA, placed on 4L O2, sats now at 98%.

## 2015-04-28 ENCOUNTER — Encounter (HOSPITAL_COMMUNITY): Payer: Self-pay | Admitting: Internal Medicine

## 2015-04-28 DIAGNOSIS — N390 Urinary tract infection, site not specified: Secondary | ICD-10-CM

## 2015-04-28 DIAGNOSIS — I4891 Unspecified atrial fibrillation: Secondary | ICD-10-CM | POA: Diagnosis present

## 2015-04-28 DIAGNOSIS — D649 Anemia, unspecified: Secondary | ICD-10-CM | POA: Diagnosis present

## 2015-04-28 DIAGNOSIS — L89154 Pressure ulcer of sacral region, stage 4: Secondary | ICD-10-CM | POA: Diagnosis present

## 2015-04-28 DIAGNOSIS — A419 Sepsis, unspecified organism: Secondary | ICD-10-CM | POA: Insufficient documentation

## 2015-04-28 DIAGNOSIS — E119 Type 2 diabetes mellitus without complications: Secondary | ICD-10-CM

## 2015-04-28 LAB — COMPREHENSIVE METABOLIC PANEL
ALBUMIN: 2.3 g/dL — AB (ref 3.5–5.0)
ALK PHOS: 68 U/L (ref 38–126)
ALT: 11 U/L — ABNORMAL LOW (ref 14–54)
ANION GAP: 10 (ref 5–15)
AST: 17 U/L (ref 15–41)
BUN: 34 mg/dL — AB (ref 6–20)
CO2: 23 mmol/L (ref 22–32)
CREATININE: 0.92 mg/dL (ref 0.44–1.00)
Calcium: 8.8 mg/dL — ABNORMAL LOW (ref 8.9–10.3)
Chloride: 102 mmol/L (ref 101–111)
GFR calc Af Amer: >60 mL/min (ref >60)
GFR calc non Af Amer: 55 mL/min — ABNORMAL LOW (ref >60)
Glucose, Bld: 88 mg/dL (ref 65–99)
Potassium: 3.8 mmol/L (ref 3.5–5.1)
SODIUM: 135 mmol/L (ref 135–145)
Total Bilirubin: 0.4 mg/dL (ref 0.3–1.2)
Total Protein: 5.9 g/dL — ABNORMAL LOW (ref 6.5–8.1)

## 2015-04-28 LAB — CBC WITH DIFFERENTIAL/PLATELET
BASOS ABS: 0 10*3/uL (ref 0.0–0.1)
Basophils Relative: 0 % (ref 0–1)
Eosinophils Absolute: 0.2 10*3/uL (ref 0.0–0.7)
Eosinophils Relative: 1 % (ref 0–5)
HCT: 31.6 % — ABNORMAL LOW (ref 36.0–46.0)
HEMOGLOBIN: 9.8 g/dL — AB (ref 12.0–15.0)
Lymphocytes Relative: 18 % (ref 12–46)
Lymphs Abs: 2.1 10*3/uL (ref 0.7–4.0)
MCH: 28 pg (ref 26.0–34.0)
MCHC: 31 g/dL (ref 30.0–36.0)
MCV: 90.3 fL (ref 78.0–100.0)
MONO ABS: 1.2 10*3/uL — AB (ref 0.1–1.0)
Monocytes Relative: 10 % (ref 3–12)
NEUTROS ABS: 8.2 10*3/uL — AB (ref 1.7–7.7)
NEUTROS PCT: 70 % (ref 43–77)
PLATELETS: 330 10*3/uL (ref 150–400)
RBC: 3.5 MIL/uL — ABNORMAL LOW (ref 3.87–5.11)
RDW: 16 % — AB (ref 11.5–15.5)
WBC: 11.6 10*3/uL — AB (ref 4.0–10.5)

## 2015-04-28 LAB — LACTIC ACID, PLASMA: LACTIC ACID, VENOUS: 1.2 mmol/L (ref 0.5–2.0)

## 2015-04-28 LAB — PROCALCITONIN: Procalcitonin: 0.28 ng/mL

## 2015-04-28 LAB — MRSA PCR SCREENING: MRSA BY PCR: NEGATIVE

## 2015-04-28 LAB — TROPONIN I: Troponin I: 0.06 ng/mL — ABNORMAL HIGH (ref <0.031)

## 2015-04-28 LAB — GLUCOSE, CAPILLARY
GLUCOSE-CAPILLARY: 115 mg/dL — AB (ref 65–99)
Glucose-Capillary: 99 mg/dL (ref 65–99)

## 2015-04-28 LAB — CBG MONITORING, ED
GLUCOSE-CAPILLARY: 116 mg/dL — AB (ref 65–99)
Glucose-Capillary: 77 mg/dL (ref 65–99)

## 2015-04-28 MED ORDER — OXYCODONE HCL 5 MG PO TABS: 2.5000 mg | ORAL_TABLET | Freq: Four times a day (QID) | ORAL | Status: DC | PRN

## 2015-04-28 MED ORDER — VITAMIN D 1000 UNITS PO TABS
2000.0000 [IU] | ORAL_TABLET | Freq: Every day | ORAL | Status: DC
  Administered 2015-04-28 – 2015-05-05 (×8): 2000 [IU] via ORAL
  Filled 2015-04-28 (×8): qty 2

## 2015-04-28 MED ORDER — ONDANSETRON HCL 4 MG PO TABS: 4.0000 mg | ORAL_TABLET | Freq: Four times a day (QID) | ORAL | Status: DC | PRN

## 2015-04-28 MED ORDER — ALLOPURINOL 100 MG PO TABS
100.0000 mg | ORAL_TABLET | Freq: Two times a day (BID) | ORAL | Status: DC
  Administered 2015-04-28 – 2015-04-29 (×3): 100 mg via ORAL
  Filled 2015-04-28 (×3): qty 1

## 2015-04-28 MED ORDER — MORPHINE SULFATE (CONCENTRATE) 20 MG/ML PO SOLN: 5.0000 mg | Freq: Every day | ORAL | Status: DC

## 2015-04-28 MED ORDER — FERROUS SULFATE 220 (44 FE) MG/5ML PO ELIX
325.0000 mg | ORAL_SOLUTION | Freq: Every day | ORAL | Status: DC
  Administered 2015-04-28 – 2015-05-03 (×6): 325 mg
  Filled 2015-04-28 (×7): qty 7.4

## 2015-04-28 MED ORDER — SODIUM CHLORIDE 0.9 % IV SOLN
INTRAVENOUS | Status: DC
  Administered 2015-04-29 – 2015-04-30 (×3): via INTRAVENOUS
  Administered 2015-05-01: 60 mL/h via INTRAVENOUS
  Administered 2015-05-02: 1000 mL via INTRAVENOUS

## 2015-04-28 MED ORDER — AMANTADINE HCL 50 MG/5ML PO SYRP
100.0000 mg | ORAL_SOLUTION | Freq: Two times a day (BID) | ORAL | Status: DC
  Administered 2015-04-28 – 2015-05-05 (×15): 100 mg
  Filled 2015-04-28 (×18): qty 10

## 2015-04-28 MED ORDER — ACETAMINOPHEN 650 MG RE SUPP: 650.0000 mg | Freq: Four times a day (QID) | RECTAL | Status: DC | PRN

## 2015-04-28 MED ORDER — GLUCERNA 1.2 CAL PO LIQD
237.0000 mL | Freq: Four times a day (QID) | ORAL | Status: DC
  Administered 2015-04-28 – 2015-05-05 (×24): 237 mL
  Filled 2015-04-28 (×35): qty 237

## 2015-04-28 MED ORDER — ASPIRIN 325 MG PO TABS
325.0000 mg | ORAL_TABLET | Freq: Every day | ORAL | Status: DC
Start: 2015-04-28 — End: 2015-05-05
  Administered 2015-04-28 – 2015-05-05 (×8): 325 mg
  Filled 2015-04-28 (×8): qty 1

## 2015-04-28 MED ORDER — ONDANSETRON HCL 4 MG/2ML IJ SOLN: 4.0000 mg | Freq: Four times a day (QID) | INTRAMUSCULAR | Status: DC | PRN

## 2015-04-28 MED ORDER — AMIODARONE HCL 100 MG PO TABS
100.0000 mg | ORAL_TABLET | Freq: Every day | ORAL | Status: DC
  Administered 2015-04-28 – 2015-05-05 (×8): 100 mg
  Filled 2015-04-28 (×8): qty 1

## 2015-04-28 MED ORDER — FAMOTIDINE 40 MG/5ML PO SUSR
20.0000 mg | Freq: Every day | ORAL | Status: DC
  Administered 2015-04-28 – 2015-05-05 (×8): 20 mg
  Filled 2015-04-28 (×8): qty 2.5

## 2015-04-28 MED ORDER — LATANOPROST 0.005 % OP SOLN
1.0000 [drp] | Freq: Every day | OPHTHALMIC | Status: DC
  Administered 2015-04-29 – 2015-05-04 (×7): 1 [drp] via OPHTHALMIC
  Filled 2015-04-28: qty 2.5

## 2015-04-28 MED ORDER — MEDIHONEY WOUND/BURN DRESSING EX GEL: 1.0000 "application " | Freq: Every day | CUTANEOUS | Status: DC

## 2015-04-28 MED ORDER — INSULIN ASPART 100 UNIT/ML ~~LOC~~ SOLN
0.0000 [IU] | Freq: Three times a day (TID) | SUBCUTANEOUS | Status: DC
  Administered 2015-05-02 – 2015-05-03 (×3): 1 [IU] via SUBCUTANEOUS

## 2015-04-28 MED ORDER — PRO-STAT SUGAR FREE PO LIQD
30.0000 mL | Freq: Three times a day (TID) | ORAL | Status: DC
  Administered 2015-04-28 – 2015-04-29 (×5): 30 mL via ORAL
  Filled 2015-04-28 (×8): qty 30

## 2015-04-28 MED ORDER — METOPROLOL TARTRATE 25 MG PO TABS
25.0000 mg | ORAL_TABLET | Freq: Three times a day (TID) | ORAL | Status: DC
  Administered 2015-04-28 – 2015-05-05 (×20): 25 mg
  Filled 2015-04-28 (×20): qty 1

## 2015-04-28 MED ORDER — OCUVITE-LUTEIN PO CAPS
1.0000 | ORAL_CAPSULE | Freq: Every day | ORAL | Status: DC
  Administered 2015-04-28 – 2015-05-05 (×8): 1
  Filled 2015-04-28 (×8): qty 1

## 2015-04-28 MED ORDER — MORPHINE SULFATE (CONCENTRATE) 10 MG/0.5ML PO SOLN
5.0000 mg | Freq: Every day | ORAL | Status: DC
  Administered 2015-04-29 – 2015-05-05 (×7): 5 mg
  Filled 2015-04-28 (×7): qty 0.5

## 2015-04-28 MED ORDER — METOCLOPRAMIDE HCL 5 MG/5ML PO SOLN
5.0000 mg | Freq: Three times a day (TID) | ORAL | Status: DC
  Administered 2015-04-28 – 2015-05-05 (×29): 5 mg
  Filled 2015-04-28 (×33): qty 5

## 2015-04-28 MED ORDER — ENOXAPARIN SODIUM 40 MG/0.4ML ~~LOC~~ SOLN
40.0000 mg | SUBCUTANEOUS | Status: DC
  Administered 2015-04-28 – 2015-05-04 (×7): 40 mg via SUBCUTANEOUS
  Filled 2015-04-28 (×8): qty 0.4

## 2015-04-28 MED ORDER — ZINC SULFATE 220 (50 ZN) MG PO CAPS
220.0000 mg | ORAL_CAPSULE | Freq: Every day | ORAL | Status: DC
  Administered 2015-04-28 – 2015-05-05 (×8): 220 mg
  Filled 2015-04-28 (×8): qty 1

## 2015-04-28 MED ORDER — PIPERACILLIN-TAZOBACTAM 3.375 G IVPB
3.3750 g | Freq: Three times a day (TID) | INTRAVENOUS | Status: DC
  Administered 2015-04-28 – 2015-05-01 (×10): 3.375 g via INTRAVENOUS
  Filled 2015-04-28 (×13): qty 50

## 2015-04-28 MED ORDER — SODIUM CHLORIDE 0.9 % IV SOLN
INTRAVENOUS | Status: AC
  Administered 2015-04-28: 05:00:00 via INTRAVENOUS

## 2015-04-28 MED ORDER — ACETAMINOPHEN 325 MG PO TABS: 650.0000 mg | ORAL_TABLET | Freq: Four times a day (QID) | ORAL | Status: DC | PRN

## 2015-04-28 NOTE — ED Notes (Signed)
Rebekah LPN from [facility] called for update- per family okay to update

## 2015-04-28 NOTE — H&P (Signed)
[facility] History and Physical  Grey Schlauch ZOX:[id number] DOB: 1928/10/29 DOA: 04/27/2015  Referring physician: Dr.Pickering. PCP: Georgann Housekeeper, MD  Specialists: None.  Chief Complaint: Altered mental status.  HPI: Susan Calhoun is a 81 y.o. female with history of atrial fibrillation and DVT nor any anticoagulation secondary to history of intracranial bleed, diabetes mellitus type 2, dysphagia with PEG tube was brought to the ER after patient was found to have increasing confusion and lethargic. As per the patient's daughter patient was treated recently for UTI 2 days ago with ceftriaxone. Patient also is on pain medication when they cleaned her stage IV sacral decubitus ulcer. In the ER patient was found to be hypotensive and A. fib with RVR. Patient's blood pressure and heart rate improved with fluid bolus. UA shows features consistent with UTI. CT head is negative for anything acute. After arrival to the ER patient has become more alert and awake and at this time is responding to her name. Patient still is feeling weak and as per the patient's daughter patient is not back to her baseline yet.   Review of Systems: As presented in the history of presenting illness, rest negative.  Past Medical History  Diagnosis Date  . Diabetes mellitus with diabetic polyneuropathy   . A-fib   . Renal disorder   . Hypertension   . Anemia   . Glaucoma     right eye   . Carotid artery stenosis   . PVD (peripheral vascular disease)    Past Surgical History  Procedure Laterality Date  . Replacement total knee Right   . Carotid endarterectomy  2015  . Coronary artery bypass graft    . Ablation      cardiac for arrthymia    Social History:  reports that she has never smoked. She has never used smokeless tobacco. She reports that she does not drink alcohol or use illicit drugs. Where does patient live nursing home. Can patient participate in ADLs? No.  Allergies  Allergen  Reactions  . Septra [Sulfamethoxazole-Trimethoprim] Hives    Family History: History reviewed. No pertinent family history.    Prior to Admission medications   Medication Sig Start Date End Date Taking? Authorizing Provider  allopurinol (ZYLOPRIM) 100 MG tablet Take 100 mg by mouth 2 (two) times daily.    Yes Historical Provider, MD  amantadine (SYMMETREL) 50 MG/5ML solution Place 10 mLs (100 mg total) into feeding tube 2 (two) times daily. 10/21/14  Yes Catarina Hartshorn, MD  Amino Acids-Protein Hydrolys (FEEDING SUPPLEMENT, PRO-STAT SUGAR FREE 64,) LIQD Take 30 mLs by mouth 3 (three) times daily with meals.   Yes Historical Provider, MD  amiodarone (PACERONE) 100 MG tablet Place 1 tablet (100 mg total) into feeding tube daily. 11/28/14  Yes Tora Kindred York, PA-C  aspirin 325 MG tablet Place 1 tablet (325 mg total) into feeding tube daily. 10/21/14  Yes Catarina Hartshorn, MD  cefTRIAXone (ROCEPHIN) 1 G injection Inject 1 g into the muscle daily. For 3 days only. Started on 04-26-15   Yes Historical Provider, MD  Cholecalciferol (VITAMIN D3) 2000 UNITS TABS Give 2,000 Units by tube daily.    Yes Historical Provider, MD  COLLAGEN MATRIX-SILVER EX Apply 1 application topically daily.   Yes Historical Provider, MD  Cranberry 450 MG TABS Take 450 mg by mouth 2 (two) times daily.   Yes Historical Provider, MD  Cyanocobalamin (VITAMIN B-12 PO) Give 1 tablet by tube daily.    Yes Historical Provider, MD  famotidine (PEPCID) 40  MG/5ML suspension Place 2.5 mLs (20 mg total) into feeding tube daily. 11/28/14  Yes Marianne L York, PA-C  ferrous sulfate 220 (44 FE) MG/5ML solution Place 325 mg into feeding tube daily. 7.35ml's   Yes Historical Provider, MD  furosemide (LASIX) 20 MG tablet Give 20 mg by tube daily.    Yes Historical Provider, MD  insulin aspart (NOVOLOG) 100 UNIT/ML injection Inject 0-5 Units into the skin at bedtime. 11/28/14  Yes Marianne L York, PA-C  insulin aspart (NOVOLOG) 100 UNIT/ML injection Inject 0-9  Units into the skin 3 (three) times daily with meals. 11/28/14  Yes Marianne L York, PA-C  lisinopril (PRINIVIL,ZESTRIL) 5 MG tablet Take 1 tablet (5 mg total) by mouth daily. 12/01/14  Yes Albertine Grates, MD  metoCLOPramide (REGLAN) 5 MG/5ML solution Place 5 mg into feeding tube 4 (four) times daily -  before meals and at bedtime.   Yes Historical Provider, MD  metoprolol tartrate (LOPRESSOR) 25 MG tablet Place 1 tablet (25 mg total) into feeding tube 3 (three) times daily. 12/01/14  Yes Albertine Grates, MD  morphine (ROXANOL) 20 MG/ML concentrated solution Place 5 mg into feeding tube daily. Prior to wound care   Yes Historical Provider, MD  Multiple Vitamins-Minerals (DECUBI-VITE) CAPS Give 1 capsule by tube daily.    Yes Historical Provider, MD  Nutritional Supplements (FEEDING SUPPLEMENT, GLUCERNA 1.2 CAL,) LIQD Place 237 mLs into feeding tube 4 (four) times daily.    Yes Historical Provider, MD  oxyCODONE (OXY IR/ROXICODONE) 5 MG immediate release tablet Take 1 tablet (5 mg total) by mouth every 4 (four) hours as needed for moderate pain. 03/10/15  Yes Kathlen Mody, MD  pregabalin (LYRICA) 25 MG capsule Give 25 mg by tube daily.    Yes Historical Provider, MD  Travoprost, BAK Free, (TRAVATAN) 0.004 % SOLN ophthalmic solution Place 1 drop into the right eye at bedtime.   Yes Historical Provider, MD  vitamin C (ASCORBIC ACID) 500 MG tablet Give 500 mg by tube daily.   Yes Historical Provider, MD  Wound Dressings (MEDIHONEY WOUND/BURN DRESSING) GEL Apply 1 application topically daily.   Yes Historical Provider, MD  Zinc 30 MG TABS Give 60 mg by tube daily.   Yes Historical Provider, MD  feeding supplement, ENSURE COMPLETE, (ENSURE COMPLETE) LIQD Take 237 mLs by mouth 3 (three) times daily with meals. Patient not taking: Reported on 11/24/2014 09/30/14   Marvel Plan, MD  Nutritional Supplements (FEEDING SUPPLEMENT, VITAL AF 1.2 CAL,) LIQD Place 270 mLs into feeding tube 4 (four) times daily - after meals and at  bedtime. Patient not taking: Reported on 11/24/2014 09/26/14   Layne Benton, NP    Physical Exam: Filed Vitals:   04/28/15 0130 04/28/15 0145 04/28/15 0200 04/28/15 0215  BP: 97/30 116/43 122/48 140/48  Pulse: 107 61 108 101  Temp:      TempSrc:      Resp: Height:      Weight:      SpO2: 100% 100% 100% 100%     General:  Moderately built and poorly nourished.  Eyes: Anicteric no pallor.  ENT: No discharge from the ears eyes nose and mouth.  Neck: No mass felt.  Cardiovascular: S1 and S2 heard.  Respiratory: No rhonchi or crepitations.  Abdomen: Soft nontender bowel sounds present. PEG tube seen.  Skin: Stage IV decubitus ulcer in the sacral and heel ulcers.  Musculoskeletal: No edema.  Psychiatric: Patient is confused.  Neurologic: Oriented to her  name only. Moves all extremities.  Labs on Admission:  Basic Metabolic Panel:  Recent Labs Lab 04/27/15 2011  NA 131*  K 3.9  CL 97*  CO2 25  GLUCOSE 103*  BUN 42*  CREATININE 0.95  CALCIUM 8.9   Liver Function Tests:  Recent Labs Lab 04/27/15 2011  AST 15  ALT 11*  ALKPHOS 71  BILITOT 0.5  PROT 5.7*  ALBUMIN 2.2*   No results for input(s): LIPASE, AMYLASE in the last 168 hours. No results for input(s): AMMONIA in the last 168 hours. CBC:  Recent Labs Lab 04/27/15 2011  WBC 14.2*  NEUTROABS 10.3*  HGB 8.5*  HCT 26.6*  MCV 90.5  PLT 350   Cardiac Enzymes: No results for input(s): CKTOTAL, CKMB, CKMBINDEX, TROPONINI in the last 168 hours.  BNP (last 3 results) No results for input(s): BNP in the last 8760 hours.  ProBNP (last 3 results)  Recent Labs  09/16/14 1730  PROBNP 9594.0*    CBG: No results for input(s): GLUCAP in the last 168 hours.  Radiological Exams on Admission: Ct Head Wo Contrast  04/27/2015   CLINICAL DATA:  Unresponsive  EXAM: CT HEAD WITHOUT CONTRAST  TECHNIQUE: Contiguous axial images were obtained from the base of the skull through the vertex  without intravenous contrast.  COMPARISON:  11/25/2014  FINDINGS: Bony calvarium is intact. Diffuse atrophic changes are seen. No findings to suggest acute hemorrhage, acute infarction or space-occupying mass lesion are identified. Mild chronic white matter ischemic change is seen.  IMPRESSION: Atrophic changes and chronic white matter ischemic change without acute abnormality.   Electronically Signed   By: Alcide CleverMark  Lukens M.D.   On: 04/27/2015 22:58   Dg Chest Port 1 View  04/27/2015   CLINICAL DATA:  Altered mental status for 2 days.  EXAM: PORTABLE CHEST - 1 VIEW  COMPARISON:  Chest radiograph 03/09/2015.  FINDINGS: Borderline cardiac enlargement. Prior CABG. No infiltrates or failure. No effusion or pneumothorax. Degenerative change both shoulders.  IMPRESSION: Stable chest.  No active infiltrates or failure.   Electronically Signed   By: Davonna BellingJohn  Curnes M.D.   On: 04/27/2015 19:59    EKG: Independently reviewed. A. fib with RVR.  Assessment/Plan Principal Problem:   Sepsis Active Problems:   Acute encephalopathy   Urinary tract infection   Atrial fibrillation with RVR   Chronic anemia   Diabetes mellitus type 2, controlled   Decubitus ulcer of sacral region, stage 4   1. Sepsis from UTI -patient has been placed on empiric antibiotics. Follow blood cultures and urine cultures. Continue with hydration. 2. Acute encephalopathy probably from sepsis - CT head is unremarkable and as per patient's daughter patient mental status has improved after coming to the ER. Closely observe. I am decreasing patient's oxycodone dose from 5 mg to 2.5 mg. 3. A. fib with RVR - improved after fluid bolus. Continue rate limiting medications. Not on any anticoagulate him secondary to intracranial hemorrhage.  4. Hypertension - holding antihypertensives because patient was initially hypotensive. 5. Chronic sacral decubitus ulcers and heel ulcers - wound consult requested. Ulcers don't look infected. 6. Chronic anemia -  follow CBC. 7. Diabetes mellitus type 2 on sliding scale coverage which will be continued. Closely follow CBGs.   DVT ProphylaxisLovenox.  Code Status: DO NOT RESUSCITATE.  Family Communication: Patient's daughter.  Disposition Plan: Admit to inpatient.    KAKRAKANDY,ARSHAD N. [facility] Pager [phone].  If 7PM-7AM, please contact night-coverage [email or web address] Password [email or web address] 04/28/2015, 2:31 AM

## 2015-04-28 NOTE — ED Notes (Signed)
RN left message for WOC nurse to see patient, called [phone]

## 2015-04-28 NOTE — Clinical Social Work Note (Signed)
Clinical Social Work Assessment  Patient Details  Name: Susan Calhoun MRN: [id number] Date of Birth: 06/21/1928  Date of referral:  04/28/15               Reason for consult:  Other (Comment Required) (From a SNF (long term resident))                Permission sought to share information with:  Case Manager, Magazine features editoracility Contact Representative, Family Supports Permission granted to share information::  Yes, Verbal Permission Granted  Name::     Daughter Maureen RalphsVivian  (at the bedside in the ED)  Agency::  [facility] (current SNF)  Relationship::     Contact Information:     Housing/Transportation Living arrangements for the past 2 months:  Skilled Nursing Facility Source of Information:  Patient, Medical Team, Adult Children, Facility Patient Interpreter Needed:  None Criminal Activity/Legal Involvement Pertinent to Current Situation/Hospitalization:  No - Comment as needed Significant Relationships:  Adult Children, Other Family Members Lives with:  Facility Resident Do you feel safe going back to the place where you live?  Yes Need for family participation in patient care:  Yes (Comment) (due to AMS, patient daughter helping with decision making)  Care giving concerns:  None at this time. Patient is a facility resident, and has been placed in SNF long term.  No barriers per facility at this time   Office managerocial Worker assessment / plan:  LCSW made aware patient is from a SNF.  Patient here due to UTI and sepsis along with AMS. Daughter is providing assistance with care for patient, however [facility].  Called facility while patient in ED and made them aware of patient being admitted. Business office has spoken to daughter in effort to hold bed.  NO barriers returning at this time. LCSW will updated FL2 in ED, will need FL2 updated prior to DC.  Plan will be DC back to SNF once medically stable.  Employment status:  Retired Product/process development scientistnsurance information:  Managed  Medicare PT Recommendations:  Skilled Nursing Facility Information / Referral to community resources:  Skilled Nursing Facility  Patient/Family's Response to care:  Agreeable to plan  Patient/Family's Understanding of and Emotional Response to Diagnosis, Current Treatment, and Prognosis:  Patient not able to participate in assessment due to AMS. Daughter agreeable and understanding of reasons for admitted.   Emotional Assessment Appearance:  Appears stated age Attitude/Demeanor/Rapport:  Other (Altered Mental Status, resting comfortably) Affect (typically observed):  Adaptable, Stable Orientation:  Oriented to Self Alcohol / Substance use:  Not Applicable Psych involvement (Current and /or in the community):  No (Comment)  Discharge Needs  Concerns to be addressed:  Adjustment to Illness, No discharge needs identified Readmission within the last 30 days:  No Current discharge risk:  None Barriers to Discharge:  Continued Medical Work up   Raye SorrowCoble, Bartlett Enke N, LCSW 04/28/2015, 10:53 AM

## 2015-04-28 NOTE — ED Notes (Signed)
Ordered diet tray 

## 2015-04-28 NOTE — Progress Notes (Signed)
CRITICAL VALUE ALERT  Critical value received:  Blood culture, anaerobic, positive for gram positive cocci in clusters  Date of notification:  04/28/15  Time of notification:  2122  Critical value read back:Yes.    Nurse who received alert:  Annabell Howells. Tamey Wanek, RN  MD notified (1st page):  MD not notified at this time.  Gram positive coverage is already ordered with IV piperacillin/tazobactam and vancomycin currently infusing.  Will notify NP on-call to relay to rounding MD in AM.

## 2015-04-28 NOTE — Progress Notes (Signed)
[facility] TEAM 1 - Stepdown/ICU TEAM Progress Note  Kenzi Bardwell EAV:[id number] DOB: 1928/03/12 DOA: 04/27/2015 PCP: Georgann Housekeeper, MD  Admit HPI / Brief Narrative: Susan Calhoun is a 81 y.o. BF PMHx Atrial fibrillation and DVT not on anticoagulation secondary to hemorrhagic stroke October 2015, diabetes mellitus type 2, dysphagia with PEG tube, Stage IV sacral decubitus ulcer   Brought to the ER after patient was found to have increasing confusion and lethargic. As per the patient's daughter patient was treated recently for UTI 2 days ago with ceftriaxone. Patient also is on pain medication when they cleaned her stage IV sacral decubitus ulcer. In the ER patient was found to be hypotensive and A. fib with RVR. Patient's blood pressure and heart rate improved with fluid bolus. UA shows features consistent with UTI. CT head is negative for anything acute. After arrival to the ER patient has become more alert and awake and at this time is responding to her name. Patient still is feeling weak and as per the patient's daughter patient is not back to her baseline yet.    HPI/Subjective: 6/7  Patient alert, tracks you with her eyes, responds to some commands.  Assessment/Plan: Sepsis secondary to UTI  -Continue empiric antibiotics  -Blood culture and urine cultures pending -Normal saline 63ml/hr  Acute encephalopathy -Most likely multifactorial to include hemorrhagic stroke October 2015,Sepsis, and possible overmedication; per daughter patient's baseline is answering questions appropriately, feeding herself. -CT head is unremarkable - decreasing patient's oxycodone dose from 5 mg to 2.5 mg.  A. fib with RVR  - improved after fluid bolus. Continue rate limiting medications.  -Not on any anticoagulate him secondary to intracranial hemorrhage.   Hypertension  - holding antihypertensives because patient was initially hypotensive.  Chronic sacral decubitus ulcers and heel ulcers  -  wound consult requested. Ulcers don't look infected. -Apply  Space Boots  Chronic anemia  - follow CBC. -Anemia panel pending  Diabetes mellitus type 2  -Continue sensitive SSI  -Hemoglobin A1c pending -Lipid panel pending     Code Status: FULL Family Communication: Mother and granddaughter present at time of exam Disposition Plan: Resolution of sepsis    Consultants: Wound care  Procedure/Significant Events:    Culture   Antibiotics: Zosyn 6/7>> Vancomycin 6/7>>  DVT prophylaxis: Lovenox   Devices    LINES / TUBES:      Continuous Infusions: . sodium chloride 75 mL/hr at 04/28/15 0432  . piperacillin-tazobactam (ZOSYN)  IV 3.375 g (04/28/15 0432)    Objective: VITAL SIGNS: BP: 127/49 mmHg (06/07 0745) Pulse Rate: 90 (06/07 0745) SPO2; FIO2:   Intake/Output Summary (Last 24 hours) at 04/28/15 0856 Last data filed at 04/28/15 0608  Gross per 24 hour  Intake      0 ml  Output    820 ml  Net   -820 ml     Exam: General:Patient alert, tracks you with her eyes, responds to some commands, No acute respiratory distress Eyes:  retinal hemorrhage, he was equal round reactive to light and accommodation ENT: Negative Runny nose, negative gingival bleeding, Neck:  Negative scars, masses, torticollis, lymphadenopathy, JVD Lungs: Clear to auscultation bilaterally without wheezes or crackles Cardiovascular: Regular rate and rhythm without murmur gallop or rub normal S1 and S2 Abdomen:negative abdominal pain, negative dysphagia, Nontender, nondistended, soft, bowel sounds positive, no rebound, no ascites, no appreciable mass, PEG tube present negative sign of infection Extremities: No significant cyanosis, bilateral lower extremity plantar flexion Psychiatric:  Unable to assess Neurologic:  Unable  to fully assess patient would follow some commands.     Data Reviewed: Basic Metabolic Panel:  Recent Labs Lab 04/27/15 2011 04/28/15 0344  NA 131*  135  K 3.9 3.8  CL 97* 102  CO2 25 23  GLUCOSE 103* 88  BUN 42* 34*  CREATININE 0.95 0.92  CALCIUM 8.9 8.8*   Liver Function Tests:  Recent Labs Lab 04/27/15 2011 04/28/15 0344  AST 15 17  ALT 11* 11*  ALKPHOS 71 68  BILITOT 0.5 0.4  PROT 5.7* 5.9*  ALBUMIN 2.2* 2.3*   No results for input(s): LIPASE, AMYLASE in the last 168 hours. No results for input(s): AMMONIA in the last 168 hours. CBC:  Recent Labs Lab 04/27/15 2011 04/28/15 0344  WBC 14.2* 11.6*  NEUTROABS 10.3* 8.2*  HGB 8.5* 9.8*  HCT 26.6* 31.6*  MCV 90.5 90.3  PLT 350 330   Cardiac Enzymes:  Recent Labs Lab 04/28/15 0344  TROPONINI 0.06*   BNP (last 3 results) No results for input(s): BNP in the last 8760 hours.  ProBNP (last 3 results)  Recent Labs  09/16/14 1730  PROBNP 9594.0*    CBG:  Recent Labs Lab 04/28/15 0852  GLUCAP 77    No results found for this or any previous visit (from the past 240 hour(s)).   Studies:  Recent x-ray studies have been reviewed in detail by the Attending Physician  Scheduled Meds:  Scheduled Meds: . allopurinol  100 mg Oral BID  . amantadine  100 mg Per Tube BID  . amiodarone  100 mg Per Tube Daily  . aspirin  325 mg Per Tube Daily  . cholecalciferol  2,000 Units Oral Daily  . enoxaparin (LOVENOX) injection  40 mg Subcutaneous Q24H  . famotidine  20 mg Per Tube Daily  . feeding supplement (GLUCERNA 1.2 CAL)  237 mL Per Tube QID  . feeding supplement (PRO-STAT SUGAR FREE 64)  30 mL Oral TID WC  . ferrous sulfate  325 mg Per Tube Daily  . insulin aspart  0-9 Units Subcutaneous TID WC  . latanoprost  1 drop Right Eye QHS  . metoCLOPramide  5 mg Per Tube TID AC & HS  . metoprolol tartrate  25 mg Per Tube TID  . morphine  5 mg Per Tube Daily  . multivitamin-lutein  1 capsule Per Tube Daily  . vancomycin  750 mg Intravenous Q24H  . zinc sulfate  220 mg Per Tube Daily    Time spent on care of this patient: 40 mins   Jazzmine Kleiman, Roselind MessierURTIS J ,  MD  [facility] Office  [phone] Pager - [phone]  On-Call/Text Page:      [email or web address]      password [email or web address]  If 7PM-7AM, please contact night-coverage [email or web address] Password [email or web address] 04/28/2015, 8:56 AM   LOS: 1 day   Care during the described time interval was provided by me .  I have reviewed this patient's available data, including medical history, events of note, physical examination, and all test results as part of my evaluation. I have personally reviewed and interpreted all radiology studies.   Carolyne Littlesurtis Alberto Pina, MD [phone] Pager

## 2015-04-28 NOTE — ED Notes (Signed)
Contacted Main Pharm[facility] to verify and send daily meds.

## 2015-04-28 NOTE — ED Notes (Signed)
Transferred pt to air mattress.

## 2015-04-28 NOTE — Progress Notes (Signed)
Utilization review complete. Dua Mehler RN CCM Case Mgmt phone [phone] 

## 2015-04-28 NOTE — ED Notes (Signed)
Admitting at bedside 

## 2015-04-28 NOTE — Progress Notes (Signed)
81yo female started on IV ABX for suspected sepsis to broaden coverage w/ Zosyn.  Will start Zosyn 3.375g IV Q8H for CrCl ~30 ml/min and monitor CBC, Cx.  Vernard GamblesVeronda Alessander Sikorski, PharmD, BCPS 04/28/2015 3:21 AM

## 2015-04-28 NOTE — Progress Notes (Signed)
Pt admitted from the ED. Pt to 3W, peri/foley care complete, MRSA SWAB, CHG complete, wet to dry dressing completed on stage 4 on sacrum. IV antibiotics infusing. Monique her granddaughter is at the bedside. VSS will continue to monitor. Cecille Rubinhompson,Sueann Brownley V, RN

## 2015-04-28 NOTE — ED Notes (Signed)
Admitting MD at bedside.

## 2015-04-28 NOTE — ED Notes (Signed)
Attempted report 

## 2015-04-29 DIAGNOSIS — A419 Sepsis, unspecified organism: Principal | ICD-10-CM

## 2015-04-29 DIAGNOSIS — N390 Urinary tract infection, site not specified: Secondary | ICD-10-CM

## 2015-04-29 DIAGNOSIS — L89154 Pressure ulcer of sacral region, stage 4: Secondary | ICD-10-CM

## 2015-04-29 DIAGNOSIS — E440 Moderate protein-calorie malnutrition: Secondary | ICD-10-CM | POA: Insufficient documentation

## 2015-04-29 LAB — COMPREHENSIVE METABOLIC PANEL
ALBUMIN: 2 g/dL — AB (ref 3.5–5.0)
ALT: 13 U/L — ABNORMAL LOW (ref 14–54)
ANION GAP: 9 (ref 5–15)
AST: 18 U/L (ref 15–41)
Alkaline Phosphatase: 67 U/L (ref 38–126)
BUN: 33 mg/dL — ABNORMAL HIGH (ref 6–20)
CO2: 22 mmol/L (ref 22–32)
Calcium: 8.9 mg/dL (ref 8.9–10.3)
Chloride: 107 mmol/L (ref 101–111)
Creatinine, Ser: 0.75 mg/dL (ref 0.44–1.00)
GFR calc Af Amer: >60 mL/min (ref >60)
GLUCOSE: 106 mg/dL — AB (ref 65–99)
Potassium: 3.2 mmol/L — ABNORMAL LOW (ref 3.5–5.1)
Sodium: 138 mmol/L (ref 135–145)
Total Bilirubin: 0.3 mg/dL (ref 0.3–1.2)
Total Protein: 5.6 g/dL — ABNORMAL LOW (ref 6.5–8.1)

## 2015-04-29 LAB — LIPID PANEL
CHOL/HDL RATIO: 6.2 ratio
CHOLESTEROL: 198 mg/dL (ref 0–200)
HDL: 32 mg/dL — ABNORMAL LOW (ref >40)
LDL Cholesterol: 141 mg/dL — ABNORMAL HIGH (ref 0–99)
Triglycerides: 127 mg/dL (ref <150)
VLDL: 25 mg/dL (ref 0–40)

## 2015-04-29 LAB — GLUCOSE, CAPILLARY
GLUCOSE-CAPILLARY: 89 mg/dL (ref 65–99)
GLUCOSE-CAPILLARY: 111 mg/dL — AB (ref 65–99)
Glucose-Capillary: 131 mg/dL — ABNORMAL HIGH (ref 65–99)
Glucose-Capillary: 119 mg/dL — ABNORMAL HIGH (ref 65–99)

## 2015-04-29 LAB — CBC WITH DIFFERENTIAL/PLATELET
Basophils Absolute: 0 10*3/uL (ref 0.0–0.1)
Basophils Relative: 0 % (ref 0–1)
Eosinophils Absolute: 0.1 10*3/uL (ref 0.0–0.7)
Eosinophils Relative: 1 % (ref 0–5)
HCT: 28.9 % — ABNORMAL LOW (ref 36.0–46.0)
HEMOGLOBIN: 9 g/dL — AB (ref 12.0–15.0)
LYMPHS ABS: 1.9 10*3/uL (ref 0.7–4.0)
Lymphocytes Relative: 20 % (ref 12–46)
MCH: 28.2 pg (ref 26.0–34.0)
MCHC: 31.1 g/dL (ref 30.0–36.0)
MCV: 90.6 fL (ref 78.0–100.0)
MONO ABS: 0.9 10*3/uL (ref 0.1–1.0)
MONOS PCT: 10 % (ref 3–12)
NEUTROS ABS: 6.8 10*3/uL (ref 1.7–7.7)
NEUTROS PCT: 69 % (ref 43–77)
Platelets: 358 10*3/uL (ref 150–400)
RBC: 3.19 MIL/uL — AB (ref 3.87–5.11)
RDW: 16 % — AB (ref 11.5–15.5)
WBC: 9.8 10*3/uL (ref 4.0–10.5)

## 2015-04-29 LAB — RETICULOCYTES
RBC.: 3.19 MIL/uL — ABNORMAL LOW (ref 3.87–5.11)
RETIC COUNT ABSOLUTE: 47.9 10*3/uL (ref 19.0–186.0)
Retic Ct Pct: 1.5 % (ref 0.4–3.1)

## 2015-04-29 LAB — CLOSTRIDIUM DIFFICILE BY PCR: Toxigenic C. Difficile by PCR: POSITIVE — AB

## 2015-04-29 LAB — VITAMIN B12: Vitamin B-12: 1308 pg/mL — ABNORMAL HIGH (ref 180–914)

## 2015-04-29 LAB — IRON AND TIBC
Iron: 13 ug/dL — ABNORMAL LOW (ref 28–170)
SATURATION RATIOS: 6 % — AB (ref 10.4–31.8)
TIBC: 204 ug/dL — ABNORMAL LOW (ref 250–450)
UIBC: 191 ug/dL

## 2015-04-29 LAB — FOLATE: Folate: 24.6 ng/mL (ref >5.9)

## 2015-04-29 LAB — MAGNESIUM: MAGNESIUM: 1.7 mg/dL (ref 1.7–2.4)

## 2015-04-29 LAB — FERRITIN: FERRITIN: 166 ng/mL (ref 11–307)

## 2015-04-29 MED ORDER — ALLOPURINOL 100 MG PO TABS
100.0000 mg | ORAL_TABLET | Freq: Two times a day (BID) | ORAL | Status: DC
  Administered 2015-04-29 – 2015-05-05 (×12): 100 mg
  Filled 2015-04-29 (×12): qty 1

## 2015-04-29 MED ORDER — PRO-STAT SUGAR FREE PO LIQD
30.0000 mL | Freq: Three times a day (TID) | ORAL | Status: DC
  Administered 2015-04-29 – 2015-05-05 (×17): 30 mL
  Filled 2015-04-29 (×31): qty 30

## 2015-04-29 MED ORDER — ONDANSETRON HCL 4 MG PO TABS: 4.0000 mg | ORAL_TABLET | Freq: Four times a day (QID) | ORAL | Status: DC | PRN

## 2015-04-29 MED ORDER — ACETAMINOPHEN 650 MG RE SUPP: 650.0000 mg | Freq: Four times a day (QID) | RECTAL | Status: DC | PRN

## 2015-04-29 MED ORDER — ACETAMINOPHEN 325 MG PO TABS
650.0000 mg | ORAL_TABLET | Freq: Four times a day (QID) | ORAL | Status: DC | PRN
  Administered 2015-04-29 – 2015-05-05 (×3): 650 mg
  Filled 2015-04-29 (×3): qty 2

## 2015-04-29 MED ORDER — OXYCODONE HCL 5 MG PO TABS: 2.5000 mg | ORAL_TABLET | Freq: Four times a day (QID) | ORAL | Status: DC | PRN

## 2015-04-29 MED ORDER — METRONIDAZOLE 500 MG PO TABS: 500.0000 mg | ORAL_TABLET | Freq: Three times a day (TID) | ORAL | Status: DC

## 2015-04-29 MED ORDER — ONDANSETRON HCL 4 MG/2ML IJ SOLN
4.0000 mg | Freq: Four times a day (QID) | INTRAMUSCULAR | Status: DC | PRN
  Administered 2015-05-02: 4 mg via INTRAVENOUS
  Filled 2015-04-29: qty 2

## 2015-04-29 MED ORDER — METRONIDAZOLE 50 MG/ML ORAL SUSPENSION
500.0000 mg | Freq: Three times a day (TID) | ORAL | Status: DC
  Administered 2015-04-29 – 2015-05-05 (×18): 500 mg
  Filled 2015-04-29 (×24): qty 10

## 2015-04-29 MED ORDER — POTASSIUM CHLORIDE CRYS ER 20 MEQ PO TBCR
40.0000 meq | EXTENDED_RELEASE_TABLET | Freq: Once | ORAL | Status: AC
  Administered 2015-04-29: 40 meq via ORAL
  Filled 2015-04-29: qty 2

## 2015-04-29 NOTE — Progress Notes (Addendum)
[place] TEAM 1 - Stepdown/ICU TEAM Progress Note  Susan Calhoun ZOX:[id number] DOB: 1928-01-30 DOA: 04/27/2015 PCP: Georgann Housekeeper, MD  Admit HPI / Brief Narrative: 81 y.o. F w/ a Hx Atrial fibrillation and DVT not on anticoagulation secondary to hemorrhagic stroke October 2015, diabetes mellitus type 2, dysphagia with PEG tube, and Stage IV sacral decubitus ulcer who was brought to the ER after found to have increasing confusion and lethargy.  The patient had been treated recently for UTI with ceftriaxone.    In the ER patient was found to be hypotensive and in A. fib with RVR. Patient's blood pressure and heart rate improved with fluid boluses. UA was consistent with UTI. CT head was negative for acute findings.   HPI/Subjective: The patient is more alert this afternoon.  She is able to answer some simple questions.  Family at bedside report that she has improved but is not yet back to her baseline mental status.  She denies chest pain shortness of breath fevers chills nausea or vomiting but her review of systems is of questionable reliability due to persistent confusion.  Assessment/Plan:  Sepsis secondary to UTI -Continue empiric antibiotics - follow up urine culture which may in fact prove to be bland due to antibiotics dosed prior to admission  C diff colitis  -Flagyl initiated - course will need to be extended for 14 days post-discontinuation of other broad-spectrum antibiotics - floxacillin to be placed to protect pre-existing sacral wound from fecal contamination  1/2 blood cx + for gram+ cocci in clusters Suspicious for contaminant - cont Vanc until speciation available   Acute encephalopathy -appears to be TME due to UTI, C diff, sedating medications, and volume depletion -CT head is unremarkable -Mental status is slowly but consistently improving  Chronic A. fib with acute RVR  -improved after volume resuscitation  -not on anticoagulate secondary to intracranial  hemorrhage  Hypokalemia -Corrected and follow  Hypertension  -currently well controlled off medical tx due to volume depletion   Chronic sacral decubitus ulcers and heel ulcers  -wound consult requested - patient is followed at [facility] wound care center  Chronic anemia  -follow Hgb trend  -Anemia panel suggests anemia of chronic disease  Diabetes mellitus type 2  -CBG currently well-controlled -Hemoglobin A1c pending -Lipid panel pending  Code Status: NO CODE - DNR  Family Communication: Spoke with family at bedside at length Disposition Plan: cont current tx course - eventual return to SNF (?Sat or Monday)  Consultants: none  Procedures: none  Antibiotics: Zosyn 6/6 > Vancomycin 6/6 > Flagyl 6/8 >  DVT prophylaxis: Lovenox  Objective: Blood pressure 131/64, pulse 73, temperature 98.1 F (36.7 C), temperature source Oral, resp. rate 20, height  (1.499 m), weight 56.7 kg (125 lb), SpO2 97 %.  Intake/Output Summary (Last 24 hours) at 04/29/15 1530 Last data filed at 04/29/15 0838  Gross per 24 hour  Intake 1626.25 ml  Output    700 ml  Net 926.25 ml   Exam: General: No acute respiratory distress Lungs: Clear to auscultation bilaterally without wheezes or crackles Cardiovascular: Regular rate without murmur gallop or rub  Abdomen: Nontender, nondistended, soft, bowel sounds positive, no rebound, no ascites, no appreciable mass Extremities: No significant cyanosis, clubbing, or edema bilateral lower extremities - soft heel boots in place B LE   Data Reviewed: Basic Metabolic Panel:  Recent Labs Lab 04/27/15 2011 04/28/15 0344 04/29/15 0533  NA 131* 135 138  K 3.9 3.8 3.2*  CL 97* 102  107  CO2 GLUCOSE 103* 88 106*  BUN 42* 34* 33*  CREATININE 0.95 0.92 0.75  CALCIUM 8.9 8.8* 8.9  MG  --   --  1.7   Liver Function Tests:  Recent Labs Lab 04/27/15 2011 04/28/15 0344 04/29/15 0533  AST ALT 11* 11* 13*  ALKPHOS 71  68 67  BILITOT 0.5 0.4 0.3  PROT 5.7* 5.9* 5.6*  ALBUMIN 2.2* 2.3* 2.0*   CBC:  Recent Labs Lab 04/27/15 2011 04/28/15 0344 04/29/15 0533  WBC 14.2* 11.6* 9.8  NEUTROABS 10.3* 8.2* 6.8  HGB 8.5* 9.8* 9.0*  HCT 26.6* 31.6* 28.9*  MCV 90.5 90.3 90.6  PLT 350 330 358   Cardiac Enzymes:  Recent Labs Lab 04/28/15 0344  TROPONINI 0.06*   ProBNP (last 3 results)  Recent Labs  09/16/14 1730  PROBNP 9594.0*    CBG:  Recent Labs Lab 04/28/15 1347 04/28/15 1636 04/28/15 2125 04/29/15 0751 04/29/15 1141  GLUCAP 116* 99 115* 89 111*    Recent Results (from the past 240 hour(s))  Blood Culture (routine x 2)     Status: None (Preliminary result)   Collection Time: 04/27/15  8:02 PM  Result Value Ref Range Status   Specimen Description BLOOD RIGHT UPPER ARM  Final   Special Requests BOTTLES DRAWN AEROBIC ONLY 4CC  Final   Culture   Final           BLOOD CULTURE RECEIVED NO GROWTH TO DATE CULTURE WILL BE HELD FOR 5 DAYS BEFORE ISSUING A FINAL NEGATIVE REPORT Performed at [facility]    Report Status PENDING  Incomplete  Blood Culture (routine x 2)     Status: None (Preliminary result)   Collection Time: 04/27/15  8:11 PM  Result Value Ref Range Status   Specimen Description BLOOD RIGHT HAND  Final   Special Requests BOTTLES DRAWN AEROBIC AND ANAEROBIC 5CC EA  Final   Culture   Final    GRAM POSITIVE COCCI IN CLUSTERS Note: Gram Stain Report Called to,Read Back By and Verified With: CHRIS RALPH ON 6.7.2016 AT 9:22P BY WILEJ Performed at [facility]    Report Status PENDING  Incomplete  Urine culture     Status: None (Preliminary result)   Collection Time: 04/27/15  8:30 PM  Result Value Ref Range Status   Specimen Description URINE, RANDOM  Final   Special Requests NONE  Final   Culture   Final    Culture reincubated for better growth Performed at [facility]    Report Status PENDING  Incomplete  MRSA PCR Screening     Status:  None   Collection Time: 04/28/15  4:22 PM  Result Value Ref Range Status   MRSA by PCR NEGATIVE NEGATIVE Final    Comment:        The GeneXpert MRSA Assay (FDA approved for NASAL specimens only), is one component of a comprehensive MRSA colonization surveillance program. It is not intended to diagnose MRSA infection nor to guide or monitor treatment for MRSA infections.   Clostridium Difficile by PCR (not at [facility])     Status: Abnormal   Collection Time: 04/29/15  5:19 AM  Result Value Ref Range Status   C difficile by pcr POSITIVE (A) NEGATIVE Final    Comment: CRITICAL RESULT CALLED TO, READ BACK BY AND VERIFIED WITH: [facility] RN @ [phone] 04/29/15 LEONARD,A      Studies:  Recent x-ray studies have been  reviewed in detail by the Attending Physician  Scheduled Meds:  Scheduled Meds: . allopurinol  100 mg Oral BID  . amantadine  100 mg Per Tube BID  . amiodarone  100 mg Per Tube Daily  . aspirin  325 mg Per Tube Daily  . cholecalciferol  2,000 Units Oral Daily  . enoxaparin (LOVENOX) injection  40 mg Subcutaneous Q24H  . famotidine  20 mg Per Tube Daily  . feeding supplement (GLUCERNA 1.2 CAL)  237 mL Per Tube QID  . feeding supplement (PRO-STAT SUGAR FREE 64)  30 mL Oral TID WC  . ferrous sulfate  325 mg Per Tube Daily  . insulin aspart  0-9 Units Subcutaneous TID WC  . latanoprost  1 drop Right Eye QHS  . metoCLOPramide  5 mg Per Tube TID AC & HS  . metoprolol tartrate  25 mg Per Tube TID  . metroNIDAZOLE  500 mg Per Tube 3 times per day  . morphine CONCENTRATE  5 mg Per Tube Daily  . multivitamin-lutein  1 capsule Per Tube Daily  . piperacillin-tazobactam (ZOSYN)  IV  3.375 g Intravenous Q8H  . vancomycin  750 mg Intravenous Q24H  . zinc sulfate  220 mg Per Tube Daily    Time spent on care of this patient: 35 mins  Lonia BloodJeffrey T. Denesia Donelan, MD [facility] For Consults/Admissions - Flow Manager - [phone] Office  [phone]  Contact MD directly via  text page:      [email or web address]      password [email or web address]  04/29/2015, 3:30 PM   LOS: 2 days

## 2015-04-29 NOTE — Clinical Social Work Note (Signed)
CSW spoke with the pt's daughter Trilby LeaverViviana. Trilby LeaverViviana reported that she was unhappy with [facility]. [name] [facility] [place]. [facility] Heather informed CSW that they can not meet the pt's needs.   CSW received a called stating the pt's granddaughter would like to speak to the CSW regarding [facility]. CSW called Monique. Monique reported that she would like the pt SNF search to [place] the [place]. CSW extended SNF search.  Jiraiya Mcewan, MSW, LCSWA [phone]

## 2015-04-29 NOTE — Consult Note (Signed)
WOC wound consult note  Reason for Consult: patient from SNF with AMS. She has chronic indwelling FC, sacral pressure ulcer and bilateral heel ulcers.   Wound type: Stage IV Pressure Ulcer sacrum: 5cm x 6cm x 2cm x with tunnel at 4 o'clock that is 3.5cm  Left heel ulcer/achillies area: hard stable eschar, intact aprox 2cm x 2cm x 0 Right heel /achillles area: stable hard callous skin, intact aprox 2cm x 2cm x 0 Pressure Ulcer POA: Yes Measurement:see above Wound bed: Sacrum is clean, pink, non granular with epibole of the wound edges, probable reason for non healing at this point. She is stooling frequently which soils the dressing, do not think this is problematic as the wound is clean and does not appear infected.  Heels are dry and stable, no topical care needed  Drainage (amount, consistency, odor) none from the heels, sacrum unable to assess today as the wound was contaminated with stool  Periwound: intact  Dressing procedure/placement/frequency: Air mattress in place per nursing staff, appropriate for this patient with Stage IV pressure ulcer and limited movement.  It does not appears she moves at all, she has bilateral foot drop.  Continue Prevalon boots for offloading.  Normal saline moist gauze dressing BID for sacrum  Discussed POC with patient and bedside nurse.  Re consult if needed, will not follow at this time. Thanks  Kaladin Noseworthy [employer]in RN, CWOCN [phone])

## 2015-04-29 NOTE — Progress Notes (Signed)
Initial Nutrition Assessment  Documentation codes: Moderate (non-severe) malnutrition in the context of chronic illness INTERVENTION:   Continue bolus TF with Glucerna 1.2 - 1 can QID and Prostat 30 ml TID to provide 1440 kcals, 102 gm protein, 768 ml free water daily  Continue PO diet  NUTRITION DIAGNOSIS:  Increased nutrient needs related to wound healing as evidenced by estimated needs.  GOAL:  Patient will meet greater than or equal to 90% of their needs  MONITOR:  PO intake, Labs, Weight trends, TF tolerance  REASON FOR ASSESSMENT:  Low Braden; New TF    ASSESSMENT:  Patient admitted on 6/7 with altered mental status.   Patient with a stage 4 sacral pressure ulcer. She has a PEG and receives TF of Glucerna 1.2. Currently receiving boluses of Glucerna 1.2 - 1 can QID with Prostat 30 ml TID, meeting increased nutrition needs for wound healing. Per discussion with RN, patient is tolerating her TF well. She is eating very little of her PO diet, poor appetite.   Weight seems to be trending down; she has lost 7.5% weight in the past 2 months. Pt meets criteria for moderate MALNUTRITION in the context of chronic illness as evidenced by mild depletion of muscle mass and 7.5% weight loss within 3 months.   Height:  Ht Readings from Last 1 Encounters:  04/27/15 4\' 11"  (1.499 m)    Weight:  Wt Readings from Last 1 Encounters:  04/29/15 125 lb (56.7 kg)    Ideal Body Weight:  44.5 kg  Wt Readings from Last 10 Encounters:  04/29/15 125 lb (56.7 kg)  03/05/15 135 lb 2.3 oz (61.3 kg)  12/01/14 135 lb 2.3 oz (61.3 kg)  10/21/14 144 lb 6.4 oz (65.5 kg)  09/15/14 146 lb 2.6 oz (66.3 kg)  08/31/14 140 lb (63.504 kg)    BMI:  Body mass index is 25.23 kg/(m^2).  Estimated Nutritional Needs:  Kcal:  1500-1700  Protein:  90-100 gm  Fluid:  1.8 L  Skin:  Wound (see comment) (stage 4 pressure ulcer to coccyx)  Diet Order:  Diet heart healthy/carb modified Room  service appropriate?: Yes; Fluid consistency:: Thin; Fluid restriction:: 1200 mL Fluid  EDUCATION NEEDS:  Education needs no appropriate at this time   Intake/Output Summary (Last 24 hours) at 04/29/15 1302 Last data filed at 04/29/15 0838  Gross per 24 hour  Intake 1726.25 ml  Output    700 ml  Net 1026.25 ml    Last BM:  6/8   Joaquin CourtsKimberly Harris, RD, LDN, CNSC Pager [phone] After Hours Pager [phone]

## 2015-04-30 DIAGNOSIS — G9340 Encephalopathy, unspecified: Secondary | ICD-10-CM

## 2015-04-30 LAB — BASIC METABOLIC PANEL
Anion gap: 9 (ref 5–15)
BUN: 33 mg/dL — AB (ref 6–20)
CALCIUM: 8.9 mg/dL (ref 8.9–10.3)
CHLORIDE: 111 mmol/L (ref 101–111)
CO2: 19 mmol/L — AB (ref 22–32)
Creatinine, Ser: 0.74 mg/dL (ref 0.44–1.00)
GFR calc Af Amer: >60 mL/min (ref >60)
GFR calc non Af Amer: >60 mL/min (ref >60)
Glucose, Bld: 110 mg/dL — ABNORMAL HIGH (ref 65–99)
Potassium: 3.5 mmol/L (ref 3.5–5.1)
Sodium: 139 mmol/L (ref 135–145)

## 2015-04-30 LAB — HEMOGLOBIN A1C
HEMOGLOBIN A1C: 5.5 % (ref 4.8–5.6)
Mean Plasma Glucose: 111 mg/dL

## 2015-04-30 LAB — CBC
HCT: 29 % — ABNORMAL LOW (ref 36.0–46.0)
Hemoglobin: 9.2 g/dL — ABNORMAL LOW (ref 12.0–15.0)
MCH: 28.6 pg (ref 26.0–34.0)
MCHC: 31.7 g/dL (ref 30.0–36.0)
MCV: 90.1 fL (ref 78.0–100.0)
Platelets: 355 10*3/uL (ref 150–400)
RBC: 3.22 MIL/uL — ABNORMAL LOW (ref 3.87–5.11)
RDW: 15.9 % — AB (ref 11.5–15.5)
WBC: 9.3 10*3/uL (ref 4.0–10.5)

## 2015-04-30 LAB — BLOOD GAS, ARTERIAL
ACID-BASE DEFICIT: 4 mmol/L — AB (ref 0.0–2.0)
BICARBONATE: 20.5 meq/L (ref 20.0–24.0)
Drawn by: 129711
FIO2: 0.21 %
O2 Saturation: 96.1 %
Patient temperature: 98.6
TCO2: 21.6 mmol/L (ref 0–100)
pCO2 arterial: 37 mmHg (ref 35.0–45.0)
pH, Arterial: 7.361 (ref 7.350–7.450)
pO2, Arterial: 76.6 mmHg — ABNORMAL LOW (ref 80.0–100.0)

## 2015-04-30 LAB — GLUCOSE, CAPILLARY
GLUCOSE-CAPILLARY: 111 mg/dL — AB (ref 65–99)
GLUCOSE-CAPILLARY: 138 mg/dL — AB (ref 65–99)
Glucose-Capillary: 89 mg/dL (ref 65–99)
Glucose-Capillary: 106 mg/dL — ABNORMAL HIGH (ref 65–99)

## 2015-04-30 LAB — CULTURE, BLOOD (ROUTINE X 2)

## 2015-04-30 NOTE — Clinical Documentation Improvement (Signed)
Pt admitted with sepsis from UTI.  Pt is known to have chronic foley catheter related to sacral DU.   Would you please help clarify if UTI is related to chronic foley catheter?  Yes No Unable to determine Other clinical explanation   Thank you, Harrie Jeans ,RN Clinical Documentation Specialist:    [facility]- Health Information Management [phone] Cell - [phone]

## 2015-04-30 NOTE — Clinical Social Work Note (Signed)
CSW placed a follow up call to the pt's granddaughter Gabriel Rung. CSW left a voice message regarding the bed offers from the extended SNF search. The pt has 3 bed offers [facility], [facility], and [facility]. CSW will continue to follow the pt.   Kiriana Worthington, MSW, LCSWA [phone]

## 2015-04-30 NOTE — Care Management Note (Signed)
Case Management Note  Patient Details  Name: Susan Calhoun MRN: [id number] Date of Birth: 08/25/1928  Subjective/Objective:    Pt admitted from [facility] for sepsis.           Action/Plan: CSW assisting with disposition needs. CM will continue to monitor.   Expected Discharge Date:                  Expected Discharge Plan:  Skilled Nursing Facility  In-House Referral:  Clinical Social Work  Discharge planning Services  CM Consult  Post Acute Care Choice:    Choice offered to:     DME Arranged:    DME Agency:     HH Arranged:    HH Agency:     Status of Service:  In process, will continue to follow  Medicare Important Message Given:  Yes Date Medicare IM Given:  04/30/15 Medicare IM give by:  Tomi Bamberger, RN,BSN Date Additional Medicare IM Given:    Additional Medicare Important Message give by:     If discussed at Long Length of Stay Meetings, dates discussed:    Additional Comments:  Gala Lewandowsky, RN 04/30/2015, 2:27 PM

## 2015-04-30 NOTE — Progress Notes (Signed)
ANTIBIOTIC CONSULT NOTE - FOLLOW UP  Pharmacy Consult for Vancomycin, Zosyn  Indication: rule out sepsis  Allergies  Allergen Reactions  . Septra [Sulfamethoxazole-Trimethoprim] Hives    Patient Measurements: Height:  (149.9 cm) Weight: 132 lb (59.875 kg) IBW/kg (Calculated) : 43.2 Adjusted Body Weight:   Vital Signs: Temp: 97.3 F (36.3 C) (06/09 1153) Temp Source: Oral (06/09 1153) BP: 136/44 mmHg (06/09 1147) Pulse Rate: 65 (06/09 1147) Intake/Output from previous day: 06/08 0701 - 06/09 0700 In: 1994 [P.O.:240; I.V.:897; NG/GT:607; IV Piggyback:250] Out: 1100 [Urine:1100] Intake/Output from this shift:    Labs:  Recent Labs  04/28/15 0344 04/29/15 0533 04/30/15 0404  WBC 11.6* 9.8 9.3  HGB 9.8* 9.0* 9.2*  PLT 330 358 355  CREATININE 0.92 0.75 0.74   Estimated Creatinine Clearance: 40.5 mL/min (by C-G formula based on Cr of 0.74). No results for input(s): VANCOTROUGH, VANCOPEAK, VANCORANDOM, GENTTROUGH, GENTPEAK, GENTRANDOM, TOBRATROUGH, TOBRAPEAK, TOBRARND, AMIKACINPEAK, AMIKACINTROU, AMIKACIN in the last 72 hours.   Microbiology: Recent Results (from the past 720 hour(s))  Blood Culture (routine x 2)     Status: None (Preliminary result)   Collection Time: 04/27/15  8:02 PM  Result Value Ref Range Status   Specimen Description BLOOD RIGHT UPPER ARM  Final   Special Requests BOTTLES DRAWN AEROBIC ONLY 4CC  Final   Culture   Final           BLOOD CULTURE RECEIVED NO GROWTH TO DATE CULTURE WILL BE HELD FOR 5 DAYS BEFORE ISSUING A FINAL NEGATIVE REPORT Performed at [facility]    Report Status PENDING  Incomplete  Blood Culture (routine x 2)     Status: None   Collection Time: 04/27/15  8:11 PM  Result Value Ref Range Status   Specimen Description BLOOD RIGHT HAND  Final   Special Requests BOTTLES DRAWN AEROBIC AND ANAEROBIC 5CC EA  Final   Culture   Final    STAPHYLOCOCCUS SPECIES (COAGULASE NEGATIVE) Note: THE SIGNIFICANCE OF ISOLATING  THIS ORGANISM FROM A SINGLE SET OF BLOOD CULTURES WHEN MULTIPLE SETS ARE DRAWN IS UNCERTAIN. PLEASE NOTIFY THE MICROBIOLOGY DEPARTMENT WITHIN ONE WEEK IF SPECIATION AND SENSITIVITIES ARE REQUIRED. Note: Gram Stain Report Called to,Read Back By and Verified With: CHRIS RALPH ON 6.7.2016 AT 9:22P BY WILEJ Performed at [facility]    Report Status 04/30/2015 FINAL  Final  Urine culture     Status: None (Preliminary result)   Collection Time: 04/27/15  8:30 PM  Result Value Ref Range Status   Specimen Description URINE, RANDOM  Final   Special Requests NONE  Final   Colony Count   Final    >=100,000 COLONIES/ML Performed at [facility]    Culture   Final    GRAM NEGATIVE RODS ESCHERICHIA COLI Performed at [facility]    Report Status PENDING  Incomplete  MRSA PCR Screening     Status: None   Collection Time: 04/28/15  4:22 PM  Result Value Ref Range Status   MRSA by PCR NEGATIVE NEGATIVE Final    Comment:        The GeneXpert MRSA Assay (FDA approved for NASAL specimens only), is one component of a comprehensive MRSA colonization surveillance program. It is not intended to diagnose MRSA infection nor to guide or monitor treatment for MRSA infections.   Clostridium Difficile by PCR (not at [facility])     Status: Abnormal   Collection Time: 04/29/15  5:19 AM  Result Value Ref Range Status  C difficile by pcr POSITIVE (A) NEGATIVE Final    Comment: CRITICAL RESULT CALLED TO, READ BACK BY AND VERIFIED WITH: [facility] RN @ [phone] 04/29/15 LEONARD,A     Medical History: Past Medical History  Diagnosis Date  . A-fib   . Hypertension   . Glaucoma     right eye   . Carotid artery stenosis   . PVD (peripheral vascular disease)   . Hypercholesteremia   . Coronary artery disease   . DVT (deep venous thrombosis) 03/2015  . Type II diabetes mellitus   . History of blood transfusion     "related to OR"   . Stroke 08/2014    "hemorrhagic; some short term  memory loss; weak since" (04/28/2015)  . Arthritis     "all over"   . Hepatitis 1960's?    "don't know what kind; got it in the hospital"  . Gout   . Renal disorder   . Chronic kidney disease (CKD), stage II (mild)   . Chronic anemia     Medications:  Prescriptions prior to admission  Medication Sig Dispense Refill Last Dose  . allopurinol (ZYLOPRIM) 100 MG tablet Take 100 mg by mouth 2 (two) times daily.    04/26/2015 at Unknown time  . amantadine (SYMMETREL) 50 MG/5ML solution Place 10 mLs (100 mg total) into feeding tube 2 (two) times daily. 473 mL 0 04/27/2015 at Unknown time  . Amino Acids-Protein Hydrolys (FEEDING SUPPLEMENT, PRO-STAT SUGAR FREE 64,) LIQD Take 30 mLs by mouth 3 (three) times daily with meals.   04/26/2015 at Unknown time  . amiodarone (PACERONE) 100 MG tablet Place 1 tablet (100 mg total) into feeding tube daily.   04/26/2015 at Unknown time  . aspirin 325 MG tablet Place 1 tablet (325 mg total) into feeding tube daily. 30 tablet 0 04/27/2015 at Unknown time  . cefTRIAXone (ROCEPHIN) 1 G injection Inject 1 g into the muscle daily. For 3 days only. Started on 04-26-15   04/27/2015 at Unknown time  . Cholecalciferol (VITAMIN D3) 2000 UNITS TABS Give 2,000 Units by tube daily.    04/27/2015 at Unknown time  . COLLAGEN MATRIX-SILVER EX Apply 1 application topically daily.   04/26/2015 at Unknown time  . Cranberry 450 MG TABS Take 450 mg by mouth 2 (two) times daily.   04/27/2015 at Unknown time  . Cyanocobalamin (VITAMIN B-12 PO) Give 1 tablet by tube daily.    04/27/2015 at Unknown time  . famotidine (PEPCID) 40 MG/5ML suspension Place 2.5 mLs (20 mg total) into feeding tube daily. 50 mL 0 04/27/2015 at Unknown time  . ferrous sulfate 220 (44 FE) MG/5ML solution Place 325 mg into feeding tube daily. 7.2ml's   04/27/2015 at Unknown time  . furosemide (LASIX) 20 MG tablet Give 20 mg by tube daily.    04/27/2015 at Unknown time  . insulin aspart (NOVOLOG) 100 UNIT/ML injection Inject 0-5 Units  into the skin at bedtime. 10 mL 11 04/26/2015 at Unknown time  . insulin aspart (NOVOLOG) 100 UNIT/ML injection Inject 0-9 Units into the skin 3 (three) times daily with meals. 10 mL 11 04/26/2015 at Unknown time  . lisinopril (PRINIVIL,ZESTRIL) 5 MG tablet Take 1 tablet (5 mg total) by mouth daily. 30 tablet 0 04/27/2015 at Unknown time  . metoCLOPramide (REGLAN) 5 MG/5ML solution Place 5 mg into feeding tube 4 (four) times daily -  before meals and at bedtime.   04/27/2015 at Unknown time  . metoprolol tartrate (LOPRESSOR) 25 MG tablet  Place 1 tablet (25 mg total) into feeding tube 3 (three) times daily. 90 tablet 3 04/26/2015 at Unknown time  . morphine (ROXANOL) 20 MG/ML concentrated solution Place 5 mg into feeding tube daily. Prior to wound care   04/27/2015 at Unknown time  . Multiple Vitamins-Minerals (DECUBI-VITE) CAPS Give 1 capsule by tube daily.    04/27/2015 at Unknown time  . Nutritional Supplements (FEEDING SUPPLEMENT, GLUCERNA 1.2 CAL,) LIQD Place 237 mLs into feeding tube 4 (four) times daily.    04/27/2015 at Unknown time  . oxyCODONE (OXY IR/ROXICODONE) 5 MG immediate release tablet Take 1 tablet (5 mg total) by mouth every 4 (four) hours as needed for moderate pain. 15 tablet 0 Past Week at Unknown time  . pregabalin (LYRICA) 25 MG capsule Give 25 mg by tube daily.    04/27/2015 at Unknown time  . Travoprost, BAK Free, (TRAVATAN) 0.004 % SOLN ophthalmic solution Place 1 drop into the right eye at bedtime.   04/26/2015 at Unknown time  . vitamin C (ASCORBIC ACID) 500 MG tablet Give 500 mg by tube daily.   04/27/2015 at Unknown time  . Wound Dressings (MEDIHONEY WOUND/BURN DRESSING) GEL Apply 1 application topically daily.   Past Week at Unknown time  . Zinc 30 MG TABS Give 60 mg by tube daily.   04/27/2015 at Unknown time  . feeding supplement, ENSURE COMPLETE, (ENSURE COMPLETE) LIQD Take 237 mLs by mouth 3 (three) times daily with meals. (Patient not taking: Reported on 11/24/2014) 15 Bottle 0 Not Taking at  Unknown time  . Nutritional Supplements (FEEDING SUPPLEMENT, VITAL AF 1.2 CAL,) LIQD Place 270 mLs into feeding tube 4 (four) times daily - after meals and at bedtime. (Patient not taking: Reported on 11/24/2014) 1000 mL prn Not Taking at Unknown time   Scheduled:  . allopurinol  100 mg Per Tube BID  . amantadine  100 mg Per Tube BID  . amiodarone  100 mg Per Tube Daily  . aspirin  325 mg Per Tube Daily  . cholecalciferol  2,000 Units Oral Daily  . enoxaparin (LOVENOX) injection  40 mg Subcutaneous Q24H  . famotidine  20 mg Per Tube Daily  . feeding supplement (GLUCERNA 1.2 CAL)  237 mL Per Tube QID  . feeding supplement (PRO-STAT SUGAR FREE 64)  30 mL Per Tube TID WC  . ferrous sulfate  325 mg Per Tube Daily  . insulin aspart  0-9 Units Subcutaneous TID WC  . latanoprost  1 drop Right Eye QHS  . metoCLOPramide  5 mg Per Tube TID AC & HS  . metoprolol tartrate  25 mg Per Tube TID  . metroNIDAZOLE  500 mg Per Tube 3 times per day  . morphine CONCENTRATE  5 mg Per Tube Daily  . multivitamin-lutein  1 capsule Per Tube Daily  . piperacillin-tazobactam (ZOSYN)  IV  3.375 g Intravenous Q8H  . vancomycin  750 mg Intravenous Q24H  . zinc sulfate  220 mg Per Tube Daily   Infusions:  . sodium chloride 60 mL/hr at 04/29/15 1758   Assessment: 81yo female presents with LOC since Saturday. Pharmacy is currently dosing Vancomycin and Zosyn as empiric therapy. UCx is now growing >100K E.coli. 1/2 Bcx grew coag neg stap which is likely contaminant. WBC wnl. Patient remains afebrile. CrCl ~ 40 mL/min   Goal of Therapy:  Vancomycin trough level 15-20 mcg/ml  Plan:  -Vancomycin 1g IV once followed by 750mg  q24h. Would recommend stopping Vanc since source of infection seems  to urine.  -Zosyn 3.375 gm IV Q 8 hours. Recommend de-escalation as susceptibilities result.  Measure antibiotic drug levels at steady state Follow up culture results, renal function, and clinical course  Vinnie Level,  PharmD., BCPS Clinical Pharmacist Pager [phone]

## 2015-04-30 NOTE — Progress Notes (Addendum)
[place] TEAM 1 - Stepdown/ICU TEAM Progress Note  Maily Debarge ZOX:[id number] DOB: 1928-08-21 DOA: 04/27/2015 PCP: Georgann Housekeeper, MD  Admit HPI / Brief Narrative: 81 y.o. F w/ a Hx Atrial fibrillation and DVT not on anticoagulation secondary to hemorrhagic stroke October 2015, diabetes mellitus type 2, dysphagia with PEG tube, and Stage IV sacral decubitus ulcer who was brought to the ER after found to have increasing confusion and lethargy.  The patient had been treated recently for UTI with ceftriaxone.    In the ER patient was found to be hypotensive and in A. fib with RVR. Patient's blood pressure and heart rate improved with fluid boluses. UA was consistent with UTI. CT head was negative for acute findings.   HPI/Subjective: Patient was apparently more alert yesterday according to the notes, today she is nonverbal, somnolent Not able to answer any questions, no family by the bedside.  Assessment/Plan:  Sepsis secondary to UTI -Continue empiric antibiotics - urine culture showing gram-negative rods, sensitivity pending,  Continue Zosyn  C diff colitis  -Flagyl initiated - course will need to be extended for 14 days post-discontinuation of other broad-spectrum antibiotics - flexiseal  placed to protect pre-existing sacral wound from fecal contamination  1/2 blood cx + for gram+ cocci in clusters Suspicious for contaminant - cont Vanc until speciation available   Acute encephalopathy -appears to be TME due to UTI, C diff, sedating medications, and volume depletion -CT head is unremarkable Patient appears to be somnolent and sedated, check ABG  Chronic A. fib with acute RVR  -improved after volume resuscitation  -not on anticoagulate secondary to intracranial hemorrhage  Hypokalemia -Corrected and follow  Hypertension  -currently well controlled off medical tx due to volume depletion   Chronic sacral decubitus ulcers and heel ulcers  -wound consult requested - patient  is followed at [facility] wound care center  Chronic anemia  -follow Hgb trend  -Anemia panel suggests anemia of chronic disease  Diabetes mellitus type 2  -CBG currently well-controlled -Hemoglobin A1c pending -Lipid panel pending  Code Status: NO CODE - DNR  Family Communication: Spoke with family at bedside at length Disposition Plan: cont current tx course - eventual return to SNF (?Sat or Monday)  Consultants: none  Procedures: none  Antibiotics: Zosyn 6/6 > Vancomycin 6/6 > Flagyl 6/8 >  DVT prophylaxis: Lovenox  Objective: Blood pressure 137/54, pulse 66, temperature 98 F (36.7 C), temperature source Oral, resp. rate 27, height  (1.499 m), weight 59.875 kg (132 lb), SpO2 100 %.  Intake/Output Summary (Last 24 hours) at 04/30/15 1138 Last data filed at 04/30/15 0843  Gross per 24 hour  Intake   1974 ml  Output   1100 ml  Net    874 ml   Exam: General: Somnolent, not responding to questions Lungs: Clear to auscultation bilaterally without wheezes or crackles Cardiovascular: Regular rate without murmur gallop or rub  Abdomen: Nontender, nondistended, soft, bowel sounds positive, no rebound, no ascites, no appreciable mass Extremities: No significant cyanosis, clubbing, or edema bilateral lower extremities - soft heel boots in place B LE  Neurologic unable to assess  Data Reviewed: Basic Metabolic Panel:  Recent Labs Lab 04/27/15 2011 04/28/15 0344 04/29/15 0533 04/30/15 0404  NA 131* 135 138 139  K 3.9 3.8 3.2* 3.5  CL 97* 102 107 111  CO2 19*  GLUCOSE 103* 88 106* 110*  BUN 42* 34* 33* 33*  CREATININE 0.95 0.92 0.75 0.74  CALCIUM 8.9 8.8*  8.9 8.9  MG  --   --  1.7  --    Liver Function Tests:  Recent Labs Lab 04/27/15 2011 04/28/15 0344 04/29/15 0533  AST 15 17 18   ALT 11* 11* 13*  ALKPHOS 71 68 67  BILITOT 0.5 0.4 0.3  PROT 5.7* 5.9* 5.6*  ALBUMIN 2.2* 2.3* 2.0*   CBC:  Recent Labs Lab 04/27/15 2011  04/28/15 0344 04/29/15 0533 04/30/15 0404  WBC 14.2* 11.6* 9.8 9.3  NEUTROABS 10.3* 8.2* 6.8  --   HGB 8.5* 9.8* 9.0* 9.2*  HCT 26.6* 31.6* 28.9* 29.0*  MCV 90.5 90.3 90.6 90.1  PLT 350 330 358 355   Cardiac Enzymes:  Recent Labs Lab 04/28/15 0344  TROPONINI 0.06*   ProBNP (last 3 results)  Recent Labs  09/16/14 1730  PROBNP 9594.0*    CBG:  Recent Labs Lab 04/29/15 0751 04/29/15 1141 04/29/15 1702 04/29/15 2056 04/30/15 0755  GLUCAP 89 111* 131* 119* 89    Recent Results (from the past 240 hour(s))  Blood Culture (routine x 2)     Status: None (Preliminary result)   Collection Time: 04/27/15  8:02 PM  Result Value Ref Range Status   Specimen Description BLOOD RIGHT UPPER ARM  Final   Special Requests BOTTLES DRAWN AEROBIC ONLY 4CC  Final   Culture   Final           BLOOD CULTURE RECEIVED NO GROWTH TO DATE CULTURE WILL BE HELD FOR 5 DAYS BEFORE ISSUING A FINAL NEGATIVE REPORT Performed at [facility]    Report Status PENDING  Incomplete  Blood Culture (routine x 2)     Status: None   Collection Time: 04/27/15  8:11 PM  Result Value Ref Range Status   Specimen Description BLOOD RIGHT HAND  Final   Special Requests BOTTLES DRAWN AEROBIC AND ANAEROBIC 5CC EA  Final   Culture   Final    STAPHYLOCOCCUS SPECIES (COAGULASE NEGATIVE) Note: THE SIGNIFICANCE OF ISOLATING THIS ORGANISM FROM A SINGLE SET OF BLOOD CULTURES WHEN MULTIPLE SETS ARE DRAWN IS UNCERTAIN. PLEASE NOTIFY THE MICROBIOLOGY DEPARTMENT WITHIN ONE WEEK IF SPECIATION AND SENSITIVITIES ARE REQUIRED. Note: Gram Stain Report Called to,Read Back By and Verified With: CHRIS RALPH ON 6.7.2016 AT 9:22P BY WILEJ Performed at [facility]    Report Status 04/30/2015 FINAL  Final  Urine culture     Status: None (Preliminary result)   Collection Time: 04/27/15  8:30 PM  Result Value Ref Range Status   Specimen Description URINE, RANDOM  Final   Special Requests NONE  Final   Colony Count    Final    >=100,000 COLONIES/ML Performed at [facility]    Culture   Final    GRAM NEGATIVE RODS ESCHERICHIA COLI Performed at [facility]    Report Status PENDING  Incomplete  MRSA PCR Screening     Status: None   Collection Time: 04/28/15  4:22 PM  Result Value Ref Range Status   MRSA by PCR NEGATIVE NEGATIVE Final    Comment:        The GeneXpert MRSA Assay (FDA approved for NASAL specimens only), is one component of a comprehensive MRSA colonization surveillance program. It is not intended to diagnose MRSA infection nor to guide or monitor treatment for MRSA infections.   Clostridium Difficile by PCR (not at [facility])     Status: Abnormal   Collection Time: 04/29/15  5:19 AM  Result Value Ref Range Status  C difficile by pcr POSITIVE (A) NEGATIVE Final    Comment: CRITICAL RESULT CALLED TO, READ BACK BY AND VERIFIED WITH: HARRIS,LATAZIA RN @ [phone] 04/29/15 LEONARD,A      Studies:  Recent x-ray studies have been reviewed in detail by the Attending Physician  Scheduled Meds:  Scheduled Meds: . allopurinol  100 mg Per Tube BID  . amantadine  100 mg Per Tube BID  . amiodarone  100 mg Per Tube Daily  . aspirin  325 mg Per Tube Daily  . cholecalciferol  2,000 Units Oral Daily  . enoxaparin (LOVENOX) injection  40 mg Subcutaneous Q24H  . famotidine  20 mg Per Tube Daily  . feeding supplement (GLUCERNA 1.2 CAL)  237 mL Per Tube QID  . feeding supplement (PRO-STAT SUGAR FREE 64)  30 mL Per Tube TID WC  . ferrous sulfate  325 mg Per Tube Daily  . insulin aspart  0-9 Units Subcutaneous TID WC  . latanoprost  1 drop Right Eye QHS  . metoCLOPramide  5 mg Per Tube TID AC & HS  . metoprolol tartrate  25 mg Per Tube TID  . metroNIDAZOLE  500 mg Per Tube 3 times per day  . morphine CONCENTRATE  5 mg Per Tube Daily  . multivitamin-lutein  1 capsule Per Tube Daily  . piperacillin-tazobactam (ZOSYN)  IV  3.375 g Intravenous Q8H  . vancomycin  750 mg Intravenous  Q24H  . zinc sulfate  220 mg Per Tube Daily    Time spent on care of this patient: 35 mins  Richarda Overlie, MD [facility] For Consults/Admissions - Flow Manager - [phone] Office  [phone]  Contact MD directly via text page:      [email or web address]      password [facility]  04/30/2015, 11:38 AM   LOS: 3 days

## 2015-05-01 DIAGNOSIS — I4891 Unspecified atrial fibrillation: Secondary | ICD-10-CM

## 2015-05-01 LAB — COMPREHENSIVE METABOLIC PANEL
ALBUMIN: 2 g/dL — AB (ref 3.5–5.0)
ALT: 11 U/L — AB (ref 14–54)
AST: 16 U/L (ref 15–41)
Alkaline Phosphatase: 83 U/L (ref 38–126)
Anion gap: 9 (ref 5–15)
BUN: 29 mg/dL — ABNORMAL HIGH (ref 6–20)
CO2: 22 mmol/L (ref 22–32)
Calcium: 9.4 mg/dL (ref 8.9–10.3)
Chloride: 110 mmol/L (ref 101–111)
Creatinine, Ser: 0.7 mg/dL (ref 0.44–1.00)
GFR calc Af Amer: >60 mL/min (ref >60)
GFR calc non Af Amer: >60 mL/min (ref >60)
Glucose, Bld: 130 mg/dL — ABNORMAL HIGH (ref 65–99)
Potassium: 3.6 mmol/L (ref 3.5–5.1)
Sodium: 141 mmol/L (ref 135–145)
TOTAL PROTEIN: 6 g/dL — AB (ref 6.5–8.1)
Total Bilirubin: 0.3 mg/dL (ref 0.3–1.2)

## 2015-05-01 LAB — URINE CULTURE

## 2015-05-01 LAB — GLUCOSE, CAPILLARY
GLUCOSE-CAPILLARY: 91 mg/dL (ref 65–99)
GLUCOSE-CAPILLARY: 140 mg/dL — AB (ref 65–99)
Glucose-Capillary: 126 mg/dL — ABNORMAL HIGH (ref 65–99)
Glucose-Capillary: 112 mg/dL — ABNORMAL HIGH (ref 65–99)

## 2015-05-01 MED ORDER — CIPROFLOXACIN IN D5W 400 MG/200ML IV SOLN
400.0000 mg | Freq: Two times a day (BID) | INTRAVENOUS | Status: DC
  Administered 2015-05-01 – 2015-05-04 (×6): 400 mg via INTRAVENOUS
  Filled 2015-05-01 (×7): qty 200

## 2015-05-01 NOTE — Progress Notes (Addendum)
[place] TEAM 1 - Stepdown/ICU TEAM Progress Note  Susan Calhoun WUJ:[id number] DOB: 1928-05-16 DOA: 04/27/2015 PCP: Georgann Housekeeper, MD  Admit HPI / Brief Narrative: 81 y.o. F w/ a Hx Atrial fibrillation and DVT not on anticoagulation secondary to hemorrhagic stroke October 2015, diabetes mellitus type 2, dysphagia with PEG tube, and Stage IV sacral decubitus ulcer who was brought to the ER after found to have increasing confusion and lethargy.  The patient had been treated recently for UTI with ceftriaxone.    In the ER patient was found to be hypotensive and in A. fib with RVR. Patient's blood pressure and heart rate improved with fluid boluses. UA was consistent with UTI. CT head was negative for acute findings.   HPI/Subjective: Patient is very slow to respond, but arousable, still having a lot of diarrhea, she can also answer only with head nodding, denies being uncomfortable   Assessment/Plan:  Sepsis secondary to UTI -Continue empiric antibiotics - urine culture showing gram-negative rods, based on sensitivity the patient's antibiotic has been switched from Zosyn to ciprofloxacin    C diff colitis  Continue Flagyl- course will need to be extended for 14 days post-discontinuation of other broad-spectrum antibiotics - flexiseal to be continued as the patient still has a lot of diarrhea    1/2 blood cx + for gram+ cocci in clusters Suspicious for contaminant -coag negative staph, vancomycin discontinued  Acute encephalopathy -appears to be TME due to UTI, C diff, sedating medications, and volume depletion -CT head is unremarkable Patient appears to be somnolent and sedated, ABG within normal limits No significant improvement despite all of the above Will request palliative care consultation to discuss goals of care  Chronic A. fib with acute RVR  -improved after volume resuscitation  -not on anticoagulate secondary to intracranial hemorrhage  Hypokalemia -Corrected and  follow  Hypertension  -currently well controlled off medical tx due to volume depletion   Chronic sacral decubitus ulcers and heel ulcers  -wound consult requested - patient is followed at [facility] wound care center  Chronic anemia  -follow Hgb trend  -Anemia panel suggests anemia of chronic disease  Diabetes mellitus type 2  -CBG currently well-controlled -Hemoglobin A1c pending -Lipid panel pending  Code Status: NO CODE - DNR  Family Communication: Spoke with family at bedside at length Disposition Plan: Possible discharge to SNF on Saturday or Monday, palliative care consultation requested for goals of care, hospice eligibility  Consultants: none  Procedures: none  Antibiotics: Anti-infectives    Start     Dose/Rate Route Frequency Ordered Stop   05/01/15 1100  ciprofloxacin (CIPRO) IVPB 400 mg     400 mg 200 mL/hr over 60 Minutes Intravenous Every 12 hours 05/01/15 1023     04/29/15 1400  metroNIDAZOLE (FLAGYL) tablet 500 mg  Status:  Discontinued     500 mg Oral 3 times per day 04/29/15 1203 04/29/15 1337   04/29/15 1400  metroNIDAZOLE (FLAGYL) 50 mg/ml oral suspension 500 mg     500 mg Per Tube 3 times per day 04/29/15 1337 05/13/15 1359   04/28/15 1930  vancomycin (VANCOCIN) IVPB 750 mg/150 ml premix  Status:  Discontinued     750 mg 150 mL/hr over 60 Minutes Intravenous Every 24 hours 04/27/15 2226 04/30/15 1334   04/28/15 0400  piperacillin-tazobactam (ZOSYN) IVPB 3.375 g  Status:  Discontinued     3.375 g 12.5 mL/hr over 240 Minutes Intravenous Every 8 hours 04/28/15 0321 05/01/15 1023   04/27/15 1930  vancomycin (VANCOCIN) IVPB 1000 mg/200 mL premix     1,000 mg 200 mL/hr over 60 Minutes Intravenous  Once 04/27/15 1929 04/27/15 2229   04/27/15 1915  piperacillin-tazobactam (ZOSYN) IVPB 3.375 g     3.375 g 100 mL/hr over 30 Minutes Intravenous  Once 04/27/15 1908 04/27/15 2049       DVT prophylaxis: Lovenox  Objective: Blood pressure 143/81, pulse  110, temperature 98.4 F (36.9 C), temperature source Oral, resp. rate 23, height  (1.499 m), weight 57.153 kg (126 lb), SpO2 96 %.  Intake/Output Summary (Last 24 hours) at 05/01/15 1031 Last data filed at 05/01/15 0544  Gross per 24 hour  Intake    777 ml  Output   2025 ml  Net  -1248 ml   Exam: General: Somnolent, not responding to questions Lungs: Clear to auscultation bilaterally without wheezes or crackles Cardiovascular: Regular rate without murmur gallop or rub  Abdomen: Nontender, nondistended, soft, bowel sounds positive, no rebound, no ascites, no appreciable mass Extremities: No significant cyanosis, clubbing, or edema bilateral lower extremities - soft heel boots in place B LE  Neurologic unable to assess  Data Reviewed: Basic Metabolic Panel:  Recent Labs Lab 04/27/15 2011 04/28/15 0344 04/29/15 0533 04/30/15 0404 05/01/15 0256  NA 131* 135 138 139 141  K 3.9 3.8 3.2* 3.5 3.6  CL 97* 102 107 111 110  CO2 19* 22  GLUCOSE 103* 88 106* 110* 130*  BUN 42* 34* 33* 33* 29*  CREATININE 0.95 0.92 0.75 0.74 0.70  CALCIUM 8.9 8.8* 8.9 8.9 9.4  MG  --   --  1.7  --   --    Liver Function Tests:  Recent Labs Lab 04/27/15 2011 04/28/15 0344 04/29/15 0533 05/01/15 0256  AST ALT 11* 11* 13* 11*  ALKPHOS 71 68 67 83  BILITOT 0.5 0.4 0.3 0.3  PROT 5.7* 5.9* 5.6* 6.0*  ALBUMIN 2.2* 2.3* 2.0* 2.0*   CBC:  Recent Labs Lab 04/27/15 2011 04/28/15 0344 04/29/15 0533 04/30/15 0404  WBC 14.2* 11.6* 9.8 9.3  NEUTROABS 10.3* 8.2* 6.8  --   HGB 8.5* 9.8* 9.0* 9.2*  HCT 26.6* 31.6* 28.9* 29.0*  MCV 90.5 90.3 90.6 90.1  PLT 350 330 358 355   Cardiac Enzymes:  Recent Labs Lab 04/28/15 0344  TROPONINI 0.06*   ProBNP (last 3 results)  Recent Labs  09/16/14 1730  PROBNP 9594.0*    CBG:  Recent Labs Lab 04/30/15 0755 04/30/15 1152 04/30/15 1629 04/30/15 2204 05/01/15 0749  GLUCAP 89 106* 111* 138* 126*    Recent  Results (from the past 240 hour(s))  Blood Culture (routine x 2)     Status: None (Preliminary result)   Collection Time: 04/27/15  8:02 PM  Result Value Ref Range Status   Specimen Description BLOOD RIGHT UPPER ARM  Final   Special Requests BOTTLES DRAWN AEROBIC ONLY 4CC  Final   Culture   Final           BLOOD CULTURE RECEIVED NO GROWTH TO DATE CULTURE WILL BE HELD FOR 5 DAYS BEFORE ISSUING A FINAL NEGATIVE REPORT Performed at [facility]    Report Status PENDING  Incomplete  Blood Culture (routine x 2)     Status: None   Collection Time: 04/27/15  8:11 PM  Result Value Ref Range Status   Specimen Description BLOOD RIGHT HAND  Final   Special Requests BOTTLES DRAWN AEROBIC AND ANAEROBIC 5CC  EA  Final   Culture   Final    STAPHYLOCOCCUS SPECIES (COAGULASE NEGATIVE) Note: THE SIGNIFICANCE OF ISOLATING THIS ORGANISM FROM A SINGLE SET OF BLOOD CULTURES WHEN MULTIPLE SETS ARE DRAWN IS UNCERTAIN. PLEASE NOTIFY THE MICROBIOLOGY DEPARTMENT WITHIN ONE WEEK IF SPECIATION AND SENSITIVITIES ARE REQUIRED. Note: Gram Stain Report Called to,Read Back By and Verified With: CHRIS RALPH ON 6.7.2016 AT 9:22P BY WILEJ Performed at [facility]    Report Status 04/30/2015 FINAL  Final  Urine culture     Status: None   Collection Time: 04/27/15  8:30 PM  Result Value Ref Range Status   Specimen Description URINE, RANDOM  Final   Special Requests NONE  Final   Colony Count   Final    >=100,000 COLONIES/ML Performed at [facility]    Culture   Final    KLEBSIELLA PNEUMONIAE ESCHERICHIA COLI Performed at [facility]    Report Status 05/01/2015 FINAL  Final   Organism ID, Bacteria KLEBSIELLA PNEUMONIAE  Final   Organism ID, Bacteria ESCHERICHIA COLI  Final      Susceptibility   Escherichia coli - MIC*    AMPICILLIN >=32 RESISTANT Resistant     CEFAZOLIN <=4 SENSITIVE Sensitive     CEFTRIAXONE <=1 SENSITIVE Sensitive     CIPROFLOXACIN 1 SENSITIVE Sensitive      GENTAMICIN <=1 SENSITIVE Sensitive     LEVOFLOXACIN 1 SENSITIVE Sensitive     NITROFURANTOIN <=16 SENSITIVE Sensitive     TOBRAMYCIN <=1 SENSITIVE Sensitive     TRIMETH/SULFA <=20 SENSITIVE Sensitive     PIP/TAZO <=4 SENSITIVE Sensitive     * ESCHERICHIA COLI   Klebsiella pneumoniae - MIC*    AMPICILLIN >=32 RESISTANT Resistant     CEFAZOLIN >=64 RESISTANT Resistant     CEFTRIAXONE 32 INTERMEDIATE Intermediate     CIPROFLOXACIN <=0.25 SENSITIVE Sensitive     GENTAMICIN >=16 RESISTANT Resistant     LEVOFLOXACIN <=0.12 SENSITIVE Sensitive     NITROFURANTOIN 64 INTERMEDIATE Intermediate     TOBRAMYCIN 2 SENSITIVE Sensitive     TRIMETH/SULFA <=20 SENSITIVE Sensitive     PIP/TAZO 64 INTERMEDIATE Intermediate     * KLEBSIELLA PNEUMONIAE  MRSA PCR Screening     Status: None   Collection Time: 04/28/15  4:22 PM  Result Value Ref Range Status   MRSA by PCR NEGATIVE NEGATIVE Final    Comment:        The GeneXpert MRSA Assay (FDA approved for NASAL specimens only), is one component of a comprehensive MRSA colonization surveillance program. It is not intended to diagnose MRSA infection nor to guide or monitor treatment for MRSA infections.   Clostridium Difficile by PCR (not at [facility])     Status: Abnormal   Collection Time: 04/29/15  5:19 AM  Result Value Ref Range Status   C difficile by pcr POSITIVE (A) NEGATIVE Final    Comment: CRITICAL RESULT CALLED TO, READ BACK BY AND VERIFIED WITH: HARRIS,LATAZIA RN @ [phone] 04/29/15 LEONARD,A      Studies:  Recent x-ray studies have been reviewed in detail by the Attending Physician  Scheduled Meds:  Scheduled Meds: . allopurinol  100 mg Per Tube BID  . amantadine  100 mg Per Tube BID  . amiodarone  100 mg Per Tube Daily  . aspirin  325 mg Per Tube Daily  . cholecalciferol  2,000 Units Oral Daily  . ciprofloxacin  400 mg Intravenous Q12H  . enoxaparin (LOVENOX) injection  40 mg Subcutaneous  Q24H  . famotidine  20 mg Per Tube Daily  .  feeding supplement (GLUCERNA 1.2 CAL)  237 mL Per Tube QID  . feeding supplement (PRO-STAT SUGAR FREE 64)  30 mL Per Tube TID WC  . ferrous sulfate  325 mg Per Tube Daily  . insulin aspart  0-9 Units Subcutaneous TID WC  . latanoprost  1 drop Right Eye QHS  . metoCLOPramide  5 mg Per Tube TID AC & HS  . metoprolol tartrate  25 mg Per Tube TID  . metroNIDAZOLE  500 mg Per Tube 3 times per day  . morphine CONCENTRATE  5 mg Per Tube Daily  . multivitamin-lutein  1 capsule Per Tube Daily  . zinc sulfate  220 mg Per Tube Daily    Time spent on care of this patient: 35 mins  Richarda Overlie, MD [facility] For Consults/Admissions - Flow Manager - [phone] Office  [phone]  Contact MD directly via text page:      [email or web address]      password [facility]  05/01/2015, 10:31 AM   LOS: 4 days

## 2015-05-02 DIAGNOSIS — Z515 Encounter for palliative care: Secondary | ICD-10-CM

## 2015-05-02 LAB — GLUCOSE, CAPILLARY
GLUCOSE-CAPILLARY: 137 mg/dL — AB (ref 65–99)
Glucose-Capillary: 90 mg/dL (ref 65–99)
Glucose-Capillary: 96 mg/dL (ref 65–99)
Glucose-Capillary: 140 mg/dL — ABNORMAL HIGH (ref 65–99)

## 2015-05-02 MED ORDER — SODIUM CHLORIDE 0.9 % IV SOLN
Freq: Once | INTRAVENOUS | Status: AC
  Administered 2015-05-02: 600 mL via INTRAVENOUS

## 2015-05-02 NOTE — Progress Notes (Signed)
Dr. Susie Cassette updated with  Questionable v tach vs WQRS  Noted at 1544, 1545 hrs. Pt also at time had vomited. New order received to give NS at 100 cc/hr x 6 hours then KVO fluids.

## 2015-05-02 NOTE — Consult Note (Signed)
Consultation Note Date: 05/02/2015   Patient Name: Susan Calhoun  DOB: 06-Nov-1928  MRN: [id number]  Age / Sex: 81 y.o., female   PCP: Wenda Low, MD Referring Physician: Reyne Dumas, MD  Reason for Consultation: Establishing goals of care  Palliative Care Assessment and Plan Summary of Established Goals of Care and Medical Treatment Preferences   Clinical Assessment/Narrative: 81 yo female with PMHx of Afib, DVT, ICH (so no AntiCoag), dysphagia s/p PEG admitted with encephalopathy in setting of sepsis 2/2 UTI and c-diff  Contacts/Participants in Discussion: Primary Decision Maker:Daughter [name] HCPOA: not documented   Patient unable to provide any history today. Spoke briefly with daughter [name] and reviewed records. We met patient shortly after ICH in 09/2014. She was evaluated by one of our NPs at that time. She has had several hospitalizations since then (DVT's, bradycardia/UTI) and her stage IV sacral ulcer is concerning as well. While her acute issues resolving, it seems likely that she is high risk for setbacks and doing poorly in long run.  In speaking with daughter today, she requested to talk further in person tomorrow at 1PM and I will plan on meeting her then.   Code Status/Advance Care Planning:  DNR and OOH DNR documented  Symptom Management:   Acute Encephalopathy- avoid benzo's. Resolving.   Psycho-social/Spiritual:   Support System: will find out more tomorrow   Prognosis: Unable to determine  Discharge Planning:  SNF       Chief Complaint: AMS  HPI:  81 yo female with PMHx of Afib, DVT not on anticoag after ICH, dysphagia s/p PEG.  She was admitted on 6/7 with AMS and lethargy in context of recent UTI.  She also has Stage IV decub ulcer on admission.  Workup thus far has revealed sepsis 2/2 UTI as well as diff both contributing to toxic metabolic encephalopathy. Her encephalopathy has been resolving and she is transitioned off IV abx. Notably  CT of her head was negative for acute abnormality and her CXR was not consistent with PNA. She is unable to provide any meaningful history.  Notes indicate possible discharge back to SNF this weekend vs Monday. This is her 3rd admission this calendar year. She also had bilateral DVT and admission for bradycardia/UTI.   Primary Diagnoses  Present on Admission:  . Urinary tract infection . Acute encephalopathy . Atrial fibrillation with RVR . Chronic anemia . Sepsis . Decubitus ulcer of sacral region, stage 4  I have reviewed the medical record, interviewed the patient and family, and examined the patient. The following aspects are pertinent.  Past Medical History  Diagnosis Date  . A-fib   . Hypertension   . Glaucoma     right eye   . Carotid artery stenosis   . PVD (peripheral vascular disease)   . Hypercholesteremia   . Coronary artery disease   . DVT (deep venous thrombosis) 03/2015  . Type II diabetes mellitus   . History of blood transfusion     "related to OR"   . Stroke 08/2014    "hemorrhagic; some short term memory loss; weak since" (04/28/2015)  . Arthritis     "all over"   . Hepatitis 1960's?    "don't know what kind; got it in the hospital"  . Gout   . Renal disorder   . Chronic kidney disease (CKD), stage II (mild)   . Chronic anemia    History   Social History  . Marital Status: Widowed    Spouse Name: N/A  .  Number of Children: N/A  . Years of Education: N/A   Social History Main Topics  . Smoking status: Former Smoker -- 0.10 packs/day for 20 years    Types: Cigarettes  . Smokeless tobacco: Never Used     Comment: "smoked many many years ago"  . Alcohol Use: No  . Drug Use: No  . Sexual Activity: No   Other Topics Concern  . None   Social History Narrative   History reviewed. No pertinent family history. Scheduled Meds: . allopurinol  100 mg Per Tube BID  . amantadine  100 mg Per Tube BID  . amiodarone  100 mg Per Tube Daily  . aspirin  325  mg Per Tube Daily  . cholecalciferol  2,000 Units Oral Daily  . ciprofloxacin  400 mg Intravenous Q12H  . enoxaparin (LOVENOX) injection  40 mg Subcutaneous Q24H  . famotidine  20 mg Per Tube Daily  . feeding supplement (GLUCERNA 1.2 CAL)  237 mL Per Tube QID  . feeding supplement (PRO-STAT SUGAR FREE 64)  30 mL Per Tube TID WC  . ferrous sulfate  325 mg Per Tube Daily  . insulin aspart  0-9 Units Subcutaneous TID WC  . latanoprost  1 drop Right Eye QHS  . metoCLOPramide  5 mg Per Tube TID AC & HS  . metoprolol tartrate  25 mg Per Tube TID  . metroNIDAZOLE  500 mg Per Tube 3 times per day  . morphine CONCENTRATE  5 mg Per Tube Daily  . multivitamin-lutein  1 capsule Per Tube Daily  . zinc sulfate  220 mg Per Tube Daily   Continuous Infusions: . sodium chloride 60 mL/hr (05/01/15 2052)   PRN Meds:.acetaminophen **OR** acetaminophen, ondansetron **OR** ondansetron (ZOFRAN) IV, oxyCODONE Medications Prior to Admission:  Prior to Admission medications   Medication Sig Start Date End Date Taking? Authorizing Provider  allopurinol (ZYLOPRIM) 100 MG tablet Take 100 mg by mouth 2 (two) times daily.    Yes Historical Provider, MD  amantadine (SYMMETREL) 50 MG/5ML solution Place 10 mLs (100 mg total) into feeding tube 2 (two) times daily. 10/21/14  Yes Orson Eva, MD  Amino Acids-Protein Hydrolys (FEEDING SUPPLEMENT, PRO-STAT SUGAR FREE 64,) LIQD Take 30 mLs by mouth 3 (three) times daily with meals.   Yes Historical Provider, MD  amiodarone (PACERONE) 100 MG tablet Place 1 tablet (100 mg total) into feeding tube daily. 11/28/14  Yes Bobby Rumpf York, PA-C  aspirin 325 MG tablet Place 1 tablet (325 mg total) into feeding tube daily. 10/21/14  Yes Orson Eva, MD  cefTRIAXone (ROCEPHIN) 1 G injection Inject 1 g into the muscle daily. For 3 days only. Started on 04-26-15   Yes Historical Provider, MD  Cholecalciferol (VITAMIN D3) 2000 UNITS TABS Give 2,000 Units by tube daily.    Yes Historical Provider, MD    COLLAGEN MATRIX-SILVER EX Apply 1 application topically daily.   Yes Historical Provider, MD  Cranberry 450 MG TABS Take 450 mg by mouth 2 (two) times daily.   Yes Historical Provider, MD  Cyanocobalamin (VITAMIN B-12 PO) Give 1 tablet by tube daily. 1044m   Yes Historical Provider, MD  famotidine (PEPCID) 40 MG/5ML suspension Place 2.5 mLs (20 mg total) into feeding tube daily. 11/28/14  Yes Marianne L York, PA-C  ferrous sulfate 220 (44 FE) MG/5ML solution Place 325 mg into feeding tube daily. 7.485ms   Yes Historical Provider, MD  furosemide (LASIX) 20 MG tablet Give 20 mg by tube daily.  Yes Historical Provider, MD  insulin aspart (NOVOLOG) 100 UNIT/ML injection Inject 0-5 Units into the skin at bedtime. 11/28/14  Yes Marianne L York, PA-C  insulin aspart (NOVOLOG) 100 UNIT/ML injection Inject 0-9 Units into the skin 3 (three) times daily with meals. 11/28/14  Yes Marianne L York, PA-C  lisinopril (PRINIVIL,ZESTRIL) 5 MG tablet Take 1 tablet (5 mg total) by mouth daily. 12/01/14  Yes Florencia Reasons, MD  metoCLOPramide (REGLAN) 5 MG/5ML solution Place 5 mg into feeding tube 4 (four) times daily -  before meals and at bedtime.   Yes Historical Provider, MD  metoprolol tartrate (LOPRESSOR) 25 MG tablet Place 1 tablet (25 mg total) into feeding tube 3 (three) times daily. 12/01/14  Yes Florencia Reasons, MD  morphine (ROXANOL) 20 MG/ML concentrated solution Place 5 mg into feeding tube daily. Prior to wound care   Yes Historical Provider, MD  Multiple Vitamins-Minerals (DECUBI-VITE) CAPS Give 1 capsule by tube daily.    Yes Historical Provider, MD  Nutritional Supplements (FEEDING SUPPLEMENT, GLUCERNA 1.2 CAL,) LIQD Place 237 mLs into feeding tube 4 (four) times daily.    Yes Historical Provider, MD  oxyCODONE (OXY IR/ROXICODONE) 5 MG immediate release tablet Take 1 tablet (5 mg total) by mouth every 4 (four) hours as needed for moderate pain. 03/10/15  Yes Hosie Poisson, MD  pregabalin (LYRICA) 25 MG capsule Give 25 mg by  tube daily.    Yes Historical Provider, MD  Travoprost, BAK Free, (TRAVATAN) 0.004 % SOLN ophthalmic solution Place 1 drop into the right eye at bedtime.   Yes Historical Provider, MD  vitamin C (ASCORBIC ACID) 500 MG tablet Give 500 mg by tube daily.   Yes Historical Provider, MD  Wound Dressings (MEDIHONEY WOUND/BURN DRESSING) GEL Apply 1 application topically daily.   Yes Historical Provider, MD  Zinc 30 MG TABS Give 60 mg by tube daily.   Yes Historical Provider, MD  feeding supplement, ENSURE COMPLETE, (ENSURE COMPLETE) LIQD Take 237 mLs by mouth 3 (three) times daily with meals. Patient not taking: Reported on 11/24/2014 09/30/14   Rosalin Hawking, MD  Nutritional Supplements (FEEDING SUPPLEMENT, VITAL AF 1.2 CAL,) LIQD Place 270 mLs into feeding tube 4 (four) times daily - after meals and at bedtime. Patient not taking: Reported on 11/24/2014 09/26/14   Donzetta Starch, NP   Allergies  Allergen Reactions  . Septra [Sulfamethoxazole-Trimethoprim] Hives   CBC:    Component Value Date/Time   WBC 9.3 04/30/2015 0404   HGB 9.2* 04/30/2015 0404   HCT 29.0* 04/30/2015 0404   PLT 355 04/30/2015 0404   MCV 90.1 04/30/2015 0404   NEUTROABS 6.8 04/29/2015 0533   LYMPHSABS 1.9 04/29/2015 0533   MONOABS 0.9 04/29/2015 0533   EOSABS 0.1 04/29/2015 0533   BASOSABS 0.0 04/29/2015 0533   Comprehensive Metabolic Panel:    Component Value Date/Time   NA 141 05/01/2015 0256   K 3.6 05/01/2015 0256   CL 110 05/01/2015 0256   CO2 22 05/01/2015 0256   BUN 29* 05/01/2015 0256   CREATININE 0.70 05/01/2015 0256   GLUCOSE 130* 05/01/2015 0256   CALCIUM 9.4 05/01/2015 0256   AST 16 05/01/2015 0256   ALT 11* 05/01/2015 0256   ALKPHOS 83 05/01/2015 0256   BILITOT 0.3 05/01/2015 0256   PROT 6.0* 05/01/2015 0256   ALBUMIN 2.0* 05/01/2015 0256    Physical Exam: Vital Signs: BP 124/53 mmHg  Pulse 68  Temp(Src) 97.6 F (36.4 C) (Axillary)  Resp 14  Ht _0  (  1.499 m)  Wt 58.514 kg (129 lb)  BMI 26.04  kg/m2  SpO2 100% SpO2: SpO2: 100 % O2 Device: O2 Device: Not Delivered O2 Flow Rate: O2 Flow Rate (L/min): 1 L/min Intake/output summary:  Intake/Output Summary (Last 24 hours) at 05/02/15 1418 Last data filed at 05/02/15 0700  Gross per 24 hour  Intake   2645 ml  Output    350 ml  Net   2295 ml   LBM: Last BM Date: 05/02/15 Baseline Weight: Weight: 56.246 kg (124 lb) Most recent weight: Weight: 58.514 kg (129 lb)  Exam Findings:  GEN: [place] easily, NAD HEENT: [place], dry mm CV:Reg rate ABD: +PEG EXT: warm         Palliative Performance Scale: 30             Additional Data Reviewed: Recent Labs     04/30/15  0404  05/01/15  0256  WBC  9.3   --   HGB  9.2*   --   PLT  355   --   NA  139  141  BUN  33*  29*  CREATININE  0.74  0.70      Time Total: 45 minutes Greater than 50%  of this time was spent counseling and coordinating care related to the above assessment and plan.  Signed by: Isaac Laud, DO  Doran Clay, DO  05/02/2015, 2:18 PM  Please contact Palliative Medicine Team phone at [phone] for questions and concerns.

## 2015-05-02 NOTE — Progress Notes (Addendum)
[place] TEAM 1 - Stepdown/ICU TEAM Progress Note  Majesta Leichter ZHY:[id number] DOB: [birth year] DOA: 04/27/2015 PCP: Georgann Housekeeper, MD  Admit HPI / Brief Narrative: 81 y.o. F w/ a Hx Atrial fibrillation and DVT not on anticoagulation secondary to hemorrhagic stroke October 2015, diabetes mellitus type 2, dysphagia with PEG tube, and Stage IV sacral decubitus ulcer who was brought to the ER after found to have increasing confusion and lethargy.  The patient had been treated recently for UTI with ceftriaxone.    In the ER patient was found to be hypotensive and in A. fib with RVR. Patient's blood pressure and heart rate improved with fluid boluses. UA was consistent with UTI. CT head was negative for acute findings.   HPI/Subjective: Mental status is significantly better than yesterday, patient is awake and interactive   Assessment/Plan:  Sepsis secondary to UTI -Continue empiric antibiotics - urine culture showing gram-negative rods, based on sensitivity the patient's antibiotic has been switched from Zosyn to ciprofloxacin , would continue this for 7 days    C diff colitis  Continue Flagyl- course will need to be extended for 14 days post-discontinuation of other broad-spectrum antibiotics - flexiseal to be continued as the patient still has a lot of diarrhea    1/2 blood cx + for gram+ cocci in clusters Suspicious for contaminant -coag negative staph, vancomycin discontinued  Acute toxic metabolic encephalopathy Secondary to UTI, C diff, sedating medications, and volume depletion -CT head is unremarkable Patient appears to be somnolent and sedated, ABG within normal limits No significant improvement despite all of the above Will request palliative care consultation to discuss goals of care  Chronic A. fib with acute RVR  -improved after volume resuscitation  -not on anticoagulate secondary to intracranial hemorrhage  Hypokalemia -Corrected and follow  Hypertension    -currently well controlled off medical tx due to volume depletion   Chronic sacral decubitus ulcers and heel ulcers  -wound consult requested - patient is followed at [facility] care center  Chronic anemia  -follow Hgb trend  -Anemia panel suggests anemia of chronic disease  Diabetes mellitus type 2  -CBG currently well-controlled -Hemoglobin A1c 5.5  Triglycerides 127 LDL 141  Code Status: NO CODE - DNR  Family Communication: Spoke with family at bedside at length Disposition Plan: Possible discharge to SNF on Saturday or Monday, palliative care consultation requested for goals of care, hospice eligibility , pending  Consultants: none  Procedures: none  Antibiotics: Anti-infectives    Start     Dose/Rate Route Frequency Ordered Stop   05/01/15 1100  ciprofloxacin (CIPRO) IVPB 400 mg     400 mg 200 mL/hr over 60 Minutes Intravenous Every 12 hours 05/01/15 1023     04/29/15 1400  metroNIDAZOLE (FLAGYL) tablet 500 mg  Status:  Discontinued     500 mg Oral 3 times per day 04/29/15 1203 04/29/15 1337   04/29/15 1400  metroNIDAZOLE (FLAGYL) 50 mg/ml oral suspension 500 mg     500 mg Per Tube 3 times per day 04/29/15 1337 05/13/15 1359   04/28/15 1930  vancomycin (VANCOCIN) IVPB 750 mg/150 ml premix  Status:  Discontinued     750 mg 150 mL/hr over 60 Minutes Intravenous Every 24 hours 04/27/15 2226 04/30/15 1334   04/28/15 0400  piperacillin-tazobactam (ZOSYN) IVPB 3.375 g  Status:  Discontinued     3.375 g 12.5 mL/hr over 240 Minutes Intravenous Every 8 hours 04/28/15 0321 05/01/15 1023   04/27/15 1930  vancomycin (VANCOCIN) IVPB  1000 mg/200 mL premix     1,000 mg 200 mL/hr over 60 Minutes Intravenous  Once 04/27/15 1929 04/27/15 2229   04/27/15 1915  piperacillin-tazobactam (ZOSYN) IVPB 3.375 g     3.375 g 100 mL/hr over 30 Minutes Intravenous  Once 04/27/15 1908 04/27/15 2049       DVT prophylaxis: Lovenox  Objective: Blood pressure 124/53, pulse 68,  temperature 97.6 F (36.4 C), temperature source Axillary, resp. rate 14, height 4\' 11"  (1.499 m), weight 58.514 kg (129 lb), SpO2 100 %.  Intake/Output Summary (Last 24 hours) at 05/02/15 1055 Last data filed at 05/02/15 0700  Gross per 24 hour  Intake   2645 ml  Output    800 ml  Net   1845 ml   Exam: General more awake and alert Lungs: Clear to auscultation bilaterally without wheezes or crackles Cardiovascular: Regular rate without murmur gallop or rub  Abdomen: Nontender, nondistended, soft, bowel sounds positive, no rebound, no ascites, no appreciable mass Extremities: No significant cyanosis, clubbing, or edema bilateral lower extremities - soft heel boots in place B LE  Neurologic unable to assess  Data Reviewed: Basic Metabolic Panel:  Recent Labs Lab 04/27/15 2011 04/28/15 0344 04/29/15 0533 04/30/15 0404 05/01/15 0256  NA 131* 135 138 139 141  K 3.9 3.8 3.2* 3.5 3.6  CL 97* 102 107 111 110  CO2 25 23 22  19* 22  GLUCOSE 103* 88 106* 110* 130*  BUN 42* 34* 33* 33* 29*  CREATININE 0.95 0.92 0.75 0.74 0.70  CALCIUM 8.9 8.8* 8.9 8.9 9.4  MG  --   --  1.7  --   --    Liver Function Tests:  Recent Labs Lab 04/27/15 2011 04/28/15 0344 04/29/15 0533 05/01/15 0256  AST 15 17 18 16   ALT 11* 11* 13* 11*  ALKPHOS 71 68 67 83  BILITOT 0.5 0.4 0.3 0.3  PROT 5.7* 5.9* 5.6* 6.0*  ALBUMIN 2.2* 2.3* 2.0* 2.0*   CBC:  Recent Labs Lab 04/27/15 2011 04/28/15 0344 04/29/15 0533 04/30/15 0404  WBC 14.2* 11.6* 9.8 9.3  NEUTROABS 10.3* 8.2* 6.8  --   HGB 8.5* 9.8* 9.0* 9.2*  HCT 26.6* 31.6* 28.9* 29.0*  MCV 90.5 90.3 90.6 90.1  PLT 350 330 358 355   Cardiac Enzymes:  Recent Labs Lab 04/28/15 0344  TROPONINI 0.06*   ProBNP (last 3 results)  Recent Labs  09/16/14 1730  PROBNP 9594.0*    CBG:  Recent Labs Lab 05/01/15 0749 05/01/15 1205 05/01/15 1710 05/01/15 2126 05/02/15 0754  GLUCAP 126* 91 112* 140* 90    Recent Results (from the past  240 hour(s))  Blood Culture (routine x 2)     Status: None (Preliminary result)   Collection Time: 04/27/15  8:02 PM  Result Value Ref Range Status   Specimen Description BLOOD RIGHT UPPER ARM  Final   Special Requests BOTTLES DRAWN AEROBIC ONLY 4CC  Final   Culture   Final           BLOOD CULTURE RECEIVED NO GROWTH TO DATE CULTURE WILL BE HELD FOR 5 DAYS BEFORE ISSUING A FINAL NEGATIVE REPORT Performed at [facility]    Report Status PENDING  Incomplete  Blood Culture (routine x 2)     Status: None   Collection Time: 04/27/15  8:11 PM  Result Value Ref Range Status   Specimen Description BLOOD RIGHT HAND  Final   Special Requests BOTTLES DRAWN AEROBIC AND ANAEROBIC 5CC EA  Final  Culture   Final    STAPHYLOCOCCUS SPECIES (COAGULASE NEGATIVE) Note: THE SIGNIFICANCE OF ISOLATING THIS ORGANISM FROM A SINGLE SET OF BLOOD CULTURES WHEN MULTIPLE SETS ARE DRAWN IS UNCERTAIN. PLEASE NOTIFY THE MICROBIOLOGY DEPARTMENT WITHIN ONE WEEK IF SPECIATION AND SENSITIVITIES ARE REQUIRED. Note: Gram Stain Report Called to,Read Back By and Verified With: CHRIS RALPH ON 6.7.2016 AT 9:22P BY WILEJ Performed at [facility]    Report Status 04/30/2015 FINAL  Final  Urine culture     Status: None   Collection Time: 04/27/15  8:30 PM  Result Value Ref Range Status   Specimen Description URINE, RANDOM  Final   Special Requests NONE  Final   Colony Count   Final    >=100,000 COLONIES/ML Performed at [facility]    Culture   Final    KLEBSIELLA PNEUMONIAE ESCHERICHIA COLI Performed at [facility]    Report Status 05/01/2015 FINAL  Final   Organism ID, Bacteria KLEBSIELLA PNEUMONIAE  Final   Organism ID, Bacteria ESCHERICHIA COLI  Final      Susceptibility   Escherichia coli - MIC*    AMPICILLIN >=32 RESISTANT Resistant     CEFAZOLIN <=4 SENSITIVE Sensitive     CEFTRIAXONE <=1 SENSITIVE Sensitive     CIPROFLOXACIN 1 SENSITIVE Sensitive     GENTAMICIN <=1  SENSITIVE Sensitive     LEVOFLOXACIN 1 SENSITIVE Sensitive     NITROFURANTOIN <=16 SENSITIVE Sensitive     TOBRAMYCIN <=1 SENSITIVE Sensitive     TRIMETH/SULFA <=20 SENSITIVE Sensitive     PIP/TAZO <=4 SENSITIVE Sensitive     * ESCHERICHIA COLI   Klebsiella pneumoniae - MIC*    AMPICILLIN >=32 RESISTANT Resistant     CEFAZOLIN >=64 RESISTANT Resistant     CEFTRIAXONE 32 INTERMEDIATE Intermediate     CIPROFLOXACIN <=0.25 SENSITIVE Sensitive     GENTAMICIN >=16 RESISTANT Resistant     LEVOFLOXACIN <=0.12 SENSITIVE Sensitive     NITROFURANTOIN 64 INTERMEDIATE Intermediate     TOBRAMYCIN 2 SENSITIVE Sensitive     TRIMETH/SULFA <=20 SENSITIVE Sensitive     PIP/TAZO 64 INTERMEDIATE Intermediate     * KLEBSIELLA PNEUMONIAE  MRSA PCR Screening     Status: None   Collection Time: 04/28/15  4:22 PM  Result Value Ref Range Status   MRSA by PCR NEGATIVE NEGATIVE Final    Comment:        The GeneXpert MRSA Assay (FDA approved for NASAL specimens only), is one component of a comprehensive MRSA colonization surveillance program. It is not intended to diagnose MRSA infection nor to guide or monitor treatment for MRSA infections.   Clostridium Difficile by PCR (not at [facility])     Status: Abnormal   Collection Time: 04/29/15  5:19 AM  Result Value Ref Range Status   C difficile by pcr POSITIVE (A) NEGATIVE Final    Comment: CRITICAL RESULT CALLED TO, READ BACK BY AND VERIFIED WITH: HARRIS,LATAZIA RN @ [phone] 04/29/15 LEONARD,A      Studies:  Recent x-ray studies have been reviewed in detail by the Attending Physician  Scheduled Meds:  Scheduled Meds: . allopurinol  100 mg Per Tube BID  . amantadine  100 mg Per Tube BID  . amiodarone  100 mg Per Tube Daily  . aspirin  325 mg Per Tube Daily  . cholecalciferol  2,000 Units Oral Daily  . ciprofloxacin  400 mg Intravenous Q12H  . enoxaparin (LOVENOX) injection  40 mg Subcutaneous Q24H  . famotidine  20 mg Per Tube Daily  . feeding  supplement (GLUCERNA 1.2 CAL)  237 mL Per Tube QID  . feeding supplement (PRO-STAT SUGAR FREE 64)  30 mL Per Tube TID WC  . ferrous sulfate  325 mg Per Tube Daily  . insulin aspart  0-9 Units Subcutaneous TID WC  . latanoprost  1 drop Right Eye QHS  . metoCLOPramide  5 mg Per Tube TID AC & HS  . metoprolol tartrate  25 mg Per Tube TID  . metroNIDAZOLE  500 mg Per Tube 3 times per day  . morphine CONCENTRATE  5 mg Per Tube Daily  . multivitamin-lutein  1 capsule Per Tube Daily  . zinc sulfate  220 mg Per Tube Daily    Time spent on care of this patient: 35 mins  Richarda Overlie, MD [facility] For Consults/Admissions - Flow Manager - [phone] Office  [phone]  Contact MD directly via text page:      [email or web address]      password [email or web address]  05/02/2015, 10:55 AM   LOS: 5 days

## 2015-05-03 ENCOUNTER — Inpatient Hospital Stay (HOSPITAL_COMMUNITY): Payer: PPO

## 2015-05-03 DIAGNOSIS — R1314 Dysphagia, pharyngoesophageal phase: Secondary | ICD-10-CM

## 2015-05-03 DIAGNOSIS — D649 Anemia, unspecified: Secondary | ICD-10-CM

## 2015-05-03 LAB — GLUCOSE, CAPILLARY
GLUCOSE-CAPILLARY: 133 mg/dL — AB (ref 65–99)
Glucose-Capillary: 141 mg/dL — ABNORMAL HIGH (ref 65–99)
Glucose-Capillary: 119 mg/dL — ABNORMAL HIGH (ref 65–99)
Glucose-Capillary: 129 mg/dL — ABNORMAL HIGH (ref 65–99)

## 2015-05-03 MED ORDER — FERROUS SULFATE 300 (60 FE) MG/5ML PO SYRP
300.0000 mg | ORAL_SOLUTION | Freq: Every day | ORAL | Status: DC
  Administered 2015-05-04 – 2015-05-05 (×2): 300 mg via ORAL
  Filled 2015-05-03 (×2): qty 5

## 2015-05-03 NOTE — Progress Notes (Signed)
Daily Progress Note   Patient Name: Susan Calhoun       Date: 05/03/2015 DOB: June 06, 1928  Age: 81 y.o. MRN#: [id number] Attending Physician: Reyne Dumas, MD Primary Care Physician: Wenda Low, MD Admit Date: 04/27/2015  Reason for Consultation/Follow-up: Establishing goals of care  Subjective: More lethargic today, nonverbal with me and I am unable to obtain ROS 2/2 encephalopathy  Length of Stay: 6 days  Current Medications: Scheduled Meds:  . allopurinol  100 mg Per Tube BID  . amantadine  100 mg Per Tube BID  . amiodarone  100 mg Per Tube Daily  . aspirin  325 mg Per Tube Daily  . cholecalciferol  2,000 Units Oral Daily  . ciprofloxacin  400 mg Intravenous Q12H  . enoxaparin (LOVENOX) injection  40 mg Subcutaneous Q24H  . famotidine  20 mg Per Tube Daily  . feeding supplement (GLUCERNA 1.2 CAL)  237 mL Per Tube QID  . feeding supplement (PRO-STAT SUGAR FREE 64)  30 mL Per Tube TID WC  . [START ON 05/04/2015] ferrous sulfate  300 mg Oral Q breakfast  . insulin aspart  0-9 Units Subcutaneous TID WC  . latanoprost  1 drop Right Eye QHS  . metoCLOPramide  5 mg Per Tube TID AC & HS  . metoprolol tartrate  25 mg Per Tube TID  . metroNIDAZOLE  500 mg Per Tube 3 times per day  . morphine CONCENTRATE  5 mg Per Tube Daily  . multivitamin-lutein  1 capsule Per Tube Daily  . zinc sulfate  220 mg Per Tube Daily    Continuous Infusions: . sodium chloride 1,000 mL (05/02/15 1644)    PRN Meds: acetaminophen **OR** acetaminophen, ondansetron **OR** ondansetron (ZOFRAN) IV, oxyCODONE    Vital Signs: BP 175/84 mmHg  Pulse 101  Temp(Src) 98 F (36.7 C) (Oral)  Resp 22  Ht _0  (1.499 m)  Wt 58.514 kg (129 lb)  BMI 26.04 kg/m2  SpO2 97% SpO2: SpO2: 97 % O2 Device: O2 Device: Not Delivered O2 Flow Rate: O2 Flow Rate (L/min): 1 L/min  Intake/output summary:  Intake/Output Summary (Last 24 hours) at 05/03/15 1347 Last data filed at 05/03/15 1136  Gross per 24 hour    Intake    200 ml  Output    675 ml  Net   -475 ml   Baseline Weight: Weight: 56.246 kg (124 lb) Most recent weight: Weight: 58.514 kg (129 lb)  Physical Exam: GEN: arouses to voice, NAD HEENT: [place], sclera anicteric CV: tachy ABD: soft, +PEG EXT: warm            Additional Data Reviewed: Recent Labs     05/01/15  0256  NA  141  BUN  29*  CREATININE  0.70     Problem List:  Patient Active Problem List   Diagnosis Date Noted  . Malnutrition of moderate degree 04/29/2015  . Atrial fibrillation with RVR 04/28/2015  . Chronic anemia 04/28/2015  . Diabetes mellitus type 2, controlled 04/28/2015  . Sepsis 04/28/2015  . Decubitus ulcer of sacral region, stage 4 04/28/2015  . Atrial fibrillation with rapid ventricular response   . Sepsis secondary to UTI   . DVT (deep venous thrombosis) 03/05/2015  . Occlusion of right femoral artery 03/05/2015  . Stage IV pressure ulcer of sacral region 03/05/2015  . Stage III pressure ulcer of heel 03/05/2015  . Diabetes mellitus 03/05/2015  . Complication of feeding tube   . Bradycardia 11/26/2014  . Urinary tract infection 11/25/2014  .  Elevated LFTs 11/25/2014  . Acute renal failure 11/25/2014  . Acute cystitis without hematuria   . AKI (acute kidney injury)   . DNR (do not resuscitate) discussion 10/20/2014  . Palliative care encounter 10/20/2014  . Dysphagia, pharyngoesophageal phase 10/17/2014  . Aphasia 10/09/2014  . UTI (lower urinary tract infection) 10/08/2014  . Altered mental status   . Weakness generalized   . Intraventricular hemorrhage   . Tremor   . Urinary tract infectious disease   . Malnutrition   . Acute diastolic heart failure 04/88/8916  . Wide-complex tachycardia 09/16/2014  . Intracerebral hemorrhage 09/09/2014  . UTI (urinary tract infection) 09/02/2014  . Diabetes mellitus with diabetic polyneuropathy   . A-fib   . Hypertension   . Acute encephalopathy 09/01/2014     Palliative Care Assessment &  Plan  81 yo female with PMHx of Afib, DVT, ICH (so no AntiCoag), dysphagia s/p PEG admitted with encephalopathy in setting of sepsis 2/2 UTI and c-diff  Code Status:  DNR  Goals of Care:  Met today with daughter [name] as well as granddaughter Beckie Busing (and her husband).  We discussed events in hospital including fevers overnight and concern for aspiration as I discussed with Dr Allyson Sabal.  Family tells me that prior to this hospitalization she had been making marked recovery from [place] last year.  They acknowledge she has had setbacks and repeat hospitalizations, but that 2 weeks ago she was able to ride exercise bike, eating enough orally to be off tube feeds for the past 6 weeks, heel and sacral wounds reportedly making good progress toward healing and they were discussing discharging her home soon from rehab. She also had been followed by outpatient palliative care services whom they tell me had signed off because she was doing so well.  She was interactive with family, picking out clothes to wear, and they feel doing quite well until this occurred.  We talked in general about how devastating these setbacks can be, and concern that recurrent setbacks may make it difficult to get back on their described path of progress. At this time they would like to see how she can do with continued restorative efforts and they will try to prepare in case things don't go as they hope. They are open to outpatient palliative care services again at SNF should she make progress here.   3. Symptom Management:  Acute Encephalopathy- Unfortunately setback with fevers and possible PNA.  Hypoactive. Would continue to avoid benzo's  Dysphagia- on dysphagia diet at [facility] with encephalopathy certainly could have setback. I will ask speech to eval per family request.   4. Prognosis: Unable to determine  5. Discharge Planning: [facility] for rehab with Palliative care service follow-up   Care plan was discussed  with Family  Thank you for allowing the Palliative Medicine Team to assist in the care of this patient.   Total Time: 45 minutes Greater than 50%  of this time was spent counseling and coordinating care related to the above assessment and plan.   Doran Clay, DO  05/03/2015, 1:47 PM  Please contact Palliative Medicine Team phone at [phone] for questions and concerns.

## 2015-05-03 NOTE — Progress Notes (Addendum)
[place] [facility] TEAM 1 - Stepdown/ICU TEAM Progress Note  Susan Calhoun ZOX:[id number] DOB: 12/21/1927 DOA: 04/27/2015 PCP: Georgann Housekeeper, MD  Admit HPI / Brief Narrative: 81 y.o. F w/ a Hx Atrial fibrillation and DVT not on anticoagulation secondary to hemorrhagic stroke October 2015, diabetes mellitus type 2, dysphagia with PEG tube, and Stage IV sacral decubitus ulcer who was brought to the ER after found to have increasing confusion and lethargy.  The patient had been treated recently for UTI with ceftriaxone.    In the ER patient was found to be hypotensive and in A. fib with RVR. Patient's blood pressure and heart rate improved with fluid boluses. UA was consistent with UTI. CT head was negative for acute findings.   HPI/Subjective: Patient spiked a fever yesterday of 101.6, tachycardic, she had one episode of vomiting yesterday, cannot rule out aspiration, currently on room air   Assessment/Plan:  Sepsis secondary to UTI -Continue empiric antibiotics - urine culture showing gram-negative rods, based on sensitivity the patient's antibiotic has been switched from Zosyn to ciprofloxacin , would continue this for 7 days  Patient had a fever again last night, currently afebrile If fever recurs will obtain blood cultures, We will repeat a chest x-ray today, currently on ciprofloxacin and Flagyl which should be adequate for possible aspiration,avoid escalating abx due to  cdiff   C diff colitis  Continue Flagyl- course will need to be extended for 14 days post-discontinuation of other broad-spectrum antibiotics - attempt to discontinue flexiseal prior to transfer back to the nursing home  1/2 blood cx + for gram+ cocci in clusters Suspicious for contaminant -coag negative staph, vancomycin discontinued  Acute toxic metabolic encephalopathy Secondary to UTI, C diff, sedating medications, and volume depletion -CT head is unremarkable ABG within normal limits Appreciate palliative care  consultation to discuss goals of care, overall prognosis is poor  Chronic A. fib with acute RVR  -improved after volume resuscitation  -not on anticoagulate secondary to intracranial hemorrhage  Hypokalemia -Corrected and follow  Hypertension  -currently well controlled off medical tx due to volume depletion   Chronic sacral decubitus ulcers and heel ulcers ,. Decubitus ulcer of sacral region, stage 4 patient is followed at [facility] wound care center  Chronic anemia  -follow Hgb trend  -Anemia panel suggests anemia of chronic disease  Diabetes mellitus type 2  -CBG currently well-controlled -Hemoglobin A1c 5.5  Triglycerides 127 LDL 141  Code Status: NO CODE - DNR  Family Communication: Spoke with family at bedside at length Disposition Plan: Possible discharge to SNF on Monday, palliative care consultation requested for goals of care, hospice eligibility , pending  Consultants: none  Procedures: none  Antibiotics: Anti-infectives    Start     Dose/Rate Route Frequency Ordered Stop   05/01/15 1100  ciprofloxacin (CIPRO) IVPB 400 mg     400 mg 200 mL/hr over 60 Minutes Intravenous Every 12 hours 05/01/15 1023     04/29/15 1400  metroNIDAZOLE (FLAGYL) tablet 500 mg  Status:  Discontinued     500 mg Oral 3 times per day 04/29/15 1203 04/29/15 1337   04/29/15 1400  metroNIDAZOLE (FLAGYL) 50 mg/ml oral suspension 500 mg     500 mg Per Tube 3 times per day 04/29/15 1337 05/13/15 1359   04/28/15 1930  vancomycin (VANCOCIN) IVPB 750 mg/150 ml premix  Status:  Discontinued     750 mg 150 mL/hr over 60 Minutes Intravenous Every 24 hours 04/27/15 2226 04/30/15 1334   04/28/15 0400  piperacillin-tazobactam (ZOSYN) IVPB 3.375 g  Status:  Discontinued     3.375 g 12.5 mL/hr over 240 Minutes Intravenous Every 8 hours 04/28/15 0321 05/01/15 1023   04/27/15 1930  vancomycin (VANCOCIN) IVPB 1000 mg/200 mL premix     1,000 mg 200 mL/hr over 60 Minutes Intravenous  Once 04/27/15  1929 04/27/15 2229   04/27/15 1915  piperacillin-tazobactam (ZOSYN) IVPB 3.375 g     3.375 g 100 mL/hr over 30 Minutes Intravenous  Once 04/27/15 1908 04/27/15 2049       DVT prophylaxis: Lovenox  Objective: Blood pressure 175/84, pulse 101, temperature 98 F (36.7 C), temperature source Oral, resp. rate 22, height  (1.499 m), weight 58.514 kg (129 lb), SpO2 97 %.  Intake/Output Summary (Last 24 hours) at 05/03/15 1114 Last data filed at 05/03/15 0854  Gross per 24 hour  Intake    200 ml  Output    375 ml  Net   -175 ml   Exam: General More lethargic today, nonverbal  Lungs: Clear to auscultation bilaterally without wheezes or crackles Cardiovascular: Regular rate without murmur gallop or rub  Abdomen: Nontender, nondistended, soft, bowel sounds positive, no rebound, no ascites, no appreciable mass Extremities: No significant cyanosis, clubbing, or edema bilateral lower extremities - soft heel boots in place B LE  Neurologic unable to assess  Data Reviewed: Basic Metabolic Panel:  Recent Labs Lab 04/27/15 2011 04/28/15 0344 04/29/15 0533 04/30/15 0404 05/01/15 0256  NA 131* 135 138 139 141  K 3.9 3.8 3.2* 3.5 3.6  CL 97* 102 107 111 110  CO2 19* 22  GLUCOSE 103* 88 106* 110* 130*  BUN 42* 34* 33* 33* 29*  CREATININE 0.95 0.92 0.75 0.74 0.70  CALCIUM 8.9 8.8* 8.9 8.9 9.4  MG  --   --  1.7  --   --    Liver Function Tests:  Recent Labs Lab 04/27/15 2011 04/28/15 0344 04/29/15 0533 05/01/15 0256  AST ALT 11* 11* 13* 11*  ALKPHOS 71 68 67 83  BILITOT 0.5 0.4 0.3 0.3  PROT 5.7* 5.9* 5.6* 6.0*  ALBUMIN 2.2* 2.3* 2.0* 2.0*   CBC:  Recent Labs Lab 04/27/15 2011 04/28/15 0344 04/29/15 0533 04/30/15 0404  WBC 14.2* 11.6* 9.8 9.3  NEUTROABS 10.3* 8.2* 6.8  --   HGB 8.5* 9.8* 9.0* 9.2*  HCT 26.6* 31.6* 28.9* 29.0*  MCV 90.5 90.3 90.6 90.1  PLT 350 330 358 355   Cardiac Enzymes:  Recent Labs Lab 04/28/15 0344    TROPONINI 0.06*   ProBNP (last 3 results)  Recent Labs  09/16/14 1730  PROBNP 9594.0*    CBG:  Recent Labs Lab 05/02/15 0754 05/02/15 1227 05/02/15 1649 05/02/15 2150 05/03/15 0731  GLUCAP 90 96 140* 137* 141*    Recent Results (from the past 240 hour(s))  Blood Culture (routine x 2)     Status: None (Preliminary result)   Collection Time: 04/27/15  8:02 PM  Result Value Ref Range Status   Specimen Description BLOOD RIGHT UPPER ARM  Final   Special Requests BOTTLES DRAWN AEROBIC ONLY 4CC  Final   Culture   Final           BLOOD CULTURE RECEIVED NO GROWTH TO DATE CULTURE WILL BE HELD FOR 5 DAYS BEFORE ISSUING A FINAL NEGATIVE REPORT Performed at [facility]    Report Status PENDING  Incomplete  Blood Culture (routine x 2)  Status: None   Collection Time: 04/27/15  8:11 PM  Result Value Ref Range Status   Specimen Description BLOOD RIGHT HAND  Final   Special Requests BOTTLES DRAWN AEROBIC AND ANAEROBIC 5CC EA  Final   Culture   Final    STAPHYLOCOCCUS SPECIES (COAGULASE NEGATIVE) Note: THE SIGNIFICANCE OF ISOLATING THIS ORGANISM FROM A SINGLE SET OF BLOOD CULTURES WHEN MULTIPLE SETS ARE DRAWN IS UNCERTAIN. PLEASE NOTIFY THE MICROBIOLOGY DEPARTMENT WITHIN ONE WEEK IF SPECIATION AND SENSITIVITIES ARE REQUIRED. Note: Gram Stain Report Called to,Read Back By and Verified With: CHRIS RALPH ON 6.7.2016 AT 9:22P BY [email or web address] Performed at [facility]    Report Status 04/30/2015 FINAL  Final  Urine culture     Status: None   Collection Time: 04/27/15  8:30 PM  Result Value Ref Range Status   Specimen Description URINE, RANDOM  Final   Special Requests NONE  Final   Colony Count   Final    >=100,000 COLONIES/ML Performed at [facility]    Culture   Final    KLEBSIELLA PNEUMONIAE ESCHERICHIA COLI Performed at [facility]    Report Status 05/01/2015 FINAL  Final   Organism ID, Bacteria KLEBSIELLA PNEUMONIAE  Final   Organism ID,  Bacteria ESCHERICHIA COLI  Final      Susceptibility   Escherichia coli - MIC*    AMPICILLIN >=32 RESISTANT Resistant     CEFAZOLIN <=4 SENSITIVE Sensitive     CEFTRIAXONE <=1 SENSITIVE Sensitive     CIPROFLOXACIN 1 SENSITIVE Sensitive     GENTAMICIN <=1 SENSITIVE Sensitive     LEVOFLOXACIN 1 SENSITIVE Sensitive     NITROFURANTOIN <=16 SENSITIVE Sensitive     TOBRAMYCIN <=1 SENSITIVE Sensitive     TRIMETH/SULFA <=20 SENSITIVE Sensitive     PIP/TAZO <=4 SENSITIVE Sensitive     * ESCHERICHIA COLI   Klebsiella pneumoniae - MIC*    AMPICILLIN >=32 RESISTANT Resistant     CEFAZOLIN >=64 RESISTANT Resistant     CEFTRIAXONE 32 INTERMEDIATE Intermediate     CIPROFLOXACIN <=0.25 SENSITIVE Sensitive     GENTAMICIN >=16 RESISTANT Resistant     LEVOFLOXACIN <=0.12 SENSITIVE Sensitive     NITROFURANTOIN 64 INTERMEDIATE Intermediate     TOBRAMYCIN 2 SENSITIVE Sensitive     TRIMETH/SULFA <=20 SENSITIVE Sensitive     PIP/TAZO 64 INTERMEDIATE Intermediate     * KLEBSIELLA PNEUMONIAE  MRSA PCR Screening     Status: None   Collection Time: 04/28/15  4:22 PM  Result Value Ref Range Status   MRSA by PCR NEGATIVE NEGATIVE Final    Comment:        The GeneXpert MRSA Assay (FDA approved for NASAL specimens only), is one component of a comprehensive MRSA colonization surveillance program. It is not intended to diagnose MRSA infection nor to guide or monitor treatment for MRSA infections.   Clostridium Difficile by PCR (not at [facility])     Status: Abnormal   Collection Time: 04/29/15  5:19 AM  Result Value Ref Range Status   C difficile by pcr POSITIVE (A) NEGATIVE Final    Comment: CRITICAL RESULT CALLED TO, READ BACK BY AND VERIFIED WITH: HARRIS,LATAZIA RN @ [phone] 04/29/15 LEONARD,A      Studies:  Recent x-ray studies have been reviewed in detail by the Attending Physician  Scheduled Meds:  Scheduled Meds: . allopurinol  100 mg Per Tube BID  . amantadine  100 mg Per Tube BID  .  amiodarone  100 mg Per  Tube Daily  . aspirin  325 mg Per Tube Daily  . cholecalciferol  2,000 Units Oral Daily  . ciprofloxacin  400 mg Intravenous Q12H  . enoxaparin (LOVENOX) injection  40 mg Subcutaneous Q24H  . famotidine  20 mg Per Tube Daily  . feeding supplement (GLUCERNA 1.2 CAL)  237 mL Per Tube QID  . feeding supplement (PRO-STAT SUGAR FREE 64)  30 mL Per Tube TID WC  . ferrous sulfate  325 mg Per Tube Daily  . insulin aspart  0-9 Units Subcutaneous TID WC  . latanoprost  1 drop Right Eye QHS  . metoCLOPramide  5 mg Per Tube TID AC & HS  . metoprolol tartrate  25 mg Per Tube TID  . metroNIDAZOLE  500 mg Per Tube 3 times per day  . morphine CONCENTRATE  5 mg Per Tube Daily  . multivitamin-lutein  1 capsule Per Tube Daily  . zinc sulfate  220 mg Per Tube Daily    Time spent on care of this patient: 35 mins  Richarda Overlie, MD [facility] For Consults/Admissions - Flow Manager - [phone] Office  [phone]  Contact MD directly via text page:      [email or web address]      password [facility]  05/03/2015, 11:14 AM   LOS: 6 days

## 2015-05-04 LAB — CBC
HEMATOCRIT: 27.1 % — AB (ref 36.0–46.0)
Hemoglobin: 8.7 g/dL — ABNORMAL LOW (ref 12.0–15.0)
MCH: 27.8 pg (ref 26.0–34.0)
MCHC: 32.1 g/dL (ref 30.0–36.0)
MCV: 86.6 fL (ref 78.0–100.0)
PLATELETS: 366 10*3/uL (ref 150–400)
RBC: 3.13 MIL/uL — AB (ref 3.87–5.11)
RDW: 16.6 % — AB (ref 11.5–15.5)
WBC: 12.2 10*3/uL — ABNORMAL HIGH (ref 4.0–10.5)

## 2015-05-04 LAB — GLUCOSE, CAPILLARY
Glucose-Capillary: 115 mg/dL — ABNORMAL HIGH (ref 65–99)
Glucose-Capillary: 104 mg/dL — ABNORMAL HIGH (ref 65–99)
Glucose-Capillary: 125 mg/dL — ABNORMAL HIGH (ref 65–99)
Glucose-Capillary: 136 mg/dL — ABNORMAL HIGH (ref 65–99)

## 2015-05-04 LAB — CULTURE, BLOOD (ROUTINE X 2): Culture: NO GROWTH

## 2015-05-04 MED ORDER — ONDANSETRON HCL 4 MG PO TABS: 4.0000 mg | ORAL_TABLET | Freq: Four times a day (QID) | ORAL | Status: AC | PRN

## 2015-05-04 MED ORDER — METRONIDAZOLE 50 MG/ML ORAL SUSPENSION: 500.0000 mg | Freq: Three times a day (TID) | ORAL | Status: AC

## 2015-05-04 MED ORDER — CIPROFLOXACIN HCL 500 MG PO TABS: 500.0000 mg | ORAL_TABLET | Freq: Two times a day (BID) | ORAL | Status: AC

## 2015-05-04 MED ORDER — OXYCODONE HCL 5 MG PO TABS: 2.5000 mg | ORAL_TABLET | Freq: Four times a day (QID) | ORAL | Status: AC | PRN

## 2015-05-04 MED ORDER — CIPROFLOXACIN HCL 500 MG PO TABS
500.0000 mg | ORAL_TABLET | Freq: Two times a day (BID) | ORAL | Status: DC
  Administered 2015-05-04 – 2015-05-05 (×2): 500 mg via ORAL
  Filled 2015-05-04 (×2): qty 1

## 2015-05-04 MED ORDER — SACCHAROMYCES BOULARDII 250 MG PO CAPS: 250.0000 mg | ORAL_CAPSULE | Freq: Two times a day (BID) | ORAL | Status: AC

## 2015-05-04 NOTE — Evaluation (Signed)
Clinical/Bedside Swallow Evaluation Patient Details  Name: Nolah Krenzer MRN: [id number] Date of Birth: 11/12/1928  Today's Date: 05/04/2015 Time: SLP Start Time (ACUTE ONLY): 1020 SLP Stop Time (ACUTE ONLY): 1040 SLP Time Calculation (min) (ACUTE ONLY): 20 min  Past Medical History:  Past Medical History  Diagnosis Date  . A-fib   . Hypertension   . Glaucoma     right eye   . Carotid artery stenosis   . PVD (peripheral vascular disease)   . Hypercholesteremia   . Coronary artery disease   . DVT (deep venous thrombosis) 03/2015  . Type II diabetes mellitus   . History of blood transfusion     "related to OR"   . Stroke 08/2014    "hemorrhagic; some short term memory loss; weak since" (04/28/2015)  . Arthritis     "all over"   . Hepatitis 1960's?    "don't know what kind; got it in the hospital"  . Gout   . Renal disorder   . Chronic kidney disease (CKD), stage II (mild)   . Chronic anemia    Past Surgical History:  Past Surgical History  Procedure Laterality Date  . Replacement total knee Right 1990's  . Carotid endarterectomy Right 2015  . Atrial fibrillation ablation  2015  . Joint replacement    . Coronary artery bypass graft  ~ 2006    "done in [place]"  . Cardiac catheterization  ~ 2006  . Cataract extraction w/ intraocular lens  implant, bilateral Bilateral   . Coronary angioplasty    . Vena cava filter placement  03/2015   HPI:  Sakeena Teall is a 81 y.o. female with history of atrial fibrillation and DVT nor any anticoagulation secondary to history of intracranial bleed, diabetes mellitus type 2, dysphagia with PEG tube was brought to the ER after patient was found to have increasing confusion and lethargic.t found to have UTI. Pt has been since multiple times by SLP since ICH due to a history of poor PO intake due to cognitive impairment. When pt is alert and responsive she is able to consume purees and thin liquids without high risk of aspiration.     Assessment / Plan / Recommendation Clinical Impression  Pt demonstrates function consistent with prior observations when acutely ill. Pt oral strength and swallow response are intact. At the time of assessment she is alert, but does not follow cues and context with feeding as well as she typically does. SLP had to offer max tactile cues for pt to open lips for PO. She does purse lips in readiness for cup sips, but only allows minimal liquids to enter. WIth both liqudis and purees there appears to be brief oral holding.   Despite cognition impairing PO intake, there are no signs of aspiration and as long as pt is alert and responsive to attempts, do not feel her risk of aspiration is any higher than baseline. Recommend pt continue tube feeds, but be offered purees and thin liquids periodically. Expect intake to improve as infection clears up.     Aspiration Risk  Mild    Diet Recommendation Dysphagia 1 (Puree);Thin   Medication Administration: Crushed with puree Compensations: Slow rate;Small sips/bites;Check for pocketing    Other  Recommendations Oral Care Recommendations: Oral care BID   Follow Up Recommendations       Frequency and Duration min 2x/week  1 week   Pertinent Vitals/Pain NA    SLP Swallow Goals     Swallow Study Prior Functional  Status       General Other Pertinent Information: Analiyah Boulton is a 81 y.o. female with history of atrial fibrillation and DVT nor any anticoagulation secondary to history of intracranial bleed, diabetes mellitus type 2, dysphagia with PEG tube was brought to the ER after patient was found to have increasing confusion and lethargic.t found to have UTI. Pt has been since multiple times by SLP since ICH due to a history of poor PO intake due to cognitive impairment. When pt is alert and responsive she is able to consume purees and thin liquids without high risk of aspiration.  Type of Study: Bedside swallow evaluation Previous Swallow  Assessment: see HPI Diet Prior to this Study: Dysphagia 1 (puree);Thin liquids Temperature Spikes Noted: No Respiratory Status: Supplemental O2 delivered via (comment) History of Recent Intubation: No Behavior/Cognition: Alert;Confused;Requires cueing;Doesn't follow directions Oral Cavity - Dentition: Missing dentition Self-Feeding Abilities: Needs set up Patient Positioning: Upright in bed Baseline Vocal Quality: Low vocal intensity Volitional Cough: Cognitively unable to elicit Volitional Swallow: Unable to elicit    Oral/Motor/Sensory Function Overall Oral Motor/Sensory Function:  (Pt deos nto follow commands, no focal weakness)   Ice Chips     Thin Liquid Thin Liquid: Impaired Presentation: Straw;Cup Oral Phase Impairments: Reduced labial seal;Impaired anterior to posterior transit;Poor awareness of bolus Oral Phase Functional Implications: Right anterior spillage;Left anterior spillage;Prolonged oral transit;Oral holding    Nectar Thick Nectar Thick Liquid: Not tested   Honey Thick Honey Thick Liquid: Not tested   Puree Puree: Impaired Presentation: Spoon Oral Phase Impairments: Impaired anterior to posterior transit (minimally opens mouth) Oral Phase Functional Implications: Prolonged oral transit;Oral holding   Solid   GO    Solid: Not tested      Harlon Ditty, MA CCC-SLP [phone]  Claudine Mouton 05/04/2015,11:10 AM

## 2015-05-04 NOTE — Progress Notes (Signed)
Medicare Important Message given? YES  Date Medicare IM given:  05/04/2015 Medicare IM given by: Isidoro Donning

## 2015-05-04 NOTE — Discharge Summary (Addendum)
Physician Discharge Summary  Vermell Madrid MRN: [id number] DOB/AGE: 12/23/1927 81 y.o.  PCP: Georgann Housekeeper, MD   Admit date: 04/27/2015 Discharge date: 05/04/2015  Discharge Diagnoses:     Principal Problem:   Sepsis Active Problems:   Acute encephalopathy   Urinary tract infection   Atrial fibrillation with RVR   Chronic anemia   Diabetes mellitus type 2, controlled   Decubitus ulcer of sacral region, stage 4   Sepsis secondary to UTI   Malnutrition of moderate degree  C. difficile colitis Aspiration pneumonia   Follow-up recommendations Follow-up with PCP in 3-5 days , including although additional recommended appointments as below Follow-up CBC, CMP in 3-5 days Palliative care to follow the patient at the nursing home     Medication List    STOP taking these medications        cefTRIAXone 1 G injection  Commonly known as:  ROCEPHIN     Cranberry 450 MG Tabs     lisinopril 5 MG tablet  Commonly known as:  PRINIVIL,ZESTRIL     metoprolol tartrate 25 MG tablet  Commonly known as:  LOPRESSOR     morphine 20 MG/ML concentrated solution  Commonly known as:  ROXANOL     pregabalin 25 MG capsule  Commonly known as:  LYRICA      TAKE these medications        allopurinol 100 MG tablet  Commonly known as:  ZYLOPRIM  Take 100 mg by mouth 2 (two) times daily.     amantadine 50 MG/5ML solution  Commonly known as:  SYMMETREL  Place 10 mLs (100 mg total) into feeding tube 2 (two) times daily.     amiodarone 100 MG tablet  Commonly known as:  PACERONE  Place 1 tablet (100 mg total) into feeding tube daily.     aspirin 325 MG tablet  Place 1 tablet (325 mg total) into feeding tube daily.     ciprofloxacin 500 MG tablet  Commonly known as:  CIPRO  Take 1 tablet (500 mg total) by mouth 2 (two) times daily.     COLLAGEN MATRIX-SILVER EX  Apply 1 application topically daily.     DECUBI-VITE Caps  Give 1 capsule by tube daily.     famotidine 40 MG/5ML  suspension  Commonly known as:  PEPCID  Place 2.5 mLs (20 mg total) into feeding tube daily.     feeding supplement (PRO-STAT SUGAR FREE 64) Liqd  Take 30 mLs by mouth 3 (three) times daily with meals.     feeding supplement (GLUCERNA 1.2 CAL) Liqd  Place 237 mLs into feeding tube 4 (four) times daily.     feeding supplement (VITAL AF 1.2 CAL) Liqd  Place 270 mLs into feeding tube 4 (four) times daily - after meals and at bedtime.     feeding supplement (ENSURE COMPLETE) Liqd  Take 237 mLs by mouth 3 (three) times daily with meals.     ferrous sulfate 220 (44 FE) MG/5ML solution  Place 325 mg into feeding tube daily. 7.39ml's     furosemide 20 MG tablet  Commonly known as:  LASIX  Give 20 mg by tube daily.     insulin aspart 100 UNIT/ML injection  Commonly known as:  novoLOG  Inject 0-5 Units into the skin at bedtime.     insulin aspart 100 UNIT/ML injection  Commonly known as:  novoLOG  Inject 0-9 Units into the skin 3 (three) times daily with meals.     MEDIHONEY WOUND/BURN DRESSING  Gel  Apply 1 application topically daily.     metoCLOPramide 5 MG/5ML solution  Commonly known as:  REGLAN  Place 5 mg into feeding tube 4 (four) times daily -  before meals and at bedtime.     metroNIDAZOLE 50 mg/ml oral suspension  Commonly known as:  FLAGYL  Place 10 mLs (500 mg total) into feeding tube 3 (three) times daily.     ondansetron 4 MG tablet  Commonly known as:  ZOFRAN  Place 1 tablet (4 mg total) into feeding tube every 6 (six) hours as needed for nausea.     oxyCODONE 5 MG immediate release tablet  Commonly known as:  Oxy IR/ROXICODONE  Place 0.5 tablets (2.5 mg total) into feeding tube every 6 (six) hours as needed for severe pain.     saccharomyces boulardii 250 MG capsule  Commonly known as:  FLORASTOR  Take 1 capsule (250 mg total) by mouth 2 (two) times daily.     Travoprost (BAK Free) 0.004 % Soln ophthalmic solution  Commonly known as:  TRAVATAN  Place 1  drop into the right eye at bedtime.     VITAMIN B-12 PO  Give 1 tablet by tube daily. 1000mg      vitamin C 500 MG tablet  Commonly known as:  ASCORBIC ACID  Give 500 mg by tube daily.     Vitamin D3 2000 UNITS Tabs  Give 2,000 Units by tube daily.     Zinc 30 MG Tabs  Give 60 mg by tube daily.         Discharge Condition: *Prognosis is poor  Disposition: 03-Skilled Nursing Facility   Consults:  Palliative care     Significant Diagnostic Studies:  Ct Head Wo Contrast  04/27/2015   CLINICAL DATA:  Unresponsive  EXAM: CT HEAD WITHOUT CONTRAST  TECHNIQUE: Contiguous axial images were obtained from the base of the skull through the vertex without intravenous contrast.  COMPARISON:  11/25/2014  FINDINGS: Bony calvarium is intact. Diffuse atrophic changes are seen. No findings to suggest acute hemorrhage, acute infarction or space-occupying mass lesion are identified. Mild chronic white matter ischemic change is seen.  IMPRESSION: Atrophic changes and chronic white matter ischemic change without acute abnormality.   Electronically Signed   By: Alcide Clever M.D.   On: 04/27/2015 22:58   Dg Chest Port 1 View  05/03/2015   CLINICAL DATA:  Fever.  Tachycardia.  Vomiting.  EXAM: PORTABLE CHEST - 1 VIEW  COMPARISON:  04/27/2015  FINDINGS: Patient rotated left. Prior median sternotomy. High-riding humeral heads, worse on the left. This suggest chronic rotator cuff insufficiency. Cardiomegaly accentuated by AP portable technique. Suspect small layering bilateral pleural effusions. No pneumothorax. Interstitial edema is mild and accentuated by low lung volumes. Bibasilar airspace disease. Skin fold over the right hemi thorax laterally.  IMPRESSION: Development of congestive heart failure with probable layering small bilateral pleural effusions.  Bibasilar airspace disease which could represent atelectasis or concurrent infection/aspiration.   Electronically Signed   By: Jeronimo Greaves M.D.   On:  05/03/2015 11:56   Dg Chest Port 1 View  04/27/2015   CLINICAL DATA:  Altered mental status for 2 days.  EXAM: PORTABLE CHEST - 1 VIEW  COMPARISON:  Chest radiograph 03/09/2015.  FINDINGS: Borderline cardiac enlargement. Prior CABG. No infiltrates or failure. No effusion or pneumothorax. Degenerative change both shoulders.  IMPRESSION: Stable chest.  No active infiltrates or failure.   Electronically Signed   By: Unice Bailey.D.  On: 04/27/2015 19:59        Filed Weights   04/30/15 0647 05/01/15 0357 05/02/15 0549  Weight: 59.875 kg (132 lb) 57.153 kg (126 lb) 58.514 kg (129 lb)     Microbiology: Recent Results (from the past 240 hour(s))  Blood Culture (routine x 2)     Status: None   Collection Time: 04/27/15  8:02 PM  Result Value Ref Range Status   Specimen Description BLOOD RIGHT UPPER ARM  Final   Special Requests BOTTLES DRAWN AEROBIC ONLY 4CC  Final   Culture   Final    NO GROWTH 5 DAYS Performed at [facility]    Report Status 05/04/2015 FINAL  Final  Blood Culture (routine x 2)     Status: None   Collection Time: 04/27/15  8:11 PM  Result Value Ref Range Status   Specimen Description BLOOD RIGHT HAND  Final   Special Requests BOTTLES DRAWN AEROBIC AND ANAEROBIC 5CC EA  Final   Culture   Final    STAPHYLOCOCCUS SPECIES (COAGULASE NEGATIVE) Note: THE SIGNIFICANCE OF ISOLATING THIS ORGANISM FROM A SINGLE SET OF BLOOD CULTURES WHEN MULTIPLE SETS ARE DRAWN IS UNCERTAIN. PLEASE NOTIFY THE MICROBIOLOGY DEPARTMENT WITHIN ONE WEEK IF SPECIATION AND SENSITIVITIES ARE REQUIRED. Note: Gram Stain Report Called to,Read Back By and Verified With: CHRIS RALPH ON 6.7.2016 AT 9:22P BY WILEJ Performed at [facility]    Report Status 04/30/2015 FINAL  Final  Urine culture     Status: None   Collection Time: 04/27/15  8:30 PM  Result Value Ref Range Status   Specimen Description URINE, RANDOM  Final   Special Requests NONE  Final   Colony Count   Final     >=100,000 COLONIES/ML Performed at [facility]    Culture   Final    KLEBSIELLA PNEUMONIAE ESCHERICHIA COLI Performed at [facility]    Report Status 05/01/2015 FINAL  Final   Organism ID, Bacteria KLEBSIELLA PNEUMONIAE  Final   Organism ID, Bacteria ESCHERICHIA COLI  Final      Susceptibility   Escherichia coli - MIC*    AMPICILLIN >=32 RESISTANT Resistant     CEFAZOLIN <=4 SENSITIVE Sensitive     CEFTRIAXONE <=1 SENSITIVE Sensitive     CIPROFLOXACIN 1 SENSITIVE Sensitive     GENTAMICIN <=1 SENSITIVE Sensitive     LEVOFLOXACIN 1 SENSITIVE Sensitive     NITROFURANTOIN <=16 SENSITIVE Sensitive     TOBRAMYCIN <=1 SENSITIVE Sensitive     TRIMETH/SULFA <=20 SENSITIVE Sensitive     PIP/TAZO <=4 SENSITIVE Sensitive     * ESCHERICHIA COLI   Klebsiella pneumoniae - MIC*    AMPICILLIN >=32 RESISTANT Resistant     CEFAZOLIN >=64 RESISTANT Resistant     CEFTRIAXONE 32 INTERMEDIATE Intermediate     CIPROFLOXACIN <=0.25 SENSITIVE Sensitive     GENTAMICIN >=16 RESISTANT Resistant     LEVOFLOXACIN <=0.12 SENSITIVE Sensitive     NITROFURANTOIN 64 INTERMEDIATE Intermediate     TOBRAMYCIN 2 SENSITIVE Sensitive     TRIMETH/SULFA <=20 SENSITIVE Sensitive     PIP/TAZO 64 INTERMEDIATE Intermediate     * KLEBSIELLA PNEUMONIAE  MRSA PCR Screening     Status: None   Collection Time: 04/28/15  4:22 PM  Result Value Ref Range Status   MRSA by PCR NEGATIVE NEGATIVE Final    Comment:        The GeneXpert MRSA Assay (FDA approved for NASAL specimens only), is one component of a comprehensive  MRSA colonization surveillance program. It is not intended to diagnose MRSA infection nor to guide or monitor treatment for MRSA infections.   Clostridium Difficile by PCR (not at [facility])     Status: Abnormal   Collection Time: 04/29/15  5:19 AM  Result Value Ref Range Status   C difficile by pcr POSITIVE (A) NEGATIVE Final    Comment: CRITICAL RESULT CALLED TO, READ BACK BY AND VERIFIED  WITH: HARRIS,LATAZIA RN @ [phone] 04/29/15 LEONARD,A        Blood Culture    Component Value Date/Time   SDES URINE, RANDOM 04/27/2015 2030   SPECREQUEST NONE 04/27/2015 2030   CULT  04/27/2015 2030    KLEBSIELLA PNEUMONIAE ESCHERICHIA COLI Performed at [facility]    REPTSTATUS 05/01/2015 FINAL 04/27/2015 2030      Labs: Results for orders placed or performed during the hospital encounter of 04/27/15 (from the past 48 hour(s))  Glucose, capillary     Status: None   Collection Time: 05/02/15 12:27 PM  Result Value Ref Range   Glucose-Capillary 96 65 - 99 mg/dL  Glucose, capillary     Status: Abnormal   Collection Time: 05/02/15  4:49 PM  Result Value Ref Range   Glucose-Capillary 140 (H) 65 - 99 mg/dL  Glucose, capillary     Status: Abnormal   Collection Time: 05/02/15  9:50 PM  Result Value Ref Range   Glucose-Capillary 137 (H) 65 - 99 mg/dL  Glucose, capillary     Status: Abnormal   Collection Time: 05/03/15  7:31 AM  Result Value Ref Range   Glucose-Capillary 141 (H) 65 - 99 mg/dL  Glucose, capillary     Status: Abnormal   Collection Time: 05/03/15 11:57 AM  Result Value Ref Range   Glucose-Capillary 119 (H) 65 - 99 mg/dL  Glucose, capillary     Status: Abnormal   Collection Time: 05/03/15  5:18 PM  Result Value Ref Range   Glucose-Capillary 133 (H) 65 - 99 mg/dL  Glucose, capillary     Status: Abnormal   Collection Time: 05/03/15  9:06 PM  Result Value Ref Range   Glucose-Capillary 129 (H) 65 - 99 mg/dL  CBC     Status: Abnormal   Collection Time: 05/04/15  4:54 AM  Result Value Ref Range   WBC 12.2 (H) 4.0 - 10.5 K/uL   RBC 3.13 (L) 3.87 - 5.11 MIL/uL   Hemoglobin 8.7 (L) 12.0 - 15.0 g/dL   HCT 10.2 (L) 72.5 - 36.6 %   MCV 86.6 78.0 - 100.0 fL   MCH 27.8 26.0 - 34.0 pg   MCHC 32.1 30.0 - 36.0 g/dL   RDW 44.0 (H) 34.7 - 42.5 %   Platelets 366 150 - 400 K/uL  Glucose, capillary     Status: Abnormal   Collection Time: 05/04/15  7:56 AM  Result  Value Ref Range   Glucose-Capillary 115 (H) 65 - 99 mg/dL     Lipid Panel     Component Value Date/Time   CHOL 198 04/29/2015 0533   TRIG 127 04/29/2015 0533   HDL 32* 04/29/2015 0533   CHOLHDL 6.2 04/29/2015 0533   VLDL 25 04/29/2015 0533   LDLCALC 141* 04/29/2015 0533     Lab Results  Component Value Date   HGBA1C 5.5 04/29/2015   HGBA1C 6.4* 03/05/2015   HGBA1C 5.6 09/01/2014     Lab Results  Component Value Date   LDLCALC 141* 04/29/2015   CREATININE 0.70 05/01/2015  81 y.o. F w/ a Hx Atrial fibrillation and DVT not on anticoagulation secondary to hemorrhagic stroke October 2015, diabetes mellitus type 2, dysphagia with PEG tube, and Stage IV sacral decubitus ulcer who was brought to the ER after found to have increasing confusion and lethargy. The patient had been treated recently for UTI with ceftriaxone.   In the ER patient was found to be hypotensive and in A. fib with RVR. Patient's blood pressure and heart rate improved with fluid boluses. UA was consistent with UTI. CT head was negative for acute findings.      Assessment/Plan:  Sepsis secondary to UTI/aspiration pneumonia -Continue empiric antibiotics - urine culture showing Escherichia coli/Klebsiella pneumonia, based on sensitivity the patient's antibiotic has been switched from Zosyn to ciprofloxacin , would continue this for another 5 days Patient developed a fever of 101.6 after an aspiration episode on 6/11 Therefore the patient has been made nothing by mouth to minimize recurrent aspiration Discussed with the patient's daughter, avoid feeding by mouth for at least the next 7-10 days, recommend speech therapy to reassess prior to initiating a by mouth diet Patient is on ciprofloxacin for UTI and Flagyl for C. difficile which should be adequate coverage for aspiration pneumonia Discussed with the patient's daughter, would not escalate antibiotics  given no improvement in C. difficile Would pursue  comfort care measures if the patient does not improve or deteriorates further   C diff colitis  Continue Flagyl- for another 2 weeks post discharge, And attempt to discontinue flexiseal  once the patient's diarrhea improves   1/2 blood cx + for gram+ cocci in clusters Suspicious for contaminant -coag negative staph, vancomycin discontinued  Acute toxic metabolic encephalopathy Secondary to UTI, C diff, sedating medications, and volume depletion -CT head is unremarkable ABG within normal limits Appreciate palliative care consultation to discuss goals of care, overall prognosis is poor  Chronic A. fib with acute RVR  -improved after volume resuscitation  -not on anticoagulate secondary to intracranial hemorrhage  Hypokalemia -Corrected and follow  Hypertension  -currently well controlled off medical tx due to volume depletion   Chronic sacral decubitus ulcers and heel ulcers ,. Decubitus ulcer of sacral region, stage 4 patient is followed at [facility] wound care center [employer]   with Stage IV pressure ulcer and limited movement. It does not appears she moves at all, she has bilateral foot drop. Continue Prevalon boots for offloading. Normal saline moist gauze dressing BID for sacrum  Chronic anemia  Follow hemoglobin, transfuse for active bleeding or hemoglobin less than 7.0 -Anemia panel suggests anemia of chronic disease  Diabetes mellitus type 2  -CBG currently well-controlled -Hemoglobin A1c 5.5 Triglycerides 127 LDL 141  Code Status: NO CODE - DNR     Discharge Exam:    Blood pressure 147/75, pulse 102, temperature 98.7 F (37.1 C), temperature source Oral, resp. rate 22, height 4\' 11"  (1.499 m), weight 58.514 kg (129 lb), SpO2 100 %.   GEN: arouses to voice, NAD HEENT: , sclera anicteric CV: tachy ABD: soft, +PEG EXT: warm      Discharge Instructions    Diet - low sodium heart healthy    Complete by:  As directed       Increase activity slowly    Complete by:  As directed            Follow-up Information    Follow up with Georgann Housekeeper, MD. Schedule an appointment as soon as possible for a visit in  3 days.   Specialty:  Internal Medicine   Contact information:   [address] [place] [address] [phone]       Signed: Richarda Overlie 05/04/2015, 11:25 AM        Time spent >45 mins

## 2015-05-04 NOTE — Progress Notes (Signed)
Patients flexiseal has come out twice since 7pm inflated. NP on call made aware. Orders received to leave flexiseal out. Patient cleaned up, wounds redressed and patient resting comfortably in bed.  Will continue to monitor.

## 2015-05-04 NOTE — Clinical Social Work Note (Addendum)
CSW spoke with Susan Calhoun at [facility]. Susan Calhoun and Susan Calhoun from [facility] reported that she does not have any female beds. CSW spoke with Susan Calhoun at [facility] and Susan Calhoun at [facility], they informed CSW that they are not in network with the [employer]. Susan Calhoun at [facility] and Susan Calhoun at [facility] can not meet the pt's needs.    CSW called Susan Calhoun at 11:46am to inform him of these barriers and to ask for assistant with placement. CSW placed a call to Susan Calhoun assist director to informed him.   CSW spoke with the [name] about her SNF options. Susan Calhoun reported that she will met with Susan Calhoun to dicussed the out of packet cost for the pt. Susan Calhoun also expressed concern regarding the pt's having aspiration pneumonia. Susan Calhoun requested CSW to paged the MD. CSW paged the MD.   CSW is awaiting a call back from Susan Calhoun.   Addendum: CSW had a lengthy discussion with Susan Calhoun. CSW explained the discharge process. CSW provided Susan Calhoun with her options: private pay, home, or extended search to another county. Susan Calhoun declined extending search to another county. Susan Calhoun reported that she can not provided appropriate care for the pt at home.    Addendum: CSW spoke with [name] and Designer, industrial/product. CSW informed that if pt goes to [facility] they family will have to pay privately. Susan Calhoun and [name] are working out the details.   Addendum:  CSW spoke with Susan Calhoun. Susan Calhoun reported that the pt was accepted to [facility]. CSW received a call from Susan Calhoun the admission from [facility] asking for an [name]. CSW remained Susan Calhoun from [facility] that their not in network, thus no authorization will be given. [name] from [name] explained this to [name] as well. Susan Calhoun reported that they can not take the pt. CSW remained Susan Calhoun about the purpose of [name] with the business office to worked out private pay  details.   CSW received a call from [name] at [facility]. [name] reported that she can accepted the pt. CSW spoke with [name] from [name] to start authorization for [name]. [name] reported that she will start authorization in the morning.  CSW spoke with [name] about visiting [name]. [name] reported that she will drive there and call [name].   [name], MSW, [place]

## 2015-05-05 LAB — GLUCOSE, CAPILLARY: GLUCOSE-CAPILLARY: 120 mg/dL — AB (ref 65–99)

## 2015-05-05 NOTE — Progress Notes (Signed)
A subsequent conversation was made this morning (1100am) with the patient's daughter Wilhemina Cash) and granddaughter Gabriel Rung). CSW reviewed the skilled nursing facility options and discussed insurance barriers. Understanding the full situation, the family chose to private pay and send Ms. Coy to [facility]. There is a clear understanding between all parties that the family will seek a transfer to an 'in-network' facility when a bed becomes available. The family appreciated efforts that were made on behalf of their mother.  Gretta Cool, Marine scientist Clinical Social Work Department [phone]

## 2015-05-05 NOTE — Clinical Social Work Note (Addendum)
CSW left a voice message with both Lowella Bandy from [facility] and Pocahontas from [facility] regarding the pt discharge today. CSW and Trilby Leaver discussed ambulance transport. CSW contact [facility] at [phone] to schedule transport for the pt. CSW upload the pt's discharge summary. Bedside RN can call reported to [phone].   Addendum: CSW spoke with [facility] reported that her director declined to accept the pt. CSW updated the CSW's Chiropodist. CSW asked for assist with placing the pt.   CSW left Dr. Jacky Kindle a message with updated. CSW provided updates to the case manager.   Addendum: CSW and CSW's Chiropodist spoke with the family regarding the barriers to discharge. CSW will continue to work toward placement.   Addendum: CSW spoke with the CSW's Chiropodist. CSW's Chiropodist informed the CSW that [facility] reported that they will accepted the pt with private pay until [facility] has a bed.   [place] Zirkelbach, MSW, LCSWA [phone]

## 2015-05-05 NOTE — Progress Notes (Signed)
Report called to Fleet Contras, RN at [facility] care.

## 2015-05-05 NOTE — Progress Notes (Signed)
Met today with daughter [name] as well as granddaughter Beckie Busing I discussed my concerns about recurrent aspiration, escalating her antibiotics Further. Explained to them that the patient has received maximal therapy for her pneumonia. She was on Zosyn initially and then transitioned to ciprofloxacin, which would have given her adequate coverage for any aspiration pneumonia. Patient is also currently on Flagyl for her C. difficile colitis which does not seem to be improving. Patient is currently on room air, not requiring any oxygen. Does not meet criteria for continued hospital care, patient will be discharged to SNF as soon as bed is available. RN Janett Billow present during this entire conversation

## 2015-05-05 NOTE — Clinical Social Work Placement (Signed)
   CLINICAL SOCIAL WORK PLACEMENT  NOTE  Date:  05/05/2015  Patient Details  Name: Rashanti Kujath MRN: [id number] Date of Birth: 1928/09/18  Clinical Social Work is seeking post-discharge placement for this patient at the Skilled  Nursing Facility level of care (*CSW will initial, date and re-position this form in  chart as items are completed):  Yes   Patient/family provided with [place] Clinical Social Work Department's list of facilities offering this level of care within the geographic area requested by the patient (or if unable, by the patient's family).  Yes   Patient/family informed of their freedom to choose among providers that offer the needed level of care, that participate in Medicare, Medicaid or managed care program needed by the patient, have an available bed and are willing to accept the patient.  Yes   Patient/family informed of [facility]'s ownership interest in [facility] and [facility], as well as of the fact that they are under no obligation to receive care at these facilities.  PASRR submitted to EDS on       PASRR number received on       Existing PASRR number confirmed on 05/05/15     FL2 transmitted to all facilities in geographic area requested by pt/family on       FL2 transmitted to all facilities within larger geographic area on 05/05/15     Patient informed that his/her managed care company has contracts with or will negotiate with certain facilities, including the following:        Yes   Patient/family informed of bed offers received.  Patient chooses bed at [facility]     Physician recommends and patient chooses bed at      Patient to be transferred to [facility] on 05/05/15.  Patient to be transferred to facility by [facility]      Patient family notified on 05/05/15 of transfer.  Name of family member notified:  [facility]     PHYSICIAN       Additional Comment:     _______________________________________________ Gwynne Edinger, LCSW 05/05/2015, 11:16 AM

## 2015-05-11 ENCOUNTER — Encounter (HOSPITAL_BASED_OUTPATIENT_CLINIC_OR_DEPARTMENT_OTHER): Payer: PPO | Attending: Plastic Surgery

## 2015-05-22 DEATH — deceased

## 2015-07-21 NOTE — Patient Outreach (Signed)
[facility]) Care Management  07/21/2015  Marai Teehan 04-Nov-1928 [id number]   Referral from HTA tier 4 list, assigned to Emilia Beck, [facility] for patient outreach.  Makinsey Pepitone L. Genola Yuille, AAS [facility] Care Management Assistant

## 2015-07-24 ENCOUNTER — Other Ambulatory Visit: Payer: Self-pay

## 2015-07-24 NOTE — Patient Outreach (Signed)
Call made to attempt initial contact for community care coordination. Call answered by patient's daughter at [phone].  Daughter advised this RNCM patient died in 04-25-23.   Plan: Discharge this patient from my caseload

## 2015-07-24 NOTE — Patient Outreach (Signed)
[facility]) Care Management  07/24/2015  Jaymee Tilson [birth year] [id number]   Notification received from Emilia Beck, [facility] to close case due to patient being deceased.  Nansi Birmingham L. Amreen Raczkowski, AAS [facility] Care Management Assistant

## 2015-09-07 IMAGING — CT CT HEAD W/O CM
1 of 3 series · 15 of 30 positions shown, 19 images · non-contrast
Comparison: Head CT 09/04/2014

CLINICAL DATA: Altered mental status with acute encephalopathy and
somnolence. Hydrocephalus on recent imaging.

EXAM:
CT HEAD WITHOUT CONTRAST
TECHNIQUE: Contiguous axial images were obtained from the base of the skull
through the vertex without intravenous contrast.

[Series 2: head 5.0 h30s · axial · 0.39mm/px · z∈[-135,+0]mm · 15 of 31 slices shown, 19 images]
[im 2/31  brain]
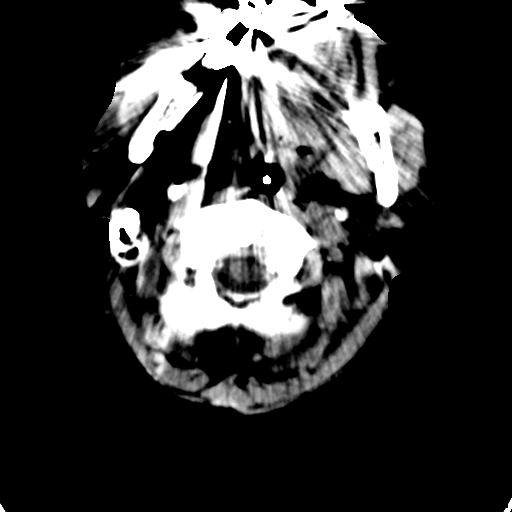
[im 2/31  bone]
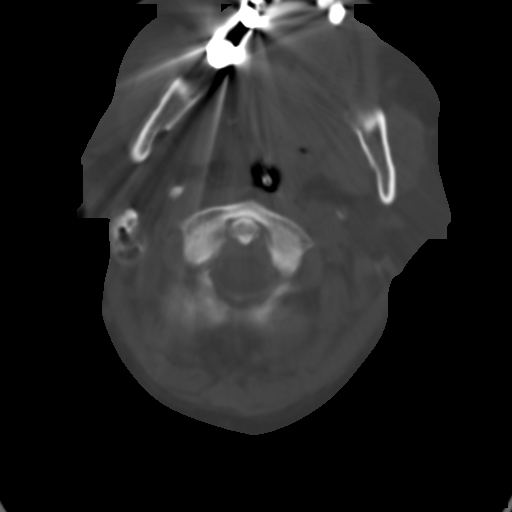
[im 4/31  brain]
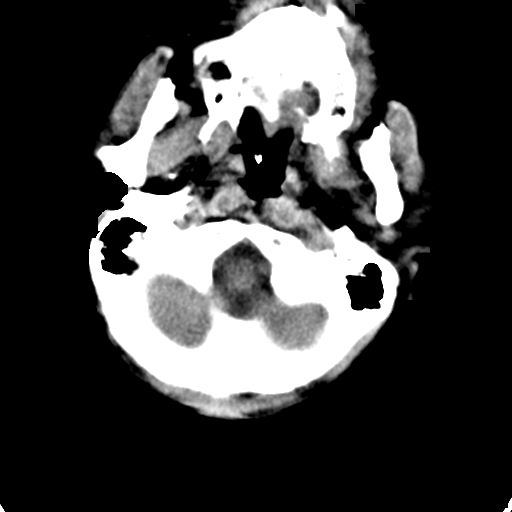
[im 6/31  brain]
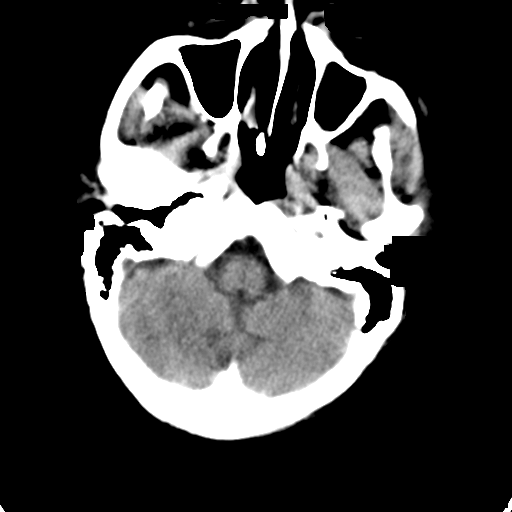
[im 8/31  brain]
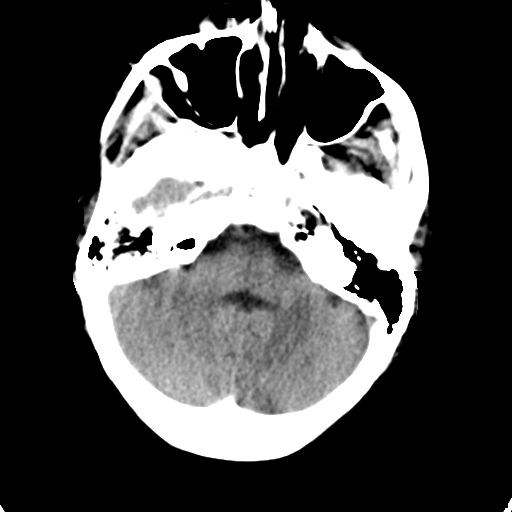
[im 9/31  brain]
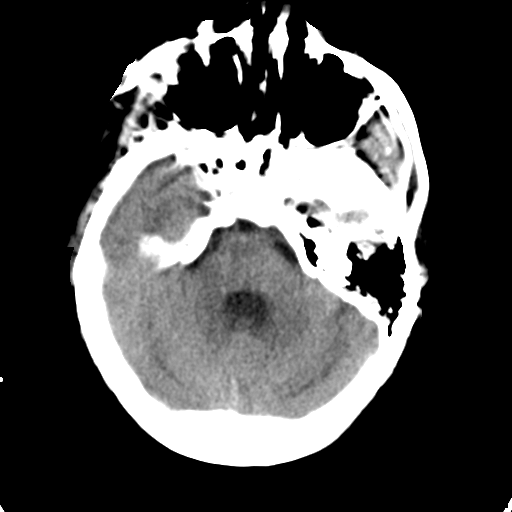
[im 9/31  bone]
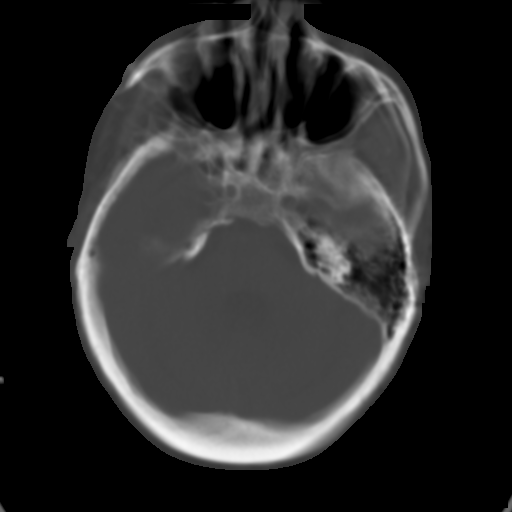
[im 11/31  brain]
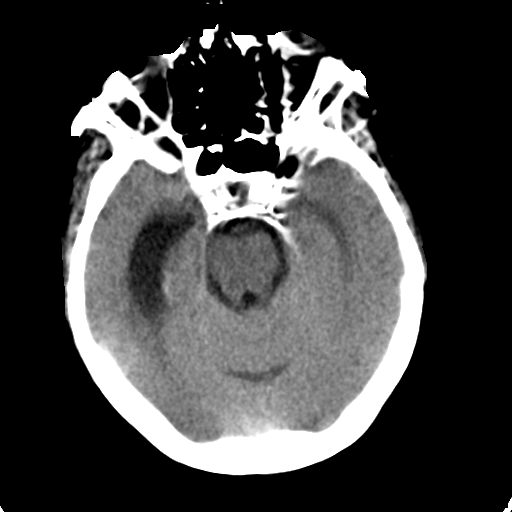
[im 13/31  brain]
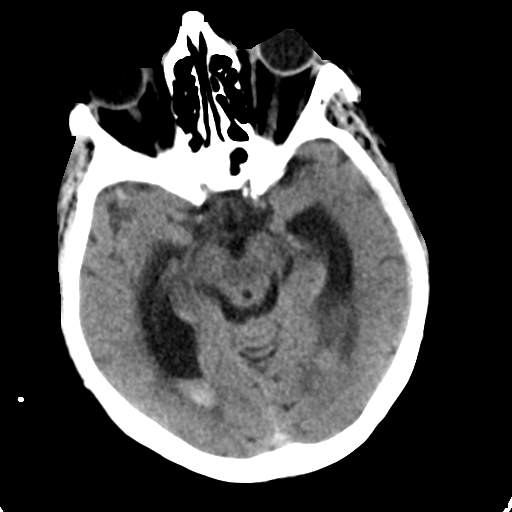
[im 16/31  brain]
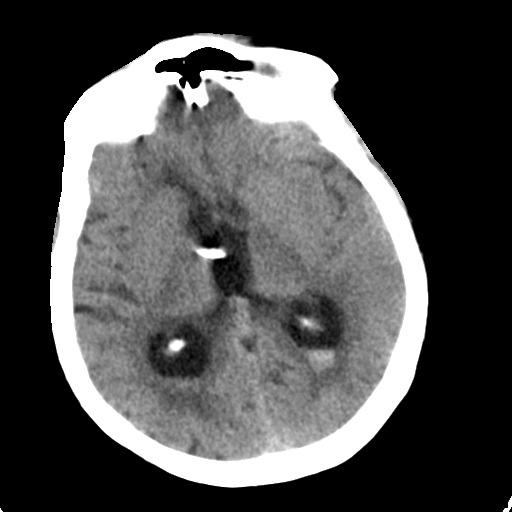
[im 18/31  brain]
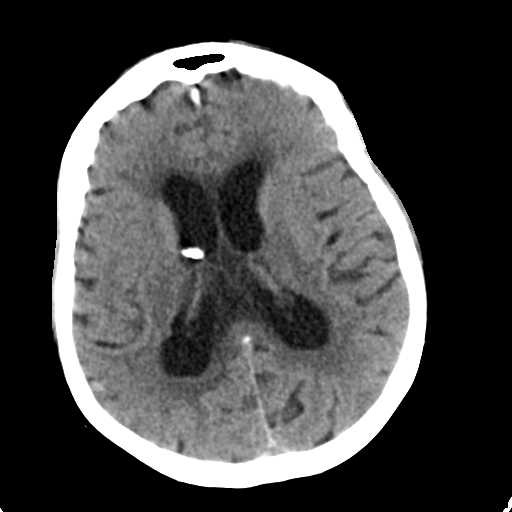
[im 18/31  bone]
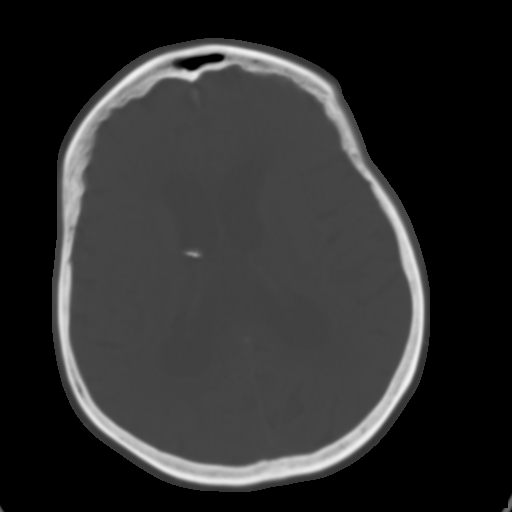
[im 20/31  brain]
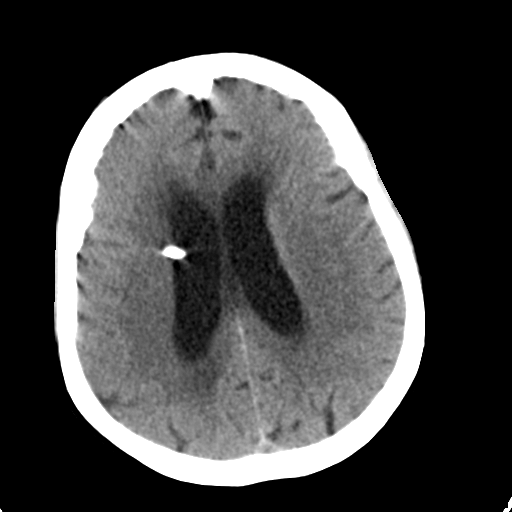
[im 22/31  brain]
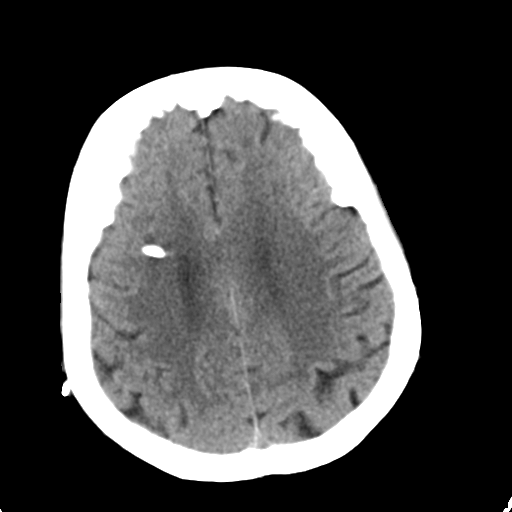
[im 23/31  brain]
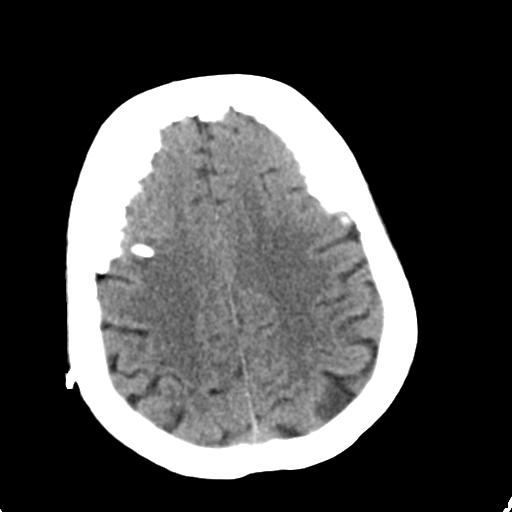
[im 25/31  brain]
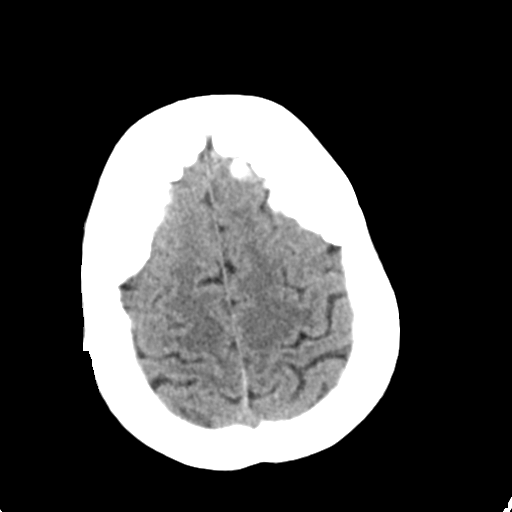
[im 25/31  bone]
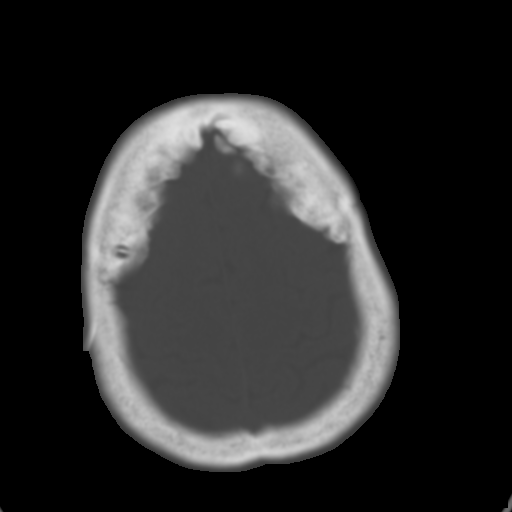
[im 27/31  brain]
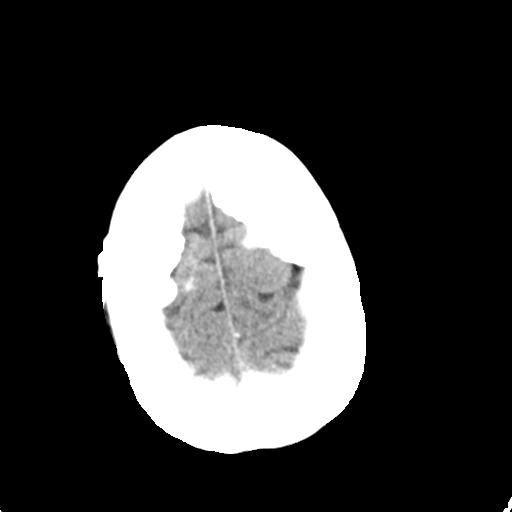
[im 29/31  brain]
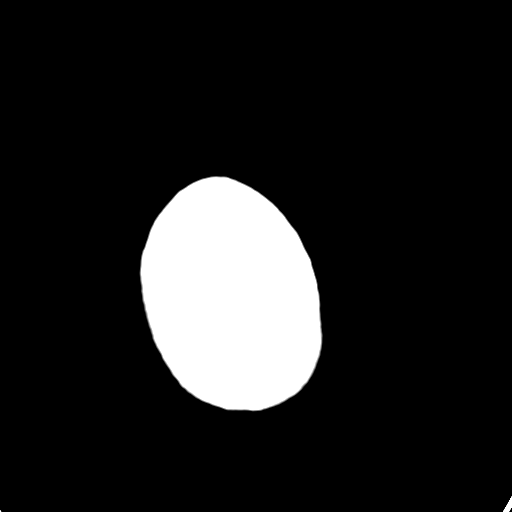

[15 of 30 positions shown; findings below may reference images not displayed]

FINDINGS: Images are mildly to moderately degraded by motion artifact.

Right frontal approach ventriculostomy catheter remains in place
terminating in the third ventricle, unchanged. Dilatation of the
third ventricle has not significantly changed, measuring
approximately 1.5 cm in diameter. Slightly increased dilatation of
the temporal horns of the lateral ventricles is questioned, however
apparent differences may be due to differences in angulation
compared with the prior CT. The temporal horns do not appear
significantly changed in size compared to the 09/03/2014 MRI. Small
amount of blood layering in the occipital horns of lateral
ventricles does not appear significantly changed. Trace subarachnoid
blood is suspected in a right parietal sulcus (series 2, image 18).
There is also likely a small amount of subarachnoid blood in the
posterior aspects of the right greater than left sylvian fissures.
There is no evidence of acute large territory cortical infarct.
There is no midline shift or mass.

Prior bilateral cataract extraction is noted. There is minimal right
and mild left maxillary sinus mucosal thickening. Mild sphenoid
sinus mucosal thickening is also noted. Mastoid air cells are clear.
Carotid siphon calcification is noted. Enteric tube is partially
visualized.
IMPRESSION: 1. Unchanged dilatation of the third ventricle and likely no
significant interval change in dilatation of the lateral ventricles.
2. Unchanged intraventricular blood.
3. Small volume of subarachnoid blood.
